# Patient Record
Sex: Female | Born: 1960
Health system: Southern US, Community
[De-identification: ages and names within clinical notes are randomized; demographics above are authoritative.]

## PROBLEM LIST (undated history)

## (undated) DIAGNOSIS — I10 Essential (primary) hypertension: Secondary | ICD-10-CM

## (undated) DIAGNOSIS — N390 Urinary tract infection, site not specified: Secondary | ICD-10-CM

## (undated) DIAGNOSIS — N649 Disorder of breast, unspecified: Secondary | ICD-10-CM

## (undated) DIAGNOSIS — F419 Anxiety disorder, unspecified: Secondary | ICD-10-CM

## (undated) DIAGNOSIS — E039 Hypothyroidism, unspecified: Secondary | ICD-10-CM

## (undated) DIAGNOSIS — R7401 Elevation of levels of liver transaminase levels: Secondary | ICD-10-CM

## (undated) DIAGNOSIS — E119 Type 2 diabetes mellitus without complications: Secondary | ICD-10-CM

## (undated) DIAGNOSIS — Z8542 Personal history of malignant neoplasm of other parts of uterus: Secondary | ICD-10-CM

## (undated) DIAGNOSIS — R740 Nonspecific elevation of levels of transaminase and lactic acid dehydrogenase [LDH]: Secondary | ICD-10-CM

## (undated) HISTORY — DX: Hypothyroidism, unspecified: E03.9

## (undated) HISTORY — DX: Elevation of levels of liver transaminase levels: R74.01

## (undated) HISTORY — DX: Type 2 diabetes mellitus without complications: E11.9

## (undated) HISTORY — PX: BREAST BIOPSY: SHX20

## (undated) HISTORY — DX: Anxiety disorder, unspecified: F41.9

## (undated) HISTORY — PX: ABDOMINAL HYSTERECTOMY: SHX81

## (undated) HISTORY — DX: Nonspecific elevation of levels of transaminase and lactic acid dehydrogenase (ldh): R74.0

## (undated) HISTORY — DX: Urinary tract infection, site not specified: N39.0

## (undated) HISTORY — DX: Essential (primary) hypertension: I10

## (undated) HISTORY — DX: Disorder of breast, unspecified: N64.9

## (undated) HISTORY — DX: Personal history of malignant neoplasm of other parts of uterus: Z85.42

---

## 2005-10-06 ENCOUNTER — Ambulatory Visit: Payer: Self-pay

## 2005-10-10 ENCOUNTER — Ambulatory Visit: Payer: Self-pay

## 2009-04-10 ENCOUNTER — Ambulatory Visit: Payer: Self-pay | Admitting: Family Medicine

## 2009-04-10 DIAGNOSIS — R259 Unspecified abnormal involuntary movements: Secondary | ICD-10-CM | POA: Insufficient documentation

## 2009-04-10 DIAGNOSIS — R251 Tremor, unspecified: Secondary | ICD-10-CM | POA: Insufficient documentation

## 2009-04-10 LAB — CONVERTED CEMR LAB
Bilirubin Urine: NEGATIVE
Casts: 0 /lpf
Glucose, Urine, Semiquant: NEGATIVE
Ketones, urine, test strip: NEGATIVE
Nitrite: NEGATIVE
Protein, U semiquant: 100
Specific Gravity, Urine: 1.01
Urine crystals, microscopic: 0 /hpf
Urobilinogen, UA: 0.2
WBC Urine, dipstick: NEGATIVE
WBC, UA: 0 cells/hpf
Yeast, UA: 0
pH: 7

## 2009-04-11 ENCOUNTER — Encounter: Payer: Self-pay | Admitting: Family Medicine

## 2009-04-13 ENCOUNTER — Encounter (INDEPENDENT_AMBULATORY_CARE_PROVIDER_SITE_OTHER): Payer: Self-pay | Admitting: *Deleted

## 2009-04-13 LAB — CONVERTED CEMR LAB
ALT: 46 units/L — ABNORMAL HIGH (ref 0–35)
AST: 46 units/L — ABNORMAL HIGH (ref 0–37)
Albumin: 3.5 g/dL (ref 3.5–5.2)
Alkaline Phosphatase: 90 units/L (ref 39–117)
BUN: 8 mg/dL (ref 6–23)
Basophils Absolute: 0.1 10*3/uL (ref 0.0–0.1)
Basophils Relative: 0.6 % (ref 0.0–3.0)
Bilirubin, Direct: 0.1 mg/dL (ref 0.0–0.3)
CO2: 31 meq/L (ref 19–32)
Calcium: 8.9 mg/dL (ref 8.4–10.5)
Chloride: 102 meq/L (ref 96–112)
Cholesterol: 197 mg/dL (ref 0–200)
Creatinine, Ser: 0.7 mg/dL (ref 0.4–1.2)
Eosinophils Absolute: 0.3 10*3/uL (ref 0.0–0.7)
Eosinophils Relative: 3.4 % (ref 0.0–5.0)
GFR calc non Af Amer: 94.07 mL/min (ref >60)
Glucose, Bld: 80 mg/dL (ref 70–99)
HCT: 41.3 % (ref 36.0–46.0)
HDL: 36.2 mg/dL — ABNORMAL LOW (ref >39.00)
Hemoglobin: 14.3 g/dL (ref 12.0–15.0)
LDL Cholesterol: 134 mg/dL — ABNORMAL HIGH (ref 0–99)
Lymphocytes Relative: 30.7 % (ref 12.0–46.0)
Lymphs Abs: 3.1 10*3/uL (ref 0.7–4.0)
MCHC: 34.7 g/dL (ref 30.0–36.0)
MCV: 91.9 fL (ref 78.0–100.0)
Monocytes Absolute: 0.6 10*3/uL (ref 0.1–1.0)
Monocytes Relative: 6.5 % (ref 3.0–12.0)
Neutro Abs: 5.9 10*3/uL (ref 1.4–7.7)
Neutrophils Relative %: 58.8 % (ref 43.0–77.0)
Platelets: 235 10*3/uL (ref 150.0–400.0)
Potassium: 4 meq/L (ref 3.5–5.1)
RBC: 4.5 M/uL (ref 3.87–5.11)
RDW: 13.3 % (ref 11.5–14.6)
Sodium: 138 meq/L (ref 135–145)
TSH: 25.39 microintl units/mL — ABNORMAL HIGH (ref 0.35–5.50)
Total Bilirubin: 1.1 mg/dL (ref 0.3–1.2)
Total CHOL/HDL Ratio: 5
Total Protein: 7.8 g/dL (ref 6.0–8.3)
Triglycerides: 136 mg/dL (ref 0.0–149.0)
VLDL: 27.2 mg/dL (ref 0.0–40.0)
WBC: 10 10*3/uL (ref 4.5–10.5)

## 2009-05-16 ENCOUNTER — Ambulatory Visit: Payer: Self-pay | Admitting: Family Medicine

## 2009-05-16 DIAGNOSIS — R7401 Elevation of levels of liver transaminase levels: Secondary | ICD-10-CM | POA: Insufficient documentation

## 2009-05-16 DIAGNOSIS — R7402 Elevation of levels of lactic acid dehydrogenase (LDH): Secondary | ICD-10-CM | POA: Insufficient documentation

## 2009-05-16 DIAGNOSIS — R740 Nonspecific elevation of levels of transaminase and lactic acid dehydrogenase [LDH]: Secondary | ICD-10-CM

## 2009-05-16 DIAGNOSIS — E039 Hypothyroidism, unspecified: Secondary | ICD-10-CM | POA: Insufficient documentation

## 2009-05-22 ENCOUNTER — Encounter (INDEPENDENT_AMBULATORY_CARE_PROVIDER_SITE_OTHER): Payer: Self-pay | Admitting: *Deleted

## 2009-05-22 LAB — CONVERTED CEMR LAB
ALT: 50 units/L — ABNORMAL HIGH (ref 0–35)
AST: 40 units/L — ABNORMAL HIGH (ref 0–37)
Free T4: 0.9 ng/dL (ref 0.6–1.6)
TSH: 9.64 microintl units/mL — ABNORMAL HIGH (ref 0.35–5.50)

## 2009-06-19 ENCOUNTER — Encounter: Payer: Self-pay | Admitting: Family Medicine

## 2009-06-19 ENCOUNTER — Ambulatory Visit: Payer: Self-pay | Admitting: Family Medicine

## 2009-06-20 ENCOUNTER — Encounter: Payer: Self-pay | Admitting: Family Medicine

## 2009-06-21 ENCOUNTER — Encounter: Payer: Self-pay | Admitting: Family Medicine

## 2009-06-21 ENCOUNTER — Ambulatory Visit: Payer: Self-pay | Admitting: Family Medicine

## 2009-06-27 ENCOUNTER — Telehealth: Payer: Self-pay | Admitting: Family Medicine

## 2009-06-28 ENCOUNTER — Ambulatory Visit: Payer: Self-pay | Admitting: Family Medicine

## 2009-06-29 LAB — CONVERTED CEMR LAB
ALT: 48 units/L — ABNORMAL HIGH (ref 0–35)
AST: 37 units/L (ref 0–37)
Albumin: 3.4 g/dL — ABNORMAL LOW (ref 3.5–5.2)
Alkaline Phosphatase: 76 units/L (ref 39–117)
Bilirubin, Direct: 0.1 mg/dL (ref 0.0–0.3)
Free T4: 1.1 ng/dL (ref 0.6–1.6)
TSH: 4.73 microintl units/mL (ref 0.35–5.50)
Total Bilirubin: 0.8 mg/dL (ref 0.3–1.2)
Total Protein: 7.5 g/dL (ref 6.0–8.3)

## 2009-07-02 ENCOUNTER — Encounter: Admission: RE | Admit: 2009-07-02 | Discharge: 2009-07-02 | Payer: Self-pay | Admitting: Family Medicine

## 2009-07-02 LAB — HM MAMMOGRAPHY

## 2009-07-12 ENCOUNTER — Other Ambulatory Visit: Admission: RE | Admit: 2009-07-12 | Discharge: 2009-07-12 | Payer: Self-pay | Admitting: Family Medicine

## 2009-07-12 ENCOUNTER — Ambulatory Visit: Payer: Self-pay | Admitting: Family Medicine

## 2009-07-12 DIAGNOSIS — I10 Essential (primary) hypertension: Secondary | ICD-10-CM | POA: Insufficient documentation

## 2009-07-12 LAB — HM PAP SMEAR

## 2009-07-23 ENCOUNTER — Encounter (INDEPENDENT_AMBULATORY_CARE_PROVIDER_SITE_OTHER): Payer: Self-pay | Admitting: *Deleted

## 2009-07-23 LAB — CONVERTED CEMR LAB: Pap Smear: NORMAL

## 2009-08-09 ENCOUNTER — Encounter: Admission: RE | Admit: 2009-08-09 | Discharge: 2009-08-09 | Payer: Self-pay | Admitting: Surgery

## 2009-08-09 ENCOUNTER — Ambulatory Visit (HOSPITAL_BASED_OUTPATIENT_CLINIC_OR_DEPARTMENT_OTHER): Admission: RE | Admit: 2009-08-09 | Discharge: 2009-08-09 | Payer: Self-pay | Admitting: Surgery

## 2009-08-23 ENCOUNTER — Ambulatory Visit: Payer: Self-pay | Admitting: Family Medicine

## 2010-01-10 ENCOUNTER — Ambulatory Visit: Payer: Self-pay | Admitting: Family Medicine

## 2010-01-11 LAB — CONVERTED CEMR LAB
ALT: 38 units/L — ABNORMAL HIGH (ref 0–35)
AST: 30 units/L (ref 0–37)
Albumin: 3.8 g/dL (ref 3.5–5.2)
Alkaline Phosphatase: 78 units/L (ref 39–117)
BUN: 13 mg/dL (ref 6–23)
Basophils Absolute: 0.1 10*3/uL (ref 0.0–0.1)
Basophils Relative: 0.6 % (ref 0.0–3.0)
Bilirubin, Direct: 0.2 mg/dL (ref 0.0–0.3)
CO2: 30 meq/L (ref 19–32)
Calcium: 9.4 mg/dL (ref 8.4–10.5)
Chloride: 103 meq/L (ref 96–112)
Cholesterol: 183 mg/dL (ref 0–200)
Creatinine, Ser: 0.8 mg/dL (ref 0.4–1.2)
Eosinophils Absolute: 0.4 10*3/uL (ref 0.0–0.7)
Eosinophils Relative: 3.1 % (ref 0.0–5.0)
GFR calc non Af Amer: 81.56 mL/min (ref >60)
Glucose, Bld: 96 mg/dL (ref 70–99)
HCT: 40.5 % (ref 36.0–46.0)
HDL: 41 mg/dL (ref >39.00)
Hemoglobin: 14 g/dL (ref 12.0–15.0)
LDL Cholesterol: 113 mg/dL — ABNORMAL HIGH (ref 0–99)
Lymphocytes Relative: 31.9 % (ref 12.0–46.0)
Lymphs Abs: 3.6 10*3/uL (ref 0.7–4.0)
MCHC: 34.6 g/dL (ref 30.0–36.0)
MCV: 91.6 fL (ref 78.0–100.0)
Monocytes Absolute: 0.8 10*3/uL (ref 0.1–1.0)
Monocytes Relative: 6.9 % (ref 3.0–12.0)
Neutro Abs: 6.5 10*3/uL (ref 1.4–7.7)
Neutrophils Relative %: 57.5 % (ref 43.0–77.0)
Platelets: 214 10*3/uL (ref 150.0–400.0)
Potassium: 4.1 meq/L (ref 3.5–5.1)
RBC: 4.43 M/uL (ref 3.87–5.11)
RDW: 14.5 % (ref 11.5–14.6)
Sodium: 140 meq/L (ref 135–145)
TSH: 45.52 microintl units/mL — ABNORMAL HIGH (ref 0.35–5.50)
Total Bilirubin: 0.9 mg/dL (ref 0.3–1.2)
Total CHOL/HDL Ratio: 4
Total Protein: 7.5 g/dL (ref 6.0–8.3)
Triglycerides: 145 mg/dL (ref 0.0–149.0)
VLDL: 29 mg/dL (ref 0.0–40.0)
WBC: 11.3 10*3/uL — ABNORMAL HIGH (ref 4.5–10.5)

## 2010-01-15 ENCOUNTER — Encounter: Payer: Self-pay | Admitting: Family Medicine

## 2010-01-23 ENCOUNTER — Ambulatory Visit: Payer: Self-pay | Admitting: Family Medicine

## 2010-01-23 DIAGNOSIS — D72829 Elevated white blood cell count, unspecified: Secondary | ICD-10-CM | POA: Insufficient documentation

## 2010-03-06 ENCOUNTER — Ambulatory Visit: Payer: Self-pay | Admitting: Family Medicine

## 2010-03-07 LAB — CONVERTED CEMR LAB
Basophils Absolute: 0.1 10*3/uL (ref 0.0–0.1)
Basophils Relative: 1.4 % (ref 0.0–3.0)
Eosinophils Absolute: 0.3 10*3/uL (ref 0.0–0.7)
Eosinophils Relative: 2.8 % (ref 0.0–5.0)
Free T4: 0.92 ng/dL (ref 0.60–1.60)
HCT: 40 % (ref 36.0–46.0)
Hemoglobin: 13.8 g/dL (ref 12.0–15.0)
Lymphocytes Relative: 33.7 % (ref 12.0–46.0)
Lymphs Abs: 3 10*3/uL (ref 0.7–4.0)
MCHC: 34.5 g/dL (ref 30.0–36.0)
MCV: 92.5 fL (ref 78.0–100.0)
Monocytes Absolute: 0.6 10*3/uL (ref 0.1–1.0)
Monocytes Relative: 7.2 % (ref 3.0–12.0)
Neutro Abs: 4.9 10*3/uL (ref 1.4–7.7)
Neutrophils Relative %: 54.9 % (ref 43.0–77.0)
Platelets: 230 10*3/uL (ref 150.0–400.0)
RBC: 4.32 M/uL (ref 3.87–5.11)
RDW: 14.1 % (ref 11.5–14.6)
TSH: 14.78 microintl units/mL — ABNORMAL HIGH (ref 0.35–5.50)
WBC: 8.9 10*3/uL (ref 4.5–10.5)

## 2010-04-19 ENCOUNTER — Ambulatory Visit: Payer: Self-pay | Admitting: Family Medicine

## 2010-04-21 LAB — CONVERTED CEMR LAB: TSH: 11 microintl units/mL — ABNORMAL HIGH (ref 0.35–5.50)

## 2010-07-29 ENCOUNTER — Other Ambulatory Visit: Payer: Self-pay | Admitting: Family Medicine

## 2010-07-29 ENCOUNTER — Ambulatory Visit
Admission: RE | Admit: 2010-07-29 | Discharge: 2010-07-29 | Payer: Self-pay | Source: Home / Self Care | Attending: Family Medicine | Admitting: Family Medicine

## 2010-07-29 LAB — T4, FREE: Free T4: 0.9 ng/dL (ref 0.60–1.60)

## 2010-07-29 LAB — TSH: TSH: 10.46 u[IU]/mL — ABNORMAL HIGH (ref 0.35–5.50)

## 2010-08-20 NOTE — Assessment & Plan Note (Signed)
Summary: 6WK F/U FOR BLOOD PRESSURE CHECK / LFW   Vital Signs:  Patient profile:   50 year old female Weight:      238 pounds BMI:     43.69 Temp:     98.1 degrees F oral Pulse rate:   64 / minute Pulse rhythm:   regular BP sitting:   108 / 70  (right arm) Cuff size:   large  Vitals Entered By: Lowella Petties CMA (August 23, 2009 8:24 AM) CC: 6 week follow up   History of Present Illness: here for f/u HTN on lisinopril   bp great today 108/70-- much improved  feels fine   had her breast surgery- benign and glad to have it done  will be able to start exercising again tomorrow not too much pain  no side eff from med is really watching salt in diet   wt down 1 lb bmi 43   no more caffine her tremor is about the same - it does not bother her too much     Allergies: No Known Drug Allergies  Past History:  Family History: Last updated: 05/16/2009 Family History Diabetes (other relative) Family History Hypertension (parent, other relative) mother with hypothyroid   Social History: Last updated: 04/10/2009 Occupation:Shift Manager- at [employer]  Married 2 sons - one is in English as a second language teacher gaurd  2 grandsons  had a daughter - who lived for 6 weeks after birth (due to trauma when she was pregnant with her)  non smoker - briefly smoked at 69  occasional alcohol   Past Medical History: Hx of uti's anxiety  benign breast lesion  Past Surgical History: 2 CS  breast bx and cyst removal 2011- benign  Review of Systems General:  Denies fatigue, loss of appetite, and malaise. Eyes:  Denies blurring and eye pain. CV:  Denies chest pain or discomfort, lightheadness, and palpitations. Resp:  Denies cough, shortness of breath, and wheezing. GI:  Denies abdominal pain, change in bowel habits, and indigestion. GU:  Denies urinary frequency. MS:  Denies joint pain, joint redness, and joint swelling. Derm:  Denies lesion(s), poor wound healing, and rash. Neuro:  Denies  numbness and tingling. Endo:  Denies cold intolerance, excessive thirst, excessive urination, and heat intolerance.  Physical Exam  General:  obese and generally well appearing Head:  normocephalic, atraumatic, and no abnormalities observed.   Eyes:  vision grossly intact, pupils equal, pupils round, and pupils reactive to light.  no conjunctival pallor, injection or icterus  Neck:  supple with full rom and no masses or thyromegally, no JVD or carotid bruit  Lungs:  Normal respiratory effort, chest expands symmetrically. Lungs are clear to auscultation, no crackles or wheezes. Heart:  Normal rate and regular rhythm. S1 and S2 normal without gallop, murmur, click, rub or other extra sounds. Abdomen:  no renal bruits  Msk:  No deformity or scoliosis noted of thoracic or lumbar spine.  no acute joint change  Extremities:  No clubbing, cyanosis, edema, or deformity noted with normal full range of motion of all joints.   Neurologic:  sensation intact to light touch, gait normal, and DTRs symmetrical and normal.   Skin:  Intact without suspicious lesions or rashes lentigos diffusely  Cervical Nodes:  No lymphadenopathy noted Psych:  normal affect, talkative and pleasant    Impression & Recommendations:  Problem # 1:  HYPERTENSION (ICD-401.9) Assessment Improved  much imp with zestril and lifstye change adv to continue both and work on wt loss f/u  6 mo  Her updated medication list for this problem includes:    Zestril 10 Mg Tabs (Lisinopril) .Marland Kitchen... 1 by mouth once daily  BP today: 108/70 Prior BP: 158/100 (07/12/2009)  Labs Reviewed: K+: 4.0 (04/10/2009) Creat: : 0.7 (04/10/2009)   Chol: 197 (04/10/2009)   HDL: 36.20 (04/10/2009)   LDL: 134 (04/10/2009)   TG: 136.0 (04/10/2009)  Complete Medication List: 1)  Synthroid 75 Mcg Tabs (Levothyroxine sodium) .... Take one by mouth daily 2)  Zestril 10 Mg Tabs (Lisinopril) .Marland Kitchen.. 1 by mouth once daily  Patient Instructions: 1)  keep  working on healthy diet and exercise  2)  continue current medicines  3)  follow up with me in july   Prior Medications (reviewed today): SYNTHROID 75 MCG TABS (LEVOTHYROXINE SODIUM) take one by mouth daily ZESTRIL 10 MG TABS (LISINOPRIL) 1 by mouth once daily Current Allergies: No known allergies
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
## 2010-08-20 NOTE — Assessment & Plan Note (Signed)
Summary: F/U   Vital Signs:  Patient profile:   50 year old female Height:      62 inches Weight:      240 pounds BMI:     44.06 Temp:     97.9 degrees F oral Pulse rate:   68 / minute Pulse rhythm:   regular BP sitting:   128 / 86  (left arm) Cuff size:   large  Vitals Entered By: Lewanda Rife LPN (January 23, 453 8:51 AM) CC: follow up visit   History of Present Illness: is visiting father in a rest home after a stroke -- is stressful   in vacation this week feeling pretty good in general   bp is 128/86 and wt is stable   thyroid -- does not feel much of a change -- is a little less hungry than she was  brought her chol down - more veg and less meat  no red or fatty meat  LDL down to 113   wbc is up one point coughs occas - gets a tickle no fever no urine symptoms  ? 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 Eyes:  Denies blurring and eye irritation. CV:  Denies chest pain or discomfort, lightheadness,  palpitations, shortness of breath with exertion, and swelling of feet. Resp:  Denies cough, shortness of breath, and wheezing. GI:  Denies abdominal pain, change in bowel habits, and indigestion. GU:  Denies dysuria and urinary frequency. MS:  Denies muscle aches and cramps. Derm:  Denies itching, lesion(s), poor wound healing, and rash. Neuro:  Denies numbness, tingling, and weakness. Endo:  Denies cold intolerance, excessive thirst, excessive urination, and heat intolerance. Heme:  Denies abnormal bruising and bleeding.  Physical Exam  General:  obese and generally well appearing Head:  normocephalic, atraumatic, and no abnormalities observed.   Eyes:  vision grossly intact, pupils equal, pupils round, and pupils reactive to light.  no conjunctival pallor, injection or icterus  Ears:  R ear normal and L ear normal.   Nose:  no nasal discharge.   Mouth:  pharynx pink and moist.   Neck:  supple with full rom and no masses or thyromegally, no JVD or carotid bruit  Lungs:  Normal respiratory effort, chest expands symmetrically. Lungs are clear to auscultation, no crackles or wheezes. Heart:  Normal rate and regular rhythm. S1 and S2 normal without gallop, murmur, click, rub or other extra sounds. Msk:  No deformity or scoliosis noted of thoracic or lumbar spine.  no acute joint change    Impression &  Recommendations:  Problem # 1:  HYPERTENSION (ICD-401.9) Assessment Unchanged  this is well controlled with zestril -- continue that and work on diet and exercise for wt loss  reviewed labs today f/u 6 mo  Her updated medication list for this problem includes:    Zestril 10 Mg Tabs (Lisinopril) .Marland Kitchen... 1 by mouth once daily  BP today: 128/86 Prior BP: 108/70 (08/23/2009)  Labs Reviewed: K+: 4.1 (01/10/2010) Creat: : 0.8 (01/10/2010)   Chol: 183 (01/10/2010)   HDL: 41.00 (01/10/2010)   LDL: 113 (01/10/2010)   TG: 145.0 (01/10/2010)  Problem # 2:  TRANSAMINASES, SERUM, ELEVATED  (ICD-790.4) this is stable stressed imp of wt loss  Problem # 3:  HYPOTHYROIDISM (ICD-244.9) Assessment: Improved  clinically imp with inc dose  sched labs 6 weeks fu 6 mo  Her updated medication list for this problem includes:    Synthroid 100 Mcg Tabs (Levothyroxine sodium) .Marland Kitchen... Take 1 tablet by mouth once a day  Labs Reviewed: TSH: 45.52 (01/10/2010)    Chol: 183 (01/10/2010)   HDL: 41.00 (01/10/2010)   LDL: 113 (01/10/2010)   TG: 145.0 (01/10/2010)  Problem # 4:  SCREENING FOR LIPOID DISORDERS (ICD-V77.91) Assessment: Unchanged lipids are imp with better diet- commended on this  rev low sat fat diet   Problem # 5:  LEUKOCYTOSIS (ICD-288.60) Assessment: New very mild without symptoms  may be from stress of father with cva  adv to call if fever or any illness at all  re check 6 wk  Complete Medication List: 1)  Zestril 10 Mg Tabs (Lisinopril) .Marland Kitchen.. 1 by mouth once daily 2)  Synthroid 100 Mcg Tabs (Levothyroxine sodium) .... Take 1 tablet by mouth once a day  Patient Instructions: 1)  continue current meds  2)  schedule non fasting labs in 6 weeks tsh/ free T4 and cbc with diff for 244.9 and leukocytosis  3)  follow up in 6 months  4)  work on healthy diet and exercise   Current Allergies (reviewed today): No known allergies

## 2010-08-20 NOTE — Miscellaneous (Signed)
Summary: med list update  Clinical Lists Changes  Medications: Added new medication of SYNTHROID 50 MCG TABS (LEVOTHYROXINE SODIUM) take one by mouth daily     Prior Medications: Current Allergies: No known allergies

## 2010-08-20 NOTE — Miscellaneous (Signed)
Summary: med list update  Clinical Lists Changes  Medications: Changed medication from SYNTHROID 50 MCG TABS (LEVOTHYROXINE SODIUM) take one by mouth daily to SYNTHROID 75 MCG TABS (LEVOTHYROXINE SODIUM) take one by mouth daily     Prior Medications: Current Allergies: No known allergies

## 2010-08-20 NOTE — Miscellaneous (Signed)
Summary: pap results  Clinical Lists Changes  Observations: Added new observation of PAP SMEAR: normal (07/23/2009 8:19)      Preventive Care Screening  Pap Smear:    Date:  07/23/2009    Results:  normal

## 2010-08-20 NOTE — Assessment & Plan Note (Signed)
Summary: 6 week follow upr/bh   Vital Signs:  Patient profile:   50 year old female Weight:      238 pounds BMI:     43.69 Temp:     97.9 degrees F oral Pulse rate:   72 / minute Pulse rhythm:   regular BP sitting:   142 / 100  (left arm) Cuff size:   large  Vitals Entered By: Lowella Petties CMA (June 28, 2009 9:08 AM)  Serial Vital Signs/Assessments:  Time      Position  BP       Pulse  Resp  Temp     By                     130/85                         Judith Part MD  CC: 6 week follow up   History of Present Illness: here for 6 week f/u  at last visit did inc her synthroid for elevated tsh  does not feel any different   wt is down another 3 lb today is still really working on that  is eating healthy  also getting on her elliptical -- is 20 min every other day , and also working very long hours lately    need to re check ast /alt for fatty liver today  has breast bx coming up on monday for ? 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 Eyes:  Denies blurring and eye pain. CV:  Denies chest pain or discomfort, lightheadness, palpitations, and shortness of breath with exertion. Resp:  Denies cough and wheezing. GI:  Denies abdominal pain, change in bowel  habits, and indigestion. MS:  Denies joint pain. Derm:  Denies dryness, itching, lesion(s), poor wound healing, and rash. Neuro:  Denies numbness, tingling, tremors, and weakness. Psych:  mood is ok . Endo:  Denies cold intolerance, excessive thirst, excessive urination, and heat intolerance.  Physical Exam  General:  obese and generally well appearing Head:  normocephalic, atraumatic, and no abnormalities observed.   Eyes:  vision grossly intact, pupils equal, pupils round, and pupils reactive to light.  no conjunctival pallor, injection or icterus  Neck:  supple with full rom and no masses or thyromegally, no JVD or carotid bruit  Lungs:  Normal respiratory effort, chest expands symmetrically. Lungs are clear to auscultation, no crackles or wheezes. Heart:  Normal rate and regular rhythm. S1 and S2 normal without gallop, murmur, click, rub or other extra sounds. Msk:  No deformity or scoliosis noted of thoracic or lumbar spine.  no acute joint changes  Extremities:  No clubbing, cyanosis, edema, or deformity noted with normal full range of motion of  all joints.   Neurologic:  sensation intact to light touch, gait normal, and DTRs symmetrical and normal.  no tremor noted today Skin:  Intact without suspicious lesions or rashes Cervical Nodes:  No lymphadenopathy noted Psych:  normal affect, talkative and pleasant    Impression & Recommendations:  Problem # 1:  HYPOTHYROIDISM (ICD-244.9) Assessment Unchanged  with no clinical changes on inc dose  tsh / Ft4 today and update  Her updated medication list for this problem includes:    Synthroid 75 Mcg Tabs (Levothyroxine sodium) .Marland Kitchen... Take one by mouth daily  Orders: Venipuncture (04540) TLB-TSH (Thyroid Stimulating Hormone) (84443-TSH) TLB-T4 (Thyrox), Free [id number])  Labs Reviewed: TSH: 9.64 (05/16/2009)    Chol: 197 (04/10/2009)   HDL: 36.20 (04/10/2009)   LDL: 134 (04/10/2009)   TG: 136.0 (04/10/2009)  Problem # 2:   MAMMOGRAM, ABNORMAL (ICD-793.80) Assessment: Comment Only for bx on monday- pt is doing ok and has good support no palp abn  will disc at f/u  Problem # 3:  TRANSAMINASES, SERUM, ELEVATED (ICD-790.4) Assessment: Unchanged from fatty liver 3 more pounds lost today urged to keep up low fat diet and exercise  hepatic lab today and udate to disc further at upcoming f/u Orders: Venipuncture (29562) TLB-Hepatic/Liver Function Pnl (80076-HEPATIC)  Complete Medication List: 1)  Synthroid 75 Mcg Tabs (Levothyroxine sodium) .... Take one by mouth daily  Patient Instructions: 1)  labs today for thyroid and also liver function  2)  keep working on diet and exercise and weight loss  3)  good luck with your breast biopsy - I think you will do great   Prior Medications (reviewed today): SYNTHROID 75 MCG TABS (LEVOTHYROXINE SODIUM) take one by mouth daily Current Allergies: No known allergies

## 2010-08-20 NOTE — Assessment & Plan Note (Signed)
Summary: 30 MIN APPT 2-4 WEEK FOLLOW UP/[facility]   Vital Signs:  Patient profile:   50 year old female Weight:      241 pounds BMI:     44.24 Temp:     97.9 degrees F oral Pulse rate:   64 / minute Pulse rhythm:   regular BP sitting:   144 / 84  (left arm) Cuff size:   large  Vitals Entered By: Lowella Petties CMA (May 16, 2009 11:35 AM)  Serial Vital Signs/Assessments:  Time      Position  BP       Pulse  Resp  Temp     By                     130/80                         Judith Part MD  Comments: large cuff By: Judith Part MD   CC: 2-4 month follow up  9  History of Present Illness: here for f/u and health mt exam  feeling pretty good   wt is down 3 lb- is really watching diet  is also off sugar drinks and has more water   last labs showed hypothyroidism- with elevated tsh  started synthroid 50 micrograms overall feels like she has more energy tremor is also a little better  wt is down a bit   bp 144/84 today- up some   tx for uti last visit with septra urine symptoms - are totally gone now  no problems with abx   lipids very mildly high with trig 136/ HDL 36 and LDL 134 diet is overall improved  has been working on elliptical machine -- and enjoys it  ast/alt were elevated slt  no tylenol takes occ aleve - not often  occ alcohol- not regular   mammogram- just had one last year -- is due for another  has not noticed any breast lumps   pap- long time ago -- wants to do that at a different time  menses- are infrequent at this point - almost menopausal   Td-- was 2 years ago  flu shot - declines- "makes her sick"-- vomiting   colon cancer screen - not ready for colonoscopy yet  also does not want to do stool card     Allergies: No Known Drug Allergies  Past History:  Past Medical History: Last updated: 04/10/2009 Hx of uti's anxiety   Past Surgical History: Last updated: 04/10/2009 2 CS   Family History: Last updated:  05/16/2009 Family History Diabetes (other relative) Family History Hypertension (parent, other relative) mother with hypothyroid   Social History: Last updated: 04/10/2009 Occupation:Shift Manager- at [employer]  Married 2 sons - one is in English as a second language teacher gaurd  2 grandsons  had a daughter - who lived for 6 weeks after birth (due to trauma when she was pregnant with her)  non smoker - briefly smoked at 55  occasional alcohol   Family History: Family History Diabetes (other relative) Family History Hypertension (parent, other relative) mother with hypothyroid   Review of Systems General:  Complains of fatigue; denies loss of appetite and malaise; fatigue is improved . Eyes:  Denies blurring and eye pain. CV:  Denies chest pain or discomfort, palpitations, and shortness of breath with exertion. Resp:  Denies cough, shortness of breath, and wheezing. GI:  Denies abdominal pain, change in bowel habits, nausea, and vomiting. GU:  Denies abnormal vaginal bleeding, discharge, dysuria, and urinary frequency. MS:  Denies joint pain, cramps, muscle weakness, and stiffness. Derm:  Denies itching, lesion(s), poor wound healing, and rash. Neuro:  Denies numbness and tingling. Psych:  mood is good . Endo:  Denies cold intolerance, excessive thirst, excessive urination, and heat intolerance. Heme:  Denies abnormal bruising and bleeding.  Physical Exam  General:  obese and generally well appearing Head:  normocephalic, atraumatic, and no abnormalities observed.   Eyes:  vision grossly intact, pupils equal, pupils round, and pupils reactive to light.  no conjunctival pallor, injection or icterus  Ears:  R ear normal and L ear normal.   Nose:  no nasal discharge.   Mouth:  pharynx pink and moist.   Neck:  supple with full rom and no masses or thyromegally, no JVD or carotid bruit  Chest Wall:  No deformities, masses, or tenderness noted. Breasts:  No mass, nodules, thickening, tenderness, bulging,  retraction, inflamation, nipple discharge or skin changes noted.   Lungs:  Normal respiratory effort, chest expands symmetrically. Lungs are clear to auscultation, no crackles or wheezes. Heart:  Normal rate and regular rhythm. S1 and S2 normal without gallop, murmur, click, rub or other extra sounds. Abdomen:  Bowel sounds positive,abdomen soft and non-tender without masses, organomegaly or hernias noted. no renal bruits  Msk:  No deformity or scoliosis noted of thoracic or lumbar spine.  no acute joint changes  Pulses:  R and L carotid,radial,femoral,dorsalis pedis and posterior tibial pulses are full and equal bilaterally Extremities:  No clubbing, cyanosis, edema, or deformity noted with normal full range of motion of all joints.   Neurologic:  sensation intact to light touch, gait normal, and DTRs symmetrical and normal.  no tremor noted today Skin:  Intact without suspicious lesions or rashes many sks and skin tags  Cervical Nodes:  No lymphadenopathy noted Axillary Nodes:  No palpable lymphadenopathy Inguinal Nodes:  No significant adenopathy Psych:  normal affect, talkative and pleasant    Impression & Recommendations:  Problem # 1:  HEALTH MAINTENANCE EXAM (ICD-V70.0) Assessment Comment Only reviewed health habits including diet, exercise and skin cancer prevention reviewed health maintenance list and family history commended on beginning wt loss effort rev labs and lipids  breast exam today  pt chooses to def pap for another time  Problem # 2:  TRANSAMINASES, SERUM, ELEVATED (ICD-790.4) Assessment: New mild - likely rel to wt gain/ poss fatty liver re check today with better diet and 3 lb wt loss if inc - consider [place]  disc avoid otc med and alcohol Orders: Venipuncture (63875) TLB-ALT (SGPT) (84460-ALT) TLB-AST (SGOT) (84450-SGOT)  Problem # 3:  HYPOTHYROIDISM (ICD-244.9) Assessment: New new- and improved energy level with small dose of synthroid re check today and  adj dose as needed  no goiter - will continue to follow Orders: Radiology Referral (Radiology)  Her updated medication list for this problem includes:    Synthroid 50 Mcg Tabs (Levothyroxine sodium) .Marland Kitchen... Take one by mouth daily  Problem # 4:  OTHER SCREENING MAMMOGRAM (ICD-V76.12) Assessment: Comment Only annual mammogram scheduled adv pt to continue regular self breast exams non remarkable breast exam today  Orders: Radiology Referral (Radiology)  Complete Medication List: 1)  Synthroid 50 Mcg Tabs (Levothyroxine sodium) .... Take one by mouth daily  Other Orders: TLB-TSH (Thyroid Stimulating Hormone) (84443-TSH) TLB-T4 (Thyrox), Free [phone])  Preventive Care Screening  Last Tetanus Booster:    Date:  04/21/2007    Results:  Td  Contraindications of Treatment or Deferment of Test/Procedure:    Treatment: Flu Shot    Contraindication: adverse rxn/side effects     Test/Procedure: Colonoscopy    Reason for deferment: patient declined     flu shot makes her sick  declines colonoscopy- may consider later, also declines stool card    Patient Instructions: 1)  labs today for thyroid- will let you know if we need to adjust dose  2)  also re checking liver tests -- keep working on weight loss and low fat diet  3)  we will schedule mammogram at check out    Preventive Care Screening  Last Tetanus Booster:    Date:  04/21/2007    Results:  Td   Contraindications of Treatment or Deferment of Test/Procedure:    Treatment: Flu Shot    Contraindication: adverse rxn/side effects     Test/Procedure: Colonoscopy    Reason for deferment: patient declined     flu shot makes her sick  declines colonoscopy- may consider later, also declines stool card

## 2010-08-20 NOTE — Miscellaneous (Signed)
Summary: Synthroid rx and update to med list  Clinical Lists Changes  Medications: Added new medication of SYNTHROID 100 MCG TABS (LEVOTHYROXINE SODIUM) Take 1 tablet by mouth once a day - Signed Removed medication of SYNTHROID 75 MCG TABS (LEVOTHYROXINE SODIUM) take one by mouth daily Rx of SYNTHROID 100 MCG TABS (LEVOTHYROXINE SODIUM) Take 1 tablet by mouth once a day;  #30 x 5;  Signed;  Entered by: Lewanda Rife LPN;  Authorized by: Judith Part MD;  Method used: Electronically to [facility] [phone]*, [address] [place], [place]  [address], Ph: [phone], Fax: [phone]    Prescriptions: SYNTHROID 100 MCG TABS (LEVOTHYROXINE SODIUM) Take 1 tablet by mouth once a day  #30 x 5   Entered by:   Lewanda Rife LPN   Authorized by:   Judith Part MD   Signed by:   Lewanda Rife LPN on 30/16/0109   Method used:   Electronically to        [facility] [phone]* (retail)       [address] [place], [place]  [address]       Ph: [phone]       Fax: [phone]   RxID:   [id number]  Pt has appt with Dr Milinda Antis already scheduled on 01/23/10 at 9am. Pt will schedule f/u TSH and free T4 in 6 weeks when she comes in office on 01/23/10. Pt is aware of medication change.Lewanda Rife LPN  January 15, 2010 10:01 AM  Current Allergies: No known allergies

## 2010-08-22 NOTE — Assessment & Plan Note (Signed)
Summary: 6 MONTH FOLLOW UPR/BH   Vital Signs:  Patient profile:   50 year old female Height:      62 inches Weight:      248.25 pounds BMI:     45.57 Temp:     97.7 degrees F oral Pulse rate:   76 / minute Pulse rhythm:   regular BP sitting:   120 / 78  (left arm) Cuff size:   large  Vitals Entered By: Lewanda Rife LPN (July 29, 2010 9:15 AM) CC: six month f/u   History of Present Illness: here for f/u of hypothyroid and also HTN and also having a cough -- night or day - dry and hacky like a tickle  no other uri symptoms  from ace?    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   Allergies (verified): No Known Drug Allergies  Past History:  Past Surgical History: Last updated: 08/23/2009 2 CS  breast bx and cyst removal 12-09-09- benign  Family History: Last updated: 07/29/2010 Family History Diabetes (other relative) Family History Hypertension (parent, other relative) mother with hypothyroid and alzheimers  father with stroke , died 2009-12-09  Social History: Last updated: 04/10/2009 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 fatty liver   Family History: Family History Diabetes (other relative) Family History  Hypertension (parent, other relative) mother with hypothyroid and alzheimers  father with stroke , died 12-09-09  Review of Systems General:  Complains of fatigue; denies fever, loss of appetite, and malaise. Eyes:  Denies blurring and eye irritation. CV:  Denies chest pain or discomfort, lightheadness, and palpitations. Resp:  Complains of cough; denies pleuritic, shortness of breath, sputum productive, and wheezing. GI:  Denies abdominal pain, change in bowel habits, and indigestion. GU:  Denies urinary frequency. MS:  Denies joint pain. Derm:  Denies dryness, itching, lesion(s), and rash. Neuro:  Denies headaches, numbness, and tingling. Psych:  Denies anxiety and depression; is handling grief ok overall. Endo:  Denies cold intolerance, excessive thirst, excessive urination, and heat intolerance. Heme:  Denies abnormal bruising and bleeding.  Physical Exam  General:  obese and generally well appearing Head:  normocephalic, atraumatic, and no abnormalities observed.   Eyes:  vision grossly intact, pupils equal, pupils round, and pupils reactive to light.  no conjunctival pallor, injection or icterus  Mouth:  pharynx pink and moist.   Neck:  supple with full rom and no masses or thyromegally, no JVD or carotid bruit  Chest Wall:  No deformities, masses, or tenderness noted. Lungs:  Normal respiratory effort, chest expands  symmetrically. Lungs are clear to auscultation, no crackles or wheezes. Heart:  Normal rate and regular rhythm. S1 and S2 normal without gallop, murmur, click, rub or other extra sounds. Msk:  No deformity or scoliosis noted of thoracic or lumbar spine.  no acute joint change  Extremities:  trace pedal edema with sock line Neurologic:  sensation intact to light touch, gait normal, and DTRs symmetrical and normal.  no tremor  Skin:  Intact without suspicious lesions or rashes Cervical Nodes:  No lymphadenopathy noted Psych:  nl affect     Impression &  Recommendations:  Problem # 1:  HYPOTHYROIDISM (ICD-244.9) Assessment Unchanged  some wt gain and fatigue- but that may be grief related  check tsh and free T4 then adj dose if needed  Her updated medication list for this problem includes:    Synthroid 125 Mcg Tabs (Levothyroxine sodium) .Marland Kitchen... 1 by mouth once daily  Orders: Venipuncture (13086) TLB-TSH (Thyroid Stimulating Hormone) (84443-TSH) TLB-T4 (Thyrox), Free [phone]) Prescription Created Electronically [phone])  Labs Reviewed: TSH: 11.00 (04/19/2010)    Chol: 183 (01/10/2010)   HDL: 41.00 (01/10/2010)   LDL: 113 (01/10/2010)   TG: 145.0 (01/10/2010)  Problem # 2:  HYPERTENSION (ICD-401.9) Assessment: Deteriorated  likely ace cough change to cozaar  update if not improved  nl exam today The following medications were removed from the medication list:    Zestril 10 Mg Tabs (Lisinopril) .Marland Kitchen... 1 by mouth once daily Her updated medication list for this problem includes:    Cozaar 50 Mg Tabs (Losartan potassium) .Marland Kitchen... 1 by mouth once daily  BP today: 120/78 Prior BP: 128/86 (01/23/2010)  Labs Reviewed: K+: 4.1 (01/10/2010) Creat: : 0.8 (01/10/2010)   Chol: 183 (01/10/2010)   HDL: 41.00 (01/10/2010)   LDL: 113 (01/10/2010)   TG: 145.0 (01/10/2010)  Orders: Prescription Created Electronically [id number])  Complete Medication List: 1)  Synthroid 125 Mcg Tabs (Levothyroxine sodium) .Marland Kitchen.. 1 by mouth once daily 2)  Cozaar 50 Mg Tabs (Losartan potassium) .Marland Kitchen.. 1 by mouth once daily  Patient Instructions: 1)  stop the zestril- it may be causing cough  2)  start cozaar instead - let me know if cough does not improve  3)  checking thyroid today- will update you if we need to change dose  4)  start exercise 30 min -- sat, sun and two weekdays 5)  get back on healthy eating plan  6)  follow up in about 3 months  Prescriptions: COZAAR 50 MG TABS (LOSARTAN POTASSIUM) 1 by mouth once daily  #30 x 11   Entered and Authorized by:    Judith Part MD   Signed by:   Judith Part MD on 07/29/2010   Method used:   Electronically to        [facility] [phone]* (retail)       [address] [place], [place]  [address]       Ph: [phone]       Fax: [phone]   RxID:   [id number]    Orders Added: 1)  Venipuncture [36415] 2)  TLB-TSH (Thyroid Stimulating Hormone) [84443-TSH] 3)  TLB-T4 (Thyrox), Free [01093-AT5T] 4)  Prescription Created Electronically [[id number]] 5)  Est. Patient Level III [73220]    Current Allergies (reviewed today): No known allergies

## 2010-09-11 ENCOUNTER — Encounter: Payer: Self-pay | Admitting: Family Medicine

## 2010-09-23 ENCOUNTER — Other Ambulatory Visit: Payer: Self-pay | Admitting: Family Medicine

## 2010-09-23 ENCOUNTER — Encounter (INDEPENDENT_AMBULATORY_CARE_PROVIDER_SITE_OTHER): Payer: Self-pay | Admitting: *Deleted

## 2010-09-23 ENCOUNTER — Other Ambulatory Visit (INDEPENDENT_AMBULATORY_CARE_PROVIDER_SITE_OTHER): Payer: PRIVATE HEALTH INSURANCE

## 2010-09-23 DIAGNOSIS — E039 Hypothyroidism, unspecified: Secondary | ICD-10-CM

## 2010-09-23 LAB — TSH: TSH: 15.78 u[IU]/mL — ABNORMAL HIGH (ref 0.35–5.50)

## 2010-09-23 LAB — T4, FREE: Free T4: 0.73 ng/dL (ref 0.60–1.60)

## 2010-10-07 LAB — BASIC METABOLIC PANEL
BUN: 8 mg/dL (ref 6–23)
CO2: 31 mEq/L (ref 19–32)
Calcium: 8.9 mg/dL (ref 8.4–10.5)
Chloride: 102 mEq/L (ref 96–112)
Creatinine, Ser: 0.64 mg/dL (ref 0.4–1.2)
GFR calc Af Amer: >60 mL/min (ref >60)
GFR calc non Af Amer: >60 mL/min (ref >60)
Glucose, Bld: 155 mg/dL — ABNORMAL HIGH (ref 70–99)
Potassium: 4.3 mEq/L (ref 3.5–5.1)
Sodium: 139 mEq/L (ref 135–145)

## 2010-10-07 LAB — POCT HEMOGLOBIN-HEMACUE: Hemoglobin: 14.6 g/dL (ref 12.0–15.0)

## 2010-10-29 ENCOUNTER — Ambulatory Visit: Payer: Self-pay | Admitting: Family Medicine

## 2010-10-30 ENCOUNTER — Ambulatory Visit: Payer: Self-pay | Admitting: Family Medicine

## 2010-11-05 ENCOUNTER — Ambulatory Visit: Payer: Self-pay | Admitting: Family Medicine

## 2010-11-12 ENCOUNTER — Encounter: Payer: Self-pay | Admitting: Family Medicine

## 2010-11-12 ENCOUNTER — Ambulatory Visit (INDEPENDENT_AMBULATORY_CARE_PROVIDER_SITE_OTHER): Payer: PRIVATE HEALTH INSURANCE | Admitting: Family Medicine

## 2010-11-12 VITALS — BP 130/78 | HR 72 | Temp 97.7°F | Ht 62.0 in | Wt 249.5 lb

## 2010-11-12 DIAGNOSIS — E039 Hypothyroidism, unspecified: Secondary | ICD-10-CM

## 2010-11-12 DIAGNOSIS — E66811 Obesity, class 1: Secondary | ICD-10-CM | POA: Insufficient documentation

## 2010-11-12 DIAGNOSIS — E6609 Other obesity due to excess calories: Secondary | ICD-10-CM | POA: Insufficient documentation

## 2010-11-12 DIAGNOSIS — Z6836 Body mass index (BMI) 36.0-36.9, adult: Secondary | ICD-10-CM | POA: Insufficient documentation

## 2010-11-12 DIAGNOSIS — E669 Obesity, unspecified: Secondary | ICD-10-CM

## 2010-11-12 LAB — TSH: TSH: 12.47 u[IU]/mL — ABNORMAL HIGH (ref 0.35–5.50)

## 2010-11-12 LAB — T4, FREE: Free T4: 0.87 ng/dL (ref 0.60–1.60)

## 2010-11-12 NOTE — Patient Instructions (Signed)
Checking your thyroid today  Will get you results and let you know what you need to do with your thyroid dose Try to eat healthy diet (Avoid red meat/ fried foods/ egg yolks/ fatty breakfast meats/ butter, cheese and high fat dairy/ and shellfish  )  Try to exercise 5 days per week if you can for weight loss

## 2010-11-12 NOTE — Assessment & Plan Note (Signed)
Stressed imp of wt loss with healthier diet and exercise (even when busy or stressed)

## 2010-11-12 NOTE — Assessment & Plan Note (Signed)
Last tsh quite high Re check this with free t4 today after inc in dose Some inc in energy - no other changes clinically Will likley need to inc again  Stressed imp of compliance No goiter noted

## 2010-11-12 NOTE — Progress Notes (Signed)
  Subjective:    Patient ID: Taylor Grimes, female    DOB: May 16, 1961, 50 y.o.   MRN: [id number]  HPI Here for f/u of hypothyroidism 1 mo ago tsh was over 15 and free T4 was low -so inc her synthroid to 150 mcg daily Feels about the same  Poss a little more energy No skin or hair changes No goiter or thyroid gland change   Wt is stable High bmi of 45 Has got to "get away from food"  Works at [employer]    HTN in good control 130/78  Has been feeling pretty good  Still down -- over father's death and mother was in the hospital -- and she is doing better now   Past Medical History  Diagnosis Date  . UTI (urinary tract infection)   . Anxiety   . Breast lesion     benign  . Hypothyroid   . HTN (hypertension)   . Elevated transaminase level     ? fatty liver    Past Surgical History  Procedure Date  . Breast biopsy     benign cyst 2011    reports that she quit smoking about 31 years ago. She does not have any smokeless tobacco history on file. She reports that she drinks alcohol. Her drug history not on file. family history includes Alzheimer's disease in her mother; Hypothyroidism in her mother; and Stroke in her father. No Known Allergies      Review of Systems Review of Systems  Constitutional: Negative for fever, appetite change, fatigue and unexpected weight change.  Eyes: Negative for pain and visual disturbance.  Respiratory: Negative for cough and shortness of breath.   Cardiovascular: Negative.   Gastrointestinal: Negative for nausea, diarrhea and constipation.  Genitourinary: Negative for urgency and frequency.  Skin: Negative for pallor.  Neurological: Negative for weakness, light-headedness, numbness and headaches.  Hematological: Negative for adenopathy. Does not bruise/bleed easily.  Psychiatric/Behavioral: Negative for dysphoric mood. The patient is not nervous/anxious.          Objective:   Physical Exam  Constitutional: She appears well-developed  and well-nourished.       Obese and well appearing   HENT:  Head: Normocephalic and atraumatic.  Eyes: Conjunctivae and EOM are normal. Pupils are equal, round, and reactive to light.  Neck: Normal range of motion. Neck supple. No JVD present. No thyromegaly present.  Cardiovascular: Normal rate, regular rhythm and normal heart sounds.   Pulmonary/Chest: Effort normal and breath sounds normal. She has no wheezes.  Abdominal: Soft. Bowel sounds are normal.  Musculoskeletal: She exhibits no edema and no tenderness.  Lymphadenopathy:    She has no cervical adenopathy.  Neurological: She is alert. She has normal reflexes. She exhibits normal muscle tone. Coordination normal.       No tremor   Skin: Skin is warm and dry. No rash noted. No erythema. No pallor.  Psychiatric: She has a normal mood and affect.          Assessment & Plan:

## 2010-11-14 MED ORDER — LEVOTHYROXINE SODIUM 175 MCG PO TABS: 175.0000 ug | ORAL_TABLET | Freq: Every day | ORAL | Status: DC

## 2010-11-14 NOTE — Progress Notes (Signed)
Addended by: Roxy Manns on: 11/14/2010 03:48 PM   Modules accepted: Orders

## 2010-11-15 ENCOUNTER — Other Ambulatory Visit: Payer: Self-pay | Admitting: *Deleted

## 2010-12-26 ENCOUNTER — Other Ambulatory Visit: Payer: Self-pay | Admitting: Family Medicine

## 2010-12-26 DIAGNOSIS — E039 Hypothyroidism, unspecified: Secondary | ICD-10-CM

## 2010-12-31 ENCOUNTER — Other Ambulatory Visit (INDEPENDENT_AMBULATORY_CARE_PROVIDER_SITE_OTHER): Payer: PRIVATE HEALTH INSURANCE | Admitting: Family Medicine

## 2010-12-31 DIAGNOSIS — E039 Hypothyroidism, unspecified: Secondary | ICD-10-CM

## 2010-12-31 LAB — TSH: TSH: 4.06 u[IU]/mL (ref 0.35–5.50)

## 2011-04-08 ENCOUNTER — Other Ambulatory Visit (INDEPENDENT_AMBULATORY_CARE_PROVIDER_SITE_OTHER): Payer: PRIVATE HEALTH INSURANCE

## 2011-04-08 DIAGNOSIS — E039 Hypothyroidism, unspecified: Secondary | ICD-10-CM

## 2011-04-08 LAB — TSH: TSH: 9.36 u[IU]/mL — ABNORMAL HIGH (ref 0.35–5.50)

## 2011-04-15 ENCOUNTER — Telehealth: Payer: Self-pay

## 2011-04-15 NOTE — Telephone Encounter (Signed)
Left vm for pt to callback 

## 2011-04-15 NOTE — Progress Notes (Signed)
Quick Note:  Left v/m for pt to call back. ______

## 2011-04-15 NOTE — Telephone Encounter (Signed)
Message copied by Patience Musca on Tue Apr 15, 2011  6:03 PM ------      Message from: Roxy Manns A      Created: Mon Apr 14, 2011  8:42 PM       Be compliant with med and re check tsh in 2 weeks please for hypothyroid

## 2011-04-16 IMAGING — MG MAM DGTL SCREENING MAMMO W/CAD
1 series · 4 of 4 positions shown · non-contrast
Comparison: none

REASON FOR EXAM: scr mammo
COMMENTS:

[R CC · right · 4 of 7 slices shown]
[im 1/7]
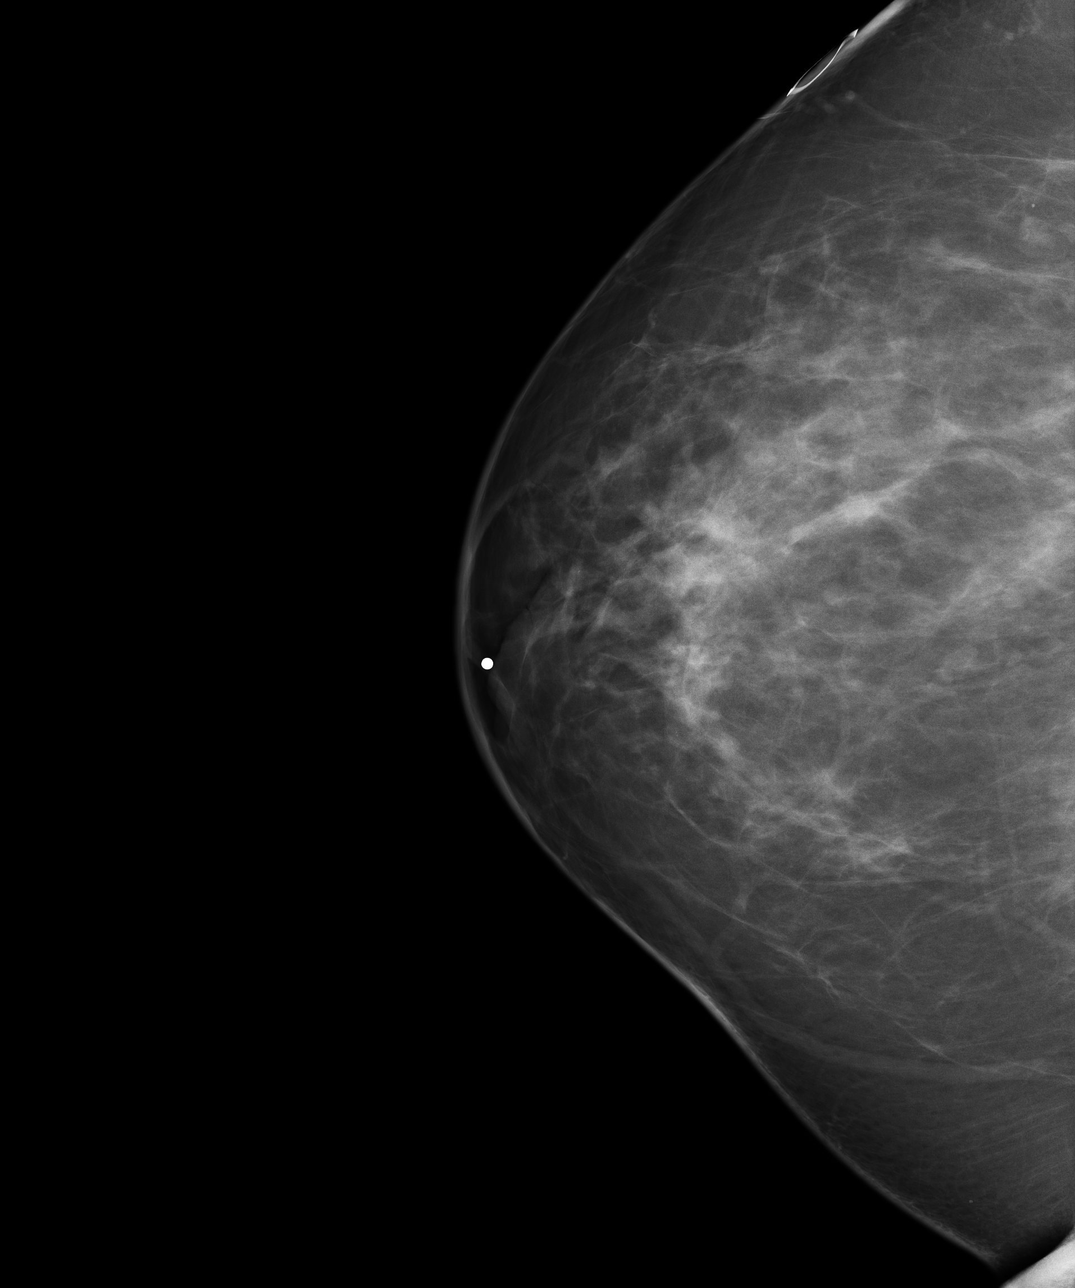
[im 3/7]
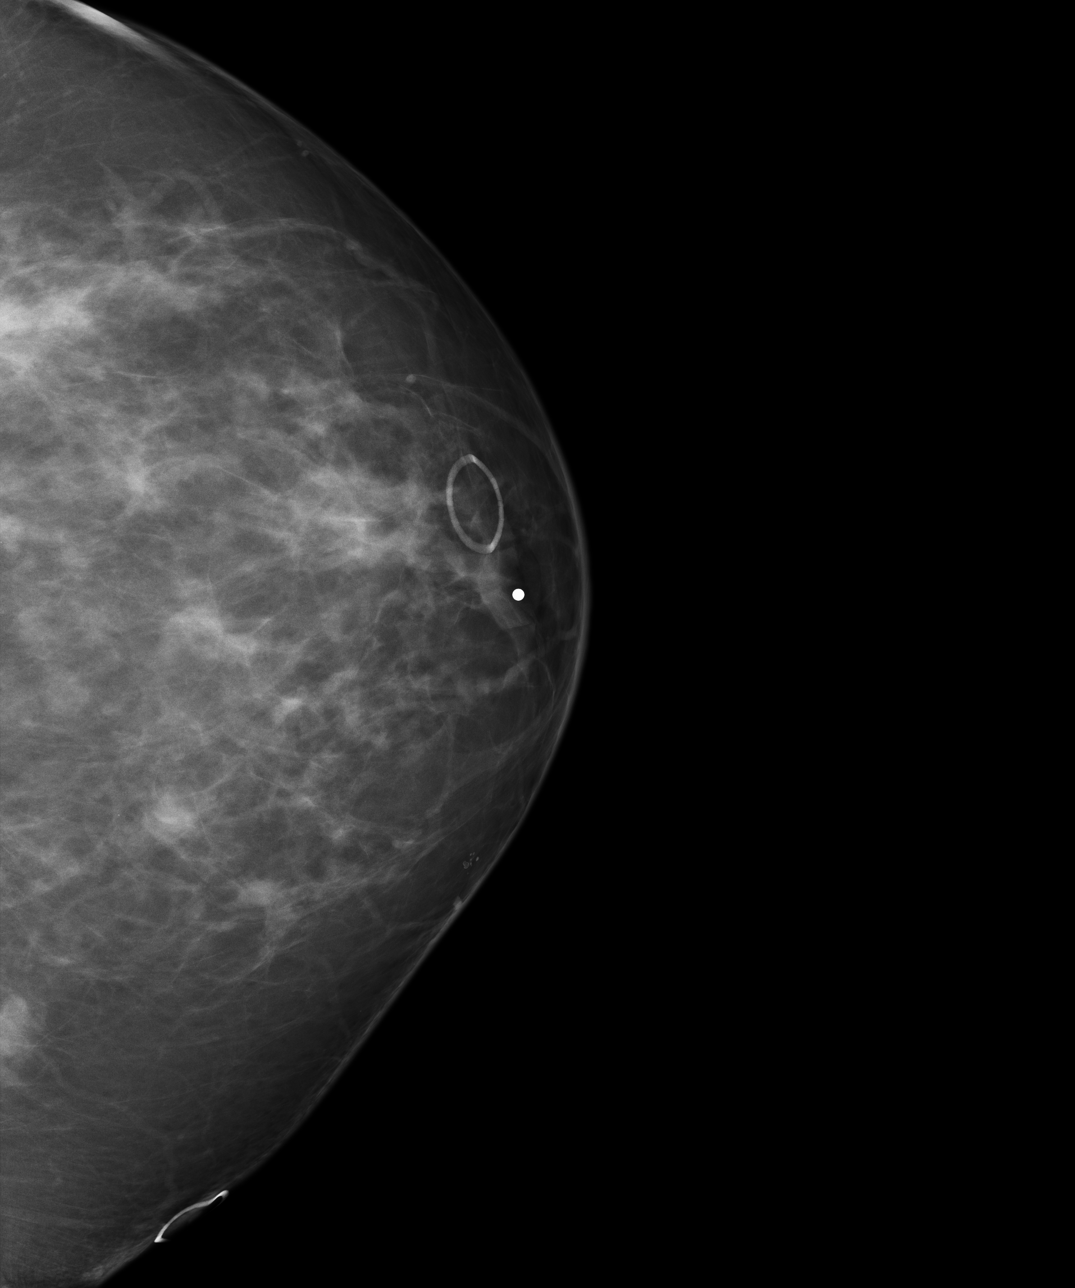
[im 5/7]
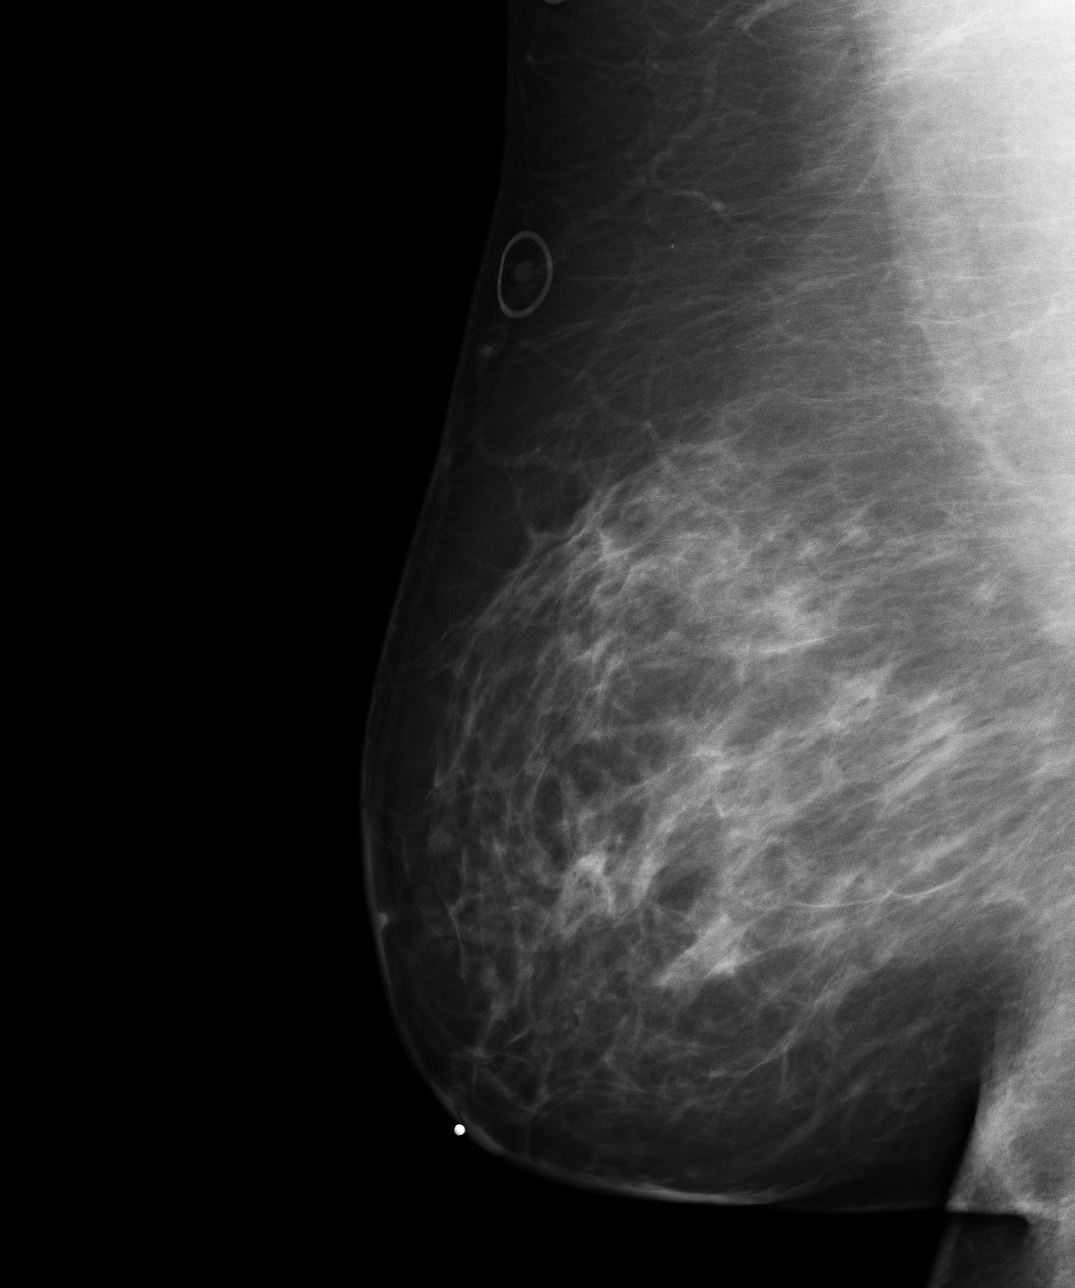
[im 7/7]
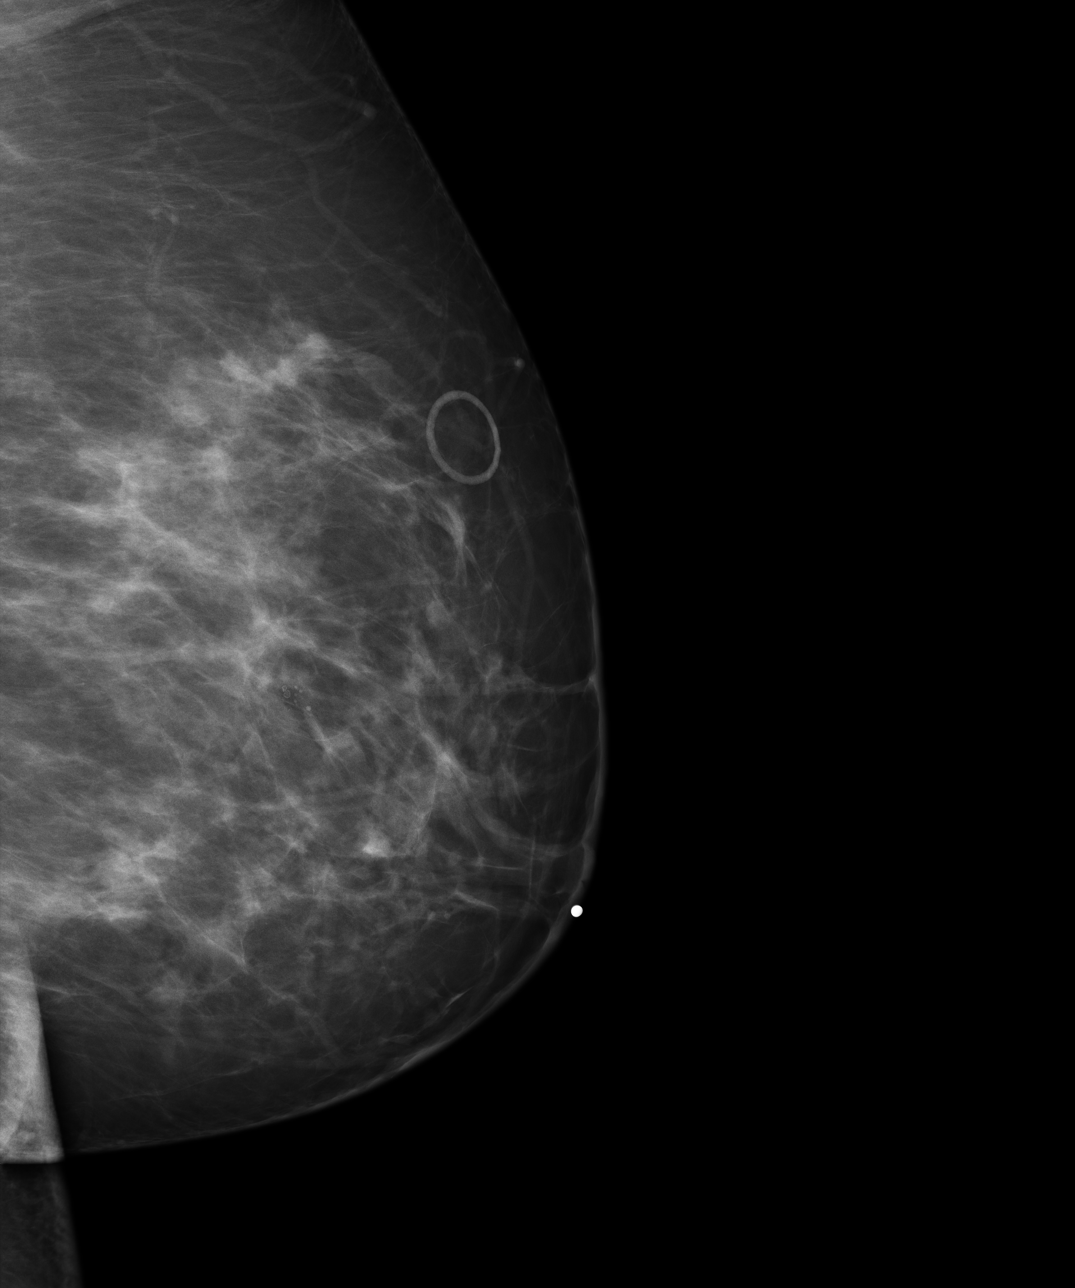

[4 of 4 positions shown; findings below may reference images not displayed]

PROCEDURE:     MAM - MAM DGTL SCREENING MAMMO W/CAD  - June 19, 2009 [DATE]

RESULT:     Density is noted in the medial portion of the left breast for
which medially rotated compression spot films are suggested for further
evaluation. No other abnormalities are identified. CAD evaluation is
nonfocal.
IMPRESSION: Questionable parenchymal density in the medial portion of
the left breast. This could represent a skin fold. Medially rotated
compression spot films are suggested for further evaluation.

BI-RADS:  Category 0 - Needs Additional Imaging Evaluation

Thank you for this opportunity to contribute to the care of your patient.

A NEGATIVE MAMMOGRAM REPORT DOES NOT PRECLUDE BIOPSY OR OTHER EVALUATION OF
A CLINICALLY PALPABLE OR OTHERWISE SUSPICIOUS MASS OR LESION. BREAST CANCER
MAY NOT BE DETECTED BY MAMMOGRAPHY IN UP TO 10% OF CASES.

## 2011-04-18 ENCOUNTER — Telehealth: Payer: Self-pay

## 2011-04-18 NOTE — Telephone Encounter (Signed)
Pt not at home, left message for pt to call back. Gentleman ? English I will try to call back next week.

## 2011-04-18 NOTE — Telephone Encounter (Signed)
Message copied by Patience Musca on Fri Apr 18, 2011  6:21 PM ------      Message from: Roxy Manns A      Created: Mon Apr 14, 2011  8:42 PM       Be compliant with med and re check tsh in 2 weeks please for hypothyroid

## 2011-04-18 NOTE — Progress Notes (Signed)
Quick Note:  Pt not at home left message for pt to call back. Gentleman I spoke with ? English I wlll try to call back next week. ______

## 2011-04-22 NOTE — Telephone Encounter (Signed)
Patient notified as instructed by telephone. Lab appt scheduled as instructed 05/06/11 at 9:30am.  

## 2011-04-22 NOTE — Telephone Encounter (Signed)
Patient notified as instructed by telephone. Lab appt scheduled as instructed 05/06/11 at 9:30am.

## 2011-05-01 ENCOUNTER — Other Ambulatory Visit: Payer: Self-pay | Admitting: Family Medicine

## 2011-05-01 DIAGNOSIS — E039 Hypothyroidism, unspecified: Secondary | ICD-10-CM

## 2011-05-06 ENCOUNTER — Other Ambulatory Visit (INDEPENDENT_AMBULATORY_CARE_PROVIDER_SITE_OTHER): Payer: PRIVATE HEALTH INSURANCE

## 2011-05-06 DIAGNOSIS — E039 Hypothyroidism, unspecified: Secondary | ICD-10-CM

## 2011-05-06 LAB — TSH: TSH: 7.49 u[IU]/mL — ABNORMAL HIGH (ref 0.35–5.50)

## 2011-05-12 ENCOUNTER — Telehealth: Payer: Self-pay | Admitting: Family Medicine

## 2011-05-12 MED ORDER — LEVOTHYROXINE SODIUM 200 MCG PO TABS: 200.0000 ug | ORAL_TABLET | Freq: Every day | ORAL | Status: DC

## 2011-05-12 NOTE — Telephone Encounter (Signed)
Ok will inc her dose and re check tsh in 6 weeks for 244.9-thanks  Px written for call in

## 2011-05-12 NOTE — Telephone Encounter (Signed)
Message copied by Judy Pimple on Mon May 12, 2011 11:21 AM ------      Message from: Eliezer Bottom      Created: Mon May 12, 2011  8:45 AM       Spoke with patient and she states she has not missed any doses

## 2011-05-12 NOTE — Telephone Encounter (Signed)
Patient notified as instructed by telephone.Lab appt scheduled as instructed 07/01/11 at 9am. Medication phoned to [facility] pharmacy as instructed.

## 2011-06-24 ENCOUNTER — Other Ambulatory Visit: Payer: Self-pay | Admitting: Family Medicine

## 2011-06-24 DIAGNOSIS — E039 Hypothyroidism, unspecified: Secondary | ICD-10-CM

## 2011-07-01 ENCOUNTER — Other Ambulatory Visit (INDEPENDENT_AMBULATORY_CARE_PROVIDER_SITE_OTHER): Payer: PRIVATE HEALTH INSURANCE

## 2011-07-01 DIAGNOSIS — E039 Hypothyroidism, unspecified: Secondary | ICD-10-CM

## 2011-07-01 LAB — TSH: TSH: 12.53 u[IU]/mL — ABNORMAL HIGH (ref 0.35–5.50)

## 2011-07-17 ENCOUNTER — Telehealth: Payer: Self-pay | Admitting: Family Medicine

## 2011-07-17 MED ORDER — LEVOTHYROXINE SODIUM 50 MCG PO TABS: 50.0000 ug | ORAL_TABLET | Freq: Every day | ORAL | Status: DC

## 2011-07-17 NOTE — Telephone Encounter (Signed)
Thanks- will increase dose  Will refill electronically to [facility]-- I want her to continue the 200 and add 50 to that  Please schedule lab for tsh and free T4 in 6 weeks

## 2011-07-17 NOTE — Telephone Encounter (Signed)
Message copied by Judy Pimple on Thu Jul 17, 2011  1:19 PM ------      Message from: Baldomero Lamy      Created: Wed Jul 16, 2011  9:45 AM       Per pt she has not missed any doses, uses [facility] pharmacy

## 2011-07-17 NOTE — Telephone Encounter (Signed)
Left v/m on home phone for pt to call back. Work # busy x 2 calls.

## 2011-07-18 NOTE — Telephone Encounter (Signed)
Patient notified as instructed by telephone. Pt will call back to schedule lab appt.

## 2011-08-12 ENCOUNTER — Other Ambulatory Visit: Payer: Self-pay | Admitting: *Deleted

## 2011-08-12 MED ORDER — LOSARTAN POTASSIUM 50 MG PO TABS: 50.0000 mg | ORAL_TABLET | Freq: Every day | ORAL | Status: DC

## 2011-11-18 ENCOUNTER — Encounter: Payer: Self-pay | Admitting: Family Medicine

## 2011-11-18 ENCOUNTER — Ambulatory Visit: Payer: Self-pay | Admitting: Family Medicine

## 2011-11-18 ENCOUNTER — Ambulatory Visit (INDEPENDENT_AMBULATORY_CARE_PROVIDER_SITE_OTHER): Payer: PRIVATE HEALTH INSURANCE | Admitting: Family Medicine

## 2011-11-18 ENCOUNTER — Telehealth: Payer: Self-pay

## 2011-11-18 VITALS — BP 124/82 | HR 70 | Temp 97.9°F | Ht 62.0 in | Wt 240.2 lb

## 2011-11-18 DIAGNOSIS — F4321 Adjustment disorder with depressed mood: Secondary | ICD-10-CM

## 2011-11-18 DIAGNOSIS — M79605 Pain in left leg: Secondary | ICD-10-CM | POA: Insufficient documentation

## 2011-11-18 DIAGNOSIS — F4320 Adjustment disorder, unspecified: Secondary | ICD-10-CM | POA: Insufficient documentation

## 2011-11-18 DIAGNOSIS — I83893 Varicose veins of bilateral lower extremities with other complications: Secondary | ICD-10-CM

## 2011-11-18 DIAGNOSIS — M79609 Pain in unspecified limb: Secondary | ICD-10-CM

## 2011-11-18 DIAGNOSIS — I83892 Varicose veins of left lower extremities with other complications: Secondary | ICD-10-CM | POA: Insufficient documentation

## 2011-11-18 NOTE — Assessment & Plan Note (Signed)
With tenderness in calf/ pos homann's sign / varicosities and swelling (induration) in calf  fam hx DVT Need to check venous duplex today  Also some trochanteric tenderness- hip bursitis is possible  Will elevate leg/ warm compress Update with result  F/u in 1 week

## 2011-11-18 NOTE — Patient Instructions (Signed)
We will refer you for venous duplex of left leg at check out  Keep leg elevated when you can - warm compress may help too  We may consider support hose in future  You may also have some bursitis in hip  Follow up with me in about a week

## 2011-11-18 NOTE — Progress Notes (Signed)
Subjective:    Patient ID: Taylor Grimes, female    DOB: Nov 12, 1960, 51 y.o.   MRN: [id number]  HPI Here for leg pain  L - from hip to foot  2 weeks- just not getting better  No trauma  No pain in back or buttock  Feels like a cramp  No redness Feels tight - but does not look swollen   Lost her sister (blood clot - unexpected) Also friend has cancer -- all over  Has had a lot of grief   Wt is down 9 lb - has been cutting down  Does have bad varicose veins -- esp in L leg   Patient Active Problem List  Diagnoses  . HYPOTHYROIDISM  . LEUKOCYTOSIS  . HYPERTENSION  . TREMOR  . TRANSAMINASES, SERUM, ELEVATED  . Obesity  . Left leg pain  . Varicose veins of leg with swelling, left  . Grief reaction   Past Medical History  Diagnosis Date  . UTI (urinary tract infection)   . Anxiety   . Breast lesion     benign  . Hypothyroid   . HTN (hypertension)   . Elevated transaminase level     ? fatty liver    Past Surgical History  Procedure Date  . Breast biopsy     benign cyst 2011   History  Substance Use Topics  . Smoking status: Former Smoker    Quit date: 07/22/1979  . Smokeless tobacco: Not on file   Comment: briefly smoked at 16  . Alcohol Use: Yes     occasional    Family History  Problem Relation Age of Onset  . Hypothyroidism Mother   . Alzheimer's disease Mother   . Stroke Father    No Known Allergies Current Outpatient Prescriptions on File Prior to Visit  Medication Sig Dispense Refill  . levothyroxine (SYNTHROID, LEVOTHROID) 200 MCG tablet Take 1 tablet (200 mcg total) by mouth daily.  30 tablet  11  . levothyroxine (SYNTHROID, LEVOTHROID) 50 MCG tablet Take 1 tablet (50 mcg total) by mouth daily. In addition to the 200 mcg dose taken daily  30 tablet  11  . losartan (COZAAR) 50 MG tablet Take 1 tablet (50 mg total) by mouth daily.  30 tablet  5      Review of Systems Review of Systems  Constitutional: Negative for fever, appetite change,  fatigue and unexpected weight change.  Eyes: Negative for pain and visual disturbance.  Respiratory: Negative for cough and shortness of breath.   Cardiovascular: Negative for cp or palpitations    Gastrointestinal: Negative for nausea, diarrhea and constipation.  Genitourinary: Negative for urgency and frequency.  Skin: Negative for pallor or rash  neg for redness MSK  Pain in L lower leg , and L hip , neg for back pain or weakeness Neurological: Negative for weakness, light-headedness, numbness and headaches.  Hematological: Negative for adenopathy. Does not bruise/bleed easily.  Psychiatric/Behavioral: Negative for dysphoric mood. The patient is not nervous/anxious.          Objective:   Physical Exam  Constitutional: She appears well-developed and well-nourished. No distress.       Obese and well appearing   HENT:  Head: Normocephalic and atraumatic.  Eyes: Conjunctivae and EOM are normal. Pupils are equal, round, and reactive to light. No scleral icterus.  Neck: Normal range of motion. Neck supple. No JVD present. Carotid bruit is not present. No thyromegaly present.  Cardiovascular: Normal rate, regular rhythm, normal heart sounds and  intact distal pulses.  Exam reveals no gallop.   Pulmonary/Chest: Effort normal and breath sounds normal. No respiratory distress. She has no wheezes.  Abdominal: Soft. She exhibits no abdominal bruit.  Musculoskeletal: She exhibits edema and tenderness.       Mild swelling in L calf surrounding varicose vein  Tender mid calf Pos homans' sign No hamstring tenderness Nl rom hip and ankle Tender over L greater troch No knee tenderness or crepitus   Lymphadenopathy:    She has no cervical adenopathy.  Neurological: She is alert. She has normal reflexes. No cranial nerve deficit. She exhibits normal muscle tone. Coordination normal.  Skin: Skin is warm and dry. No rash noted. No erythema. No pallor.  Psychiatric:       Tearful at times when disc  recent loss / grief Good attention / comm skills          Assessment & Plan:

## 2011-11-18 NOTE — Telephone Encounter (Signed)
Kim with [place] [facility] called report lt leg doppler negative for DVT. Dr Milinda Antis said for Selena Batten to tell pt neg for DVT and Dr Milinda Antis will call pt later.

## 2011-11-18 NOTE — Telephone Encounter (Signed)
Let her know I have the result and that is re assuring  I want her to start some aleve 1-2 pills twice daily with meals for pain and inflammation Elevate leg and use warm compress on it whenever she can  Will re eval at f/u and see if this helps/ then make further plan

## 2011-11-18 NOTE — Assessment & Plan Note (Signed)
Lost sister unexpectedly and loosing friend F/u next week to disc further  Has very good support and  Coping skills

## 2011-11-18 NOTE — Assessment & Plan Note (Signed)
See assessment for leg pain  Also consider supp hose and wt loss

## 2011-11-18 NOTE — Telephone Encounter (Signed)
Patient advised as instructed via telephone. 

## 2011-11-25 ENCOUNTER — Ambulatory Visit: Payer: PRIVATE HEALTH INSURANCE | Admitting: Family Medicine

## 2011-11-26 ENCOUNTER — Ambulatory Visit (INDEPENDENT_AMBULATORY_CARE_PROVIDER_SITE_OTHER): Payer: PRIVATE HEALTH INSURANCE | Admitting: Family Medicine

## 2011-11-26 ENCOUNTER — Encounter: Payer: Self-pay | Admitting: Family Medicine

## 2011-11-26 VITALS — BP 112/84 | HR 76 | Temp 98.1°F | Ht 62.0 in | Wt 241.5 lb

## 2011-11-26 DIAGNOSIS — M79605 Pain in left leg: Secondary | ICD-10-CM

## 2011-11-26 DIAGNOSIS — M79609 Pain in unspecified limb: Secondary | ICD-10-CM

## 2011-11-26 DIAGNOSIS — I83892 Varicose veins of left lower extremities with other complications: Secondary | ICD-10-CM

## 2011-11-26 DIAGNOSIS — I83893 Varicose veins of bilateral lower extremities with other complications: Secondary | ICD-10-CM

## 2011-11-26 NOTE — Patient Instructions (Signed)
Wear support stockings when up and around  Elevate leg when able and ice on hip 10 minutes at a time We will make appt with Dr Patsy Lager for hip bursitis at check out

## 2011-11-26 NOTE — Assessment & Plan Note (Signed)
These do cause pain with swelling  Px hose support to waist 15-20 mm Hg to try  Will update  Rev doppler- no clots

## 2011-11-26 NOTE — Progress Notes (Signed)
Subjective:    Patient ID: Taylor Grimes, female    DOB: 30-May-1961, 51 y.o.   MRN: [id number]  HPI Here for f/u of L leg pain  Was sent for doppler venous last time- came back neg   Still hurting the same   Usually pain starts L knee and goes all the way down At night - hip hurts if she lies on it Ok sitting - but walking is bad  Also steps -- esp going up   Some focal pain in the knee  Also pops/ cracks  More pain on outside No trauma No swelling   Patient Active Problem List  Diagnoses  . HYPOTHYROIDISM  . LEUKOCYTOSIS  . HYPERTENSION  . TREMOR  . TRANSAMINASES, SERUM, ELEVATED  . Obesity  . Left leg pain  . Varicose veins of leg with swelling, left  . Grief reaction   Past Medical History  Diagnosis Date  . UTI (urinary tract infection)   . Anxiety   . Breast lesion     benign  . Hypothyroid   . HTN (hypertension)   . Elevated transaminase level     ? fatty liver    Past Surgical History  Procedure Date  . Breast biopsy     benign cyst 2011   History  Substance Use Topics  . Smoking status: Former Smoker    Quit date: 07/22/1979  . Smokeless tobacco: Not on file   Comment: briefly smoked at 16  . Alcohol Use: Yes     occasional    Family History  Problem Relation Age of Onset  . Hypothyroidism Mother   . Alzheimer's disease Mother   . Stroke Father    No Known Allergies Current Outpatient Prescriptions on File Prior to Visit  Medication Sig Dispense Refill  . levothyroxine (SYNTHROID, LEVOTHROID) 200 MCG tablet Take 1 tablet (200 mcg total) by mouth daily.  30 tablet  11  . levothyroxine (SYNTHROID, LEVOTHROID) 50 MCG tablet Take 1 tablet (50 mcg total) by mouth daily. In addition to the 200 mcg dose taken daily  30 tablet  11  . losartan (COZAAR) 50 MG tablet Take 1 tablet (50 mg total) by mouth daily.  30 tablet  5      Review of Systems   Review of Systems  Constitutional: Negative for fever, appetite change, fatigue and unexpected  weight change.  Eyes: Negative for pain and visual disturbance.  Respiratory: Negative for cough and shortness of breath.   Cardiovascular: Negative for cp or palpitations    Gastrointestinal: Negative for nausea, diarrhea and constipation.  Genitourinary: Negative for urgency and frequency.  Skin: Negative for pallor or rash   MSK pos for leg pain , neg for joint swelling  VASC pos for varicose veins on legs that swell and hurt without ulceration Neurological: Negative for weakness, light-headedness, numbness and headaches.  Hematological: Negative for adenopathy. Does not bruise/bleed easily.  Psychiatric/Behavioral: Negative for dysphoric mood. The patient is not nervous/anxious.  pos for grief rxn    Objective:   Physical Exam  Constitutional: She appears well-developed and well-nourished. No distress.       Obese and well appearing   HENT:  Head: Normocephalic and atraumatic.  Mouth/Throat: Oropharynx is clear and moist.  Eyes: Conjunctivae and EOM are normal. Pupils are equal, round, and reactive to light.  Neck: Normal range of motion. Neck supple. No JVD present. Carotid bruit is not present. Erythema present. No thyromegaly present.  Cardiovascular: Normal rate and regular rhythm.  Pulmonary/Chest: Effort normal and breath sounds normal.  Abdominal: Soft. Bowel sounds are normal. She exhibits no distension and no abdominal bruit. There is no tenderness.  Musculoskeletal: She exhibits edema and tenderness.       bilat legs- compressible varicosities without skin change Diffuse / trace edema bilat L leg-knee , mild crepitice and nl rom/ neg joint line tenderness L hip- nl rom with troch pain on int rot L greater trochanter is tender Nl perfusion  Lymphadenopathy:    She has no cervical adenopathy.  Neurological: She is alert. She has normal reflexes. She exhibits normal muscle tone. Coordination normal.  Skin: Skin is warm and dry. No rash noted. No erythema. No pallor.    Psychiatric: She has a normal mood and affect.       Still in grief but pleasant and with good eye contact           Assessment & Plan:

## 2011-11-26 NOTE — Assessment & Plan Note (Signed)
Suspect a combination of trochanteric bursitis and knee strain No imp with aleve (minimal)  Will ref to sport med- may benefit from an injection

## 2011-12-01 ENCOUNTER — Ambulatory Visit: Payer: PRIVATE HEALTH INSURANCE | Admitting: Family Medicine

## 2011-12-10 ENCOUNTER — Encounter: Payer: Self-pay | Admitting: Family Medicine

## 2011-12-10 ENCOUNTER — Ambulatory Visit (INDEPENDENT_AMBULATORY_CARE_PROVIDER_SITE_OTHER): Payer: PRIVATE HEALTH INSURANCE | Admitting: Family Medicine

## 2011-12-10 VITALS — BP 130/78 | HR 65 | Temp 97.9°F | Ht 62.0 in | Wt 242.0 lb

## 2011-12-10 DIAGNOSIS — M76899 Other specified enthesopathies of unspecified lower limb, excluding foot: Secondary | ICD-10-CM

## 2011-12-10 DIAGNOSIS — M7062 Trochanteric bursitis, left hip: Secondary | ICD-10-CM

## 2011-12-10 NOTE — Patient Instructions (Signed)
Hip Rehab:  Hip Flexion: Toe up to ceiling, laying on your back. Lift your whole leg, 3 sets. Work up to being able to do #30 with each set.  Hip elevations, Toe and leg turned out to side.  Lift whole leg, 3 sets. Work up to being able to do #30 with each set.  Hip Abductions: Lying on side, straight out to side. 3 sets, work up to being able to do #30 with each set.  At the beginning you may only be able to do a lot less, try to do #10.  

## 2011-12-10 NOTE — Progress Notes (Signed)
  Patient Name: Taylor Grimes Date of Birth: 06-11-1961 Age: 51 y.o. Medical Record Number: [id number] Gender: female Date of Encounter: 12/10/2011  History of Present Illness:  Taylor Grimes is a 51 y.o. very pleasant female patient who presents with the following:  Left leg pain. Lateral down to foot, but mostly in the lateral thigh and mostly on lateral hip. Works at [employer] in [employer].  No back pain, no groin pain, no buttocks pain. No numbness, no tingling, no perceived weakness. No history of fracture, surgery in the affected area. On her feet all day.  Past Medical History, Surgical History, Social History, Family History, Problem List, Medications, and Allergies have been reviewed and updated if relevant.  Review of Systems:  GEN: No fevers, chills. Nontoxic. Primarily MSK c/o today. MSK: Detailed in the HPI GI: tolerating PO intake without difficulty Neuro: No numbness, parasthesias, or tingling associated. Otherwise the pertinent positives of the ROS are noted above.    Physical Examination: Filed Vitals:   12/10/11 0743  BP: 130/78  Pulse: 65  Temp: 97.9 F (36.6 C)  TempSrc: Oral  Height: 5\' 2"  (1.575 m)  Weight: 242 lb (109.77 kg)  SpO2: 97%    Body mass index is 44.26 kg/(m^2).   GEN: WDWN, NAD, Non-toxic, Alert & Oriented x 3 HEENT: Atraumatic, Normocephalic.  Ears and Nose: No external deformity. EXTR: No clubbing/cyanosis/edema NEURO: Normal gait.  PSYCH: Normally interactive. Conversant. Not depressed or anxious appearing.  Calm demeanor.   HIP EXAM: SIDE: B ROM: Abduction, Flexion, Internal and External range of motion: full Pain with terminal IROM and EROM: NT GTB: markedly T on L SLR: NEG Knees: No effusion, mild crepitus FABER: NT REVERSE FABER: pos Piriformis: NT at direct palpation Str: flexion: 4/5 on L abduction: 4-/5 on L adduction: 4+/5 on L  Neurovasc intact B LE     Assessment and Plan: 1. Trochanteric bursitis of left hip       I reviewed with the patient the structures involved and how they related to diagnosis.   Patient was given a rehabilitation protocol emphasizing core rehab, partcularly addressing abductor weakness, and stretching of the pelvic musculature, hips, and ITB.  Trochanteric Bursitis Injection, L Verbal consent obtained. Risks (including infection, potential atrophy), benefits, and alternatives reviewed. Greater trochanter sterilely prepped with Chloraprep. Ethyl Chloride used for anesthesia. 8 cc of Lidocaine 1% injected with 2 cc of 40 mg Depo-Medrol into trochanteric bursa at area of maximal tenderness at greater trochanter. Needle taken to bone to troch bursa, flows easily. Bursa massaged. No bleeding and no complications. Decreased pain after injection. Needle: 22 gauge spinal needle  F/u 4-5 weeks, she will need to check sched. If completely better, ok to cancel

## 2012-06-15 ENCOUNTER — Other Ambulatory Visit: Payer: Self-pay | Admitting: *Deleted

## 2012-06-15 ENCOUNTER — Other Ambulatory Visit: Payer: Self-pay

## 2012-06-15 MED ORDER — LEVOTHYROXINE SODIUM 200 MCG PO TABS: 200.0000 ug | ORAL_TABLET | Freq: Every day | ORAL | Status: DC

## 2012-06-15 MED ORDER — LOSARTAN POTASSIUM 50 MG PO TABS: 50.0000 mg | ORAL_TABLET | Freq: Every day | ORAL | Status: DC

## 2012-06-15 NOTE — Telephone Encounter (Signed)
Pt said [facility] did not get answer to refill for levothyroxine. Advised pt would call # 30 to [facility] and Rose gave pt f/u appt with Dr Milinda Antis.Medication phoned to [facility] pharmacy as instructed.

## 2012-06-23 ENCOUNTER — Encounter: Payer: Self-pay | Admitting: Family Medicine

## 2012-06-23 ENCOUNTER — Ambulatory Visit (INDEPENDENT_AMBULATORY_CARE_PROVIDER_SITE_OTHER): Payer: PRIVATE HEALTH INSURANCE | Admitting: Family Medicine

## 2012-06-23 VITALS — BP 128/68 | HR 87 | Temp 98.7°F | Ht 62.0 in | Wt 250.0 lb

## 2012-06-23 DIAGNOSIS — F4321 Adjustment disorder with depressed mood: Secondary | ICD-10-CM

## 2012-06-23 DIAGNOSIS — E669 Obesity, unspecified: Secondary | ICD-10-CM

## 2012-06-23 DIAGNOSIS — I10 Essential (primary) hypertension: Secondary | ICD-10-CM

## 2012-06-23 DIAGNOSIS — E039 Hypothyroidism, unspecified: Secondary | ICD-10-CM

## 2012-06-23 DIAGNOSIS — Z23 Encounter for immunization: Secondary | ICD-10-CM

## 2012-06-23 NOTE — Progress Notes (Signed)
Subjective:    Patient ID: Taylor Grimes, female    DOB: 1961/06/03, 51 y.o.   MRN: [id number]  HPI Here for medicine refills and mood issues  Grief- lots of it  Lost her sister Lost best friend to cancer  Lost a young friend to suicide  Then a friend's mother passed   Her mother has dementia - and is close to dying   She has very good family support Still feels depressed  Wants to sleep all the time and does not want to get out of bed  Hard to get motivated and get going Is forcing herself to get out - has a day trip planned Working 40 hour weeks at [employer] and eating more  Is sleeping ok  Does not want counseling because her sister is very good support  Does not want medicine yet   She does not smoke or drink   bp is stable today  No cp or palpitations or headaches or edema  No side effects to medicines  BP Readings from Last 3 Encounters:  06/23/12 128/68  12/10/11 130/78  11/26/11 112/84      Hypothyroid  Lab Results  Component Value Date   TSH 12.53* 07/01/2011   may need dose change  Wants to screen for DM - fam hx and extra wt   Patient Active Problem List  Diagnosis  . HYPOTHYROIDISM  . LEUKOCYTOSIS  . HYPERTENSION  . TREMOR  . TRANSAMINASES, SERUM, ELEVATED  . Obesity  . Left leg pain  . Varicose veins of leg with swelling, left  . Grief reaction   Past Medical History  Diagnosis Date  . UTI (urinary tract infection)   . Anxiety   . Breast lesion     benign  . Hypothyroid   . HTN (hypertension)   . Elevated transaminase level     ? fatty liver    Past Surgical History  Procedure Date  . Breast biopsy     benign cyst 2011   History  Substance Use Topics  . Smoking status: Former Smoker    Quit date: 07/22/1979  . Smokeless tobacco: Not on file     Comment: briefly smoked at 16  . Alcohol Use: Yes     Comment: rare   Family History  Problem Relation Age of Onset  . Hypothyroidism Mother   . Alzheimer's  disease Mother   . Stroke Father    No Known Allergies Current Outpatient Prescriptions on File Prior to Visit  Medication Sig Dispense Refill  . levothyroxine (SYNTHROID, LEVOTHROID) 200 MCG tablet Take 1 tablet (200 mcg total) by mouth daily. No additional refills until seen by Dr.  30 tablet  0  . levothyroxine (SYNTHROID, LEVOTHROID) 50 MCG tablet Take 1 tablet (50 mcg total) by mouth daily. In addition to the 200 mcg dose taken daily  30 tablet  11  . losartan (COZAAR) 50 MG tablet Take 1 tablet (50 mg total) by mouth daily. No additional refills until seen by Dr.  30 tablet  0  . NON FORMULARY Support hose to waist 15-20 mm Hg Wear daily as directed             Review of Systems Review of Systems  Constitutional: Negative for fever, appetite change, fatigue and unexpected weight change.  Eyes: Negative for pain and visual disturbance.  Respiratory: Negative for cough and shortness of breath.   Cardiovascular: Negative for cp or palpitations    Gastrointestinal: Negative  for nausea, diarrhea and constipation.  Genitourinary: Negative for urgency and frequency.  Skin: Negative for pallor or rash   Neurological: Negative for weakness, light-headedness, numbness and headaches.  Hematological: Negative for adenopathy. Does not bruise/bleed easily.  Psychiatric/Behavioral: Negative for dysphoric mood. The patient is not nervous/anxious.  pos for grief        Objective:   Physical Exam  Constitutional: She appears well-developed and well-nourished. No distress.  HENT:  Head: Normocephalic and atraumatic.  Mouth/Throat: Oropharynx is clear and moist.  Eyes: Conjunctivae normal and EOM are normal. Pupils are equal, round, and reactive to light. Right eye exhibits no discharge. Left eye exhibits no discharge. No scleral icterus.  Neck: Normal range of motion. Neck supple. No JVD present. Carotid bruit is not present. No thyromegaly present.  Cardiovascular: Normal rate, regular  rhythm, normal heart sounds and intact distal pulses.  Exam reveals no gallop.   Pulmonary/Chest: Effort normal and breath sounds normal. No respiratory distress. She has no wheezes.  Abdominal: Soft. Bowel sounds are normal. She exhibits no distension, no abdominal bruit and no mass. There is no tenderness.  Musculoskeletal: She exhibits no edema and no tenderness.  Lymphadenopathy:    She has no cervical adenopathy.  Neurological: She is alert. She has normal reflexes. No cranial nerve deficit. She exhibits normal muscle tone. Coordination normal.  Skin: Skin is warm and dry. No rash noted. No erythema. No pallor.  Psychiatric: She has a normal mood and affect.          Assessment & Plan:

## 2012-06-23 NOTE — Assessment & Plan Note (Signed)
tsh today  Adj dose as needed No new symptoms except for mood change and wt gain F/u 3 mo

## 2012-06-23 NOTE — Assessment & Plan Note (Signed)
bp in fair control at this time  No changes needed  Disc lifstyle change with low sodium diet and exercise  Disc need for wt loss  Lab today F/u 3 mo PE

## 2012-06-23 NOTE — Patient Instructions (Addendum)
Labs today If your mood worsens - please let me know so we can get you some help Keep talking to your sister Get some exercise- do not use food as your comfort  Schedule PE in 3 months (no labs prior since we are doing them today)

## 2012-06-23 NOTE — Assessment & Plan Note (Signed)
Long disc about coping tech -needs to stop emot eating  Good support Declines counseling or med at this time >25 min spent with face to face with patient, >50% counseling and/or coordinating care

## 2012-06-24 LAB — CBC WITH DIFFERENTIAL/PLATELET
Basophils Absolute: 0.1 10*3/uL (ref 0.0–0.1)
Basophils Relative: 1.7 % (ref 0.0–3.0)
Eosinophils Absolute: 0.3 10*3/uL (ref 0.0–0.7)
Eosinophils Relative: 4.4 % (ref 0.0–5.0)
HCT: 42.7 % (ref 36.0–46.0)
Hemoglobin: 14.2 g/dL (ref 12.0–15.0)
Lymphocytes Relative: 31.6 % (ref 12.0–46.0)
Lymphs Abs: 2.5 10*3/uL (ref 0.7–4.0)
MCHC: 33.3 g/dL (ref 30.0–36.0)
MCV: 90.7 fl (ref 78.0–100.0)
Monocytes Absolute: 0.6 10*3/uL (ref 0.1–1.0)
Monocytes Relative: 8 % (ref 3.0–12.0)
Neutro Abs: 4.3 10*3/uL (ref 1.4–7.7)
Neutrophils Relative %: 54.3 % (ref 43.0–77.0)
Platelets: 183 10*3/uL (ref 150.0–400.0)
RBC: 4.7 Mil/uL (ref 3.87–5.11)
RDW: 13.7 % (ref 11.5–14.6)
WBC: 7.9 10*3/uL (ref 4.5–10.5)

## 2012-06-24 LAB — LIPID PANEL
Cholesterol: 179 mg/dL (ref 0–200)
HDL: 32.8 mg/dL — ABNORMAL LOW (ref >39.00)
LDL Cholesterol: 107 mg/dL — ABNORMAL HIGH (ref 0–99)
Total CHOL/HDL Ratio: 5
Triglycerides: 196 mg/dL — ABNORMAL HIGH (ref 0.0–149.0)
VLDL: 39.2 mg/dL (ref 0.0–40.0)

## 2012-06-24 LAB — COMPREHENSIVE METABOLIC PANEL
ALT: 53 U/L — ABNORMAL HIGH (ref 0–35)
AST: 54 U/L — ABNORMAL HIGH (ref 0–37)
Albumin: 3.6 g/dL (ref 3.5–5.2)
Alkaline Phosphatase: 93 U/L (ref 39–117)
BUN: 13 mg/dL (ref 6–23)
CO2: 31 mEq/L (ref 19–32)
Calcium: 9.2 mg/dL (ref 8.4–10.5)
Chloride: 101 mEq/L (ref 96–112)
Creatinine, Ser: 1 mg/dL (ref 0.4–1.2)
GFR: 63.75 mL/min (ref >60.00)
Glucose, Bld: 286 mg/dL — ABNORMAL HIGH (ref 70–99)
Potassium: 4.1 mEq/L (ref 3.5–5.1)
Sodium: 137 mEq/L (ref 135–145)
Total Bilirubin: 0.7 mg/dL (ref 0.3–1.2)
Total Protein: 7.6 g/dL (ref 6.0–8.3)

## 2012-06-24 LAB — TSH: TSH: 0.94 u[IU]/mL (ref 0.35–5.50)

## 2012-06-28 ENCOUNTER — Ambulatory Visit: Payer: PRIVATE HEALTH INSURANCE

## 2012-06-28 ENCOUNTER — Telehealth: Payer: Self-pay | Admitting: *Deleted

## 2012-06-28 DIAGNOSIS — R7989 Other specified abnormal findings of blood chemistry: Secondary | ICD-10-CM | POA: Insufficient documentation

## 2012-06-28 LAB — HEMOGLOBIN A1C: Hgb A1c MFr Bld: 7.5 % — ABNORMAL HIGH (ref 4.6–6.5)

## 2012-06-28 NOTE — Telephone Encounter (Signed)
Request to add sent.

## 2012-06-28 NOTE — Telephone Encounter (Signed)
Dr. Milinda Antis wants to add an A1c on pt's labs if not we can schedule pt for another draw

## 2012-06-29 ENCOUNTER — Telehealth: Payer: Self-pay | Admitting: Family Medicine

## 2012-06-29 NOTE — Telephone Encounter (Signed)
Caller: Skylan/Patient; Phone: [phone]; Reason for Call: Patient returning call from office.  Per Epic notes, Dr.  Milinda Antis would like to see patient again to discuss lab results; appt scheduled 1230 06/30/12 with Dr.  Milinda Antis.  Krs/can

## 2012-06-30 ENCOUNTER — Ambulatory Visit (INDEPENDENT_AMBULATORY_CARE_PROVIDER_SITE_OTHER): Payer: PRIVATE HEALTH INSURANCE | Admitting: Family Medicine

## 2012-06-30 ENCOUNTER — Telehealth: Payer: Self-pay | Admitting: Family Medicine

## 2012-06-30 ENCOUNTER — Encounter: Payer: Self-pay | Admitting: Family Medicine

## 2012-06-30 VITALS — BP 140/80 | HR 84 | Temp 98.0°F | Ht 62.0 in | Wt 250.2 lb

## 2012-06-30 DIAGNOSIS — R7402 Elevation of levels of lactic acid dehydrogenase (LDH): Secondary | ICD-10-CM

## 2012-06-30 DIAGNOSIS — R7401 Elevation of levels of liver transaminase levels: Secondary | ICD-10-CM

## 2012-06-30 DIAGNOSIS — E119 Type 2 diabetes mellitus without complications: Secondary | ICD-10-CM

## 2012-06-30 DIAGNOSIS — R748 Abnormal levels of other serum enzymes: Secondary | ICD-10-CM

## 2012-06-30 NOTE — Progress Notes (Signed)
Subjective:    Patient ID: Taylor Grimes, female    DOB: 1960-12-17, 51 y.o.   MRN: [id number]  HPI Here for f/u of labs - DM2 and inc transaminases  Lab Results  Component Value Date   HGBA1C 7.5* 06/28/2012   sugar at time of draw was 25  Sister had DM  Also some other relatives   he already knows how to check her sugar - her husband used to be diabetes   She thinks she can get a hold of emotional eating   Is a sweet eater at times- more after her sister died  Drinks sweet tea and coke  She does like grilled chicken  Too many tortillas , potato  Some bread and pasta   Exercise - on good days- walks outdoors  Wants to start walking more     Is obese with high bmi   Lab Results  Component Value Date   ALT 53* 06/23/2012   AST 54* 06/23/2012   ALKPHOS 93 06/23/2012   BILITOT 0.7 06/23/2012   no tylenol or alcohol No hx of hepatitis Likely has fatty liver   She is ready to loose weight   Patient Active Problem List  Diagnosis  . HYPOTHYROIDISM  . LEUKOCYTOSIS  . HYPERTENSION  . TREMOR  . TRANSAMINASES, SERUM, ELEVATED  . Obesity  . Left leg pain  . Varicose veins of leg with swelling, left  . Grief reaction  . Other abnormal blood chemistry  . DM type 2 (diabetes mellitus, type 2)  . Abnormal transaminases   Past Medical History  Diagnosis Date  . UTI (urinary tract infection)   . Anxiety   . Breast lesion     benign  . Hypothyroid   . HTN (hypertension)   . Elevated transaminase level     ? fatty liver    Past Surgical History  Procedure Date  . Breast biopsy     benign cyst 2011   History  Substance Use Topics  . Smoking status: Former Smoker    Quit date: 07/22/1979  . Smokeless tobacco: Not on file     Comment: briefly smoked at 16  . Alcohol Use: Yes     Comment: rare   Family History  Problem Relation Age of Onset  . Hypothyroidism Mother   . Alzheimer's disease Mother   . Stroke Father    No Known Allergies Current Outpatient  Prescriptions on File Prior to Visit  Medication Sig Dispense Refill  . levothyroxine (SYNTHROID, LEVOTHROID) 200 MCG tablet Take 1 tablet (200 mcg total) by mouth daily. No additional refills until seen by Dr.  30 tablet  0  . levothyroxine (SYNTHROID, LEVOTHROID) 50 MCG tablet Take 1 tablet (50 mcg total) by mouth daily. In addition to the 200 mcg dose taken daily  30 tablet  11  . losartan (COZAAR) 50 MG tablet Take 1 tablet (50 mg total) by mouth daily. No additional refills until seen by Dr.  30 tablet  0  . NON FORMULARY Support hose to waist 15-20 mm Hg Wear daily as directed            Review of Systems Review of Systems  Constitutional: Negative for fever, appetite change, fatigue and unexpected weight change.  Eyes: Negative for pain and visual disturbance.  Respiratory: Negative for cough and shortness of breath.   Cardiovascular: Negative for cp or palpitations    Gastrointestinal: Negative for nausea, diarrhea and constipation.  Genitourinary: Negative for urgency and frequency.  pos for thirst  Skin: Negative for pallor or rash   Neurological: Negative for weakness, light-headedness, numbness and headaches.  Hematological: Negative for adenopathy. Does not bruise/bleed easily.  Psychiatric/Behavioral: Negative for dysphoric mood. The patient is not nervous/anxious.         Objective:   Physical Exam  Constitutional: She appears well-developed and well-nourished. No distress.       obese and well appearing   HENT:  Head: Normocephalic and atraumatic.  Cardiovascular: Normal rate and regular rhythm.   Neurological: She is alert. She has normal reflexes. No sensory deficit.  Skin: Skin is warm and dry.  Psychiatric: She has a normal mood and affect.          Assessment & Plan:

## 2012-06-30 NOTE — Telephone Encounter (Signed)
Caller: Jenniah/Patient; Phone: [phone]; Reason for Call: Patient wants to review what was said in office 06/30/12.  States she was diagnosed with diabetes and "took in a whole bunch of information.  " States while looking over her materials from today, she does not see prescriptions for diabetes.  Per Epic, reviewed office visit "take-aways, " such as diet changes, avoidance of sugars/sweets/carbs, exercise increasing to 5 days per week, and referral for diabetes education.  Advised per Epic no new medications for diabetes were ordered, and advised patient that the goal is no new medications for diabetes will ever be ordered, but to focus on diet and exercise.  No further questions or concerns at this time.  Krs/can

## 2012-06-30 NOTE — Patient Instructions (Addendum)
You have new diabetes This can be corrected with weight loss through appropriate diet and exercise  We will refer you to diabetic teaching at check out  Your health is your new priority  Stay away from sweets and sugar drinks (diet drinks are ok)  Cut back on all starches/ carbohydrates  Also cut portions Goal for exercise - to work up to , is 30 minutes five days per week Follow up with me in 3 months

## 2012-07-01 MED ORDER — METFORMIN HCL 500 MG PO TABS: 500.0000 mg | ORAL_TABLET | Freq: Two times a day (BID) | ORAL | Status: DC

## 2012-07-01 NOTE — Telephone Encounter (Signed)
She was right, I meant to start her on metformin 500 bid Px written for call in   We discussed this at her visit  Update me if any side effects

## 2012-07-01 NOTE — Telephone Encounter (Signed)
Pt advise to start med, Rx called in to pharm. As prescribed   Pt said she needed Taylor Grimes to call her and set up the Diabetes Education appt. (cell # is best # to reach her)

## 2012-07-04 NOTE — Assessment & Plan Note (Signed)
Lab Results  Component Value Date   HGBA1C 7.5* 06/28/2012    This is new Pt understands that obesity plays a big role Disc diet/ exercise / need for wt loss and ref to DM teaching  Will learn to check glucose  Start metformin 500 bid- disc poss side eff in detail

## 2012-07-04 NOTE — Assessment & Plan Note (Signed)
This is likely related to obesity and fatty liver  Disc imp of wt loss and made plan for that

## 2012-07-07 ENCOUNTER — Telehealth: Payer: Self-pay | Admitting: Family Medicine

## 2012-07-07 NOTE — Telephone Encounter (Signed)
[facility] Triage Call Report Triage Record Num: [id number] Operator: Albertine Grates Patient Name: Taylor Grimes Call Date & Time: 07/06/2012 5:10:32PM Patient Phone: [phone] PCP: Audrie Gallus. Tower Patient Gender: Female PCP Fax : Patient DOB: 15-Feb-1961 Practice Name: [facility] Reason for Call: Caller: Debar/Patient; PCP: Roxy Manns [facility]); CB#: [phone]; States MD would not write script for glucose monitor until patient completed "diabetic class". Will not be able to go to this class as is not covered by insurance. Wants MD to write script for monitor. Advised call office 12-18. Protocol(s) Used: Office Note Recommended Outcome per Protocol: Information Noted and Sent to Office Reason for Outcome: Caller information to office Care Advice: ~ 07/06/2012 5:13:57PM Page 1 of 1 [email or web address]

## 2012-07-07 NOTE — Telephone Encounter (Signed)
Left voicemail requesting pt to call office, will try to call back later 

## 2012-07-07 NOTE — Telephone Encounter (Signed)
Rx faxed to pt's pharm. and pt notified

## 2012-07-07 NOTE — Telephone Encounter (Signed)
Px is written and in IN box for fax or pick up or call in

## 2012-08-03 ENCOUNTER — Other Ambulatory Visit: Payer: Self-pay | Admitting: *Deleted

## 2012-08-03 MED ORDER — LOSARTAN POTASSIUM 50 MG PO TABS: 50.0000 mg | ORAL_TABLET | Freq: Every day | ORAL | Status: DC

## 2012-08-03 MED ORDER — LEVOTHYROXINE SODIUM 200 MCG PO TABS: 200.0000 ug | ORAL_TABLET | Freq: Every day | ORAL | Status: DC

## 2012-08-03 MED ORDER — LEVOTHYROXINE SODIUM 50 MCG PO TABS: 50.0000 ug | ORAL_TABLET | Freq: Every day | ORAL | Status: DC

## 2012-09-28 ENCOUNTER — Encounter: Payer: Self-pay | Admitting: Family Medicine

## 2012-09-28 ENCOUNTER — Ambulatory Visit (INDEPENDENT_AMBULATORY_CARE_PROVIDER_SITE_OTHER): Payer: PRIVATE HEALTH INSURANCE | Admitting: Family Medicine

## 2012-09-28 ENCOUNTER — Telehealth: Payer: Self-pay | Admitting: Family Medicine

## 2012-09-28 VITALS — BP 108/68 | HR 100 | Temp 98.6°F | Ht 62.0 in | Wt 235.2 lb

## 2012-09-28 DIAGNOSIS — R82998 Other abnormal findings in urine: Secondary | ICD-10-CM

## 2012-09-28 DIAGNOSIS — R7402 Elevation of levels of lactic acid dehydrogenase (LDH): Secondary | ICD-10-CM

## 2012-09-28 DIAGNOSIS — R8290 Unspecified abnormal findings in urine: Secondary | ICD-10-CM

## 2012-09-28 DIAGNOSIS — R7401 Elevation of levels of liver transaminase levels: Secondary | ICD-10-CM

## 2012-09-28 DIAGNOSIS — E119 Type 2 diabetes mellitus without complications: Secondary | ICD-10-CM

## 2012-09-28 DIAGNOSIS — R109 Unspecified abdominal pain: Secondary | ICD-10-CM

## 2012-09-28 LAB — POCT URINALYSIS DIPSTICK
Bilirubin, UA: NEGATIVE
Blood, UA: NEGATIVE
Glucose, UA: NEGATIVE
Leukocytes, UA: NEGATIVE
Nitrite, UA: NEGATIVE
Spec Grav, UA: >=1.03
Urobilinogen, UA: 1
pH, UA: 6

## 2012-09-28 LAB — CBC WITH DIFFERENTIAL/PLATELET
Basophils Absolute: 0.1 10*3/uL (ref 0.0–0.1)
Basophils Relative: 0.6 % (ref 0.0–3.0)
Eosinophils Absolute: 0.4 10*3/uL (ref 0.0–0.7)
Eosinophils Relative: 4.5 % (ref 0.0–5.0)
HCT: 42.3 % (ref 36.0–46.0)
Hemoglobin: 14.3 g/dL (ref 12.0–15.0)
Lymphocytes Relative: 34 % (ref 12.0–46.0)
Lymphs Abs: 3 10*3/uL (ref 0.7–4.0)
MCHC: 33.9 g/dL (ref 30.0–36.0)
MCV: 89.4 fl (ref 78.0–100.0)
Monocytes Absolute: 0.8 10*3/uL (ref 0.1–1.0)
Monocytes Relative: 8.7 % (ref 3.0–12.0)
Neutro Abs: 4.6 10*3/uL (ref 1.4–7.7)
Neutrophils Relative %: 52.2 % (ref 43.0–77.0)
Platelets: 261 10*3/uL (ref 150.0–400.0)
RBC: 4.73 Mil/uL (ref 3.87–5.11)
RDW: 12.9 % (ref 11.5–14.6)
WBC: 8.8 10*3/uL (ref 4.5–10.5)

## 2012-09-28 LAB — COMPREHENSIVE METABOLIC PANEL
ALT: 63 U/L — ABNORMAL HIGH (ref 0–35)
AST: 51 U/L — ABNORMAL HIGH (ref 0–37)
Albumin: 3.9 g/dL (ref 3.5–5.2)
Alkaline Phosphatase: 78 U/L (ref 39–117)
BUN: 18 mg/dL (ref 6–23)
CO2: 29 mEq/L (ref 19–32)
Calcium: 9.6 mg/dL (ref 8.4–10.5)
Chloride: 104 mEq/L (ref 96–112)
Creatinine, Ser: 1 mg/dL (ref 0.4–1.2)
GFR: 63.69 mL/min (ref >60.00)
Glucose, Bld: 108 mg/dL — ABNORMAL HIGH (ref 70–99)
Potassium: 3.9 mEq/L (ref 3.5–5.1)
Sodium: 140 mEq/L (ref 135–145)
Total Bilirubin: 0.9 mg/dL (ref 0.3–1.2)
Total Protein: 7.9 g/dL (ref 6.0–8.3)

## 2012-09-28 LAB — POCT UA - MICROSCOPIC ONLY

## 2012-09-28 LAB — HEMOGLOBIN A1C: Hgb A1c MFr Bld: 6.3 % (ref 4.6–6.5)

## 2012-09-28 NOTE — Assessment & Plan Note (Signed)
With some suprapubic pain - urine sent for cx

## 2012-09-28 NOTE — Assessment & Plan Note (Signed)
Doing much better - with diet and exercise and weight loss- urged to keep it up Tolerating metformin with some loose stools  Sugars at home are better also  a1c today Will see her back for annual exam next mo

## 2012-09-28 NOTE — Progress Notes (Signed)
Subjective:    Patient ID: Taylor Grimes, female    DOB: [birth year], 52 y.o.   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  A lot of loss (lost both sister and mother)  Some pain in groin area/low abdomen over bladder - after bending over during work yesterday No urinary symptoms or blood in urine  No back pain  Some gas on and off  No vaginal d/c or bleeding   Diabetes Home sugar results - avg 80s-low 100s - really pleased  DM diet -much better Exercise - uses elliptical and also exercise bike  Symptoms A1C last  Lab Results  Component Value Date   HGBA1C 7.5* 06/28/2012  has lost 15 lb since last visit - is very proud of that  Cut back on fat and way back on sugar   No problems with medications -on metformin- increased bms and that is ok with her -not severe  Renal protection- on arb Last eye exam    Lab Results  Component Value Date   ALT 53* 06/23/2012   AST 54* 06/23/2012   ALKPHOS 93 06/23/2012   BILITOT 0.7 06/23/2012     Patient Active Problem List  Diagnosis  . HYPOTHYROIDISM  . LEUKOCYTOSIS  . HYPERTENSION  . TREMOR  . TRANSAMINASES, SERUM, ELEVATED  . Obesity  . Left leg pain  . Varicose veins of leg with swelling, left  . Grief reaction  . Other abnormal blood chemistry  . DM type 2 (diabetes mellitus, type 2)  . Abnormal transaminases  . Abdominal  pain, other specified site  . Abnormal urine finding   Past Medical History  Diagnosis Date  . UTI (urinary tract infection)   . Anxiety   . Breast lesion     benign  . Hypothyroid   . HTN (hypertension)   . Elevated transaminase level     ? fatty liver   . Diabetes mellitus without complication    Past Surgical History  Procedure Laterality Date  . Breast biopsy      benign cyst 2011   History  Substance Use Topics  . Smoking status: Former Smoker    Quit date:  07/22/1979  . Smokeless tobacco: Not on file     Comment: briefly smoked at 16  . Alcohol Use: Yes     Comment: rare   Family History  Problem Relation Age of Onset  . Hypothyroidism Mother   . Alzheimer's disease Mother   . Stroke Father    No Known Allergies Current Outpatient Prescriptions on File Prior to Visit  Medication Sig Dispense Refill  . levothyroxine (SYNTHROID, LEVOTHROID) 200 MCG tablet Take 1 tablet (200 mcg total) by mouth daily.  30 tablet  6  . levothyroxine (SYNTHROID, LEVOTHROID) 50 MCG tablet Take 1 tablet (50 mcg total) by mouth daily. In addition to the 200 mcg dose taken daily  30 tablet  6  . losartan (COZAAR) 50 MG tablet Take 1 tablet (50 mg total) by mouth daily.  30 tablet  6  . metFORMIN (GLUCOPHAGE) 500 MG tablet Take 1 tablet (500 mg total) by mouth 2 (two) times daily with a meal.  60 tablet  5  . NON FORMULARY Support hose to waist 15-20 mm Hg Wear daily as directed       No current facility-administered medications  on file prior to visit.     Review of Systems Review of Systems  Constitutional: Negative for fever, appetite change, fatigue and unexpected weight change.  Eyes: Negative for pain and visual disturbance.  Respiratory: Negative for cough and shortness of breath.   Cardiovascular: Negative for cp or palpitations    Gastrointestinal: Negative for nausea, diarrhea and constipation. pos for low abd pain  Genitourinary: Negative for urgency and frequency. neg for excessive thirst neg for gross blood in urine or flank pain  Skin: Negative for pallor or rash   Neurological: Negative for weakness, light-headedness, numbness and headaches.  Hematological: Negative for adenopathy. Does not bruise/bleed easily.  Psychiatric/Behavioral: Negative for dysphoric mood. The patient is not nervous/anxious.         Objective:   Physical Exam  Constitutional: She appears well-developed and well-nourished. No distress.  obese and well appearing   Wt loss noted, however   HENT:  Head: Normocephalic and atraumatic.  Mouth/Throat: Oropharynx is clear and moist.  Eyes: Conjunctivae and EOM are normal. Pupils are equal, round, and reactive to light. No scleral icterus.  Neck: Normal range of motion. Neck supple. Carotid bruit is not present.  Cardiovascular: Normal rate and regular rhythm.   Pulmonary/Chest: Effort normal and breath sounds normal. No respiratory distress. She has no wheezes.  Abdominal: Soft. Bowel sounds are normal. She exhibits no distension, no pulsatile liver, no abdominal bruit and no mass. There is no hepatosplenomegaly. There is tenderness in the suprapubic area. There is no rigidity, no rebound, no guarding and no CVA tenderness.  Musculoskeletal: She exhibits no edema.  Lymphadenopathy:    She has no cervical adenopathy.  Neurological: She is alert.  Skin: Skin is warm and dry. No rash noted.  Psychiatric: She has a normal mood and affect.          Assessment & Plan:

## 2012-09-28 NOTE — Telephone Encounter (Signed)
Caller: Mason/Patient; Phone: [phone]; Reason for Call: Patient calling to inquire regarding results of urine culture.  States she received a message from the office, then spoke with staff, who advised the culture was sent out.  Per Epic, culture results pending; advised to wait for call back when labs back.  Patient agrees; no further questions or concerns at this time.  Krs/can

## 2012-09-28 NOTE — Assessment & Plan Note (Addendum)
bilat LQ pain with mild tenderness ua is pos for sediment and some leukocytes and protein-this was sent for culture  Cbc checked May be gas related to stool change from metformin and better diet as well - but need to r/o uti

## 2012-09-28 NOTE — Patient Instructions (Addendum)
Great job with lifestyle change and diet/ exercise/ weight loss so far Keep it up ! Labs today  If low abdominal pain continues - or worsens- let me know  ( lab today and urinalysis for that)

## 2012-09-28 NOTE — Assessment & Plan Note (Signed)
Suspect fatty liver- may be imp after wt loss Lab today

## 2012-09-29 ENCOUNTER — Encounter: Payer: Self-pay | Admitting: *Deleted

## 2012-09-30 LAB — URINE CULTURE: Colony Count: 80000

## 2012-10-01 ENCOUNTER — Other Ambulatory Visit (INDEPENDENT_AMBULATORY_CARE_PROVIDER_SITE_OTHER): Payer: PRIVATE HEALTH INSURANCE

## 2012-10-01 DIAGNOSIS — N390 Urinary tract infection, site not specified: Secondary | ICD-10-CM

## 2012-10-03 LAB — URINE CULTURE: Colony Count: 75000

## 2012-10-04 ENCOUNTER — Telehealth: Payer: Self-pay | Admitting: Family Medicine

## 2012-10-04 MED ORDER — CIPROFLOXACIN HCL 250 MG PO TABS: 250.0000 mg | ORAL_TABLET | Freq: Two times a day (BID) | ORAL | Status: DC

## 2012-10-04 NOTE — Telephone Encounter (Signed)
Message copied by Judy Pimple on Mon Oct 04, 2012 10:37 PM ------      Message from: Shon Millet      Created: Mon Oct 04, 2012  4:52 PM       Pt is still having symptoms of UTI and now she has new symptom, lower back pain ------

## 2012-10-04 NOTE — Telephone Encounter (Signed)
I'm going to go ahead and empirically cover her with some cipro- please let me know if no improvement Please call it in

## 2012-10-05 MED ORDER — CIPROFLOXACIN HCL 250 MG PO TABS: 250.0000 mg | ORAL_TABLET | Freq: Two times a day (BID) | ORAL | Status: DC

## 2012-10-05 NOTE — Telephone Encounter (Signed)
Pt notified and Rx sent in to pharmacy.  

## 2012-11-01 ENCOUNTER — Encounter: Payer: Self-pay | Admitting: Family Medicine

## 2012-11-01 ENCOUNTER — Other Ambulatory Visit (HOSPITAL_COMMUNITY)
Admission: RE | Admit: 2012-11-01 | Discharge: 2012-11-01 | Disposition: A | Discharge status: Home / Self Care | Payer: No Typology Code available for payment source | Source: Ambulatory Visit | Attending: Family Medicine | Admitting: Family Medicine

## 2012-11-01 ENCOUNTER — Ambulatory Visit (INDEPENDENT_AMBULATORY_CARE_PROVIDER_SITE_OTHER): Payer: PRIVATE HEALTH INSURANCE | Admitting: Family Medicine

## 2012-11-01 ENCOUNTER — Encounter: Payer: Self-pay | Admitting: Internal Medicine

## 2012-11-01 VITALS — BP 126/78 | HR 88 | Temp 98.6°F | Ht 61.25 in | Wt 230.8 lb

## 2012-11-01 DIAGNOSIS — Z1151 Encounter for screening for human papillomavirus (HPV): Secondary | ICD-10-CM | POA: Insufficient documentation

## 2012-11-01 DIAGNOSIS — E039 Hypothyroidism, unspecified: Secondary | ICD-10-CM

## 2012-11-01 DIAGNOSIS — E119 Type 2 diabetes mellitus without complications: Secondary | ICD-10-CM

## 2012-11-01 DIAGNOSIS — Z1211 Encounter for screening for malignant neoplasm of colon: Secondary | ICD-10-CM

## 2012-11-01 DIAGNOSIS — Z23 Encounter for immunization: Secondary | ICD-10-CM

## 2012-11-01 DIAGNOSIS — N950 Postmenopausal bleeding: Secondary | ICD-10-CM

## 2012-11-01 DIAGNOSIS — Z01419 Encounter for gynecological examination (general) (routine) without abnormal findings: Secondary | ICD-10-CM

## 2012-11-01 DIAGNOSIS — I10 Essential (primary) hypertension: Secondary | ICD-10-CM

## 2012-11-01 DIAGNOSIS — Z0000 Encounter for general adult medical examination without abnormal findings: Secondary | ICD-10-CM

## 2012-11-01 NOTE — Assessment & Plan Note (Signed)
In obese pt with recent wt loss Ref for pelvic US

## 2012-11-01 NOTE — Assessment & Plan Note (Signed)
Reviewed health habits including diet and exercise and skin cancer prevention Also reviewed health mt list, fam hx and immunizations  Pt will schedule her own mammog Pneumonia vaccine today

## 2012-11-01 NOTE — Assessment & Plan Note (Signed)
Some scant bleeding noted  Ref for pelvic US Pap done

## 2012-11-01 NOTE — Patient Instructions (Addendum)
We will refer you for colonoscopy as well as pelvic ultrasound at check out Pneumonia vaccine today  Take care of yourself - keep loosing weight! Follow up in 6 months with labs prior Do not forget to schedule your own mammogram and also eye exam

## 2012-11-01 NOTE — Progress Notes (Signed)
Subjective:    Patient ID: Taylor Grimes, female    DOB: Jun 28, 1961, 52 y.o.   MRN: [id number]  HPI Here for health maintenance exam and to review chronic medical problems    Wt is down another 5 lb with bmi of 43 Has lost 20 lb so far by her scales Is happy- working hard at it   Colon cancer screening - wants to go ahead with a colonoscopy  No symptoms   Mammogram 1/11-needs to get that done -- she will get the 3 D mammogram this time  Self exam- no lumps (but does have dense breasts)   Pap 12/10 No menses in 3 years  Friday - she did have a little bit of bleeding / one clot and it stopped  Sister had pre cancerous endometrium in past   imms up to date Has not had pneumovax -will get that today  Mood-has been ok / motivated/ not depressed   DM- much imp last visit Lab Results  Component Value Date   HGBA1C 6.3 09/28/2012    bp is stable today  No cp or palpitations or headaches or edema  No side effects to medicines  BP Readings from Last 3 Encounters:  11/01/12 126/78  09/28/12 108/68  06/30/12 140/80     Lab Results  Component Value Date   ALT 63* 09/28/2012   AST 51* 09/28/2012   ALKPHOS 78 09/28/2012   BILITOT 0.9 09/28/2012   Exp imp with further wt loss   Patient Active Problem List  Diagnosis  . HYPOTHYROIDISM  . LEUKOCYTOSIS  . HYPERTENSION  . TREMOR  . TRANSAMINASES, SERUM, ELEVATED  . Obesity  . Varicose veins of leg with swelling, left  . Grief reaction  . DM type 2 (diabetes mellitus, type 2)  . Abdominal  pain, other specified site  . Abnormal urine finding  . Routine general medical examination at a health care facility  . Post-menopausal bleeding  . Encounter for routine gynecological examination  . Colon cancer screening   Past Medical History  Diagnosis Date  . UTI (urinary tract infection)   . Anxiety   . Breast lesion     benign  . Hypothyroid   . HTN (hypertension)   . Elevated transaminase level     ? fatty liver   . Diabetes  mellitus without complication    Past Surgical History  Procedure Laterality Date  . Breast biopsy      benign cyst 2011   History  Substance Use Topics  . Smoking status: Former Smoker    Quit date: 07/22/1979  . Smokeless tobacco: Not on file     Comment: briefly smoked at 16  . Alcohol Use: Yes     Comment: rare   Family History  Problem Relation Age of Onset  . Hypothyroidism Mother   . Alzheimer's disease Mother   . Stroke Father    No Known Allergies Current Outpatient Prescriptions on File Prior to Visit  Medication Sig Dispense Refill  . levothyroxine (SYNTHROID, LEVOTHROID) 200 MCG tablet Take 1 tablet (200 mcg total) by mouth daily.  30 tablet  6  . levothyroxine (SYNTHROID, LEVOTHROID) 50 MCG tablet Take 1 tablet (50 mcg total) by mouth daily. In addition to the 200 mcg dose taken daily  30 tablet  6  . losartan (COZAAR) 50 MG tablet Take 1 tablet (50 mg total) by mouth daily.  30 tablet  6  . metFORMIN (GLUCOPHAGE) 500 MG tablet Take 1 tablet (500 mg  total) by mouth 2 (two) times daily with a meal.  60 tablet  5   No current facility-administered medications on file prior to visit.     Review of Systems     Objective:   Physical Exam  Constitutional: She appears well-developed and well-nourished. No distress.  obese and well appearing   HENT:  Head: Normocephalic and atraumatic.  Right Ear: External ear normal.  Left Ear: External ear normal.  Nose: Nose normal.  Mouth/Throat: Oropharynx is clear and moist.  Eyes: Conjunctivae and EOM are normal. Pupils are equal, round, and reactive to light. Right eye exhibits no discharge. Left eye exhibits no discharge. No scleral icterus.  Neck: Normal range of motion. Neck supple. No JVD present. Carotid bruit is not present. No thyromegaly present.  Cardiovascular: Normal rate, regular rhythm, normal heart sounds and intact distal pulses.  Exam reveals no gallop.   Pulmonary/Chest: Effort normal and breath sounds  normal. No respiratory distress. She has no wheezes.  Abdominal: Soft. Bowel sounds are normal. She exhibits no distension, no abdominal bruit and no mass. There is no tenderness.  Genitourinary: Uterus normal. No breast swelling, tenderness, discharge or bleeding. There is no rash, tenderness or lesion on the right labia. There is no rash, tenderness or lesion on the left labia. Uterus is not enlarged and not tender. Cervix exhibits no motion tenderness and no friability. Right adnexum displays no mass, no tenderness and no fullness. Left adnexum displays no mass, no tenderness and no fullness. There is bleeding around the vagina. No tenderness around the vagina. No vaginal discharge found.  Breast exam: No mass, nodules, thickening, tenderness, bulging, retraction, inflamation, nipple discharge or skin changes noted.  No axillary or clavicular LA.  Chaperoned exam.   Scant uterine bleeding noted on exam No tenderness  Musculoskeletal: Normal range of motion. She exhibits no edema and no tenderness.  Lymphadenopathy:    She has no cervical adenopathy.  Neurological: She is alert. She has normal reflexes. No cranial nerve deficit. She exhibits normal muscle tone. Coordination normal.  Skin: Skin is warm and dry. No rash noted. No erythema. No pallor.  Psychiatric: She has a normal mood and affect.          Assessment & Plan:

## 2012-11-01 NOTE — Assessment & Plan Note (Signed)
bp in fair control at this time  No changes needed  Disc lifstyle change with low sodium diet and exercise   Rev labs 

## 2012-11-01 NOTE — Assessment & Plan Note (Signed)
Ref for screening colonosc  

## 2012-11-01 NOTE — Assessment & Plan Note (Signed)
Improved  Enc to keep loosing wt  Pt will schedule own eye exam

## 2012-11-01 NOTE — Assessment & Plan Note (Signed)
Lab Results  Component Value Date   TSH 0.94 06/23/2012   No symptoms

## 2012-11-05 ENCOUNTER — Ambulatory Visit: Payer: Self-pay | Admitting: Family Medicine

## 2012-11-08 ENCOUNTER — Encounter: Payer: Self-pay | Admitting: Family Medicine

## 2012-11-09 ENCOUNTER — Telehealth: Payer: Self-pay | Admitting: Family Medicine

## 2012-11-09 ENCOUNTER — Other Ambulatory Visit: Payer: Self-pay

## 2012-11-09 ENCOUNTER — Encounter: Payer: Self-pay | Admitting: *Deleted

## 2012-11-09 DIAGNOSIS — Z1231 Encounter for screening mammogram for malignant neoplasm of breast: Secondary | ICD-10-CM

## 2012-11-09 NOTE — Telephone Encounter (Signed)
Caller: Taylor Grimes/Patient; Phone: [phone]; Reason for Call: Patient returning call from office staff.  Per Epic, staff was asked to notify patient that the cytology PAP was normal.  Patient advised; also will be getting a letter in the mail to that effect.  No questions or concerns at this time.  Krs/can

## 2012-11-10 ENCOUNTER — Telehealth: Payer: Self-pay | Admitting: Family Medicine

## 2012-11-10 DIAGNOSIS — R9389 Abnormal findings on diagnostic imaging of other specified body structures: Secondary | ICD-10-CM

## 2012-11-10 DIAGNOSIS — N8502 Endometrial intraepithelial neoplasia [EIN]: Secondary | ICD-10-CM | POA: Insufficient documentation

## 2012-11-10 DIAGNOSIS — N950 Postmenopausal bleeding: Secondary | ICD-10-CM

## 2012-11-10 NOTE — Telephone Encounter (Signed)
I was calling pt regarding abnormal Ultrasound results not her pap smear, I call pt back and advise her of the results

## 2012-11-10 NOTE — Telephone Encounter (Signed)
Ref to gyn

## 2012-11-10 NOTE — Telephone Encounter (Signed)
Message copied by Judy Pimple on Wed Nov 10, 2012  9:43 AM ------      Message from: Shon Millet      Created: Wed Nov 10, 2012  9:20 AM       Pt notified of [place] results an agrees with referral, pt doesn't have a gyn she goes to so will go to whoever you refer her too ------

## 2012-11-15 ENCOUNTER — Ambulatory Visit (INDEPENDENT_AMBULATORY_CARE_PROVIDER_SITE_OTHER): Payer: PRIVATE HEALTH INSURANCE | Admitting: Family Medicine

## 2012-11-15 ENCOUNTER — Other Ambulatory Visit: Payer: Self-pay | Admitting: Family Medicine

## 2012-11-15 ENCOUNTER — Encounter: Payer: Self-pay | Admitting: Family Medicine

## 2012-11-15 VITALS — BP 126/100 | HR 77 | Resp 16 | Ht 61.0 in | Wt 234.0 lb

## 2012-11-15 DIAGNOSIS — N950 Postmenopausal bleeding: Secondary | ICD-10-CM

## 2012-11-15 NOTE — Progress Notes (Signed)
Subjective:    Patient ID: Taylor Grimes, female    DOB: July 09, 1961, 52 y.o.   MRN: [id number]  HPI  Pt. Is a new pt, referred by Dr. Milinda Antis, after disclosing at time of PE, she had had some post-menopausal bleeding.  She reports LMP of 4-5 years ago, then some pain and cramping and passing of large amount of clot.  Dr. Milinda Antis ordered pelvic sono, results of which are available and reviewed today.  Sono shows enlarged uterus at 13 cm, markedly  Thickened endometrium at 3.2 cm, and 9.1 cm pelvic mass in the left adnexa.  Had negative pap smear.  Past Medical History  Diagnosis Date  . UTI (urinary tract infection)   . Anxiety   . Breast lesion     benign  . Hypothyroid   . HTN (hypertension)   . Elevated transaminase level     ? fatty liver   . Diabetes mellitus without complication    Past Surgical History  Procedure Laterality Date  . Breast biopsy      benign cyst 2011  . Cesarean section     Current Outpatient Prescriptions on File Prior to Visit  Medication Sig Dispense Refill  . levothyroxine (SYNTHROID, LEVOTHROID) 200 MCG tablet Take 1 tablet (200 mcg total) by mouth daily.  30 tablet  6  . levothyroxine (SYNTHROID, LEVOTHROID) 50 MCG tablet Take 1 tablet (50 mcg total) by mouth daily. In addition to the 200 mcg dose taken daily  30 tablet  6  . losartan (COZAAR) 50 MG tablet Take 1 tablet (50 mg total) by mouth daily.  30 tablet  6  . metFORMIN (GLUCOPHAGE) 500 MG tablet Take 1 tablet (500 mg total) by mouth 2 (two) times daily with a meal.  60 tablet  5   No current facility-administered medications on file prior to visit.   No Known Allergies Family History  Problem Relation Age of Onset  . Hypothyroidism Mother   . Alzheimer's disease Mother   . Stroke Father   . Deep vein thrombosis Sister    History   Social History  . Marital Status: Legally Separated    Spouse Name: N/A    Number of Children: 2  . Years of Education: N/A   Occupational History  . shift  manager  [employer]   Social History Main Topics  . Smoking status: Former Smoker    Quit date: 07/22/1979  . Smokeless tobacco: Not on file     Comment: briefly smoked at 16  . Alcohol Use: Yes     Comment: rare  . Drug Use: No  . Sexually Active: Yes    Birth Control/ Protection: Post-menopausal   Other Topics Concern  . Not on file   Social History Narrative  . No narrative on file      Review of Systems  Constitutional: Negative for fever and chills.  Respiratory: Negative for chest tightness and shortness of breath.   Cardiovascular: Negative for chest pain.  Gastrointestinal: Positive for abdominal pain (RLQ with recumbency). Negative for nausea and vomiting.       Denies bloating  Genitourinary: Positive for vaginal bleeding and vaginal discharge. Negative for hematuria and pelvic pain.  Skin: Negative for rash.  Neurological: Negative for dizziness and headaches.       Objective:   Physical Exam  Vitals reviewed. Constitutional: She is oriented to person, place, and time. She appears well-developed and well-nourished. No distress.  obese  HENT:  Head: Normocephalic and atraumatic.  Eyes: No scleral icterus.  Neck: Neck supple.  Cardiovascular: Normal rate and regular rhythm.   No murmur heard. Pulmonary/Chest: Effort normal and breath sounds normal. She has no wheezes.  Abdominal: Soft. There is no tenderness.  Genitourinary:  NEFG, BUS is WNL, vagina is pink and ruggated.  Cervix is small, without lesion and is somewhat retracted and pulled up.  There is a large uterus + mass filling the pelvis which is firm and non-mobile.  Musculoskeletal: Normal range of motion. She exhibits no tenderness.  Neurological: She is alert and oriented to person, place, and time.  Skin: Skin is warm and dry.  Psychiatric: She has a normal mood and affect.          Assessment & Plan:

## 2012-11-15 NOTE — Assessment & Plan Note (Addendum)
Given thickness and obesity and mass--findings are very concerning for endometrial cancer.  This was discussed as a probable diagnosis with the patient.  Additionally, if EMB is negative, next move would be to D and C.  She is understanding of this.  If need be, referral to GYN/Onc will be made.  We will call patient with results as soon as possible.

## 2012-11-15 NOTE — Addendum Note (Signed)
Addended by: Barbara Cower on: 11/15/2012 05:14 PM   Modules accepted: Orders

## 2012-11-15 NOTE — Patient Instructions (Addendum)
Postmenopausal Bleeding Menopause is commonly referred to as the "change in life." It is a time when the fertile years, the time of ovulating and having menstrual periods, has come to an end. It is also determined by not having menstrual periods for 12 months.  Postmenopausal bleeding is any bleeding a woman has after she has entered into menopause. Any type of postmenopausal bleeding, even if it appears to be a typical menstrual period, is concerning. This should be evaluated by your caregiver.  CAUSES   Hormone therapy.  Cancer of the cervix or cancer of the lining of the uterus (endometrial cancer).  Thinning of the uterine lining (uterine atrophy).  Thyroid diseases.  Certain medicines.  Infection of the uterus or cervix.  Inflammation or irritation of the uterine lining (endometritis).  Estrogen-secreting tumors.  Growths (polyps) on the cervix, uterine lining, or uterus.  Uterine tumors (fibroids).  Being very overweight (obese). DIAGNOSIS  Your caregiver will take a medical history and ask questions. A physical exam will also be performed. Further tests may include:   A transvaginal ultrasound. An ultrasound wand or probe is inserted into your vagina to view the pelvic organs.  A biopsy of the lining of the uterus (endometrium). A sample of the endometrium is removed and examined.  A hysteroscopy. Your caregiver may use an instrument with a light and a camera attached to it (hysteroscope). The hysteroscope is used to look inside the uterus for problems.  A dilation and curettage (D&C). Tissue is removed from the uterine lining to be examined for problems. TREATMENT  Treatment depends on the cause of the bleeding. Some treatments include:   Surgery.  Medicines.  Hormones.  A hysteroscopy or D&C to remove polyps or fibroids.  Changing or stopping a current medicine you are taking. Talk to your caregiver about your specific treatment. HOME CARE INSTRUCTIONS    Maintain a healthy weight.  Keep regular pelvic exams and Pap tests. SEEK MEDICAL CARE IF:   You have bleeding, even if it is light in comparison to your previous periods.  Your bleeding lasts more than 1 week.  You have abdominal pain.  You develop bleeding with sexual intercourse. SEEK IMMEDIATE MEDICAL CARE IF:   You have a fever, chills, headache, dizziness, muscle aches, and bleeding.  You have severe pain with bleeding.  You are passing blood clots.  You have bleeding and need more than 1 pad an hour.  You feel faint. MAKE SURE YOU:  Understand these instructions.  Will watch your condition.  Will get help right away if you are not doing well or get worse. Document Released: 10/15/2005 Document Revised: 09/29/2011 Document Reviewed: 03/13/2011 ExitCare Patient Information 2013 [employer]. Endometrial Biopsy This is a test in which a tissue sample (a biopsy) is taken from inside the uterus (womb). It is then looked at by a specialist under a microscope to see if the tissue is normal or abnormal. The endometrium is the lining of the uterus. This test helps determine where you are in your menstrual cycle and how hormone levels are affecting the lining of the uterus. Another use for this test is to diagnose endometrial cancer, tuberculosis, polyps, or inflammatory conditions and to evaluate uterine bleeding. PREPARATION FOR TEST No preparation or fasting is necessary. NORMAL FINDINGS No pathologic conditions. Presence of "secretory-type" endometrium 3 to 5 days before to normal menstruation. Ranges for normal findings may vary among different laboratories and hospitals. You should always check with your doctor after having lab work   or other tests done to discuss the meaning of your test results and whether your values are considered within normal limits. MEANING OF TEST  Your caregiver will go over the test results with you and discuss the importance and meaning of  your results, as well as treatment options and the need for additional tests if necessary. OBTAINING THE TEST RESULTS It is your responsibility to obtain your test results. Ask the lab or department performing the test when and how you will get your results. Document Released: 11/07/2004 Document Revised: 09/29/2011 Document Reviewed: 06/16/2008 ExitCare Patient Information 2013 [employer].  

## 2012-11-17 ENCOUNTER — Telehealth: Payer: Self-pay | Admitting: *Deleted

## 2012-11-17 NOTE — Telephone Encounter (Signed)
Left message on patients cell number for her to call me back regarding her test results.

## 2012-11-18 ENCOUNTER — Ambulatory Visit (INDEPENDENT_AMBULATORY_CARE_PROVIDER_SITE_OTHER): Payer: PRIVATE HEALTH INSURANCE | Admitting: Obstetrics & Gynecology

## 2012-11-18 ENCOUNTER — Encounter: Payer: Self-pay | Admitting: Obstetrics & Gynecology

## 2012-11-18 DIAGNOSIS — N8502 Endometrial intraepithelial neoplasia [EIN]: Secondary | ICD-10-CM

## 2012-11-18 DIAGNOSIS — E669 Obesity, unspecified: Secondary | ICD-10-CM

## 2012-11-18 DIAGNOSIS — N950 Postmenopausal bleeding: Secondary | ICD-10-CM

## 2012-11-18 HISTORY — PX: LAPAROSCOPIC TOTAL HYSTERECTOMY: SUR800

## 2012-11-18 NOTE — Progress Notes (Signed)
GYNECOLOGY CLINIC PROGRESS NOTE  Taylor Grimes is a 52 y.o. G3P3 patient evaluated for postmenopausal bleeding by endometrial biopsy on 11/15/12 by Dr. Tinnie Gens.  See report below:  11/15/12 Endometrium Biopsy AT LEAST COMPLEX ATYPICAL HYPERPLASIA, SEE COMMENT. Microscopic Comment Sections show complex atypical hyperplasia. There is a focal microscopic focus which is suspicious for well differentiated adenocarcinoma. Squamous metaplasia is present. The findings were called to Dr. Shawnie Pons on 11/17/2012. Dr. Luisa Hart has seen this case in consultation with agreement of the above diagnosis and comment. (RAH:caf 11/17/12) - Read by Zandra Abts MD  Results discussed with patient, all questions answered.  Given concern about focus of adenocarcinoma, patient was referred to GYN Oncology.  She lives closer to [facility]; the office there was called and an appointment was made for the patient with Dr. Laurette Schimke on 11/26/12 at 10 am at the [facility] Gynecology Oncology office,  [address], [address] [place].  Patient information faxed to the clinic; Pathology called and slides were requested to be sent to [facility].      Patient was given the information about her appointment.  Appropriate support given to patient.  Total encounter time: 25 minutes   Jaynie Collins, MD, FACOG Attending Obstetrician & Gynecologist Faculty Practice, [facility] [place]

## 2012-11-18 NOTE — Patient Instructions (Addendum)
Appointment with Dr. Laurette Schimke on Friday 11/26/12 at 10:00am  Make time for parking  [facility]  [facility] [address] [place]; [place] [address]  1st floor, Clinic [address] - Gynecology Oncology  [phone]  Cancer of the Uterus The uterus is part of a woman's reproductive system. It is the hollow, pear-shaped organ where a baby grows. The uterus is in the pelvis between the bladder and the rectum. The narrow, lower portion of the uterus is the cervix. The fallopian tubes extend from either side of the top of the uterus to the ovaries. The wall of the uterus has two layers of tissue. The inner layer, or lining, is the endometrium. The outer layer is muscle tissue called the myometrium. In women of childbearing age, the lining of the uterus grows and thickens each month to prepare for pregnancy. If a woman does not become pregnant, the thick, bloody lining flows out of the body through the vagina. This flow is called menstruation. TYPES OF UTERINE CANCER  The most common type of cancer of the uterus begins in the lining (endometrium). It is called endometrial cancer, uterine cancer, or cancer of the uterus. It is seen in 2% to 3% of women. (Adenocarcinoma)  A different type of cancer, uterine sarcoma, develops in the muscle (myometrium). Cancer that begins in the cervix is also a different type of cancer.  Rarely, a noncancerous fibroid tumor of the uterus develops into a sarcoma. CAUSES  No one knows the exact causes of uterine cancer. But it is clear that this disease is not contagious. No one can "catch" cancer from another person. Women who get this disease are more likely than other women to have certain risk factors. A risk factor is something that increases a person's chance of developing the disease.  Most women who have known risk factors do not get uterine cancer. On the other hand, many who do get this disease have none of these factors. Doctors can  seldom explain why one woman gets uterine cancer and another does not.  Studies have found the following risk factors:  Age. Cancer of the uterus occurs mostly in women over age 23.  Endometrial hyperplasia (enlarged endometrium). The risk of uterine cancer is higher if a woman has endometrial hyperplasia.  Hormone replacement therapy (HRT). HRT is used to control the symptoms of menopause, to prevent osteoporosis (thinning of the bones), and to reduce the risk of heart disease or stroke. Women who still have their uterus, and use estrogen without progesterone, have an increased risk of uterine cancer. Long-term use and large doses of estrogen seem to increase this risk. Women who use a combination of estrogen and progesterone have a lower risk of uterine cancer than women who use estrogen alone. The progesterone protects the uterus from developing cancer.  Obesity and related conditions. The body stores and releases some of its estrogen in fatty tissue. That is why obese women are more likely than thin women to have higher levels of estrogen in their bodies. High levels of estrogen may be the reason that obese women have an increased risk of developing uterine cancer. The risk of this disease is also higher in women with diabetes or high blood pressure. These conditions occur in many obese women.  Tamoxifen. Women taking the drug tamoxifen to prevent or treat breast cancer have an increased risk of uterine cancer. This risk appears to be related to the estrogen-like effect of this drug on the uterus.  Race. White women are more likely than African-American women to get uterine cancer.  Colorectal cancer. Women who have had an inherited form of colorectal cancer have a higher risk of developing uterine cancer than other women.  Infertility.  Beginning menstrual periods before age 18.  Having menstrual periods after age 2.  History of cancer of the ovary or intestine.  Family history of  uterine cancer.  Having diabetes, high blood pressure, thyroid or gallbladder disease.  Long-term use of high does of birth control pills. Birth control pills today are low in hormone doses.  Radiation to the abdomen or pelvis.  Smoking. SYMPTOMS  Uterine cancer usually occurs after menopause. But it may also occur around the time that menopause begins. Abnormal vaginal bleeding is the most common symptom of uterine cancer. Bleeding may start as a watery, blood-streaked flow that gradually contains more blood. Women should not assume that abnormal vaginal bleeding is part of menopause. A woman should see her caregiver if she has any of the following symptoms:  Unusual vaginal bleeding or discharge.  Difficult or painful urination.  Pain during intercourse.  Pain in the pelvic area.  Increased girth (growth) of the stomach.  Any vaginal bleeding after menopause.  Unexplained weight loss. These symptoms can be caused by cancer or other less serious conditions. Most often they are not cancer. But a thorough evaluation is needed to be certain. DIAGNOSIS  If a woman has symptoms that suggest uterine cancer, her caregiver may check her general health and may order blood and urine tests. The caregiver also may perform one or more of these exams or tests.  Blood and urine tests and chest x-rays. The woman also may have:  Other X-rays.  CT scans.  Ultrasound test.  Magnetic resonance imaging (MRI).  Sigmoidoscopy.  Colonoscopy.  Pelvic exam. A woman will have a pelvic exam to check the vagina, uterus, bladder, and rectum. The caregiver feels these organs for any lumps or changes in their shape or size. To see the upper part of the vagina and the cervix, the caregiver inserts an instrument called a speculum into the vagina.  Pap test. The caregiver collects cells from the cervix and upper vagina. A medical laboratory checks for abnormal cells. The Pap test is better for detecting  cancer of the cervix. But cells from inside the uterus usually do not show up on a Pap test. It is not a reliable test for uterine cancer.  Transvaginal ultrasound. The medical caregiver inserts an instrument into the vagina. The instrument aims high-frequency sound waves at the uterus. The pattern of the echoes they produce creates a picture. If the endometrium looks too thick, the caregiver can do a biopsy.  Biopsy. The medical caregiver removes a sample of tissue from the uterine lining. This usually can be done in the caregiver's office.  Dilatation and Curettage (D&C). In some cases, a woman may need to have a D&C. D&C is usually done as same-day surgery with anesthesia in a hospital. A pathologist examines the tissue (lining of the uterus) to check for cancer cells and other conditions. STAGING   If uterine cancer is diagnosed, the caregiver needs to know the stage, or extent, of the disease to plan the best treatment. Staging is a careful attempt to find out whether the cancer has spread, and if so, to what parts of the body.  When uterine cancer spreads (metastasizes) outside the uterus, cancer cells are often found in nearby lymph nodes, nerves, or blood  vessels. If the cancer has reached the lymph nodes, cancer cells may have spread to other lymph nodes and other organs of the body.  Staging is done at the time of surgery. In most cases, the most reliable way to stage this disease is to remove the uterus, cervix, tubes, ovaries, and lymph nodes. A pathologist uses a microscope to examine the uterus and other tissues removed by the surgeon, to determine the extent of the cancer in the pelvis.  If lymph nodes have cancer cells, other parts of the body are examined, to see if it has spread to other organs. MAIN FEATURES OF EACH STAGE OF THE DISEASE: Stage I. The cancer is only in the body of the uterus. It is not in the cervix. Stage II. The cancer has spread from the body of the uterus to  the cervix. Stage III. The cancer has spread outside the uterus, but not outside the pelvis (and not to the bladder or rectum). Lymph nodes in the pelvis may contain cancer cells. Stage IV. The cancer has spread into the bladder or rectum. It may have spread beyond the pelvis to other body parts. TREATMENT  Women with uterine cancer have many treatment options. Most women with uterine cancer are treated with surgery. Some have radiation or chemotherapy. A smaller number of women may be treated with hormonal therapy. Some patients receive a combination of therapies. You may want to consult with another cancer doctor for a second opinion. The caregiver (usually a cancer doctor) is the best person to describe your treatment choices and to discuss the expected results of treatment. SURGERY  Most women with uterine cancer have surgery to remove the uterus, cervix, tubes, and ovaries (total hysterectomy). This is usually done through an incision in the abdomen.  The doctor may also remove the lymph nodes near the tumor, to see if they contain cancer. If cancer cells have reached the lymph nodes, it may mean that the disease has spread to other parts of the body. If cancer cells have not spread beyond the endometrium, the woman may not need to have any other treatment. The length of the hospital stay may vary from several days to a week. RADIATION THERAPY  In radiation therapy, high-energy rays are used to kill cancer cells. Like surgery, radiation therapy is a local therapy. It affects cancer cells only in the treated area.  Some women with Stage I, II, or III uterine cancer need both radiation therapy and surgery. They may have radiation before surgery to shrink the tumor, or after surgery to destroy any cancer cells that remain in the area. The doctor may suggest radiation treatments for the small number of women who cannot have surgery.  Doctors use two types of radiation therapy to treat uterine  cancer:  External radiation. In external radiation therapy, a large machine outside the body is used to aim radiation at the tumor area. The woman usually does not stay overnight (outpatient) at the hospital or clinic, and receives external radiation 5 days a week for several weeks. This schedule helps protect healthy cells and tissue by spreading out the total dose of radiation. No radioactive materials are put into the body for external radiation therapy.  Internal radiation. In internal radiation therapy, tiny tubes containing a radioactive substance are inserted through the vagina and cervix, into the uterus, and left in place for a few days. The woman stays in the hospital during this treatment. To protect others from radiation exposure, the patient  may not be able to have visitors or may have visitors only for a short period of time while the implant is in place. Once the implant is removed, the woman has no radioactivity in her body.  Some patients need both external and internal radiation therapies. CHEMOTHERAPY Chemotherapy is not usually used for endometrial cancer of the uterus. However, with sarcoma of the uterus or of the fibroid, it may be used in combination with surgery. Chemotherapy may also be used with recurring sarcoma, and in patients who cannot have surgery. HORMONE THERAPY Hormonal therapy involves substances that prevent cancer cells from multiplying or growing by attaching to hormone receptors. This causes changes in cancer cells. Before therapy begins, the caregiver may request a hormone receptor test. This special lab test of uterine tissue helps the caregiver learn if estrogen and progesterone receptors are present. If the tissue has receptors, the woman is more likely to respond to hormonal therapy.  Hormonal therapy is called a systemic therapy, because it can affect cancer cells throughout the body. Usually, hormonal therapy is a type of progesterone, taken as a pill or  injection.  The doctor may use hormonal therapy for women with uterine cancer who are unable to have surgery or radiation therapy. Also, the doctor may give hormonal therapy to women with uterine cancer that has spread to the lungs or other distant sites. It is also given to women with uterine cancer that has come back.  Hormonal therapy can cause a number of side effects. Women taking progesterone may retain fluid, have an increased appetite, and gain weight. Women who are still menstruating may have changes in their periods.  Hormone therapy can be used in combination with surgery or radiation. HOME CARE INSTRUCTIONS   Maintain a normal weight with a healthy balanced diet and exercise.  If you have diabetes, high blood pressure, thyroid or gallbladder disease, keep them in control with your caregiver's treatment and recommendations.  Do not smoke.  Do not take estrogen without taking progesterone with it, for menopausal symptoms.  Join a support group or get counseling, if you would like help dealing with your cancer.  If you are on hormone replacement therapy, see your caregiver as recommended, and be informed about the side effects of HRT.  Women with known risk factors should ask their caregiver what symptoms to look for and how often they should have an examination.  Keep your follow-up appointments and take your medicines as advised.  Write your questions down, and take them with you to your caregiver's appointments.  You may want another person to be with you for your appointments, so you do not miss any instructions. SEEK MEDICAL CARE IF:   You have any abnormal vaginal bleeding.  You are having menstrual periods at the age of 74 or older.  You have bleeding after sexual intercourse.  You are taking tomoxifen and develop vaginal bleeding.  Your stomach is growing, and you are not pregnant.  You have pain with sexual intercourse.  You have stomach or pelvis  pain.  You have weight loss for no known reason.  You have pain or difficulty with urination. [facility]  Cancer Information Service (CIS) provides accurate, up-to-date information on cancer to patients and their families, health professionals, and the general public:  Phone: 1-800-4-CANCER ((986)169-4005).  Internet: http://www.cancer.gov NCI's website contains complete information about cancer causes and prevention, screening and diagnosis, treatment and survivorship, clinical trials, statistics, funding, training, and employment opportunities, and the [employer]  and its programs. CLINICAL TRIALS A woman who is interested in being part of a clinical trial should talk with her caregiver. NCI's website (http://www.johnson-fowler.biz/) provides general information about clinical trials. It also offers detailed information about specific ongoing studies of uterine cancer by linking to PDQ, a cancer information database developed by the NCI. The Cancer Information Service at 1-800-4-CANCER can answer questions about cancer and provide information from the PDQ database. Document Released: 07/07/2005 Document Revised: 09/29/2011 Document Reviewed: 05/10/2009 [facility] Patient Information 2013 [place], [place].

## 2012-11-19 ENCOUNTER — Encounter: Payer: Self-pay | Admitting: Obstetrics & Gynecology

## 2012-11-30 ENCOUNTER — Ambulatory Visit: Payer: PRIVATE HEALTH INSURANCE | Admitting: Obstetrics and Gynecology

## 2012-12-10 ENCOUNTER — Ambulatory Visit: Payer: PRIVATE HEALTH INSURANCE

## 2012-12-10 DIAGNOSIS — Z9889 Other specified postprocedural states: Secondary | ICD-10-CM | POA: Insufficient documentation

## 2012-12-15 DIAGNOSIS — Z8542 Personal history of malignant neoplasm of other parts of uterus: Secondary | ICD-10-CM | POA: Insufficient documentation

## 2012-12-15 DIAGNOSIS — C549 Malignant neoplasm of corpus uteri, unspecified: Secondary | ICD-10-CM | POA: Insufficient documentation

## 2012-12-15 HISTORY — DX: Personal history of malignant neoplasm of other parts of uterus: Z85.42

## 2012-12-21 ENCOUNTER — Encounter: Payer: PRIVATE HEALTH INSURANCE | Admitting: Internal Medicine

## 2013-02-01 ENCOUNTER — Encounter: Payer: Self-pay | Admitting: Family Medicine

## 2013-02-01 ENCOUNTER — Ambulatory Visit (INDEPENDENT_AMBULATORY_CARE_PROVIDER_SITE_OTHER): Payer: PRIVATE HEALTH INSURANCE | Admitting: Family Medicine

## 2013-02-01 VITALS — BP 110/72 | HR 94 | Temp 98.2°F | Wt 223.8 lb

## 2013-02-01 DIAGNOSIS — R0989 Other specified symptoms and signs involving the circulatory and respiratory systems: Secondary | ICD-10-CM

## 2013-02-01 DIAGNOSIS — R0609 Other forms of dyspnea: Secondary | ICD-10-CM

## 2013-02-01 DIAGNOSIS — R0600 Dyspnea, unspecified: Secondary | ICD-10-CM | POA: Insufficient documentation

## 2013-02-01 NOTE — Assessment & Plan Note (Signed)
Anticipate multifactorial due to stress, heat, and deconditioning. Recommend ease into work for the first week after 2 mo at home recovering from recent surgery.  Letter to that extent provided today. Lung and heart exam WNL today. To return for further evaluation if persistent dyspnea or anxiety. Lab Results  Component Value Date   TSH 0.94 06/23/2012   Wt Readings from Last 3 Encounters:  02/01/13 223 lb 12 oz (101.492 kg)  11/15/12 234 lb (106.142 kg)  11/01/12 230 lb 12 oz (104.668 kg)  weight loss noted - changed diet and trying to lose weight since surgery.  If persistent dyspnea, will recommend recheck TSH.

## 2013-02-01 NOTE — Progress Notes (Signed)
  Subjective:    Patient ID: Taylor Grimes, female    DOB: 21-Apr-1961, 52 y.o.   MRN: [id number]  HPI CC: anxiety?  Out of work for last 2 months s/p hysterectomy.  Returned to work today.  Had an episode of dyspnea and difficulty breathing.  Some back ache. Lasted about 10 min.  Later had a second episode of dyspnea.  Was packing food, manager was yelling at her when this happened. Had been resting at home.  Denies chest pain, wheezing, palpitations, HA, dizziness.  Denies cough or wheeze.  Works at [employer], indoors, but very hot indoors.  Never had similar sxs. No h/o anxiety (although in her PMH).  Past Medical History  Diagnosis Date  . UTI (urinary tract infection)   . Anxiety   . Breast lesion     benign  . Hypothyroid   . HTN (hypertension)   . Elevated transaminase level     ? fatty liver   . Diabetes mellitus without complication     Review of Systems Per HPI    Objective:   Physical Exam  Nursing note and vitals reviewed. Constitutional: She appears well-developed and well-nourished. No distress.  HENT:  Head: Normocephalic and atraumatic.  Mouth/Throat: Oropharynx is clear and moist. No oropharyngeal exudate.  Eyes: Conjunctivae and EOM are normal. Pupils are equal, round, and reactive to light. No scleral icterus.  Neck: Normal range of motion. Neck supple.  Cardiovascular: Normal rate, regular rhythm, normal heart sounds and intact distal pulses.   No murmur heard. Pulmonary/Chest: Effort normal and breath sounds normal. No respiratory distress. She has no wheezes. She has no rales.  Musculoskeletal: She exhibits edema (tr pedal bilaterally).  Lymphadenopathy:    She has no cervical adenopathy.  Skin: Skin is warm and dry. No rash noted.       Assessment & Plan:

## 2013-02-01 NOTE — Patient Instructions (Signed)
I think this is from combination of heat, stress, and recent deconditioning after surgery. I recommend easing into work for the first week. Let us know if persistent symptoms.

## 2013-02-18 ENCOUNTER — Other Ambulatory Visit: Payer: Self-pay | Admitting: *Deleted

## 2013-02-18 MED ORDER — METFORMIN HCL 500 MG PO TABS: 500.0000 mg | ORAL_TABLET | Freq: Two times a day (BID) | ORAL | Status: DC

## 2013-03-14 ENCOUNTER — Ambulatory Visit
Admission: RE | Admit: 2013-03-14 | Discharge: 2013-03-14 | Disposition: A | Discharge status: Home / Self Care | Payer: PRIVATE HEALTH INSURANCE | Source: Ambulatory Visit

## 2013-03-14 DIAGNOSIS — Z1231 Encounter for screening mammogram for malignant neoplasm of breast: Secondary | ICD-10-CM

## 2013-03-17 ENCOUNTER — Encounter: Payer: Self-pay | Admitting: *Deleted

## 2013-05-02 ENCOUNTER — Telehealth: Payer: Self-pay | Admitting: Family Medicine

## 2013-05-02 DIAGNOSIS — E039 Hypothyroidism, unspecified: Secondary | ICD-10-CM

## 2013-05-02 DIAGNOSIS — I10 Essential (primary) hypertension: Secondary | ICD-10-CM

## 2013-05-02 DIAGNOSIS — E119 Type 2 diabetes mellitus without complications: Secondary | ICD-10-CM

## 2013-05-02 NOTE — Telephone Encounter (Signed)
Message copied by Judy Pimple on Mon May 02, 2013  5:46 PM ------      Message from: Alvina Chou      Created: Fri Apr 22, 2013 12:23 PM      Regarding: Lab orders for Tuesday, 10.14.14       Lab orders for 6 month f/u ------

## 2013-05-03 ENCOUNTER — Other Ambulatory Visit (INDEPENDENT_AMBULATORY_CARE_PROVIDER_SITE_OTHER): Payer: PRIVATE HEALTH INSURANCE

## 2013-05-03 DIAGNOSIS — E039 Hypothyroidism, unspecified: Secondary | ICD-10-CM

## 2013-05-03 DIAGNOSIS — E669 Obesity, unspecified: Secondary | ICD-10-CM

## 2013-05-03 DIAGNOSIS — I10 Essential (primary) hypertension: Secondary | ICD-10-CM

## 2013-05-03 DIAGNOSIS — E119 Type 2 diabetes mellitus without complications: Secondary | ICD-10-CM

## 2013-05-03 DIAGNOSIS — R7402 Elevation of levels of lactic acid dehydrogenase (LDH): Secondary | ICD-10-CM

## 2013-05-03 DIAGNOSIS — R7401 Elevation of levels of liver transaminase levels: Secondary | ICD-10-CM

## 2013-05-03 LAB — LIPID PANEL
Cholesterol: 143 mg/dL (ref 0–200)
HDL: 30.8 mg/dL — ABNORMAL LOW (ref >39.00)
LDL Cholesterol: 94 mg/dL (ref 0–99)
Total CHOL/HDL Ratio: 5
Triglycerides: 90 mg/dL (ref 0.0–149.0)
VLDL: 18 mg/dL (ref 0.0–40.0)

## 2013-05-03 LAB — COMPREHENSIVE METABOLIC PANEL
ALT: 23 U/L (ref 0–35)
AST: 22 U/L (ref 0–37)
Albumin: 3.2 g/dL — ABNORMAL LOW (ref 3.5–5.2)
Alkaline Phosphatase: 99 U/L (ref 39–117)
BUN: 12 mg/dL (ref 6–23)
CO2: 27 mEq/L (ref 19–32)
Calcium: 9.5 mg/dL (ref 8.4–10.5)
Chloride: 102 mEq/L (ref 96–112)
Creatinine, Ser: 0.7 mg/dL (ref 0.4–1.2)
GFR: 91.09 mL/min (ref >60.00)
Glucose, Bld: 106 mg/dL — ABNORMAL HIGH (ref 70–99)
Potassium: 4 mEq/L (ref 3.5–5.1)
Sodium: 138 mEq/L (ref 135–145)
Total Bilirubin: 0.9 mg/dL (ref 0.3–1.2)
Total Protein: 8.2 g/dL (ref 6.0–8.3)

## 2013-05-03 LAB — HEMOGLOBIN A1C: Hgb A1c MFr Bld: 6.5 % (ref 4.6–6.5)

## 2013-05-03 LAB — TSH: TSH: 0.03 u[IU]/mL — ABNORMAL LOW (ref 0.35–5.50)

## 2013-05-05 ENCOUNTER — Other Ambulatory Visit: Payer: Self-pay | Admitting: Family Medicine

## 2013-05-05 MED ORDER — LOSARTAN POTASSIUM 50 MG PO TABS: 50.0000 mg | ORAL_TABLET | Freq: Every day | ORAL | Status: DC

## 2013-05-05 MED ORDER — LEVOTHYROXINE SODIUM 50 MCG PO TABS: 50.0000 ug | ORAL_TABLET | Freq: Every day | ORAL | Status: DC

## 2013-05-05 MED ORDER — LEVOTHYROXINE SODIUM 200 MCG PO TABS: 200.0000 ug | ORAL_TABLET | Freq: Every day | ORAL | Status: DC

## 2013-05-06 ENCOUNTER — Ambulatory Visit (INDEPENDENT_AMBULATORY_CARE_PROVIDER_SITE_OTHER): Payer: PRIVATE HEALTH INSURANCE | Admitting: Family Medicine

## 2013-05-06 ENCOUNTER — Encounter: Payer: Self-pay | Admitting: Family Medicine

## 2013-05-06 VITALS — BP 112/68 | HR 86 | Temp 98.7°F | Ht 61.0 in | Wt 215.5 lb

## 2013-05-06 DIAGNOSIS — N390 Urinary tract infection, site not specified: Secondary | ICD-10-CM

## 2013-05-06 DIAGNOSIS — R350 Frequency of micturition: Secondary | ICD-10-CM

## 2013-05-06 DIAGNOSIS — J069 Acute upper respiratory infection, unspecified: Secondary | ICD-10-CM

## 2013-05-06 LAB — POCT UA - MICROSCOPIC ONLY
Casts, Ur, LPF, POC: 0
Yeast, UA: 0

## 2013-05-06 LAB — POCT URINALYSIS DIPSTICK
Glucose, UA: NEGATIVE
Ketones, UA: NEGATIVE
Nitrite, UA: POSITIVE
Spec Grav, UA: 1.02
Urobilinogen, UA: 2
pH, UA: 6

## 2013-05-06 MED ORDER — CIPROFLOXACIN HCL 500 MG PO TABS: 500.0000 mg | ORAL_TABLET | Freq: Two times a day (BID) | ORAL | Status: DC

## 2013-05-06 NOTE — Patient Instructions (Signed)
Take cipro 1 pill twice daily for a week  Drink lots of water  If worse or fever goes up  - let me know Follow up on Monday as planned

## 2013-05-06 NOTE — Progress Notes (Signed)
  Subjective:    Patient ID: Taylor Grimes, female    DOB: [birth year], 52 y.o.   MRN: [id number]  HPI Here for fever with cough and urine frequency  Is running a fever only at night  Went up to 102   A little cough- occ prod of yellow phlegm  Today sneezing -just a bit  No sinus or throat or ear pain    Had to put a stent in her bladder after her hysterectomy- she has had some discomfort ever since she had the stent came out  Sugar is ok  Urine frequency and urgency  No blood in urine     Review of Systems Review of Systems  Constitutional: Negative for fever, appetite change,  and unexpected weight change.  ENT pos for congestion and rhinorrhea/ neg for ST Eyes: Negative for pain and visual disturbance.  Respiratory: Negative for wheeze and shortness of breath.   Cardiovascular: Negative for cp or palpitations    Gastrointestinal: Negative for nausea, diarrhea and constipation.  Genitourinary: pos for urgency and frequency. neg for hematuria or flank pain  Skin: Negative for pallor or rash   Neurological: Negative for weakness, light-headedness, numbness and headaches.  Hematological: Negative for adenopathy. Does not bruise/bleed easily.  Psychiatric/Behavioral: Negative for dysphoric mood. The patient is not nervous/anxious.         Objective:   Physical Exam  Constitutional: She appears well-developed and well-nourished. No distress.  obese and well appearing   HENT:  Head: Normocephalic and atraumatic.  Right Ear: External ear normal.  Left Ear: External ear normal.  Mouth/Throat: Oropharynx is clear and moist.  Nares are injected and congested  No sinus tenderness  Eyes: Conjunctivae and EOM are normal. Pupils are equal, round, and reactive to light. Right eye exhibits no discharge. Left eye exhibits no discharge. No scleral icterus.  Neck: Normal range of motion. Neck supple. No JVD present. No thyromegaly present.  Cardiovascular: Normal rate, regular rhythm and  intact distal pulses.   Pulmonary/Chest: Effort normal and breath sounds normal. No respiratory distress. She has no wheezes. She has no rales.  Harsh bs  Abdominal: Soft. Bowel sounds are normal. She exhibits no distension and no mass. There is tenderness in the suprapubic area. There is no CVA tenderness.  Mild suprapubic tenderness   Musculoskeletal: She exhibits no edema and no tenderness.  Lymphadenopathy:    She has no cervical adenopathy.  Neurological: She is alert. She has normal reflexes. No cranial nerve deficit. She exhibits normal muscle tone. Coordination normal.  Skin: Skin is warm and dry. No rash noted. No erythema. No pallor.  Psychiatric: She has a normal mood and affect.          Assessment & Plan:

## 2013-05-06 NOTE — Assessment & Plan Note (Signed)
Mild/ (? If assoc with the fever given pt has uti) Disc symptomatic care - see instructions on AVS  Rest and fluids Handout given  Reassuring exam Update if not starting to improve in a week or if worsening

## 2013-05-06 NOTE — Assessment & Plan Note (Signed)
Dip ua very pos with foul odor In light of fever (and recent bladder stent)- tx with high dose cipro for a week  ucx pend Enc water  F/u Monday as planned -will re check urine

## 2013-05-09 ENCOUNTER — Ambulatory Visit (INDEPENDENT_AMBULATORY_CARE_PROVIDER_SITE_OTHER): Payer: PRIVATE HEALTH INSURANCE | Admitting: Family Medicine

## 2013-05-09 ENCOUNTER — Encounter: Payer: Self-pay | Admitting: Family Medicine

## 2013-05-09 VITALS — BP 118/76 | HR 82 | Temp 98.6°F | Ht 61.0 in | Wt 216.0 lb

## 2013-05-09 DIAGNOSIS — I10 Essential (primary) hypertension: Secondary | ICD-10-CM

## 2013-05-09 DIAGNOSIS — Z23 Encounter for immunization: Secondary | ICD-10-CM

## 2013-05-09 DIAGNOSIS — E039 Hypothyroidism, unspecified: Secondary | ICD-10-CM

## 2013-05-09 DIAGNOSIS — E119 Type 2 diabetes mellitus without complications: Secondary | ICD-10-CM

## 2013-05-09 DIAGNOSIS — N390 Urinary tract infection, site not specified: Secondary | ICD-10-CM

## 2013-05-09 LAB — URINE CULTURE: Colony Count: >=100000

## 2013-05-09 NOTE — Assessment & Plan Note (Signed)
Low tsh  Also some inc in tremor  Will dec levothyroxine dose by 25 mcg Re check tsh in 1 mo

## 2013-05-09 NOTE — Progress Notes (Signed)
Subjective:    Patient ID: Taylor Grimes, female    DOB: 1960-09-28, 52 y.o.   MRN: [id number]  HPI Here for f/u of chronic medical problems  On cipro for uti recent  cx pos - no sens yet  She feels a whole lot better - less frequent urination and no more dysuria - feels much much better  Is drinking her water Rested over the weekend   Wt is stable with bmi of 40  bp is stable today  No cp or palpitations or headaches or edema  No side effects to medicines  BP Readings from Last 3 Encounters:  05/09/13 118/76  05/06/13 112/68  02/01/13 110/72      Diabetes Home sugar results  DM diet - is trying really hard to be good with this -eats sweets infrequently , - avoids bread and pasta and potato (avoids biscuit) Exercise - very physical job - is on feet all day and too tired after a work day -- works at [employer]  A1C last  Lab Results  Component Value Date   HGBA1C 6.5 05/03/2013  up from 6.3  No problems with medications  Renal protection- on ARB Last eye exam - was may or June of this year  Flu vaccine - due for that - today  Hypothyroidism  Pt has no clinical changes No change in energy level/ hair or skin/ edema and no tremor Lab Results  Component Value Date   TSH 0.03* 05/03/2013    Need to decrease dose   Patient Active Problem List   Diagnosis Date Noted  . UTI (urinary tract infection) 05/06/2013  . URI (upper respiratory infection) 05/06/2013  . Dyspnea 02/01/2013  . Complex atypical endometrial hyperplasia 11/10/2012  . Post-menopausal bleeding 11/01/2012  . Colon cancer screening 11/01/2012  . Abdominal  pain, other specified site 09/28/2012  . Abnormal urine finding 09/28/2012  . DM type 2 (diabetes mellitus, type 2) 06/30/2012  . Varicose veins of leg with swelling, left 11/18/2011  . Obesity 11/12/2010  . HYPERTENSION 07/12/2009  . HYPOTHYROIDISM 05/16/2009  . TRANSAMINASES, SERUM, ELEVATED 05/16/2009   Past Medical History   Diagnosis Date  . UTI (urinary tract infection)   . Anxiety   . Breast lesion     benign  . Hypothyroid   . HTN (hypertension)   . Elevated transaminase level     ? fatty liver   . Diabetes mellitus without complication    Past Surgical History  Procedure Laterality Date  . Breast biopsy      benign cyst 2011  . Cesarean section    . Laparoscopic total hysterectomy  11/2012   History  Substance Use Topics  . Smoking status: Former Smoker    Quit date: 07/22/1979  . Smokeless tobacco: Not on file     Comment: briefly smoked at 16  . Alcohol Use: No   Family History  Problem Relation Age of Onset  . Hypothyroidism Mother   . Alzheimer's disease Mother   . Stroke Father   . Deep vein thrombosis Sister    No Known Allergies Current Outpatient Prescriptions on File Prior to Visit  Medication Sig Dispense Refill  . ciprofloxacin (CIPRO) 500 MG tablet Take 1 tablet (500 mg total) by mouth 2 (two) times daily.  14 tablet  0  . levothyroxine (SYNTHROID, LEVOTHROID) 200 MCG tablet Take 1 tablet (200 mcg total) by mouth daily.  30 tablet  2  . levothyroxine (SYNTHROID, LEVOTHROID) 50 MCG tablet Take  1 tablet (50 mcg total) by mouth daily. In addition to the 200 mcg dose taken daily  30 tablet  2  . losartan (COZAAR) 50 MG tablet Take 1 tablet (50 mg total) by mouth daily.  30 tablet  2  . metFORMIN (GLUCOPHAGE) 500 MG tablet Take 1 tablet (500 mg total) by mouth 2 (two) times daily with a meal.  60 tablet  5   No current facility-administered medications on file prior to visit.        Review of Systems Review of Systems  Constitutional: Negative for fever, appetite change, fatigue and unexpected weight change.  Eyes: Negative for pain and visual disturbance.  Respiratory: Negative for cough and shortness of breath.   Cardiovascular: Negative for cp or palpitations    Gastrointestinal: Negative for nausea, diarrhea and constipation.  Genitourinary: Negative for urgency and  frequency.  Skin: Negative for pallor or rash   Neurological: Negative for weakness, light-headedness, numbness and headaches.  Hematological: Negative for adenopathy. Does not bruise/bleed easily.  Psychiatric/Behavioral: Negative for dysphoric mood. The patient is not nervous/anxious.         Objective:   Physical Exam  Constitutional: She appears well-developed and well-nourished. No distress.  obese and well appearing   HENT:  Head: Normocephalic and atraumatic.  Mouth/Throat: Oropharynx is clear and moist.  Eyes: Conjunctivae and EOM are normal. Pupils are equal, round, and reactive to light. Right eye exhibits no discharge. Left eye exhibits no discharge. No scleral icterus.  Neck: Normal range of motion. Neck supple. No JVD present. Carotid bruit is not present. No thyromegaly present.  Cardiovascular: Normal rate, regular rhythm, normal heart sounds and intact distal pulses.  Exam reveals no gallop.   Pulmonary/Chest: Effort normal and breath sounds normal. No respiratory distress. She has no wheezes. She exhibits no tenderness.  Abdominal: Soft. Bowel sounds are normal. She exhibits no distension, no abdominal bruit and no mass. There is no tenderness.  No suprapubic tenderness or fullness    Musculoskeletal: She exhibits no edema.  Lymphadenopathy:    She has no cervical adenopathy.  Neurological: She is alert. She has normal reflexes. No cranial nerve deficit. She exhibits normal muscle tone. Coordination normal.  Skin: Skin is warm and dry. No rash noted. No erythema. No pallor.  Psychiatric: She has a normal mood and affect.          Assessment & Plan:

## 2013-05-09 NOTE — Assessment & Plan Note (Signed)
A1C is up to 6.5-still in very good control  No change in tx  Disc imp of continued wt loss effort and low glycemic diet  F/u 6 mo

## 2013-05-09 NOTE — Assessment & Plan Note (Signed)
Much imp with cipro Pending sensitivities on cx  inst to finish abx

## 2013-05-09 NOTE — Assessment & Plan Note (Signed)
bp in fair control at this time  No changes needed  Disc lifstyle change with low sodium diet and exercise  Labs reviewed  

## 2013-05-09 NOTE — Patient Instructions (Signed)
Decrease thyroid dose to 225 mcg total (that is one 200 mcg pill plus 1/2 50 mcg pill) once daily  Schedule non fasting labs in 1 month to re check thyroid  Follow up with me in 6 months with labs prior  Keep working on weight loss  Flu shot today I'm glad urine infection is getting better

## 2013-06-09 ENCOUNTER — Other Ambulatory Visit (INDEPENDENT_AMBULATORY_CARE_PROVIDER_SITE_OTHER): Payer: PRIVATE HEALTH INSURANCE

## 2013-06-09 DIAGNOSIS — E039 Hypothyroidism, unspecified: Secondary | ICD-10-CM

## 2013-06-09 LAB — TSH: TSH: 0.07 u[IU]/mL — ABNORMAL LOW (ref 0.35–5.50)

## 2013-06-13 ENCOUNTER — Other Ambulatory Visit: Payer: Self-pay | Admitting: Family Medicine

## 2013-06-20 ENCOUNTER — Ambulatory Visit (INDEPENDENT_AMBULATORY_CARE_PROVIDER_SITE_OTHER): Payer: No Typology Code available for payment source | Admitting: Family Medicine

## 2013-06-20 ENCOUNTER — Encounter: Payer: Self-pay | Admitting: Family Medicine

## 2013-06-20 VITALS — BP 113/79 | HR 81 | Ht 61.0 in | Wt 218.0 lb

## 2013-06-20 DIAGNOSIS — Z1272 Encounter for screening for malignant neoplasm of vagina: Secondary | ICD-10-CM

## 2013-06-20 DIAGNOSIS — Z1151 Encounter for screening for human papillomavirus (HPV): Secondary | ICD-10-CM

## 2013-06-20 DIAGNOSIS — C549 Malignant neoplasm of corpus uteri, unspecified: Secondary | ICD-10-CM

## 2013-06-20 NOTE — Assessment & Plan Note (Signed)
Normal surveillance of endometrial Ca with pap at vaginal cuff.  Discussed with NP at GYN/Onc. She will f/u with GYN/Onc at next visit.

## 2013-06-20 NOTE — Patient Instructions (Signed)
Uterine Cancer Uterine cancer is an abnormal growth of tissue (tumor) in the uterus that is cancerous (malignant). Unlike noncancerous (benign) tumors, malignant tumors can spread to other parts of your body. The wall of the uterus has two layers of tissue. The inner layer is the endometrium. The outer layer of muscle tissue is the myometrium. The most common type of uterine cancer begins in the endometrium. This is called endometrial cancer. Cancer that begins in the myometrium is called uterine sarcoma, which is very rare.  RISK FACTORS  Although the exact cause of uterine cancer is unknown, there are a number of risk factors that can increase your chances of getting uterine cancer. They include:  Your age. Uterine cancer occurs mostly in women older than 50 years.   Having an enlarged endometrium (endometrial hyperplasia).   Using hormone therapy.   Obesity.   Taking the drug tamoxifen.   White race.   Infertility.   Never being pregnant.   Beginning menstrual periods at an age younger than 12 years.   Having menstrual periods at an age older than 52 years.   Personal history of ovarian, intestinal, or colorectal cancer.   Having a family history of uterine cancer.   Having a family history of hereditary nonpolyposis colon cancer (HNPCC).   Having diabetes, high blood pressure, thyroid disease, or gallbladder disease.   Long-term use of high-dose birth control pills.   Exposure to radiation.   Smoking.  SIGNS AND SYMPTOMS   Abnormal vaginal bleeding or discharge. Bleeding may start as a watery, blood-streaked flow that gradually contains more blood.   Any vaginal bleeding after menopause.   Difficult or painful urination.   Pain during intercourse.   Pain in the pelvic area.  Mass in the vagina.  Pain or fullness in the abdomen.  Frequent urination.  Bleeding between periods.  Growth of the stomach.   Unexplained weight loss.   Uterine cancer usually occurs after menopause. However, it may also occur around the time that menopause begins. Abnormal vaginal bleeding is the most common symptom of uterine cancer. Women should not assume that abnormal vaginal bleeding is part of menopause. DIAGNOSIS  Your health care provider will ask about your medical history. He or she may also perform a number of procedures, such as:  A physical and pelvic exam. Your health care provider will feel your pelvis for any lumps.   Blood and urine tests.   X-rays.   Imaging tests, such as CT scans, ultrasonography, or MRIs.   A hysteroscopy to view the inside of your uterus.   A Pap test to sample cells from the cervix and upper vagina to check for abnormal cells.   Taking a tissue sample (biopsy) from the uterine lining to look for cancer cells.   A dilation and curettage (D&C). This involves stretching (dilation) the cervix and scraping (curettage) the inside lining of the uterus to get a tissue sample. The sample is examined under a microscope to look for cancer cells.  Your cancer will be staged to determine its severity and extent. Staging is a careful attempt to find out the size of the tumor, whether the cancer has spread, and if so, to what parts of the body. You may need to have more tests to determine the stage of your cancer. The test results will help determine what treatment plan is best for you. Cancer stages include:   Stage I The cancer is only found in the uterus.  Stage II The   cancer has spread to the cervix.  Stage III The cancer has spread outside the uterus, but not outside the pelvis. The cancer may have spread to the lymph nodes in the pelvis.  Stage IV The cancer has spread to other parts of the body, such as the bladder or rectum. TREATMENT  Most women with uterine cancer are treated with surgery. This includes removing the uterus, cervix, fallopian tubes, and ovaries (total hysterectomy). Your  lymph nodes near the tumor may also be removed. Some women have radiation, chemotherapy, or hormonal therapy. Other women have a combination of these therapies. HOME CARE INSTRUCTIONS   Only take over-the-counter or prescription medicines as directed by your health care provider.   Maintain a healthy diet.  Exercise regularly.   If you have diabetes, high blood pressure, thyroid disease, or gallbladder disease, follow your health care provider's instructions to keep it under control.   Do not smoke.   Consider joining a support group. This may help you learn to cope with the stress of having uterine cancer.   Seek advice to help you manage treatment side effects.   Keep all follow-up appointments as directed by your health care provider.  SEEK MEDICAL CARE IF:  You have increased stomach or pelvic pain.  You cannot urinate.  You have abnormal bleeding. Document Released: 07/07/2005 Document Revised: 03/09/2013 Document Reviewed: 12/24/2012 [employer] Patient Information 2014 [employer].  

## 2013-06-20 NOTE — Progress Notes (Signed)
   Subjective:    Patient ID: Taylor Grimes, female    DOB: January 22, 1961, 52 y.o.   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  Review of Systems  Constitutional: Positive for unexpected weight change (weight loss). Negative for fever and chills.  Respiratory: Negative for chest tightness and shortness of breath.   Cardiovascular: Negative for chest pain and leg swelling.  Gastrointestinal: Negative for abdominal pain.  Genitourinary: Negative for vaginal bleeding.  Skin: Negative for rash.       Objective:   Physical Exam  Vitals reviewed. Constitutional: She is oriented to person, place, and time. She appears well-developed and well-nourished.  HENT:  Head: Normocephalic and atraumatic.  Eyes: No scleral icterus.  Neck: Neck supple.  Cardiovascular: Normal rate and regular rhythm.   Pulmonary/Chest: Effort normal.  Genitourinary: Vagina normal.  No mass or lesion.  Normal appearing vaginal cuff.  Neurological: She is alert and oriented to person, place, and time.  Skin: Skin is warm. No rash noted.  Psychiatric: She has a normal mood and affect.          Assessment & Plan:

## 2013-07-05 ENCOUNTER — Other Ambulatory Visit: Payer: Self-pay | Admitting: Family Medicine

## 2013-07-05 DIAGNOSIS — E039 Hypothyroidism, unspecified: Secondary | ICD-10-CM

## 2013-07-11 ENCOUNTER — Other Ambulatory Visit (INDEPENDENT_AMBULATORY_CARE_PROVIDER_SITE_OTHER): Payer: No Typology Code available for payment source

## 2013-07-11 DIAGNOSIS — E039 Hypothyroidism, unspecified: Secondary | ICD-10-CM

## 2013-07-11 LAB — TSH: TSH: 0.04 u[IU]/mL — ABNORMAL LOW (ref 0.35–5.50)

## 2013-07-12 ENCOUNTER — Telehealth: Payer: Self-pay | Admitting: Family Medicine

## 2013-07-12 MED ORDER — LEVOTHYROXINE SODIUM 150 MCG PO TABS: 150.0000 ug | ORAL_TABLET | Freq: Every day | ORAL | Status: DC

## 2013-07-12 NOTE — Telephone Encounter (Signed)
Pt notified new dose of medication sent to pharmacy and to stop the dose. Lab appt scheduled for 08/03/12 to recheck pt's TSH

## 2013-07-12 NOTE — Telephone Encounter (Signed)
Message copied by Judy Pimple on Tue Jul 12, 2013  1:26 PM ------      Message from: Shon Millet      Created: Tue Jul 12, 2013 11:38 AM       Pt did stop taking the of synthroid as directed last month so pt is only taking 200 mcg of medication, I advise pt we would need to adjust medication again.      Pt also said she doesn't have any weight loss or anxiety but she has had some sleep issues, and jitteriness but she thinks the jitteriness is due to her sugar because she will eat something and feel better ------

## 2013-07-12 NOTE — Telephone Encounter (Signed)
I'm going to continue decreasing her dose until we get therapeutic - and hopefully sleep issues will improve  I sent the 150 mcg to [facility]  Stop 200- and take 150  Re check tsh please in 3-4 weeks for hypothyroidism, thanks

## 2013-07-22 ENCOUNTER — Other Ambulatory Visit: Payer: Self-pay | Admitting: Family Medicine

## 2013-07-22 DIAGNOSIS — E039 Hypothyroidism, unspecified: Secondary | ICD-10-CM

## 2013-08-02 ENCOUNTER — Other Ambulatory Visit (INDEPENDENT_AMBULATORY_CARE_PROVIDER_SITE_OTHER): Payer: No Typology Code available for payment source

## 2013-08-02 DIAGNOSIS — E039 Hypothyroidism, unspecified: Secondary | ICD-10-CM

## 2013-08-02 LAB — TSH: TSH: 0.06 u[IU]/mL — ABNORMAL LOW (ref 0.35–5.50)

## 2013-08-03 ENCOUNTER — Other Ambulatory Visit: Payer: No Typology Code available for payment source

## 2013-08-03 ENCOUNTER — Telehealth: Payer: Self-pay | Admitting: Family Medicine

## 2013-08-03 MED ORDER — LEVOTHYROXINE SODIUM 100 MCG PO TABS: 100.0000 ug | ORAL_TABLET | Freq: Every day | ORAL | Status: DC

## 2013-08-03 NOTE — Telephone Encounter (Signed)
Still very low tsh  Cut dose from 150 to 100 mcg -I will send to pharmacy  Check tsh / free T4/ T3 in 2 weeks please If not improving I may send her to an endocrinologist

## 2013-08-03 NOTE — Telephone Encounter (Signed)
Pt notified of thyroid levels and to get new Rx with lower dose. Lab appt scheduled for 08/15/13 and pt notified she may need to see endocrinologist if levels remain low

## 2013-08-08 ENCOUNTER — Other Ambulatory Visit: Payer: Self-pay | Admitting: Family Medicine

## 2013-08-08 DIAGNOSIS — E039 Hypothyroidism, unspecified: Secondary | ICD-10-CM

## 2013-08-15 ENCOUNTER — Encounter: Payer: Self-pay | Admitting: Family Medicine

## 2013-08-15 ENCOUNTER — Ambulatory Visit (INDEPENDENT_AMBULATORY_CARE_PROVIDER_SITE_OTHER): Payer: No Typology Code available for payment source | Admitting: Family Medicine

## 2013-08-15 VITALS — BP 126/82 | HR 95 | Temp 98.1°F | Wt 222.0 lb

## 2013-08-15 DIAGNOSIS — J209 Acute bronchitis, unspecified: Secondary | ICD-10-CM

## 2013-08-15 MED ORDER — GUAIFENESIN-CODEINE 100-10 MG/5ML PO SYRP: 5.0000 mL | ORAL_SOLUTION | Freq: Four times a day (QID) | ORAL | Status: DC | PRN

## 2013-08-15 MED ORDER — AZITHROMYCIN 250 MG PO TABS: ORAL_TABLET | ORAL | Status: DC

## 2013-08-15 NOTE — Assessment & Plan Note (Signed)
S/p uri Cover with zithromax Robitussin ac for cough with caution Disc symptomatic care - see instructions on AVS  Update if not starting to improve in a week or if worsening

## 2013-08-15 NOTE — Patient Instructions (Signed)
Drink lots of water and get rest Take zithromax as directed  Use cough medicine with caution- it has codeine and will sedate  Update if not starting to improve in a week or if worsening

## 2013-08-15 NOTE — Progress Notes (Signed)
Subjective:    Patient ID: Taylor Grimes, female    DOB: 13-Oct-1960, 53 y.o.   MRN: [id number]  HPI Here with uri symptoms - getting worse for a week  Bad cough- esp at night  rattly and not prod yet  Sinus pressure  Bad nasal congestion - yellow mucous drainage   Highest temp 99.9   mucinex DM is not controlling the cough  Patient Active Problem List   Diagnosis Date Noted  . UTI (urinary tract infection) 05/06/2013  . URI (upper respiratory infection) 05/06/2013  . Dyspnea 02/01/2013  . Malignant neoplasm of corpus uteri 12/15/2012  . Post-menopausal bleeding 11/01/2012  . Colon cancer screening 11/01/2012  . Abdominal  pain, other specified site 09/28/2012  . Abnormal urine finding 09/28/2012  . DM type 2 (diabetes mellitus, type 2) 06/30/2012  . Varicose veins of leg with swelling, left 11/18/2011  . Obesity 11/12/2010  . HYPERTENSION 07/12/2009  . HYPOTHYROIDISM 05/16/2009  . TRANSAMINASES, SERUM, ELEVATED 05/16/2009   Past Medical History  Diagnosis Date  . UTI (urinary tract infection)   . Anxiety   . Breast lesion     benign  . Hypothyroid   . HTN (hypertension)   . Elevated transaminase level     ? fatty liver   . Diabetes mellitus without complication    Past Surgical History  Procedure Laterality Date  . Breast biopsy      benign cyst 2011  . Cesarean section    . Laparoscopic total hysterectomy  11/2012   History  Substance Use Topics  . Smoking status: Former Smoker    Quit date: 07/22/1979  . Smokeless tobacco: Never Used     Comment: briefly smoked at 16  . Alcohol Use: No   Family History  Problem Relation Age of Onset  . Hypothyroidism Mother   . Alzheimer's disease Mother   . Stroke Father   . Deep vein thrombosis Sister    No Known Allergies Current Outpatient Prescriptions on File Prior to Visit  Medication Sig Dispense Refill  . levothyroxine (SYNTHROID, LEVOTHROID) 100 MCG tablet Take 1 tablet (100 mcg total) by mouth daily.   30 tablet  3  . losartan (COZAAR) 50 MG tablet Take 25 mg by mouth daily.      . metFORMIN (GLUCOPHAGE) 500 MG tablet Take 1 tablet (500 mg total) by mouth 2 (two) times daily with a meal.  60 tablet  5   No current facility-administered medications on file prior to visit.      Review of Systems Review of Systems  Constitutional: Negative for fever, appetite change,  and unexpected weight change. pos for fatigue ENT pos for congestion/ drip and ST Eyes: Negative for pain and visual disturbance.  Respiratory: Negative for  shortness of breath.   Cardiovascular: Negative for cp or palpitations    Gastrointestinal: Negative for nausea, diarrhea and constipation.  Genitourinary: Negative for urgency and frequency.  Skin: Negative for pallor or rash   Neurological: Negative for weakness, light-headedness, numbness and headaches.  Hematological: Negative for adenopathy. Does not bruise/bleed easily.  Psychiatric/Behavioral: Negative for dysphoric mood. The patient is not nervous/anxious.         Objective:   Physical Exam  Constitutional: She appears well-developed and well-nourished. No distress.  obese and well appearing   HENT:  Head: Normocephalic and atraumatic.  Right Ear: External ear normal.  Left Ear: External ear normal.  Mouth/Throat: Oropharynx is clear and moist. No oropharyngeal exudate.  Nares are injected  and congested   No sinus tenderness Tms dull  Eyes: Conjunctivae and EOM are normal. Pupils are equal, round, and reactive to light. Right eye exhibits no discharge. Left eye exhibits no discharge.  Neck: Normal range of motion. Neck supple.  Cardiovascular: Regular rhythm.   Pulmonary/Chest: Effort normal and breath sounds normal. No respiratory distress. She has no wheezes. She has no rales. She exhibits no tenderness.  Diffuse scattered rhonchi No wheeze No labored breathing   Lymphadenopathy:    She has no cervical adenopathy.  Neurological: She is alert.    Skin: Skin is dry. No rash noted.  Psychiatric: She has a normal mood and affect.          Assessment & Plan:

## 2013-08-18 ENCOUNTER — Other Ambulatory Visit (INDEPENDENT_AMBULATORY_CARE_PROVIDER_SITE_OTHER): Payer: No Typology Code available for payment source

## 2013-08-18 DIAGNOSIS — E039 Hypothyroidism, unspecified: Secondary | ICD-10-CM

## 2013-08-18 LAB — T3, FREE: T3, Free: 2.9 pg/mL (ref 2.3–4.2)

## 2013-08-18 LAB — T4, FREE: Free T4: 1.03 ng/dL (ref 0.60–1.60)

## 2013-08-18 LAB — TSH: TSH: 0.14 u[IU]/mL — ABNORMAL LOW (ref 0.35–5.50)

## 2013-08-19 ENCOUNTER — Telehealth: Payer: Self-pay | Admitting: Family Medicine

## 2013-08-19 DIAGNOSIS — E039 Hypothyroidism, unspecified: Secondary | ICD-10-CM

## 2013-08-19 NOTE — Telephone Encounter (Signed)
Low tsh with nl T4-ref to endo

## 2013-08-19 NOTE — Telephone Encounter (Signed)
Message copied by Abner Greenspan on Fri Aug 19, 2013  3:15 PM ------      Message from: Tammi Sou      Created: Fri Aug 19, 2013 12:51 PM       Pt notified of labs and Dr. Marliss Coots comments. Pt agrees with a referral to see endocrinologist, pt is okay with seeing Dr. Cruzita Lederer here at office, I advise pt that Marion/Linda will call her in the next few days to schedule an appt ------

## 2013-09-15 ENCOUNTER — Ambulatory Visit: Payer: No Typology Code available for payment source | Admitting: Internal Medicine

## 2013-09-29 ENCOUNTER — Encounter: Payer: Self-pay | Admitting: *Deleted

## 2013-09-29 ENCOUNTER — Ambulatory Visit (INDEPENDENT_AMBULATORY_CARE_PROVIDER_SITE_OTHER): Payer: No Typology Code available for payment source | Admitting: Internal Medicine

## 2013-09-29 ENCOUNTER — Encounter: Payer: Self-pay | Admitting: Internal Medicine

## 2013-09-29 VITALS — BP 130/68 | HR 73 | Temp 98.0°F | Resp 12 | Ht 62.0 in | Wt 226.0 lb

## 2013-09-29 DIAGNOSIS — E039 Hypothyroidism, unspecified: Secondary | ICD-10-CM

## 2013-09-29 LAB — TSH: TSH: 0.75 u[IU]/mL (ref 0.35–5.50)

## 2013-09-29 LAB — T4, FREE: Free T4: 1.14 ng/dL (ref 0.60–1.60)

## 2013-09-29 NOTE — Patient Instructions (Signed)
Please join My chart >> I will send you the results through there. If you do not have it active ehrn I get the results back >> we will call you with the results.  Please come back for a follow-up appointment in 6 months.

## 2013-09-29 NOTE — Progress Notes (Signed)
Patient ID: Taylor Grimes, female   DOB: [birth year], 53 y.o.   MRN: [id number]   HPI  Taylor Grimes is a 53 y.o.-year-old female, referred by her PCP, Dr. Glori Bickers, for evaluation for hypothyroidism.  Pt. has been dx with hypothyroidism in 2012; is on generic Levothyroxine 100 mcg (decreased from 125 mcg on 08/03/2013): - fasting - with water, sometimes with soda - separated by >30 min from b'fast  - no from calcium, iron, PPIs, multivitamins   I reviewed pt's thyroid tests: Lab Results  Component Value Date   TSH 0.14* 08/18/2013   TSH 0.06* 08/02/2013   TSH 0.04* 07/11/2013   TSH 0.07* 06/09/2013   TSH 0.03* 05/03/2013   TSH 0.94 06/23/2012   TSH 12.53* 07/01/2011   TSH 7.49* 05/06/2011   TSH 9.36* 04/08/2011   TSH 4.06 12/31/2010   FREET4 1.03 08/18/2013   FREET4 0.87 11/12/2010   FREET4 0.73 09/23/2010   FREET4 0.90 07/29/2010   FREET4 0.92 03/06/2010   FREET4 1.1 06/28/2009   FREET4 0.9 05/16/2009    Pt denies feeling nodules in neck, hoarseness, dysphagia/odynophagia, SOB with lying down.  Pt describes: - cold intolerance - + increased appetite - + weight gain: 211 lbs after her TAH+BSO in 11/2012, then gained back to 226 lbs - + fatigue - + constipation - no dry skin - no hair falling - no depression or anxiety, but had a panic attack x 1 when started work after her surgery - no palpitations - + tremor in left hand  She has + FH of thyroid disorders in: sister, mother - hypothyroidism. No FH of thyroid cancer.  No h/o radiation tx to head or neck. No recent use of iodine supplements, contrast subst. or steroids.  ROS: Constitutional: no weight gain/loss, no fatigue, no subjective hyperthermia/hypothermia Eyes: no blurry vision, no xerophthalmia ENT: no sore throat, + nodules palpated in throat, no dysphagia/odynophagia, no hoarseness Cardiovascular: no CP/SOB/palpitations/+ hand and leg swelling Respiratory: no cough/SOB Gastrointestinal: no N/V/D/C Musculoskeletal: no  muscle/joint aches Skin: no rashes Neurological: + left hand tremors/numbness/tingling/dizziness Psychiatric: no depression/anxiety  Past Medical History  Diagnosis Date  . UTI (urinary tract infection)   . Anxiety   . Breast lesion     benign  . Hypothyroid   . HTN (hypertension)   . Elevated transaminase level     ? fatty liver   . Diabetes mellitus without complication    Past Surgical History  Procedure Laterality Date  . Breast biopsy      benign cyst 2011  . Cesarean section    . Laparoscopic total hysterectomy  11/2012   History   Social History  . Marital Status: Legally Separated    Spouse Name: N/A    Number of Children: 2  . Years of Education: N/A   Occupational History  . shift manager  [employer]   Social History Main Topics  . Smoking status: Former Smoker    Quit date: 07/22/1979  . Smokeless tobacco: Never Used     Comment: briefly smoked at 16  . Alcohol Use: No  . Drug Use: No  . Sexual Activity: Yes    Birth Control/ Protection: Post-menopausal    Current Outpatient Prescriptions on File Prior to Visit  Medication Sig Dispense Refill  . azithromycin (ZITHROMAX Z-PAK) 250 MG tablet 2 pills by mouth today and then 1 pill each day for 4 days  6 each  0  . guaiFENesin-codeine (ROBITUSSIN AC) 100-10 MG/5ML syrup Take 5 mLs by mouth  4 (four) times daily as needed for cough.  120 mL  0  . levothyroxine (SYNTHROID, LEVOTHROID) 100 MCG tablet Take 1 tablet (100 mcg total) by mouth daily.  30 tablet  3  . losartan (COZAAR) 50 MG tablet Take 25 mg by mouth daily.      . metFORMIN (GLUCOPHAGE) 500 MG tablet Take 1 tablet (500 mg total) by mouth 2 (two) times daily with a meal.  60 tablet  5   No current facility-administered medications on file prior to visit.   No Known Allergies Family History  Problem Relation Age of Onset  . Hypothyroidism Mother   . Alzheimer's disease Mother   . Stroke Father   . Deep vein thrombosis Sister     PE: BP 130/68  Pulse 73  Temp(Src) 98 F (36.7 C) (Oral)  Resp 12  Ht 5\' 2"  (1.575 m)  Wt 226 lb (102.513 kg)  BMI 41.33 kg/m2  SpO2 96%  LMP 07/21/2008 Wt Readings from Last 3 Encounters:  09/29/13 226 lb (102.513 kg)  08/15/13 222 lb (100.699 kg)  06/20/13 218 lb (98.884 kg)   Constitutional: overweight, in NAD Eyes: PERRLA, EOMI, no exophthalmos ENT: moist mucous membranes, no thyromegaly, no cervical lymphadenopathy Cardiovascular: RRR, No MRG Respiratory: CTA B Gastrointestinal: abdomen soft, NT, ND, BS+ Musculoskeletal: no deformities, strength intact in all 4 Skin: moist, warm, no rashes Neurological: no tremor with outstretched hands, DTR normal in all 4  ASSESSMENT: 1. Hypothyroidism  PLAN:  1. Patient with a 3-year h/o hypothyroidism, on over-replacement with levothyroxine therapy. Her dose of Levothyroxine was last decreased 2 mo ago - We discussed about correct intake of levothyroxine, fasting, with water, separated by at least 30 minutes from breakfast, and separated by more than 4 hours from calcium, iron, multivitamins, acid reflux medications (PPIs). - She does not appear to have a goiter, thyroid nodules, or neck compression symptoms - we'll check thyroid tests today: TSH, free T4 - If these are abnormal, she will need to return in 6-8 weeks for repeat labs - If these are normal, I will see her back in 6 months  Office Visit on 09/29/2013  Component Date Value Ref Range Status  . TSH 09/29/2013 0.75  0.35 - 5.50 uIU/mL Final  . Free T4 09/29/2013 1.14  0.60 - 1.60 ng/dL Final   TFTs great >> continue current Levothyroxine dose.

## 2013-10-07 ENCOUNTER — Other Ambulatory Visit: Payer: Self-pay | Admitting: *Deleted

## 2013-10-07 MED ORDER — LOSARTAN POTASSIUM 50 MG PO TABS: 50.0000 mg | ORAL_TABLET | Freq: Every day | ORAL | Status: DC

## 2013-10-27 ENCOUNTER — Telehealth: Payer: Self-pay | Admitting: Family Medicine

## 2013-10-27 DIAGNOSIS — E119 Type 2 diabetes mellitus without complications: Secondary | ICD-10-CM

## 2013-10-27 DIAGNOSIS — I10 Essential (primary) hypertension: Secondary | ICD-10-CM

## 2013-10-27 DIAGNOSIS — E039 Hypothyroidism, unspecified: Secondary | ICD-10-CM

## 2013-10-27 NOTE — Telephone Encounter (Signed)
Message copied by Abner Greenspan on Thu Oct 27, 2013 10:16 PM ------      Message from: Ellamae Sia      Created: Tue Oct 18, 2013 12:39 PM      Regarding: Lab orders for Monday, 4.13.15       Lab orders for a 6 month f/u ------

## 2013-10-31 ENCOUNTER — Other Ambulatory Visit: Payer: PRIVATE HEALTH INSURANCE

## 2013-11-03 ENCOUNTER — Encounter: Payer: Self-pay | Admitting: Radiology

## 2013-11-03 ENCOUNTER — Other Ambulatory Visit (INDEPENDENT_AMBULATORY_CARE_PROVIDER_SITE_OTHER): Payer: No Typology Code available for payment source

## 2013-11-03 DIAGNOSIS — E119 Type 2 diabetes mellitus without complications: Secondary | ICD-10-CM

## 2013-11-03 DIAGNOSIS — I10 Essential (primary) hypertension: Secondary | ICD-10-CM

## 2013-11-03 DIAGNOSIS — E039 Hypothyroidism, unspecified: Secondary | ICD-10-CM

## 2013-11-03 LAB — COMPREHENSIVE METABOLIC PANEL
ALT: 37 U/L — ABNORMAL HIGH (ref 0–35)
AST: 44 U/L — ABNORMAL HIGH (ref 0–37)
Albumin: 3.3 g/dL — ABNORMAL LOW (ref 3.5–5.2)
Alkaline Phosphatase: 97 U/L (ref 39–117)
BUN: 13 mg/dL (ref 6–23)
CO2: 29 mEq/L (ref 19–32)
Calcium: 9.2 mg/dL (ref 8.4–10.5)
Chloride: 103 mEq/L (ref 96–112)
Creatinine, Ser: 0.9 mg/dL (ref 0.4–1.2)
GFR: 72.88 mL/min (ref >60.00)
Glucose, Bld: 91 mg/dL (ref 70–99)
Potassium: 4.2 mEq/L (ref 3.5–5.1)
Sodium: 138 mEq/L (ref 135–145)
Total Bilirubin: 1 mg/dL (ref 0.3–1.2)
Total Protein: 7.3 g/dL (ref 6.0–8.3)

## 2013-11-03 LAB — HEMOGLOBIN A1C: Hgb A1c MFr Bld: 6.5 % (ref 4.6–6.5)

## 2013-11-03 LAB — TSH: TSH: 9.07 u[IU]/mL — ABNORMAL HIGH (ref 0.35–5.50)

## 2013-11-07 ENCOUNTER — Ambulatory Visit: Payer: PRIVATE HEALTH INSURANCE | Admitting: Family Medicine

## 2013-11-09 ENCOUNTER — Telehealth: Payer: Self-pay | Admitting: Family Medicine

## 2013-11-09 ENCOUNTER — Ambulatory Visit (INDEPENDENT_AMBULATORY_CARE_PROVIDER_SITE_OTHER): Payer: No Typology Code available for payment source | Admitting: Family Medicine

## 2013-11-09 ENCOUNTER — Encounter: Payer: Self-pay | Admitting: Gastroenterology

## 2013-11-09 ENCOUNTER — Encounter: Payer: Self-pay | Admitting: Family Medicine

## 2013-11-09 VITALS — BP 118/64 | HR 83 | Temp 98.3°F | Ht 62.0 in | Wt 230.8 lb

## 2013-11-09 DIAGNOSIS — E039 Hypothyroidism, unspecified: Secondary | ICD-10-CM

## 2013-11-09 DIAGNOSIS — E669 Obesity, unspecified: Secondary | ICD-10-CM

## 2013-11-09 DIAGNOSIS — Z1211 Encounter for screening for malignant neoplasm of colon: Secondary | ICD-10-CM

## 2013-11-09 DIAGNOSIS — I10 Essential (primary) hypertension: Secondary | ICD-10-CM

## 2013-11-09 DIAGNOSIS — E119 Type 2 diabetes mellitus without complications: Secondary | ICD-10-CM

## 2013-11-09 NOTE — Telephone Encounter (Signed)
Relevant patient education assigned to patient using Emmi. ° °

## 2013-11-09 NOTE — Progress Notes (Signed)
Pre visit review using our clinic review tool, if applicable. No additional management support is needed unless otherwise documented below in the visit note. 

## 2013-11-09 NOTE — Progress Notes (Signed)
Subjective:    Patient ID: Taylor Grimes, female    DOB: 1961/07/05, 53 y.o.   MRN: [id number]  HPI Here for f/u of chronic medical problems   Wt is up 4 lb-obese Has cut out fried foods and sugar drinks  ? How many calories  Uses elliptical - every other day for 30 minutes  She would be interested in counting calories   In general feeling pretty good - most days More energetic and motivated-no depression    Fatty liver Lab Results  Component Value Date   ALT 37* 11/03/2013   AST 44* 11/03/2013   ALKPHOS 97 11/03/2013   BILITOT 1.0 11/03/2013   Up a bit   Hypothyroid-sees Dr Cruzita Lederer Lab Results  Component Value Date   TSH 9.07* 11/03/2013    bp is stable today  No cp or palpitations or headaches or edema  No side effects to medicines  BP Readings from Last 3 Encounters:  11/09/13 118/64  09/29/13 130/68  08/15/13 126/82     DM remains well controlled  Lab Results  Component Value Date   HGBA1C 6.5 11/03/2013     Patient Active Problem List   Diagnosis Date Noted  . Acute bronchitis 08/15/2013  . URI (upper respiratory infection) 05/06/2013  . Dyspnea 02/01/2013  . Malignant neoplasm of corpus uteri 12/15/2012  . Post-menopausal bleeding 11/01/2012  . Colon cancer screening 11/01/2012  . DM type 2 (diabetes mellitus, type 2) 06/30/2012  . Varicose veins of leg with swelling, left 11/18/2011  . Obesity 11/12/2010  . HYPERTENSION 07/12/2009  . HYPOTHYROIDISM 05/16/2009  . TRANSAMINASES, SERUM, ELEVATED 05/16/2009   Past Medical History  Diagnosis Date  . UTI (urinary tract infection)   . Anxiety   . Breast lesion     benign  . Hypothyroid   . HTN (hypertension)   . Elevated transaminase level     ? fatty liver   . Diabetes mellitus without complication    Past Surgical History  Procedure Laterality Date  . Breast biopsy      benign cyst 2011  . Cesarean section    . Laparoscopic total hysterectomy  11/2012   History  Substance Use Topics  .  Smoking status: Former Smoker    Quit date: 07/22/1979  . Smokeless tobacco: Never Used     Comment: briefly smoked at 16  . Alcohol Use: No   Family History  Problem Relation Age of Onset  . Hypothyroidism Mother   . Alzheimer's disease Mother   . Stroke Father   . Deep vein thrombosis Sister    No Known Allergies Current Outpatient Prescriptions on File Prior to Visit  Medication Sig Dispense Refill  . levothyroxine (SYNTHROID, LEVOTHROID) 100 MCG tablet Take 1 tablet (100 mcg total) by mouth daily.  30 tablet  3  . losartan (COZAAR) 50 MG tablet Take 1 tablet (50 mg total) by mouth daily.  30 tablet  3  . metFORMIN (GLUCOPHAGE) 500 MG tablet Take 1 tablet (500 mg total) by mouth 2 (two) times daily with a meal.  60 tablet  5   No current facility-administered medications on file prior to visit.    Review of Systems Review of Systems  Constitutional: Negative for fever, appetite change, fatigue and unexpected weight change.  Eyes: Negative for pain and visual disturbance.  Respiratory: Negative for cough and shortness of breath.   Cardiovascular: Negative for cp or palpitations    Gastrointestinal: Negative for nausea, diarrhea and constipation.  Genitourinary:  Negative for urgency and frequency.  Skin: Negative for pallor or rash   Neurological: Negative for weakness, light-headedness, numbness and headaches.  Hematological: Negative for adenopathy. Does not bruise/bleed easily.  Psychiatric/Behavioral: Negative for dysphoric mood. The patient is not nervous/anxious.         Objective:   Physical Exam  Constitutional: She appears well-developed and well-nourished. No distress.  HENT:  Head: Normocephalic and atraumatic.  Mouth/Throat: Oropharynx is clear and moist.  Eyes: Conjunctivae and EOM are normal. Pupils are equal, round, and reactive to light. No scleral icterus.  Neck: Normal range of motion. Neck supple. No JVD present. Carotid bruit is not present. No  thyromegaly present.  Cardiovascular: Normal rate, regular rhythm, normal heart sounds and intact distal pulses.  Exam reveals no gallop.   Varicosities on legs  Pulmonary/Chest: Effort normal and breath sounds normal. No respiratory distress. She has no wheezes. She has no rales.  Abdominal: Soft. Bowel sounds are normal. She exhibits no abdominal bruit.  Musculoskeletal: She exhibits no edema.  Lymphadenopathy:    She has no cervical adenopathy.  Neurological: She is alert. She has normal reflexes. No cranial nerve deficit. She exhibits normal muscle tone. Coordination normal.  Skin: Skin is warm and dry. No rash noted. No erythema. No pallor.  Psychiatric: She has a normal mood and affect.          Assessment & Plan:

## 2013-11-09 NOTE — Patient Instructions (Signed)
For weight loss- keep up the good work with exercise It may help to count calories/ watch intake Check out the free app called "myfitnesspal"- it is a good way to track your progress  Stop up front to let the staff know about your colonoscopy referral  Take care of yourself  Follow up with me for an annual exam in about 6 months with labs prior

## 2013-11-10 NOTE — Assessment & Plan Note (Signed)
Lab Results  Component Value Date   HGBA1C 6.5 11/03/2013   This remains very well controlled

## 2013-11-10 NOTE — Assessment & Plan Note (Signed)
bp in fair control at this time  BP Readings from Last 1 Encounters:  11/09/13 118/64   No changes needed Disc lifstyle change with low sodium diet and exercise  Lab rev

## 2013-11-10 NOTE — Assessment & Plan Note (Signed)
Discussed how this problem influences overall health and the risks it imposes  Reviewed plan for weight loss with lower calorie diet (via better food choices and also portion control or program like weight watchers) and exercise building up to or more than 30 minutes 5 days per week including some aerobic activity   Long disc about use of accountability- suggested a calorie counting app or wt watchers

## 2013-11-10 NOTE — Assessment & Plan Note (Signed)
Hypothyroidism  Pt has no clinical changes No change in energy level/ hair or skin/ edema and no tremor Lab Results  Component Value Date   TSH 9.07* 11/03/2013    Will relay this to her endocrinologist

## 2013-11-10 NOTE — Assessment & Plan Note (Signed)
Pt ready for her ref for colonoscopy now

## 2013-11-11 ENCOUNTER — Telehealth: Payer: Self-pay | Admitting: *Deleted

## 2013-11-11 NOTE — Telephone Encounter (Signed)
Called pt and advised her that her TSH was a 9. Asked pt if she is taking her medication consistently and correctly. Pt states she takes her med in the morning on an empty stomach and every day. Advised pt that she needs to be sure to take her med daily and to return to lab [facility]) in 6 weeks. Pt understood. Be advised.

## 2013-11-18 ENCOUNTER — Telehealth: Payer: Self-pay

## 2013-11-18 NOTE — Telephone Encounter (Signed)
Relevant patient education assigned to patient using Emmi. ° °

## 2013-12-05 ENCOUNTER — Ambulatory Visit (INDEPENDENT_AMBULATORY_CARE_PROVIDER_SITE_OTHER): Payer: No Typology Code available for payment source | Admitting: Family Medicine

## 2013-12-05 ENCOUNTER — Encounter: Payer: Self-pay | Admitting: Family Medicine

## 2013-12-05 VITALS — BP 116/72 | HR 95 | Temp 98.9°F | Ht 62.0 in | Wt 231.5 lb

## 2013-12-05 DIAGNOSIS — N390 Urinary tract infection, site not specified: Secondary | ICD-10-CM

## 2013-12-05 DIAGNOSIS — R300 Dysuria: Secondary | ICD-10-CM

## 2013-12-05 LAB — POCT URINALYSIS DIPSTICK
Glucose, UA: NEGATIVE
Nitrite, UA: POSITIVE
Spec Grav, UA: >=1.03
Urobilinogen, UA: 1
pH, UA: 6

## 2013-12-05 LAB — POCT UA - MICROSCOPIC ONLY

## 2013-12-05 MED ORDER — CIPROFLOXACIN HCL 250 MG PO TABS: 250.0000 mg | ORAL_TABLET | Freq: Two times a day (BID) | ORAL | Status: DC

## 2013-12-05 NOTE — Progress Notes (Signed)
Pre visit review using our clinic review tool, if applicable. No additional management support is needed unless otherwise documented below in the visit note. 

## 2013-12-05 NOTE — Assessment & Plan Note (Signed)
Complicated by fever and malaise but no flank pain  Cover with cipro  Enc water  cx sent  Update if not starting to improve in a week or if worsening

## 2013-12-05 NOTE — Progress Notes (Signed)
Subjective:    Patient ID: Taylor Grimes, female    DOB: January 02, 1961, 53 y.o.   MRN: [id number]  HPI Here for uti symptoms and fever    Started yesterday  Had a fever of 101 and nauseated (ibuprofen helped)  Burns to urinate Had to urinate every hour through the night  Looked like there was a bit of blood in it  Back hurts -lower only   She has had infections in the past   Patient Active Problem List   Diagnosis Date Noted  . Malignant neoplasm of corpus uteri 12/15/2012  . Post-menopausal bleeding 11/01/2012  . Colon cancer screening 11/01/2012  . DM type 2 (diabetes mellitus, type 2) 06/30/2012  . Varicose veins of leg with swelling, left 11/18/2011  . Obesity 11/12/2010  . HYPERTENSION 07/12/2009  . HYPOTHYROIDISM 05/16/2009  . TRANSAMINASES, SERUM, ELEVATED 05/16/2009   Past Medical History  Diagnosis Date  . UTI (urinary tract infection)   . Anxiety   . Breast lesion     benign  . Hypothyroid   . HTN (hypertension)   . Elevated transaminase level     ? fatty liver   . Diabetes mellitus without complication    Past Surgical History  Procedure Laterality Date  . Breast biopsy      benign cyst 2011  . Cesarean section    . Laparoscopic total hysterectomy  11/2012   History  Substance Use Topics  . Smoking status: Former Smoker    Quit date: 07/22/1979  . Smokeless tobacco: Never Used     Comment: briefly smoked at 16  . Alcohol Use: No   Family History  Problem Relation Age of Onset  . Hypothyroidism Mother   . Alzheimer's disease Mother   . Stroke Father   . Deep vein thrombosis Sister    No Known Allergies Current Outpatient Prescriptions on File Prior to Visit  Medication Sig Dispense Refill  . levothyroxine (SYNTHROID, LEVOTHROID) 100 MCG tablet Take 1 tablet (100 mcg total) by mouth daily.  30 tablet  3  . losartan (COZAAR) 50 MG tablet Take 1 tablet (50 mg total) by mouth daily.  30 tablet  3  . metFORMIN (GLUCOPHAGE) 500 MG tablet Take 1  tablet (500 mg total) by mouth 2 (two) times daily with a meal.  60 tablet  5   No current facility-administered medications on file prior to visit.      Review of Systems Review of Systems  Constitutional: pos for fever and malaise .  Eyes: Negative for pain and visual disturbance.  Respiratory: Negative for cough and shortness of breath.   Cardiovascular: Negative for cp or palpitations    Gastrointestinal: Negative for nausea, diarrhea and constipation.  Genitourinary: pos  for urgency and frequency. pos for dysuria and bladder pain  Skin: Negative for pallor or rash   Neurological: Negative for weakness, light-headedness, numbness and headaches.  Hematological: Negative for adenopathy. Does not bruise/bleed easily.  Psychiatric/Behavioral: Negative for dysphoric mood. The patient is not nervous/anxious.         Objective:   Physical Exam  Constitutional: She appears well-developed and well-nourished. No distress.  obese and well appearing   HENT:  Head: Normocephalic and atraumatic.  Mouth/Throat: Oropharynx is clear and moist.  Eyes: Conjunctivae and EOM are normal. Pupils are equal, round, and reactive to light. No scleral icterus.  Neck: Normal range of motion. Neck supple.  Cardiovascular: Normal rate and regular rhythm.   Pulmonary/Chest: Effort normal and breath sounds  normal. She has no rales.  Abdominal: Soft. Bowel sounds are normal. She exhibits no distension and no mass. There is tenderness. There is no rebound and no guarding.  Mild suprapubic tenderness No cva tenderness   Lymphadenopathy:    She has no cervical adenopathy.  Neurological: She is alert.  Skin: Skin is warm and dry. No rash noted.  Psychiatric: She has a normal mood and affect.          Assessment & Plan:

## 2013-12-05 NOTE — Patient Instructions (Signed)
Drink lots of water  Take cipro twice daily with food for uti - start it now  We will culture your urine and get back to you with a result  Update if not starting to improve in a week or if worsening

## 2013-12-06 ENCOUNTER — Encounter: Payer: Self-pay | Admitting: Family Medicine

## 2013-12-07 LAB — URINE CULTURE: Colony Count: >=100000

## 2013-12-16 ENCOUNTER — Ambulatory Visit (AMBULATORY_SURGERY_CENTER): Payer: Self-pay | Admitting: *Deleted

## 2013-12-16 VITALS — Ht 61.5 in | Wt 235.8 lb

## 2013-12-16 DIAGNOSIS — Z1211 Encounter for screening for malignant neoplasm of colon: Secondary | ICD-10-CM

## 2013-12-16 MED ORDER — MOVIPREP 100 G PO SOLR: ORAL | Status: DC

## 2013-12-16 NOTE — Progress Notes (Signed)
Patient denies any allergies to eggs or soy. Patient denies any problems with anesthesia/sedation. Patient denies any oxygen use at home and does not take any diet/weight loss medications. EMMI education assisgned to patient on colonoscopy, this was explained and instructions given to patient. 

## 2013-12-26 ENCOUNTER — Other Ambulatory Visit: Payer: Self-pay | Admitting: *Deleted

## 2013-12-26 MED ORDER — METFORMIN HCL 500 MG PO TABS: 500.0000 mg | ORAL_TABLET | Freq: Two times a day (BID) | ORAL | Status: DC

## 2013-12-26 MED ORDER — LEVOTHYROXINE SODIUM 100 MCG PO TABS: 100.0000 ug | ORAL_TABLET | Freq: Every day | ORAL | Status: DC

## 2013-12-26 NOTE — Telephone Encounter (Signed)
Rout to Dr. Cruzita Lederer since she is managing her thyroid issues

## 2013-12-27 ENCOUNTER — Other Ambulatory Visit: Payer: Self-pay | Admitting: *Deleted

## 2013-12-28 ENCOUNTER — Telehealth: Payer: Self-pay | Admitting: Family Medicine

## 2013-12-28 DIAGNOSIS — E039 Hypothyroidism, unspecified: Secondary | ICD-10-CM

## 2013-12-28 NOTE — Telephone Encounter (Signed)
Message copied by Abner Greenspan on Wed Dec 28, 2013  7:11 AM ------      Message from: Ellamae Sia      Created: Mon Dec 26, 2013 10:39 AM      Regarding: Lab orders for Wednesday, 6.10.15       Lab orders, no f/u ------

## 2013-12-28 NOTE — Telephone Encounter (Signed)
I think lab is for endocrinology- Dr Cruzita Lederer, I think she needs a tsh- I will order that and forward this to her also

## 2013-12-29 ENCOUNTER — Other Ambulatory Visit (INDEPENDENT_AMBULATORY_CARE_PROVIDER_SITE_OTHER): Payer: No Typology Code available for payment source

## 2013-12-29 DIAGNOSIS — E039 Hypothyroidism, unspecified: Secondary | ICD-10-CM

## 2013-12-29 LAB — TSH: TSH: 7.25 u[IU]/mL — ABNORMAL HIGH (ref 0.35–4.50)

## 2013-12-30 ENCOUNTER — Other Ambulatory Visit: Payer: Self-pay | Admitting: Internal Medicine

## 2013-12-30 ENCOUNTER — Encounter: Payer: Self-pay | Admitting: Gastroenterology

## 2013-12-30 ENCOUNTER — Ambulatory Visit (AMBULATORY_SURGERY_CENTER): Payer: No Typology Code available for payment source | Admitting: Gastroenterology

## 2013-12-30 VITALS — BP 116/69 | HR 61 | Temp 97.7°F | Resp 14 | Ht 61.5 in | Wt 235.0 lb

## 2013-12-30 DIAGNOSIS — E039 Hypothyroidism, unspecified: Secondary | ICD-10-CM

## 2013-12-30 DIAGNOSIS — K5730 Diverticulosis of large intestine without perforation or abscess without bleeding: Secondary | ICD-10-CM

## 2013-12-30 DIAGNOSIS — Z1211 Encounter for screening for malignant neoplasm of colon: Secondary | ICD-10-CM

## 2013-12-30 DIAGNOSIS — D126 Benign neoplasm of colon, unspecified: Secondary | ICD-10-CM

## 2013-12-30 LAB — GLUCOSE, CAPILLARY
Glucose-Capillary: 109 mg/dL — ABNORMAL HIGH (ref 70–99)
Glucose-Capillary: 90 mg/dL (ref 70–99)

## 2013-12-30 MED ORDER — SODIUM CHLORIDE 0.9 % IV SOLN: 500.0000 mL | INTRAVENOUS | Status: DC

## 2013-12-30 MED ORDER — LEVOTHYROXINE SODIUM 112 MCG PO TABS: 112.0000 ug | ORAL_TABLET | Freq: Every day | ORAL | Status: DC

## 2013-12-30 NOTE — Progress Notes (Signed)
Report to PACU, RN, vss, BBS= Clear.  

## 2013-12-30 NOTE — Progress Notes (Signed)
Called to room to assist during endoscopic procedure.  Patient ID and intended procedure confirmed with present staff. Received instructions for my participation in the procedure from the performing physician.  

## 2013-12-30 NOTE — Patient Instructions (Signed)

## 2013-12-30 NOTE — Op Note (Signed)
[facility]  [address]. [place], [address]   COLONOSCOPY PROCEDURE REPORT  PATIENT: Taylor, Grimes  MR#: [id number] BIRTHDATE: 04-Sep-1960 , 89  yrs. old GENDER: Female ENDOSCOPIST: Milus Banister, MD REFERRED TD:[facility]BU Vernell Morgans, M.D. PROCEDURE DATE:  12/30/2013 PROCEDURE:   Colonoscopy with snare polypectomy First Screening Colonoscopy - Avg.  risk and is 50 yrs.  old or older Yes.  Prior Negative Screening - Now for repeat screening. N/A  History of Adenoma - Now for follow-up colonoscopy & has been > or = to 3 yrs.  N/A  Polyps Removed Today? Yes. ASA CLASS:   Class II INDICATIONS:average risk screening. MEDICATIONS: MAC sedation, administered by CRNA and Propofol (Diprivan) 290 mg IV  DESCRIPTION OF PROCEDURE:   After the risks benefits and alternatives of the procedure were thoroughly explained, informed consent was obtained.  A digital rectal exam revealed no abnormalities of the rectum.   The [id number]  endoscope was introduced through the anus and advanced to the cecum, which was identified by both the appendix and ileocecal valve. No adverse events experienced.   The quality of the prep was excellent.  The instrument was then slowly withdrawn as the colon was fully examined.  COLON FINDINGS: Three polyps were found, removed and sent to pathology.  One was sessile, 10mm across, located at hepatic flexure, removed with snare/cautery.  One was located in rectum, sessile, close to anal verge, 18mm across, removed with snare/cautery.  The last was sessile, 71mm across, located in rectum, removed with cold snare.  There were diverticulum throughout the colon.  The examination was otherwise normal. Retroflexed views revealed no abnormalities. The time to cecum=1 minutes 58 seconds.  Withdrawal time=15 minutes 37 seconds.  The scope was withdrawn and the procedure completed. COMPLICATIONS: There were no complications.  ENDOSCOPIC IMPRESSION: Three  polyps were found, removed and sent to pathology.There were diverticulum throughout the colon.  The examination was otherwise normal.  RECOMMENDATIONS: If the polyp(s) removed today are proven to be adenomatous (pre-cancerous) polyps, you will need a colonoscopy in 3-5 years. Otherwise you should continue to follow colorectal cancer screening guidelines for "routine risk" patients with a colonoscopy in 10 years.  You will receive a letter within 1-2 weeks with the results of your biopsy as well as final recommendations.  Please call my office if you have not received a letter after 3 weeks.   eSigned:  Milus Banister, MD 12/30/2013 8:52 AM

## 2014-01-02 ENCOUNTER — Telehealth: Payer: Self-pay | Admitting: *Deleted

## 2014-01-02 NOTE — Telephone Encounter (Signed)
  Follow up Call-  Call back number 12/30/2013  Post procedure Call Back phone  # work [phone]  Permission to leave phone message No     Patient questions:  Do you have a fever, pain , or abdominal swelling? no Pain Score  0 *  Have you tolerated food without any problems? yes  Have you been able to return to your normal activities? yes  Do you have any questions about your discharge instructions: Diet   no Medications  no Follow up visit  no  Do you have questions or concerns about your Care? no  Actions: * If pain score is 4 or above: No action needed, pain <4.

## 2014-01-06 ENCOUNTER — Encounter: Payer: Self-pay | Admitting: Gastroenterology

## 2014-03-08 ENCOUNTER — Telehealth: Payer: Self-pay | Admitting: Family Medicine

## 2014-03-08 DIAGNOSIS — Z0000 Encounter for general adult medical examination without abnormal findings: Secondary | ICD-10-CM | POA: Insufficient documentation

## 2014-03-08 DIAGNOSIS — E119 Type 2 diabetes mellitus without complications: Secondary | ICD-10-CM

## 2014-03-08 NOTE — Telephone Encounter (Signed)
Message copied by Abner Greenspan on Wed Mar 08, 2014  6:40 PM ------      Message from: Ellamae Sia      Created: Tue Mar 07, 2014 12:24 PM      Regarding: Lab orders for Thursday, 8.20.15       Lab orders ------

## 2014-03-09 ENCOUNTER — Other Ambulatory Visit (INDEPENDENT_AMBULATORY_CARE_PROVIDER_SITE_OTHER): Payer: No Typology Code available for payment source

## 2014-03-09 ENCOUNTER — Encounter (INDEPENDENT_AMBULATORY_CARE_PROVIDER_SITE_OTHER): Payer: Self-pay

## 2014-03-09 DIAGNOSIS — E119 Type 2 diabetes mellitus without complications: Secondary | ICD-10-CM

## 2014-03-09 DIAGNOSIS — R7401 Elevation of levels of liver transaminase levels: Secondary | ICD-10-CM

## 2014-03-09 DIAGNOSIS — R740 Nonspecific elevation of levels of transaminase and lactic acid dehydrogenase [LDH]: Secondary | ICD-10-CM

## 2014-03-09 DIAGNOSIS — Z0000 Encounter for general adult medical examination without abnormal findings: Secondary | ICD-10-CM

## 2014-03-09 DIAGNOSIS — I10 Essential (primary) hypertension: Secondary | ICD-10-CM

## 2014-03-09 DIAGNOSIS — E039 Hypothyroidism, unspecified: Secondary | ICD-10-CM

## 2014-03-09 DIAGNOSIS — R7402 Elevation of levels of lactic acid dehydrogenase (LDH): Secondary | ICD-10-CM

## 2014-03-09 DIAGNOSIS — E1165 Type 2 diabetes mellitus with hyperglycemia: Secondary | ICD-10-CM

## 2014-03-09 LAB — LIPID PANEL
Cholesterol: 167 mg/dL (ref 0–200)
HDL: 33.1 mg/dL — ABNORMAL LOW (ref >39.00)
LDL Cholesterol: 104 mg/dL — ABNORMAL HIGH (ref 0–99)
NonHDL: 133.9
Total CHOL/HDL Ratio: 5
Triglycerides: 148 mg/dL (ref 0.0–149.0)
VLDL: 29.6 mg/dL (ref 0.0–40.0)

## 2014-03-09 LAB — T4, FREE: Free T4: 1.05 ng/dL (ref 0.60–1.60)

## 2014-03-09 LAB — CBC WITH DIFFERENTIAL/PLATELET
Basophils Absolute: 0.1 10*3/uL (ref 0.0–0.1)
Basophils Relative: 1 % (ref 0.0–3.0)
Eosinophils Absolute: 0.4 10*3/uL (ref 0.0–0.7)
Eosinophils Relative: 5.1 % — ABNORMAL HIGH (ref 0.0–5.0)
HCT: 36.8 % (ref 36.0–46.0)
Hemoglobin: 12 g/dL (ref 12.0–15.0)
Lymphocytes Relative: 38.6 % (ref 12.0–46.0)
Lymphs Abs: 2.7 10*3/uL (ref 0.7–4.0)
MCHC: 32.7 g/dL (ref 30.0–36.0)
MCV: 90.6 fl (ref 78.0–100.0)
Monocytes Absolute: 0.5 10*3/uL (ref 0.1–1.0)
Monocytes Relative: 6.7 % (ref 3.0–12.0)
Neutro Abs: 3.4 10*3/uL (ref 1.4–7.7)
Neutrophils Relative %: 48.6 % (ref 43.0–77.0)
Platelets: 194 10*3/uL (ref 150.0–400.0)
RBC: 4.06 Mil/uL (ref 3.87–5.11)
RDW: 14.2 % (ref 11.5–15.5)
WBC: 7 10*3/uL (ref 4.0–10.5)

## 2014-03-09 LAB — COMPREHENSIVE METABOLIC PANEL
ALT: 29 U/L (ref 0–35)
AST: 41 U/L — ABNORMAL HIGH (ref 0–37)
Albumin: 3.1 g/dL — ABNORMAL LOW (ref 3.5–5.2)
Alkaline Phosphatase: 93 U/L (ref 39–117)
BUN: 13 mg/dL (ref 6–23)
CO2: 29 mEq/L (ref 19–32)
Calcium: 9.1 mg/dL (ref 8.4–10.5)
Chloride: 101 mEq/L (ref 96–112)
Creatinine, Ser: 0.8 mg/dL (ref 0.4–1.2)
GFR: 77.99 mL/min (ref >60.00)
Glucose, Bld: 201 mg/dL — ABNORMAL HIGH (ref 70–99)
Potassium: 4 mEq/L (ref 3.5–5.1)
Sodium: 137 mEq/L (ref 135–145)
Total Bilirubin: 0.8 mg/dL (ref 0.2–1.2)
Total Protein: 7.3 g/dL (ref 6.0–8.3)

## 2014-03-09 LAB — HEMOGLOBIN A1C: Hgb A1c MFr Bld: 7.7 % — ABNORMAL HIGH (ref 4.6–6.5)

## 2014-03-09 LAB — TSH: TSH: 4.6 u[IU]/mL — ABNORMAL HIGH (ref 0.35–4.50)

## 2014-03-16 ENCOUNTER — Other Ambulatory Visit: Payer: Self-pay | Admitting: *Deleted

## 2014-03-16 ENCOUNTER — Ambulatory Visit (INDEPENDENT_AMBULATORY_CARE_PROVIDER_SITE_OTHER): Payer: No Typology Code available for payment source | Admitting: Internal Medicine

## 2014-03-16 ENCOUNTER — Encounter: Payer: Self-pay | Admitting: Internal Medicine

## 2014-03-16 VITALS — BP 136/98 | HR 67 | Temp 98.2°F | Wt 232.8 lb

## 2014-03-16 DIAGNOSIS — J309 Allergic rhinitis, unspecified: Secondary | ICD-10-CM

## 2014-03-16 MED ORDER — LOSARTAN POTASSIUM 50 MG PO TABS: 50.0000 mg | ORAL_TABLET | Freq: Every day | ORAL | Status: DC

## 2014-03-16 MED ORDER — FLUTICASONE PROPIONATE 50 MCG/ACT NA SUSP: 2.0000 | Freq: Every day | NASAL | Status: DC

## 2014-03-16 NOTE — Progress Notes (Signed)
Pre visit review using our clinic review tool, if applicable. No additional management support is needed unless otherwise documented below in the visit note. 

## 2014-03-16 NOTE — Progress Notes (Signed)
HPI  Pt presents to the clinic today with c/o headache, nasal congestion, sore throat and chest congestion. This started yesterday. She is coughing up white mucous. She denies fever, chills or body aches. She has tried Ibuprofen without any relief. She has no history of allergies or breathing problems. She has had contacts with patients with similar symptoms. She does not smoke.  Review of Systems    Past Medical History  Diagnosis Date  . UTI (urinary tract infection)   . Anxiety   . Breast lesion     benign  . Hypothyroid   . HTN (hypertension)   . Elevated transaminase level     ? fatty liver   . Diabetes mellitus without complication     Family History  Problem Relation Age of Onset  . Hypothyroidism Mother   . Alzheimer's disease Mother   . Stroke Father   . Deep vein thrombosis Sister   . Colon cancer Neg Hx   . Esophageal cancer Neg Hx   . Stomach cancer Neg Hx   . Rectal cancer Neg Hx     History   Social History  . Marital Status: Legally Separated    Spouse Name: N/A    Number of Children: 2  . Years of Education: N/A   Occupational History  . shift manager  [employer]   Social History Main Topics  . Smoking status: Former Smoker    Quit date: 07/22/1979  . Smokeless tobacco: Never Used     Comment: briefly smoked at 16  . Alcohol Use: No     Comment: rarely  . Drug Use: No  . Sexual Activity: Yes    Birth Control/ Protection: Post-menopausal   Other Topics Concern  . Not on file   Social History Narrative  . No narrative on file    No Known Allergies   Constitutional: Positive headache. Denies fatigue, fever or abrupt weight changes.  HEENT:  Positive nasal congestion and sore throat. Denies eye redness, ear pain, ringing in the ears, wax buildup, runny nose or bloody nose. Respiratory: Positive cough. Denies difficulty breathing or shortness of breath.  Cardiovascular: Denies chest pain, chest tightness, palpitations or swelling  in the hands or feet.   No other specific complaints in a complete review of systems (except as listed in HPI above).  Objective:   BP 136/98  Pulse 67  Temp(Src) 98.2 F (36.8 C) (Oral)  Wt 232 lb 12 oz (105.575 kg)  SpO2 98%  LMP 07/21/2008  General: Appears her stated age, obese but well developed, well nourished in NAD. HEENT: Head: normal shape and size, no sinus tenderness noted; Ears: cerumen impaction bilaterally; Nose: mucosa pink and moist, septum midline; Throat/Mouth: + PND. Teeth present, mucosa pink and moist, no exudate noted, no lesions or ulcerations noted.  Cardiovascular: Normal rate and rhythm. S1,S2 noted.  No murmur, rubs or gallops noted. No JVD or BLE edema. No carotid bruits noted. Pulmonary/Chest: Normal effort and positive vesicular breath sounds. No respiratory distress. No wheezes, rales or ronchi noted.      Assessment & Plan:   Allergic Rhinitis  Flonase 2 sprays each nostril for 3 days and then as needed. Start Allegra OTC Watch for fever, facial pain and pressure, or colored mucous from you nasal passages  RTC as needed or if symptoms persist.

## 2014-03-16 NOTE — Patient Instructions (Addendum)

## 2014-03-22 LAB — HM DIABETES EYE EXAM

## 2014-03-30 ENCOUNTER — Ambulatory Visit (INDEPENDENT_AMBULATORY_CARE_PROVIDER_SITE_OTHER): Payer: No Typology Code available for payment source | Admitting: Internal Medicine

## 2014-03-30 ENCOUNTER — Encounter: Payer: Self-pay | Admitting: Internal Medicine

## 2014-03-30 VITALS — BP 118/68 | HR 72 | Temp 97.8°F | Resp 12 | Wt 233.0 lb

## 2014-03-30 DIAGNOSIS — E039 Hypothyroidism, unspecified: Secondary | ICD-10-CM

## 2014-03-30 NOTE — Patient Instructions (Signed)
Please return in 6 months. Please have thyroid labs drawn on 05/05/2024.

## 2014-03-30 NOTE — Progress Notes (Signed)
Patient ID: Taylor Grimes, female   DOB: 10-22-1960, 53 y.o.   MRN: [id number]   HPI  Taylor Grimes is a 53 y.o.-year-old female, returning for f/u for hypothyroidism.  Pt. has been dx with hypothyroidism in 2012; is on generic Levothyroxine 112 mcg: - fasting - with water, sometimes with soda - separated by 1-1.5 hours min from b'fast  - no from calcium, iron, PPIs, multivitamins   I reviewed pt's thyroid tests: Lab Results  Component Value Date   TSH 4.60* 03/09/2014   TSH 7.25* 12/29/2013   TSH 9.07* 11/03/2013   TSH 0.75 09/29/2013   TSH 0.14* 08/18/2013   TSH 0.06* 08/02/2013   TSH 0.04* 07/11/2013   TSH 0.07* 06/09/2013   TSH 0.03* 05/03/2013   TSH 0.94 06/23/2012   FREET4 1.05 03/09/2014   FREET4 1.14 09/29/2013   FREET4 1.03 08/18/2013   FREET4 0.87 11/12/2010   FREET4 0.73 09/23/2010   FREET4 0.90 07/29/2010   FREET4 0.92 03/06/2010   FREET4 1.1 06/28/2009   FREET4 0.9 05/16/2009    Pt denies feeling nodules in neck, hoarseness, dysphagia/odynophagia, SOB with lying down.  Pt describes: - cold intolerance - + weight gain: 211 lbs after her TAH+BSO in 11/2012, then gained back to 226 lbs at last visit and 233 lbs today - + fatigue: she tells me she has good days and bad days -  resoved constipation - no dry skin - no hair falling - no depression or anxiety, but had a panic attack x 1 when started work after her surgery - no palpitations - + tremor in left hand - not always  She wakes up at 3 am to go to work and open [employer] at 5 am.   I reviewed pt's medications, allergies, PMH, social hx, family hx and no changes required, except as mentioned above.  ROS: Constitutional: no weight gain/loss, no fatigue, no subjective hyperthermia/hypothermia Eyes: no blurry vision, no xerophthalmia ENT: no sore throat, no nodules palpated in throat, no dysphagia/odynophagia, no hoarseness Cardiovascular: no CP/SOB/palpitations/+ hand and leg swelling Respiratory: no  cough/SOB Gastrointestinal: no N/V/D/C Musculoskeletal: no muscle/joint aches Skin: no rashes Neurological: + left hand tremors/numbness/tingling/dizziness  PE: BP 118/68  Pulse 72  Temp(Src) 97.8 F (36.6 C) (Oral)  Resp 12  Wt 233 lb (105.688 kg)  SpO2 96%  LMP 07/21/2008 Wt Readings from Last 3 Encounters:  03/30/14 233 lb (105.688 kg)  03/16/14 232 lb 12 oz (105.575 kg)  12/30/13 235 lb (106.595 kg)   Constitutional: overweight, in NAD Eyes: PERRLA, EOMI, no exophthalmos ENT: moist mucous membranes, no thyromegaly, no cervical lymphadenopathy Cardiovascular: RRR, No MRG Respiratory: CTA B Gastrointestinal: abdomen soft, NT, ND, BS+ Musculoskeletal: no deformities, strength intact in all 4 Skin: moist, warm, no rashes Neurological:DTR normal in all 4  ASSESSMENT: 1. Hypothyroidism  PLAN:  1. Patient with a 3-year h/o hypothyroidism, previously over-replaced with levothyroxine therapy. Her dose of Levothyroxine was decreased and her TFTs improved at last check 3 weeks ago. Her TSH was at the ULN then and we kept the LT4 dose the same, at 112 mcg daily.  - We again discussed about correct intake of levothyroxine, fasting, with water, separated by at least 30 minutes from breakfast, and separated by more than 4 hours from calcium, iron, multivitamins, acid reflux medications (PPIs). She is taking it correctly. - She does not appear to have a goiter, thyroid nodules, or neck compression symptoms - we'll check thyroid tests at her next blood draw (05/05/2014):  TSH, free T4. If TSH still above the ULN, will increase LT4 dose. Orders Placed This Encounter  Procedures  . TSH  . T4, free  - If these are abnormal, she will need to return in 6-8 weeks for repeat labs - If these are normal, I will see her back in 6 months

## 2014-04-13 ENCOUNTER — Other Ambulatory Visit: Payer: Self-pay | Admitting: Family Medicine

## 2014-04-13 DIAGNOSIS — Z1231 Encounter for screening mammogram for malignant neoplasm of breast: Secondary | ICD-10-CM

## 2014-05-04 ENCOUNTER — Other Ambulatory Visit: Payer: Self-pay | Admitting: Internal Medicine

## 2014-05-04 ENCOUNTER — Other Ambulatory Visit (INDEPENDENT_AMBULATORY_CARE_PROVIDER_SITE_OTHER): Payer: No Typology Code available for payment source

## 2014-05-04 ENCOUNTER — Ambulatory Visit
Admission: RE | Admit: 2014-05-04 | Discharge: 2014-05-04 | Disposition: A | Discharge status: Home / Self Care | Payer: No Typology Code available for payment source | Source: Ambulatory Visit | Attending: Family Medicine | Admitting: Family Medicine

## 2014-05-04 DIAGNOSIS — E039 Hypothyroidism, unspecified: Secondary | ICD-10-CM

## 2014-05-04 DIAGNOSIS — Z1231 Encounter for screening mammogram for malignant neoplasm of breast: Secondary | ICD-10-CM

## 2014-05-04 DIAGNOSIS — E038 Other specified hypothyroidism: Secondary | ICD-10-CM

## 2014-05-04 LAB — T4, FREE: Free T4: 1.14 ng/dL (ref 0.60–1.60)

## 2014-05-04 LAB — TSH: TSH: 4.9 u[IU]/mL — ABNORMAL HIGH (ref 0.35–4.50)

## 2014-05-04 MED ORDER — LEVOTHYROXINE SODIUM 125 MCG PO TABS: 125.0000 ug | ORAL_TABLET | Freq: Every day | ORAL | Status: DC

## 2014-05-05 ENCOUNTER — Other Ambulatory Visit: Payer: No Typology Code available for payment source

## 2014-05-12 ENCOUNTER — Encounter: Payer: No Typology Code available for payment source | Admitting: Family Medicine

## 2014-05-22 ENCOUNTER — Encounter: Payer: Self-pay | Admitting: Internal Medicine

## 2014-05-25 ENCOUNTER — Other Ambulatory Visit: Payer: Self-pay | Admitting: *Deleted

## 2014-05-25 MED ORDER — LOSARTAN POTASSIUM 50 MG PO TABS: 50.0000 mg | ORAL_TABLET | Freq: Every day | ORAL | Status: DC

## 2014-06-30 ENCOUNTER — Ambulatory Visit: Payer: No Typology Code available for payment source

## 2014-06-30 ENCOUNTER — Ambulatory Visit (INDEPENDENT_AMBULATORY_CARE_PROVIDER_SITE_OTHER): Payer: No Typology Code available for payment source | Admitting: *Deleted

## 2014-06-30 DIAGNOSIS — Z23 Encounter for immunization: Secondary | ICD-10-CM

## 2014-08-10 ENCOUNTER — Other Ambulatory Visit: Payer: Self-pay | Admitting: *Deleted

## 2014-08-10 MED ORDER — METFORMIN HCL 500 MG PO TABS: 500.0000 mg | ORAL_TABLET | Freq: Two times a day (BID) | ORAL | Status: DC

## 2014-08-22 LAB — HM DIABETES EYE EXAM

## 2014-09-28 ENCOUNTER — Encounter: Payer: Self-pay | Admitting: Internal Medicine

## 2014-09-28 ENCOUNTER — Ambulatory Visit (INDEPENDENT_AMBULATORY_CARE_PROVIDER_SITE_OTHER): Payer: No Typology Code available for payment source | Admitting: Internal Medicine

## 2014-09-28 VITALS — BP 118/70 | HR 64 | Temp 97.6°F | Resp 12 | Wt 229.0 lb

## 2014-09-28 DIAGNOSIS — E039 Hypothyroidism, unspecified: Secondary | ICD-10-CM

## 2014-09-28 LAB — TSH: TSH: 1.68 u[IU]/mL (ref 0.35–4.50)

## 2014-09-28 LAB — T4, FREE: Free T4: 1.6 ng/dL (ref 0.60–1.60)

## 2014-09-28 MED ORDER — LEVOTHYROXINE SODIUM 125 MCG PO TABS: 125.0000 ug | ORAL_TABLET | Freq: Every day | ORAL | Status: DC

## 2014-09-28 NOTE — Progress Notes (Signed)
Patient ID: Taylor Grimes, female   DOB: Oct 08, 1960, 54 y.o.   MRN: [id number]   HPI  Taylor Grimes is a 54 y.o.-year-old female, returning for f/u for hypothyroidism. Last visit 6 mo ago.   Pt. has been dx with hypothyroidism in 2012; is on generic Levothyroxine 112 mcg >> 125 mcg (increased 04/2014): - fasting - with water - separated by 1-1.5 hours min from b'fast  - no calcium, iron, PPIs, multivitamins   I reviewed pt's thyroid tests: She did not return for labs after we increased the LT4 dose.  Lab Results  Component Value Date   TSH 4.90* 05/04/2014   TSH 4.60* 03/09/2014   TSH 7.25* 12/29/2013   TSH 9.07* 11/03/2013   TSH 0.75 09/29/2013   TSH 0.14* 08/18/2013   TSH 0.06* 08/02/2013   TSH 0.04* 07/11/2013   TSH 0.07* 06/09/2013   TSH 0.03* 05/03/2013   FREET4 1.14 05/04/2014   FREET4 1.05 03/09/2014   FREET4 1.14 09/29/2013   FREET4 1.03 08/18/2013   FREET4 0.87 11/12/2010   FREET4 0.73 09/23/2010   FREET4 0.90 07/29/2010   FREET4 0.92 03/06/2010   FREET4 1.1 06/28/2009   FREET4 0.9 05/16/2009    Pt denies feeling nodules in neck, hoarseness, dysphagia/odynophagia, SOB with lying down. She does have a recent cough.  Pt feels well - no complaints: - no heat/cold intolerance - no weight gain, even weight loss - watching her diet - no fatigue - no constipation - no dry skin - no hair falling - no depression or anxiety, but had a panic attack x 1 when started work after her surgery - no palpitations - + tremor in left hand (hereditary)  She still wakes up at 3 am to go to work and open [employer] at 5 am.   I reviewed pt's medications, allergies, PMH, social hx, family hx, and changes were documented in the history of present illness. Otherwise, unchanged from my initial visit note.  ROS: Constitutional: no weight gain/loss, no fatigue, no subjective hyperthermia/hypothermia Eyes: no blurry vision, no xerophthalmia ENT: no sore throat, no nodules palpated in  throat, no dysphagia/odynophagia, no hoarseness Cardiovascular: no CP/SOB/palpitations/+ hand and leg swelling Respiratory: no cough/SOB Gastrointestinal: no N/V/D/C Musculoskeletal: no muscle/joint aches Skin: no rashes Neurological: + left hand tremors/numbness/tingling/dizziness  PE: BP 118/70 mmHg  Pulse 64  Temp(Src) 97.6 F (36.4 C) (Oral)  Resp 12  Wt 229 lb (103.874 kg)  SpO2 97%  LMP 07/21/2008 Wt Readings from Last 3 Encounters:  09/28/14 229 lb (103.874 kg)  03/30/14 233 lb (105.688 kg)  03/16/14 232 lb 12 oz (105.575 kg)   Constitutional: overweight, in NAD Eyes: PERRLA, EOMI, no exophthalmos ENT: moist mucous membranes, no thyromegaly, no cervical lymphadenopathy Cardiovascular: RRR, No MRG Respiratory: CTA B Gastrointestinal: abdomen soft, NT, ND, BS+ Musculoskeletal: no deformities, strength intact in all 4 Skin: moist, warm, no rashes Neurological:DTR normal in all 4  ASSESSMENT: 1. Hypothyroidism  PLAN:  1. Patient with h/o hypothyroidism, previously over-replaced with levothyroxine therapy. Her dose of Levothyroxine was decreased and her TFTs improved but remained above the ULN. At last visit, we increased the LT4 dose to 125 mcg (6 mo ago) but she did not return for labs in 6 weeks to check her dose.  - Will check the TFTs today.  - needs a refill of LT4 when labs are back - We again discussed about correct intake of levothyroxine, fasting, with water, separated by at least 30 minutes from breakfast, and separated  by more than 4 hours from calcium, iron, multivitamins, acid reflux medications (PPIs). She is taking it correctly. - She does not appear to have a goiter, thyroid nodules, or neck compression symptoms - I will see her back in 1 year.  Office Visit on 09/28/2014  Component Date Value Ref Range Status  . TSH 09/28/2014 1.68  0.35 - 4.50 uIU/mL Final  . Free T4 09/28/2014 1.60  0.60 - 1.60 ng/dL Final    Thyroid tests are normal. Continue the  same dose of 125 g levothyroxine daily.

## 2014-09-28 NOTE — Patient Instructions (Signed)
Please stop at the lab.  Continue Levothyroxine 125 mcg. Take the thyroid hormone every day, with water, >30 minutes before breakfast, separated by >4 hours from acid reflux medications, calcium, iron, multivitamins.  Please come back for a follow-up appointment in 1 year.

## 2014-10-08 ENCOUNTER — Telehealth: Payer: Self-pay | Admitting: Family Medicine

## 2014-10-08 DIAGNOSIS — Z0000 Encounter for general adult medical examination without abnormal findings: Secondary | ICD-10-CM

## 2014-10-08 DIAGNOSIS — E119 Type 2 diabetes mellitus without complications: Secondary | ICD-10-CM

## 2014-10-08 NOTE — Telephone Encounter (Signed)
-----   Message from Ellamae Sia sent at 10/06/2014 12:57 PM EDT ----- Regarding: Lab orders for Thursday, 3.24.16 Patient is scheduled for CPX labs, please order future labs, Thanks , Karna Christmas

## 2014-10-12 ENCOUNTER — Other Ambulatory Visit (INDEPENDENT_AMBULATORY_CARE_PROVIDER_SITE_OTHER): Payer: BLUE CROSS/BLUE SHIELD

## 2014-10-12 DIAGNOSIS — Z0000 Encounter for general adult medical examination without abnormal findings: Secondary | ICD-10-CM | POA: Diagnosis not present

## 2014-10-12 DIAGNOSIS — E119 Type 2 diabetes mellitus without complications: Secondary | ICD-10-CM | POA: Diagnosis not present

## 2014-10-12 LAB — CBC WITH DIFFERENTIAL/PLATELET
Basophils Absolute: 0 K/uL (ref 0.0–0.1)
Basophils Relative: 0.6 % (ref 0.0–3.0)
Eosinophils Absolute: 0.3 K/uL (ref 0.0–0.7)
Eosinophils Relative: 3.8 % (ref 0.0–5.0)
HCT: 38.6 % (ref 36.0–46.0)
Hemoglobin: 13 g/dL (ref 12.0–15.0)
Lymphocytes Relative: 34.8 % (ref 12.0–46.0)
Lymphs Abs: 2.6 K/uL (ref 0.7–4.0)
MCHC: 33.8 g/dL (ref 30.0–36.0)
MCV: 87.5 fl (ref 78.0–100.0)
Monocytes Absolute: 0.6 K/uL (ref 0.1–1.0)
Monocytes Relative: 8.1 % (ref 3.0–12.0)
Neutro Abs: 4 K/uL (ref 1.4–7.7)
Neutrophils Relative %: 52.7 % (ref 43.0–77.0)
Platelets: 194 K/uL (ref 150.0–400.0)
RBC: 4.41 Mil/uL (ref 3.87–5.11)
RDW: 13.5 % (ref 11.5–15.5)
WBC: 7.5 K/uL (ref 4.0–10.5)

## 2014-10-12 LAB — COMPREHENSIVE METABOLIC PANEL
ALT: 31 U/L (ref 0–35)
AST: 32 U/L (ref 0–37)
Albumin: 3.5 g/dL (ref 3.5–5.2)
Alkaline Phosphatase: 113 U/L (ref 39–117)
BUN: 12 mg/dL (ref 6–23)
CO2: 31 mEq/L (ref 19–32)
Calcium: 9.5 mg/dL (ref 8.4–10.5)
Chloride: 104 mEq/L (ref 96–112)
Creatinine, Ser: 0.85 mg/dL (ref 0.40–1.20)
GFR: 73.61 mL/min (ref >60.00)
Glucose, Bld: 155 mg/dL — ABNORMAL HIGH (ref 70–99)
Potassium: 4 mEq/L (ref 3.5–5.1)
Sodium: 139 mEq/L (ref 135–145)
Total Bilirubin: 0.9 mg/dL (ref 0.2–1.2)
Total Protein: 7.8 g/dL (ref 6.0–8.3)

## 2014-10-12 LAB — LIPID PANEL
Cholesterol: 158 mg/dL (ref 0–200)
HDL: 33.7 mg/dL — ABNORMAL LOW (ref >39.00)
LDL Cholesterol: 99 mg/dL (ref 0–99)
NonHDL: 124.3
Total CHOL/HDL Ratio: 5
Triglycerides: 126 mg/dL (ref 0.0–149.0)
VLDL: 25.2 mg/dL (ref 0.0–40.0)

## 2014-10-12 LAB — HEMOGLOBIN A1C: Hgb A1c MFr Bld: 8.1 % — ABNORMAL HIGH (ref 4.6–6.5)

## 2014-10-13 ENCOUNTER — Other Ambulatory Visit: Payer: No Typology Code available for payment source

## 2014-10-20 ENCOUNTER — Encounter: Payer: No Typology Code available for payment source | Admitting: Family Medicine

## 2014-10-24 ENCOUNTER — Ambulatory Visit (INDEPENDENT_AMBULATORY_CARE_PROVIDER_SITE_OTHER): Payer: BLUE CROSS/BLUE SHIELD | Admitting: Family Medicine

## 2014-10-24 ENCOUNTER — Encounter: Payer: Self-pay | Admitting: Family Medicine

## 2014-10-24 VITALS — BP 132/78 | HR 81 | Temp 97.9°F | Ht 61.25 in | Wt 230.8 lb

## 2014-10-24 DIAGNOSIS — I10 Essential (primary) hypertension: Secondary | ICD-10-CM | POA: Diagnosis not present

## 2014-10-24 DIAGNOSIS — E669 Obesity, unspecified: Secondary | ICD-10-CM

## 2014-10-24 DIAGNOSIS — E786 Lipoprotein deficiency: Secondary | ICD-10-CM | POA: Insufficient documentation

## 2014-10-24 DIAGNOSIS — Z1211 Encounter for screening for malignant neoplasm of colon: Secondary | ICD-10-CM

## 2014-10-24 DIAGNOSIS — E119 Type 2 diabetes mellitus without complications: Secondary | ICD-10-CM

## 2014-10-24 DIAGNOSIS — Z0000 Encounter for general adult medical examination without abnormal findings: Secondary | ICD-10-CM

## 2014-10-24 DIAGNOSIS — E039 Hypothyroidism, unspecified: Secondary | ICD-10-CM | POA: Diagnosis not present

## 2014-10-24 MED ORDER — METFORMIN HCL 1000 MG PO TABS: 1000.0000 mg | ORAL_TABLET | Freq: Two times a day (BID) | ORAL | Status: DC

## 2014-10-24 MED ORDER — LOSARTAN POTASSIUM 50 MG PO TABS: 50.0000 mg | ORAL_TABLET | Freq: Every day | ORAL | Status: DC

## 2014-10-24 NOTE — Progress Notes (Signed)
Subjective:    Patient ID: Taylor Grimes, female    DOB: Apr 09, 1961, 54 y.o.   MRN: [id number]  HPI Here for health maintenance exam and to review chronic medical problems   Is feeling good  Trying to take card of herself   Wt is up 1 lb with bmi of 32 Obese Hard to loose wt working at [employer] / HIV screen- low risk, declines   Flu shot 12/15  Mammogram 10/15 nl  Self exam-no breast lumps   Hysterectomy for endom ca - has upcoming follow up due  No gyn problems   Td 10/08  Pneumovax 4/14  colonosc 6/15 polyps - glad to get that over with - 5 year recall   bp is stable today  No cp or palpitations or headaches or edema  No side effects to medicines  BP Readings from Last 3 Encounters:  10/24/14 132/78  09/28/14 118/70  03/30/14 118/68     Diabetes Home sugar results - they are usually 100-110 , does not check later in the day  DM diet -not eating differently , in fact not eating enough in general - tries to watch carbs / once in while has sweets  Exercise -on her feet at work-no extra exercise (thinks she may be able to get back to the gym)  Symptoms at night - she gets up to urinate more  A1C last  Lab Results  Component Value Date   HGBA1C 8.1* 10/12/2014    No problems with medications  Renal protection on arb  Last eye exam  2 mo ago   Hypothyroidism  Pt has no clinical changes No change in energy level/ hair or skin/ edema and no tremor Lab Results  Component Value Date   TSH 1.68 09/28/2014    She sees Dr Cruzita Lederer for this    Chemistry      Component Value Date/Time   NA 139 10/12/2014 0809   K 4.0 10/12/2014 0809   CL 104 10/12/2014 0809   CO2 31 10/12/2014 0809   BUN 12 10/12/2014 0809   CREATININE 0.85 10/12/2014 0809      Component Value Date/Time   CALCIUM 9.5 10/12/2014 0809   ALKPHOS 113 10/12/2014 0809   AST 32 10/12/2014 0809   ALT 31 10/12/2014 0809   BILITOT 0.9 10/12/2014 0809      Lab Results  Component Value  Date   WBC 7.5 10/12/2014   HGB 13.0 10/12/2014   HCT 38.6 10/12/2014   MCV 87.5 10/12/2014   PLT 194.0 10/12/2014    Cholesterol Lab Results  Component Value Date   CHOL 158 10/12/2014   CHOL 167 03/09/2014   CHOL 143 05/03/2013   Lab Results  Component Value Date   HDL 33.70* 10/12/2014   HDL 33.10* 03/09/2014   HDL 30.80* 05/03/2013   Lab Results  Component Value Date   LDLCALC 99 10/12/2014   LDLCALC 104* 03/09/2014   LDLCALC 94 05/03/2013   Lab Results  Component Value Date   TRIG 126.0 10/12/2014   TRIG 148.0 03/09/2014   TRIG 90.0 05/03/2013   Lab Results  Component Value Date   CHOLHDL 5 10/12/2014   CHOLHDL 5 03/09/2014   CHOLHDL 5 05/03/2013   No results found for: LDLDIRECT  Needs to start exercise   Patient Active Problem List   Diagnosis Date Noted  . Routine general medical examination at a health care facility 03/08/2014  . UTI (urinary tract infection)  12/05/2013  . Malignant neoplasm of corpus uteri 12/15/2012  . Post-menopausal bleeding 11/01/2012  . Colon cancer screening 11/01/2012  . DM type 2 (diabetes mellitus, type 2) 06/30/2012  . Varicose veins of leg with swelling, left 11/18/2011  . Obesity 11/12/2010  . Essential hypertension 07/12/2009  . Hypothyroidism 05/16/2009  . TRANSAMINASES, SERUM, ELEVATED 05/16/2009   Past Medical History  Diagnosis Date  . UTI (urinary tract infection)   . Anxiety   . Breast lesion     benign  . Hypothyroid   . HTN (hypertension)   . Elevated transaminase level     ? fatty liver   . Diabetes mellitus without complication    Past Surgical History  Procedure Laterality Date  . Breast biopsy      benign cyst 2011  . Cesarean section    . Laparoscopic total hysterectomy  11/2012   History  Substance Use Topics  . Smoking status: Former Smoker    Quit date: 07/22/1979  . Smokeless tobacco: Never Used     Comment: briefly smoked at 16  . Alcohol Use: No     Comment: rarely   Family  History  Problem Relation Age of Onset  . Hypothyroidism Mother   . Alzheimer's disease Mother   . Stroke Father   . Deep vein thrombosis Sister   . Colon cancer Neg Hx   . Esophageal cancer Neg Hx   . Stomach cancer Neg Hx   . Rectal cancer Neg Hx    No Known Allergies Current Outpatient Prescriptions on File Prior to Visit  Medication Sig Dispense Refill  . fluticasone (FLONASE) 50 MCG/ACT nasal spray Place 2 sprays into both nostrils daily. 16 g 6  . levothyroxine (SYNTHROID, LEVOTHROID) 125 MCG tablet Take 1 tablet (125 mcg total) by mouth daily. 90 tablet 3  . losartan (COZAAR) 50 MG tablet Take 1 tablet (50 mg total) by mouth daily. 30 tablet 5  . metFORMIN (GLUCOPHAGE) 500 MG tablet Take 1 tablet (500 mg total) by mouth 2 (two) times daily with a meal. 60 tablet 5   No current facility-administered medications on file prior to visit.         Review of Systems Review of Systems  Constitutional: Negative for fever, appetite change, fatigue and unexpected weight change.  Eyes: Negative for pain and visual disturbance.  Respiratory: Negative for cough and shortness of breath.   Cardiovascular: Negative for cp or palpitations    Gastrointestinal: Negative for nausea, diarrhea and constipation.  Genitourinary: Negative for urgency and frequency.  Skin: Negative for pallor or rash   Neurological: Negative for weakness, light-headedness, numbness and headaches.  Hematological: Negative for adenopathy. Does not bruise/bleed easily.  Psychiatric/Behavioral: Negative for dysphoric mood. The patient is not nervous/anxious.         Objective:   Physical Exam  Constitutional: She appears well-developed and well-nourished. No distress.  obese and well appearing   HENT:  Head: Normocephalic and atraumatic.  Right Ear: External ear normal.  Left Ear: External ear normal.  Mouth/Throat: Oropharynx is clear and moist.  Nares are boggy  Eyes: Conjunctivae and EOM are normal.  Pupils are equal, round, and reactive to light. No scleral icterus.  Neck: Normal range of motion. Neck supple. No JVD present. Carotid bruit is not present. No thyromegaly present.  Cardiovascular: Normal rate, regular rhythm, normal heart sounds and intact distal pulses.  Exam reveals no gallop.   Pulmonary/Chest: Effort normal and breath sounds normal. No respiratory distress. She  has no wheezes. She exhibits no tenderness.  Abdominal: Soft. Bowel sounds are normal. She exhibits no distension, no abdominal bruit and no mass. There is no tenderness.  Genitourinary:      Musculoskeletal: Normal range of motion. She exhibits no edema or tenderness.  Lymphadenopathy:    She has no cervical adenopathy.  Neurological: She is alert. She has normal reflexes. No cranial nerve deficit. She exhibits normal muscle tone. Coordination normal.  Skin: Skin is warm and dry. No rash noted. No erythema. No pallor.  Many skin tags and lentigo  Psychiatric: She has a normal mood and affect.          Assessment & Plan:   Problem List Items Addressed This Visit      Cardiovascular and Mediastinum   Essential hypertension    bp in fair control at this time  BP Readings from Last 1 Encounters:  10/24/14 132/78   No changes needed Disc lifstyle change with low sodium diet and exercise        Relevant Medications   losartan (COZAAR) tablet     Endocrine   DM type 2 (diabetes mellitus, type 2)    Lab Results  Component Value Date   HGBA1C 8.1* 10/12/2014   Out of control  Also obese  Rev low glycemic diet - will ref to DM teaching  Inc metformin to 1000 bid -will update if side eff utd opth exam  Nl foot exam  Disc imp of wt loss         Relevant Medications   metFORMIN (GLUCOPHAGE) tablet   losartan (COZAAR) tablet   Other Relevant Orders   Ambulatory referral to diabetic education   Hypothyroidism    Lab Results  Component Value Date   TSH 1.68 09/28/2014   Sees  endocrinology Doing well         Other   Colon cancer screening    Colonoscopy is utd from 6/15 with 5 year recall for polyps       Low HDL (under 40)    Disc imp of exercise to achieve HDL over 40  Disc goals for lipids and reasons to control them Rev labs with pt Rev low sat fat diet in detail       Obesity    Discussed how this problem influences overall health and the risks it imposes  Reviewed plan for weight loss with lower calorie diet (via better food choices and also portion control or program like weight watchers) and exercise building up to or more than 30 minutes 5 days per week including some aerobic activity         Relevant Medications   metFORMIN (GLUCOPHAGE) tablet   Routine general medical examination at a health care facility - Primary    Reviewed health habits including diet and exercise and skin cancer prevention Reviewed appropriate screening tests for age  Also reviewed health mt list, fam hx and immunization status , as well as social and family history   Disc imp of exercise to inc HDL cholesterol  Labs reviewed

## 2014-10-24 NOTE — Assessment & Plan Note (Signed)
Reviewed health habits including diet and exercise and skin cancer prevention Reviewed appropriate screening tests for age  Also reviewed health mt list, fam hx and immunization status , as well as social and family history   Disc imp of exercise to inc HDL cholesterol  Labs reviewed

## 2014-10-24 NOTE — Assessment & Plan Note (Signed)
Lab Results  Component Value Date   TSH 1.68 09/28/2014   Sees endocrinology Doing well

## 2014-10-24 NOTE — Patient Instructions (Signed)
Try to get back to the gym and /or outside walking - work up to 30 minutes 5 days per week if you can  Work on weight loss  Increase metformin to 1000 mg twice daily  Check blood sugar am fasting and 2 hours after later meal - keep a log of it  If any side effects from metformin let me know  Stop up front for referral to diabetic teaching  Exercise will help increase your HDL cholesterol   Follow up with me in about 3 months with labs prior for diabetes

## 2014-10-24 NOTE — Assessment & Plan Note (Signed)
bp in fair control at this time  BP Readings from Last 1 Encounters:  10/24/14 132/78   No changes needed Disc lifstyle change with low sodium diet and exercise

## 2014-10-24 NOTE — Assessment & Plan Note (Signed)
Discussed how this problem influences overall health and the risks it imposes  Reviewed plan for weight loss with lower calorie diet (via better food choices and also portion control or program like weight watchers) and exercise building up to or more than 30 minutes 5 days per week including some aerobic activity    

## 2014-10-24 NOTE — Assessment & Plan Note (Signed)
Colonoscopy is utd from 6/15 with 5 year recall for polyps

## 2014-10-24 NOTE — Assessment & Plan Note (Signed)
Lab Results  Component Value Date   HGBA1C 8.1* 10/12/2014   Out of control  Also obese  Rev low glycemic diet - will ref to DM teaching  Inc metformin to 1000 bid -will update if side eff utd opth exam  Nl foot exam  Disc imp of wt loss

## 2014-10-24 NOTE — Progress Notes (Signed)
Pre visit review using our clinic review tool, if applicable. No additional management support is needed unless otherwise documented below in the visit note. 

## 2014-10-24 NOTE — Assessment & Plan Note (Signed)
Disc imp of exercise to achieve HDL over 40  Disc goals for lipids and reasons to control them Rev labs with pt Rev low sat fat diet in detail

## 2014-11-21 ENCOUNTER — Encounter: Payer: Self-pay | Admitting: Family Medicine

## 2014-11-21 ENCOUNTER — Ambulatory Visit (INDEPENDENT_AMBULATORY_CARE_PROVIDER_SITE_OTHER): Payer: BLUE CROSS/BLUE SHIELD | Admitting: Family Medicine

## 2014-11-21 VITALS — BP 122/78 | HR 81 | Temp 98.5°F | Ht 61.25 in | Wt 228.2 lb

## 2014-11-21 DIAGNOSIS — N3001 Acute cystitis with hematuria: Secondary | ICD-10-CM

## 2014-11-21 DIAGNOSIS — R040 Epistaxis: Secondary | ICD-10-CM | POA: Diagnosis not present

## 2014-11-21 DIAGNOSIS — R300 Dysuria: Secondary | ICD-10-CM

## 2014-11-21 LAB — POCT URINALYSIS DIPSTICK
Bilirubin, UA: 1
Blood, UA: NEGATIVE
Glucose, UA: NEGATIVE
Ketones, UA: 5
Nitrite, UA: POSITIVE
Protein, UA: 1
Spec Grav, UA: >=1.03
Urobilinogen, UA: 1
pH, UA: 5

## 2014-11-21 MED ORDER — CIPROFLOXACIN HCL 250 MG PO TABS: 250.0000 mg | ORAL_TABLET | Freq: Two times a day (BID) | ORAL | Status: DC

## 2014-11-21 NOTE — Progress Notes (Signed)
Subjective:    Patient ID: Taylor Grimes, female    DOB: 1961-07-06, 54 y.o.   MRN: [id number]  HPI Here with many acute issues   Dysuria Low back pain  Looks like a bit of blood in it  Frequent urination  Started several days ago   Nosebleed- times 2  Less than 5 minutes  ? Sinus issues Thinks it was both nostrils  Goes down the back of her throat and makes her cough  Not currently using flonase  No sinus pain  Has been a little dizzy Not sneezing   Does not drink enough water   Patient Active Problem List   Diagnosis Date Noted  . Low HDL (under 40) 10/24/2014  . Routine general medical examination at a health care facility 03/08/2014  . UTI (urinary tract infection) 12/05/2013  . Malignant neoplasm of corpus uteri 12/15/2012  . Post-menopausal bleeding 11/01/2012  . Colon cancer screening 11/01/2012  . DM type 2 (diabetes mellitus, type 2) 06/30/2012  . Varicose veins of leg with swelling, left 11/18/2011  . Obesity 11/12/2010  . Essential hypertension 07/12/2009  . Hypothyroidism 05/16/2009  . TRANSAMINASES, SERUM, ELEVATED 05/16/2009   Past Medical History  Diagnosis Date  . UTI (urinary tract infection)   . Anxiety   . Breast lesion     benign  . Hypothyroid   . HTN (hypertension)   . Elevated transaminase level     ? fatty liver   . Diabetes mellitus without complication    Past Surgical History  Procedure Laterality Date  . Breast biopsy      benign cyst 2011  . Cesarean section    . Laparoscopic total hysterectomy  11/2012   History  Substance Use Topics  . Smoking status: Former Smoker    Quit date: 07/22/1979  . Smokeless tobacco: Never Used     Comment: briefly smoked at 16  . Alcohol Use: No     Comment: rarely   Family History  Problem Relation Age of Onset  . Hypothyroidism Mother   . Alzheimer's disease Mother   . Stroke Father   . Deep vein thrombosis Sister   . Colon cancer Neg Hx   . Esophageal cancer Neg Hx   . Stomach  cancer Neg Hx   . Rectal cancer Neg Hx    No Known Allergies Current Outpatient Prescriptions on File Prior to Visit  Medication Sig Dispense Refill  . fluticasone (FLONASE) 50 MCG/ACT nasal spray Place 2 sprays into both nostrils daily. 16 g 6  . levothyroxine (SYNTHROID, LEVOTHROID) 125 MCG tablet Take 1 tablet (125 mcg total) by mouth daily. 90 tablet 3  . losartan (COZAAR) 50 MG tablet Take 1 tablet (50 mg total) by mouth daily. 30 tablet 11  . metFORMIN (GLUCOPHAGE) 1000 MG tablet Take 1 tablet (1,000 mg total) by mouth 2 (two) times daily with a meal. 60 tablet 11   No current facility-administered medications on file prior to visit.     Review of Systems Review of Systems  Constitutional: Negative for fever, appetite change, fatigue and unexpected weight change.  ENT pos for intermittent rhinorrhea and nosebleeds/neg for sinus pain  Eyes: Negative for pain and visual disturbance.  Respiratory: Negative for cough and shortness of breath.   Cardiovascular: Negative for cp or palpitations    Gastrointestinal: Negative for nausea, diarrhea and constipation.  Genitourinary: pos for urgency and frequency. ? If pos for flank pain and low back pain  Skin: Negative for  pallor or rash   Neurological: Negative for weakness, numbness and headaches.  Hematological: Negative for adenopathy. Does not bruise/bleed easily.  Psychiatric/Behavioral: Negative for dysphoric mood. The patient is not nervous/anxious.         Objective:   Physical Exam  Constitutional: Taylor Grimes appears well-developed and well-nourished. No distress.  HENT:  Head: Normocephalic and atraumatic.  Right Ear: External ear normal.  Left Ear: External ear normal.  Mouth/Throat: Oropharynx is clear and moist.  Nares are boggy- with scab on R septum No active bleeding  No sinus tenderness Clear rhinorrhea and post nasal drip   Eyes: Conjunctivae and EOM are normal. Pupils are equal, round, and reactive to light. Right eye  exhibits no discharge. Left eye exhibits no discharge.  Neck: Normal range of motion. Neck supple.  Cardiovascular: Normal rate and normal heart sounds.   Pulmonary/Chest: Effort normal and breath sounds normal. No respiratory distress. Taylor Grimes has no wheezes. Taylor Grimes has no rales. Taylor Grimes exhibits no tenderness.  Abdominal: Soft. Bowel sounds are normal. Taylor Grimes exhibits no distension and no mass. There is tenderness. There is no rebound and no guarding.  Mild suprapubic tenderness  No cva tenderness   Musculoskeletal: Taylor Grimes exhibits no edema.  Lymphadenopathy:    Taylor Grimes has no cervical adenopathy.  Neurological: Taylor Grimes is alert.  Skin: Skin is warm and dry. No rash noted.  Psychiatric: Taylor Grimes has a normal mood and affect.          Assessment & Plan:   Problem List Items Addressed This Visit      Respiratory   Epistaxis    Disc prev with use of vaseline in nostrils at bedtime/ use of vaporizer  Disc what to do for a nosebleed and when to go to ER See AVS  Update if no imp         Genitourinary   UTI (urinary tract infection) - Primary    sympt with pos ua  tx with cipro  Fluids encouraged Culture pending  Update if not starting to improve in a week or if worsening        Relevant Orders   Urine culture (Completed)    Other Visit Diagnoses    Dysuria        Relevant Orders    Urinalysis Dipstick (Completed)

## 2014-11-21 NOTE — Patient Instructions (Signed)
For urinary infection -take cipro 250 mg twice daily for 7 days Drink lots of water  We will contact you with a urine culture result  If symptoms worsen in the meantime let me know  The nosebleeds may be allergy related  Breathe steam and /or use a vaporizer in bedroom to keep you nose from drying out  Also try a bit of vaseline in each nostril at bedtime to prevent cracking  If you have a nosebleed- pinch firmly for about 10 minutes- that will usually stop it , if you bleed for more than 20 minutes -go to the ER  Do not start flonase until nosebleeds stop  Update me if no improvement

## 2014-11-21 NOTE — Progress Notes (Signed)
Pre visit review using our clinic review tool, if applicable. No additional management support is needed unless otherwise documented below in the visit note. 

## 2014-11-23 LAB — URINE CULTURE: Colony Count: >=100000

## 2014-11-23 NOTE — Assessment & Plan Note (Signed)
Disc prev with use of vaseline in nostrils at bedtime/ use of vaporizer  Disc what to do for a nosebleed and when to go to ER See AVS  Update if no imp

## 2014-11-23 NOTE — Assessment & Plan Note (Signed)
sympt with pos ua  tx with cipro  Fluids encouraged Culture pending  Update if not starting to improve in a week or if worsening

## 2014-12-20 ENCOUNTER — Ambulatory Visit (INDEPENDENT_AMBULATORY_CARE_PROVIDER_SITE_OTHER): Payer: BLUE CROSS/BLUE SHIELD | Admitting: Family Medicine

## 2014-12-20 ENCOUNTER — Encounter: Payer: Self-pay | Admitting: Family Medicine

## 2014-12-20 ENCOUNTER — Ambulatory Visit (INDEPENDENT_AMBULATORY_CARE_PROVIDER_SITE_OTHER)
Admission: RE | Admit: 2014-12-20 | Discharge: 2014-12-20 | Disposition: A | Discharge status: Home / Self Care | Payer: BLUE CROSS/BLUE SHIELD | Source: Ambulatory Visit | Attending: Family Medicine | Admitting: Family Medicine

## 2014-12-20 VITALS — BP 130/70 | HR 89 | Temp 98.7°F | Ht 61.25 in | Wt 224.8 lb

## 2014-12-20 DIAGNOSIS — M25511 Pain in right shoulder: Secondary | ICD-10-CM | POA: Insufficient documentation

## 2014-12-20 MED ORDER — MELOXICAM 15 MG PO TABS: 15.0000 mg | ORAL_TABLET | Freq: Every day | ORAL | Status: DC

## 2014-12-20 NOTE — Progress Notes (Signed)
Pre visit review using our clinic review tool, if applicable. No additional management support is needed unless otherwise documented below in the visit note. 

## 2014-12-20 NOTE — Progress Notes (Signed)
Subjective:    Patient ID: Taylor Grimes, female    DOB: 19-Dec-1960, 54 y.o.   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 Epistaxis 11/21/2014  . Low HDL (under 40) 10/24/2014  . Routine general medical examination at a health care facility 03/08/2014  . UTI (urinary tract infection) 12/05/2013  . Malignant neoplasm of corpus uteri 12/15/2012  . Post-menopausal bleeding 11/01/2012  . Colon cancer screening 11/01/2012  . DM type 2 (diabetes mellitus, type 2) 06/30/2012  . Varicose veins of leg with swelling, left 11/18/2011  . Obesity 11/12/2010  . Essential hypertension 07/12/2009  . Hypothyroidism 05/16/2009  . TRANSAMINASES, SERUM, ELEVATED 05/16/2009   Past Medical History  Diagnosis Date  . UTI (urinary tract infection)   . Anxiety   . Breast lesion     benign  . Hypothyroid   . HTN (hypertension)   . Elevated transaminase level     ? fatty liver   . Diabetes mellitus without complication    Past Surgical History  Procedure Laterality Date  . Breast biopsy      benign cyst 2011  . Cesarean section    . Laparoscopic total hysterectomy  11/2012   History  Substance Use Topics  . Smoking status: Former Smoker    Quit date: 07/22/1979  . Smokeless tobacco: Never Used     Comment: briefly smoked at 16  . Alcohol Use: No     Comment: rarely   Family History  Problem Relation Age of Onset  . Hypothyroidism Mother   . Alzheimer's disease  Mother   . Stroke Father   . Deep vein thrombosis Sister   . Colon cancer Neg Hx   . Esophageal cancer Neg Hx   . Stomach cancer Neg Hx   . Rectal cancer Neg Hx    No Known Allergies Current Outpatient Prescriptions on File Prior to Visit  Medication Sig Dispense Refill  . fluticasone (FLONASE) 50 MCG/ACT nasal spray Place 2 sprays into both nostrils daily. 16 g 6  . levothyroxine (SYNTHROID, LEVOTHROID) 125 MCG tablet Take 1 tablet (125 mcg total) by mouth daily. 90 tablet 3  . losartan (COZAAR) 50 MG tablet Take 1 tablet (50 mg total) by mouth daily. 30 tablet 11  . metFORMIN (GLUCOPHAGE) 1000 MG tablet Take 1 tablet (1,000 mg total) by mouth 2 (two) times daily with a meal. 60 tablet 11   No current facility-administered medications on file prior to visit.     Review of Systems Review of Systems  Constitutional: Negative for fever, appetite change, fatigue and unexpected weight change.  Eyes: Negative for pain and visual disturbance.  Respiratory: Negative for cough and shortness of breath.   Cardiovascular: Negative for cp or palpitations    Gastrointestinal: Negative for nausea, diarrhea and constipation.  Genitourinary: Negative for urgency and frequency.  Skin: Negative for pallor or rash   MSK pos for R shoulder and arm pain worse with movement  Neurological: Negative for weakness, light-headedness, numbness and headaches.  Hematological: Negative for adenopathy. Does not bruise/bleed easily.  Psychiatric/Behavioral: Negative for dysphoric mood. The patient is not nervous/anxious.         Objective:   Physical Exam  Constitutional: She appears well-developed and well-nourished. No distress.  obese and well appearing   Eyes: Conjunctivae and EOM are normal. Pupils are equal, round, and reactive to light. No scleral icterus.  Neck: Normal range of motion. Neck supple.  No cervical or trapezius tenderness Nl rom neck   Cardiovascular: Normal rate and regular rhythm.     Pulmonary/Chest: Effort normal and breath sounds normal. No respiratory distress. She has no wheezes. She has no rales.  Musculoskeletal: She exhibits tenderness. She exhibits no edema.       Right shoulder: She exhibits decreased range of motion and tenderness. She exhibits no bony tenderness, no swelling, no effusion, no crepitus, no deformity, normal pulse and normal strength.  Mildly tender over acromion  Pain to abduct/ flex and extent past 90 degrees, but fairly nl int and external rotation   No swelling or signs of trauma   Nl sens and perf    Lymphadenopathy:    She has no cervical adenopathy.  Neurological: She is alert. She has normal strength and normal reflexes. She displays no atrophy. No sensory deficit. She exhibits normal muscle tone.  Skin: Skin is warm and dry. No rash noted. No erythema. No pallor.  Psychiatric: She has a normal mood and affect.          Assessment & Plan:   Problem List Items Addressed This Visit    Right shoulder pain - Primary    Suspect tendonitis with fair rom but difficulty fully abducting  Adv ice  Rev passive rom exercise  Xray today  Px meloxicam 15 mg daily with food  May need injection if no improvement  Cautioned against heavy lifting with this arm until improved        Relevant Orders   DG Shoulder Right

## 2014-12-20 NOTE — Patient Instructions (Signed)
Xray now  Use ice on your arm and shoulder as often as possible for 10 minutes at a time Try the meloxicam instead of ibuprofen - with food once daily  Do the finger crawl on the wall exercise to keep shoulder from getting stiff We will contact you with xray report when it is back

## 2014-12-20 NOTE — Assessment & Plan Note (Signed)
Suspect tendonitis with fair rom but difficulty fully abducting  Adv ice  Rev passive rom exercise  Xray today  Px meloxicam 15 mg daily with food  May need injection if no improvement  Cautioned against heavy lifting with this arm until improved

## 2015-01-11 ENCOUNTER — Ambulatory Visit (INDEPENDENT_AMBULATORY_CARE_PROVIDER_SITE_OTHER): Payer: BLUE CROSS/BLUE SHIELD | Admitting: Family Medicine

## 2015-01-11 ENCOUNTER — Encounter: Payer: Self-pay | Admitting: Family Medicine

## 2015-01-11 VITALS — BP 124/78 | HR 89 | Temp 98.4°F | Ht 61.25 in | Wt 226.0 lb

## 2015-01-11 DIAGNOSIS — M7551 Bursitis of right shoulder: Secondary | ICD-10-CM | POA: Diagnosis not present

## 2015-01-11 DIAGNOSIS — M7581 Other shoulder lesions, right shoulder: Secondary | ICD-10-CM

## 2015-01-11 DIAGNOSIS — M79601 Pain in right arm: Secondary | ICD-10-CM

## 2015-01-11 DIAGNOSIS — M7541 Impingement syndrome of right shoulder: Secondary | ICD-10-CM

## 2015-01-11 MED ORDER — METHYLPREDNISOLONE ACETATE 40 MG/ML IJ SUSP
80.0000 mg | Freq: Once | INTRAMUSCULAR | Status: AC
  Administered 2015-01-11: 80 mg via INTRA_ARTICULAR

## 2015-01-11 NOTE — Progress Notes (Signed)
Dr. Frederico Hamman T. Harmoney Sienkiewicz, MD, [facility] [place], [address] Phone: [phone] Fax: [phone]  01/11/2015  Patient: Taylor Grimes, MRN: [id number], DOB: 28-Jul-1960, 54 y.o.  Primary Physician:  Loura Pardon, MD  Chief Complaint: Eval Right arm pain  Subjective:   This 54 y.o. female patient noted above presents with shoulder pain that has been ongoing for 2-3 mo. there is no history of trauma or accident recently The patient denies neck pain or radicular symptoms. Denies dislocation, subluxation, separation of the shoulder. The patient does complain of pain in the overhead plane with significant painful arc of motion.  Medications Tried: Tylenol, NSAIDS Ice or Heat: minimally helpful Tried PT: No  Prior shoulder Injury: No Prior surgery: No Prior fracture: No  Has been taking some mobic. Hurts if laying on it and moving abd.  RTC, subac.   The PMH, PSH, Social History, Family History, Medications, and allergies have been reviewed in [facility], and have been updated if relevant.  Patient Active Problem List   Diagnosis Date Noted  . Right shoulder pain 12/20/2014  . Epistaxis 11/21/2014  . Low HDL (under 40) 10/24/2014  . Routine general medical examination at a health care facility 03/08/2014  . UTI (urinary tract infection) 12/05/2013  . Malignant neoplasm of corpus uteri 12/15/2012  . Post-menopausal bleeding 11/01/2012  . Colon cancer screening 11/01/2012  . DM type 2 (diabetes mellitus, type 2) 06/30/2012  . Varicose veins of leg with swelling, left 11/18/2011  . Obesity 11/12/2010  . Essential hypertension 07/12/2009  . Hypothyroidism 05/16/2009  . TRANSAMINASES, SERUM, ELEVATED 05/16/2009    Past Medical History  Diagnosis Date  . UTI (urinary tract infection)   . Anxiety   . Breast lesion     benign  . Hypothyroid   . HTN (hypertension)   . Elevated transaminase level     ? fatty liver   .  Diabetes mellitus without complication     Past Surgical History  Procedure Laterality Date  . Breast biopsy      benign cyst 2011  . Cesarean section    . Laparoscopic total hysterectomy  11/2012    History   Social History  . Marital Status: Legally Separated    Spouse Name: N/A  . Number of Children: 2  . Years of Education: N/A   Occupational History  . shift manager  [employer]   Social History Main Topics  . Smoking status: Former Smoker    Quit date: 07/22/1979  . Smokeless tobacco: Never Used     Comment: briefly smoked at 16  . Alcohol Use: No     Comment: rarely  . Drug Use: No  . Sexual Activity: Yes    Birth Control/ Protection: Post-menopausal   Other Topics Concern  . Not on file   Social History Narrative    Family History  Problem Relation Age of Onset  . Hypothyroidism Mother   . Alzheimer's disease Mother   . Stroke Father   . Deep vein thrombosis Sister   . Colon cancer Neg Hx   . Esophageal cancer Neg Hx   . Stomach cancer Neg Hx   . Rectal cancer Neg Hx     No Known Allergies  Medication list reviewed and updated in full in [place].  GEN: No fevers, chills. Nontoxic. Primarily MSK c/o today. MSK: Detailed in the HPI GI: tolerating PO intake without difficulty Neuro: No numbness, parasthesias,  or tingling associated. Otherwise the pertinent positives of the ROS are noted above.   Objective:   Blood pressure 124/78, pulse 89, temperature 98.4 F (36.9 C), temperature source Oral, height 5' 1.25" (1.556 m), weight 226 lb (102.513 kg), last menstrual period 07/21/2008, SpO2 95 %.  GEN: Well-developed,well-nourished,in no acute distress; alert,appropriate and cooperative throughout examination HEENT: Normocephalic and atraumatic without obvious abnormalities. Ears, externally no deformities PULM: Breathing comfortably in no respiratory distress EXT: No clubbing, cyanosis, or edema PSYCH: Normally interactive.  Cooperative during the interview. Pleasant. Friendly and conversant. Not anxious or depressed appearing. Normal, full affect.  Shoulder: R Inspection: No muscle wasting or winging Ecchymosis/edema: neg  AC joint, scapula, clavicle: NT Cervical spine: NT, full ROM Spurling's: neg Abduction: full, 5/5, painful arc Flexion: full, 5/5 IR, full, lift-off: 5/5 ER at neutral: full, 5/5 AC crossover: neg Neer: pos Hawkins: pos Drop Test: neg Empty Can: pos Supraspinatus insertion: mild-mod T Bicipital groove: NT Speed's: neg Yergason's: neg Sulcus sign: neg Scapular dyskinesis: none C5-T1 intact  Neuro: Sensation intact Grip 5/5   Radiology: No results found.  Assessment and Plan:    Shoulder impingement, right  Right arm pain - Plan: methylPREDNISolone acetate (DEPO-MEDROL) injection 80 mg  Rotator cuff tendonitis, right  Subacromial bursitis, right  Rotator cuff strengthening and scapular stabilization exercises were reviewed with the patient.  [facility] RTC and scapular stabilization program or MOON shoulder protocol given to the patient. Retraining shoulder mechanics and function was emphasized to the patient with rehab done at least 5-6 days a week.  The patient could benefit from formal PT to assist with scapular stabilization and RTC strengthening - for now wants to try MOON on her own.  SubAC Injection Verbal consent was obtained from the patient. Risks (including rare infection), benefits, and alternatives were explained. Patient prepped with Chloraprep and Ethyl Chloride used for anesthesia. The subacromial space was injected using the posterior approach. The patient tolerated the procedure well and had decreased pain post injection. No complications. Injection: 8 cc of Lidocaine 1% and Depo-Medrol 80 mg. Needle: 21 gauge, 2 inch   Follow-up: No Follow-up on file.  New Prescriptions   No medications on file   No orders of the defined types were placed in this  encounter.    Signed,  Maud Deed. Marija Calamari, MD   Patient's Medications  New Prescriptions   No medications on file  Previous Medications   FLUTICASONE (FLONASE) 50 MCG/ACT NASAL SPRAY    Place 2 sprays into both nostrils daily.   LEVOTHYROXINE (SYNTHROID, LEVOTHROID) 125 MCG TABLET    Take 1 tablet (125 mcg total) by mouth daily.   LOSARTAN (COZAAR) 50 MG TABLET    Take 1 tablet (50 mg total) by mouth daily.   MELOXICAM (MOBIC) 15 MG TABLET    Take 1 tablet (15 mg total) by mouth daily. Take with food   METFORMIN (GLUCOPHAGE) 1000 MG TABLET    Take 1 tablet (1,000 mg total) by mouth 2 (two) times daily with a meal.  Modified Medications   No medications on file  Discontinued Medications   No medications on file

## 2015-01-11 NOTE — Progress Notes (Signed)
Pre visit review using our clinic review tool, if applicable. No additional management support is needed unless otherwise documented below in the visit note. 

## 2015-01-16 ENCOUNTER — Other Ambulatory Visit (INDEPENDENT_AMBULATORY_CARE_PROVIDER_SITE_OTHER): Payer: BLUE CROSS/BLUE SHIELD

## 2015-01-16 DIAGNOSIS — E119 Type 2 diabetes mellitus without complications: Secondary | ICD-10-CM | POA: Diagnosis not present

## 2015-01-16 LAB — HEMOGLOBIN A1C: Hgb A1c MFr Bld: 7.1 % — ABNORMAL HIGH (ref 4.6–6.5)

## 2015-01-23 ENCOUNTER — Ambulatory Visit (INDEPENDENT_AMBULATORY_CARE_PROVIDER_SITE_OTHER): Payer: BLUE CROSS/BLUE SHIELD | Admitting: Family Medicine

## 2015-01-23 ENCOUNTER — Encounter: Payer: Self-pay | Admitting: Family Medicine

## 2015-01-23 VITALS — BP 130/78 | HR 72 | Temp 98.3°F | Ht 61.25 in | Wt 225.5 lb

## 2015-01-23 DIAGNOSIS — E119 Type 2 diabetes mellitus without complications: Secondary | ICD-10-CM

## 2015-01-23 DIAGNOSIS — M546 Pain in thoracic spine: Secondary | ICD-10-CM | POA: Diagnosis not present

## 2015-01-23 DIAGNOSIS — E669 Obesity, unspecified: Secondary | ICD-10-CM

## 2015-01-23 DIAGNOSIS — I10 Essential (primary) hypertension: Secondary | ICD-10-CM

## 2015-01-23 MED ORDER — CYCLOBENZAPRINE HCL 10 MG PO TABS
5.0000 mg | ORAL_TABLET | Freq: Three times a day (TID) | ORAL | Status: DC | PRN
Start: 2015-01-23 — End: 2015-08-10

## 2015-01-23 NOTE — Assessment & Plan Note (Signed)
Lab Results  Component Value Date   HGBA1C 7.1* 01/16/2015   Down from 8.1- great! Working on diet  Tolerates metformin Will continue to work on wt loss and diet/exercise  F/u 56mo

## 2015-01-23 NOTE — Assessment & Plan Note (Signed)
Suspect spasm  Reassuring exam- tender med to scapulae bilat and some pain with neck movement Px flexeril Disc stretching and use of heat Update if not starting to improve in a week or if worsening

## 2015-01-23 NOTE — Progress Notes (Signed)
Subjective:    Patient ID: Taylor Grimes, female    DOB: 22-Feb-1961, 54 y.o.   MRN: [id number]  HPI Here for f/u of chronic problems and new back pain   3 d of back pain  800 mg of ibuprofen does not help  In the middle all the way across - around both shoulder blades It "jumps" like a muscle spasm when she moves or jumps Does lift at work - tries to limit this   Wt is down 1 lb Obese with bmi of 42   Diabetes- doing better/ more energy  Home sugar results  DM diet - doing better with that  Exercise -work Symptoms none  A1C last  Lab Results  Component Value Date   HGBA1C 7.1* 01/16/2015  down form 8.1 -- a big improvement  No problems with medications -on inc metformin 1000 mg  Renal protection arb Last eye exam -was sept   bp is stable today  No cp or palpitations or headaches or edema  No side effects to medicines  BP Readings from Last 3 Encounters:  01/23/15 130/78  01/11/15 124/78  12/20/14 130/70     Patient Active Problem List   Diagnosis Date Noted  . Right shoulder pain 12/20/2014  . Epistaxis 11/21/2014  . Low HDL (under 40) 10/24/2014  . Routine general medical examination at a health care facility 03/08/2014  . UTI (urinary tract infection) 12/05/2013  . Malignant neoplasm of corpus uteri 12/15/2012  . Post-menopausal bleeding 11/01/2012  . Colon cancer screening 11/01/2012  . DM type 2 (diabetes mellitus, type 2) 06/30/2012  . Varicose veins of leg with swelling, left 11/18/2011  . Obesity 11/12/2010  . Essential hypertension 07/12/2009  . Hypothyroidism 05/16/2009  . TRANSAMINASES, SERUM, ELEVATED 05/16/2009   Past Medical History  Diagnosis Date  . UTI (urinary tract infection)   . Anxiety   . Breast lesion     benign  . Hypothyroid   . HTN (hypertension)   . Elevated transaminase level     ? fatty liver   . Diabetes mellitus without complication    Past Surgical History  Procedure Laterality Date  . Breast biopsy      benign  cyst 2011  . Cesarean section    . Laparoscopic total hysterectomy  11/2012   History  Substance Use Topics  . Smoking status: Former Smoker    Quit date: 07/22/1979  . Smokeless tobacco: Never Used     Comment: briefly smoked at 16  . Alcohol Use: No     Comment: rarely   Family History  Problem Relation Age of Onset  . Hypothyroidism Mother   . Alzheimer's disease Mother   . Stroke Father   . Deep vein thrombosis Sister   . Colon cancer Neg Hx   . Esophageal cancer Neg Hx   . Stomach cancer Neg Hx   . Rectal cancer Neg Hx    No Known Allergies Current Outpatient Prescriptions on File Prior to Visit  Medication Sig Dispense Refill  . fluticasone (FLONASE) 50 MCG/ACT nasal spray Place 2 sprays into both nostrils daily. 16 g 6  . levothyroxine (SYNTHROID, LEVOTHROID) 125 MCG tablet Take 1 tablet (125 mcg total) by mouth daily. 90 tablet 3  . losartan (COZAAR) 50 MG tablet Take 1 tablet (50 mg total) by mouth daily. 30 tablet 11  . meloxicam (MOBIC) 15 MG tablet Take 1 tablet (15 mg total) by mouth daily. Take with food 30 tablet 1  .  metFORMIN (GLUCOPHAGE) 1000 MG tablet Take 1 tablet (1,000 mg total) by mouth 2 (two) times daily with a meal. 60 tablet 11   No current facility-administered medications on file prior to visit.        Review of Systems Review of Systems  Constitutional: Negative for fever, appetite change, fatigue and unexpected weight change.  Eyes: Negative for pain and visual disturbance.  Respiratory: Negative for cough and shortness of breath.   Cardiovascular: Negative for cp or palpitations    Gastrointestinal: Negative for nausea, diarrhea and constipation.  Genitourinary: Negative for urgency and frequency.  Skin: Negative for pallor or rash   MSK pos for middle back pain that occurs when lifting or changing position  Neurological: Negative for weakness, light-headedness, numbness and headaches.  Hematological: Negative for adenopathy. Does not  bruise/bleed easily.  Psychiatric/Behavioral: Negative for dysphoric mood. The patient is not nervous/anxious.         Objective:   Physical Exam  Constitutional: She appears well-developed and well-nourished. No distress.  obese and well appearing   HENT:  Head: Normocephalic and atraumatic.  Mouth/Throat: Oropharynx is clear and moist.  Eyes: Conjunctivae and EOM are normal. Pupils are equal, round, and reactive to light.  Neck: Normal range of motion. Neck supple. No JVD present. Carotid bruit is not present. No thyromegaly present.  Cardiovascular: Normal rate, regular rhythm, normal heart sounds and intact distal pulses.  Exam reveals no gallop.   Pulmonary/Chest: Effort normal and breath sounds normal. No respiratory distress. She has no wheezes. She has no rales.  No crackles  Abdominal: Soft. Bowel sounds are normal. She exhibits no distension, no abdominal bruit and no mass. There is no tenderness.  Musculoskeletal: She exhibits tenderness. She exhibits no edema.  Tender in musculature medial to both scapulae but not on bone or spine  Pain to fully extend and rotate neck Nl rom arms No neuro symptoms   Lymphadenopathy:    She has no cervical adenopathy.  Neurological: She is alert. She has normal strength and normal reflexes. She displays no atrophy. No sensory deficit. She exhibits normal muscle tone.  Skin: Skin is warm and dry. No rash noted.  Psychiatric: She has a normal mood and affect.          Assessment & Plan:   Problem List Items Addressed This Visit    Back pain, thoracic    Suspect spasm  Reassuring exam- tender med to scapulae bilat and some pain with neck movement Px flexeril Disc stretching and use of heat Update if not starting to improve in a week or if worsening        Relevant Medications   cyclobenzaprine (FLEXERIL) 10 MG tablet   DM type 2 (diabetes mellitus, type 2)    Lab Results  Component Value Date   HGBA1C 7.1* 01/16/2015   Down  from 8.1- great! Working on diet  Tolerates metformin Will continue to work on wt loss and diet/exercise  F/u 51mo      Essential hypertension - Primary    bp in fair control at this time  BP Readings from Last 1 Encounters:  01/23/15 130/78   No changes needed Disc lifstyle change with low sodium diet and exercise   Lab rev F/u 24mo       Obesity    Discussed how this problem influences overall health and the risks it imposes  Reviewed plan for weight loss with lower calorie diet (via better food choices and also portion control  or program like weight watchers) and exercise building up to or more than 30 minutes 5 days per week including some aerobic activity

## 2015-01-23 NOTE — Progress Notes (Signed)
Pre visit review using our clinic review tool, if applicable. No additional management support is needed unless otherwise documented below in the visit note. 

## 2015-01-23 NOTE — Patient Instructions (Signed)
Diabetes is improved Keep working on diet and weight loss  Keep stretching Use heat on your back Try the flexeril with caution  Ibuprofen when needed  Update if not starting to improve in a week or if worsening    Follow up in 6 months

## 2015-01-23 NOTE — Assessment & Plan Note (Signed)
bp in fair control at this time  BP Readings from Last 1 Encounters:  01/23/15 130/78   No changes needed Disc lifstyle change with low sodium diet and exercise   Lab rev F/u 4mo

## 2015-01-23 NOTE — Assessment & Plan Note (Signed)
Discussed how this problem influences overall health and the risks it imposes  Reviewed plan for weight loss with lower calorie diet (via better food choices and also portion control or program like weight watchers) and exercise building up to or more than 30 minutes 5 days per week including some aerobic activity    

## 2015-04-16 ENCOUNTER — Other Ambulatory Visit: Payer: Self-pay

## 2015-04-16 DIAGNOSIS — Z1231 Encounter for screening mammogram for malignant neoplasm of breast: Secondary | ICD-10-CM

## 2015-05-17 ENCOUNTER — Ambulatory Visit
Admission: RE | Admit: 2015-05-17 | Discharge: 2015-05-17 | Disposition: A | Discharge status: Home / Self Care | Payer: BLUE CROSS/BLUE SHIELD | Source: Ambulatory Visit

## 2015-05-17 DIAGNOSIS — Z1231 Encounter for screening mammogram for malignant neoplasm of breast: Secondary | ICD-10-CM

## 2015-06-29 ENCOUNTER — Ambulatory Visit (INDEPENDENT_AMBULATORY_CARE_PROVIDER_SITE_OTHER): Payer: BLUE CROSS/BLUE SHIELD

## 2015-06-29 DIAGNOSIS — Z23 Encounter for immunization: Secondary | ICD-10-CM

## 2015-07-30 ENCOUNTER — Ambulatory Visit: Payer: BLUE CROSS/BLUE SHIELD | Admitting: Family Medicine

## 2015-08-07 ENCOUNTER — Ambulatory Visit: Payer: BLUE CROSS/BLUE SHIELD | Admitting: Family Medicine

## 2015-08-10 ENCOUNTER — Encounter: Payer: Self-pay | Admitting: Family Medicine

## 2015-08-10 ENCOUNTER — Ambulatory Visit (INDEPENDENT_AMBULATORY_CARE_PROVIDER_SITE_OTHER): Payer: PRIVATE HEALTH INSURANCE | Admitting: Family Medicine

## 2015-08-10 VITALS — BP 125/80 | HR 62 | Temp 98.1°F | Ht 61.25 in | Wt 220.0 lb

## 2015-08-10 DIAGNOSIS — E119 Type 2 diabetes mellitus without complications: Secondary | ICD-10-CM

## 2015-08-10 DIAGNOSIS — E039 Hypothyroidism, unspecified: Secondary | ICD-10-CM | POA: Diagnosis not present

## 2015-08-10 DIAGNOSIS — E786 Lipoprotein deficiency: Secondary | ICD-10-CM

## 2015-08-10 DIAGNOSIS — I10 Essential (primary) hypertension: Secondary | ICD-10-CM | POA: Diagnosis not present

## 2015-08-10 LAB — LIPID PANEL
Cholesterol: 179 mg/dL (ref 0–200)
HDL: 36.2 mg/dL — ABNORMAL LOW (ref >39.00)
LDL Cholesterol: 110 mg/dL — ABNORMAL HIGH (ref 0–99)
NonHDL: 142.88
Total CHOL/HDL Ratio: 5
Triglycerides: 163 mg/dL — ABNORMAL HIGH (ref 0.0–149.0)
VLDL: 32.6 mg/dL (ref 0.0–40.0)

## 2015-08-10 LAB — COMPREHENSIVE METABOLIC PANEL
ALT: 31 U/L (ref 0–35)
AST: 43 U/L — ABNORMAL HIGH (ref 0–37)
Albumin: 3.6 g/dL (ref 3.5–5.2)
Alkaline Phosphatase: 107 U/L (ref 39–117)
BUN: 11 mg/dL (ref 6–23)
CO2: 31 mEq/L (ref 19–32)
Calcium: 9.6 mg/dL (ref 8.4–10.5)
Chloride: 103 mEq/L (ref 96–112)
Creatinine, Ser: 0.85 mg/dL (ref 0.40–1.20)
GFR: 73.39 mL/min (ref >60.00)
Glucose, Bld: 127 mg/dL — ABNORMAL HIGH (ref 70–99)
Potassium: 4.4 mEq/L (ref 3.5–5.1)
Sodium: 141 mEq/L (ref 135–145)
Total Bilirubin: 0.6 mg/dL (ref 0.2–1.2)
Total Protein: 7.7 g/dL (ref 6.0–8.3)

## 2015-08-10 LAB — HEMOGLOBIN A1C: Hgb A1c MFr Bld: 7.3 % — ABNORMAL HIGH (ref 4.6–6.5)

## 2015-08-10 NOTE — Progress Notes (Signed)
Pre visit review using our clinic review tool, if applicable. No additional management support is needed unless otherwise documented below in the visit note. 

## 2015-08-10 NOTE — Patient Instructions (Signed)
Keep working on weight loss  Labs today  Exercise will help your good cholesterol (HDL) - eating fish (not shellfish)- or taking fish oil over the counter will help this too  Watch sugar and fat in your diet  Get you eye exam next month as planned

## 2015-08-10 NOTE — Progress Notes (Signed)
Subjective:    Patient ID: Taylor Grimes, female    DOB: 10/25/1960, 55 y.o.   MRN: [id number]  HPI Here for f/u of chronic health problems   Wt is down 5 lb with bmi of 41 Feeling ok  Not really doing anything to loose -but more active / lots of exercise at work   bp is inc on first check today  Took her cozaar today  No cp or palpitations or headaches or edema  No side effects to medicines  BP Readings from Last 3 Encounters:  08/10/15 140/76  01/23/15 130/78  01/11/15 124/78     2nd check improved  BP: 125/80 mmHg   Diabetes Home sugar results - has run low 100s in the am , does not check in the afternoon  DM diet - pretty good  Exercise -getting more at work Symptoms-none  A1C last  Lab Results  Component Value Date   HGBA1C 7.1* 01/16/2015  due for a check   No problems with medications -metformin  Renal protection=arb  Last eye exam -is about time for her eye exam -next month (no retinopathy)  Cholesterol Has hx of low HDL Lab Results  Component Value Date   CHOL 158 10/12/2014   HDL 33.70* 10/12/2014   LDLCALC 99 10/12/2014   TRIG 126.0 10/12/2014   CHOLHDL 5 10/12/2014   Hypothyroidism  Pt has no clinical changes No change in energy level/ hair or skin/ edema and no tremor Lab Results  Component Value Date   TSH 1.68 09/28/2014    Sees Dr Cruzita Lederer    Patient Active Problem List   Diagnosis Date Noted  . Back pain, thoracic 01/23/2015  . Right shoulder pain 12/20/2014  . Epistaxis 11/21/2014  . Low HDL (under 40) 10/24/2014  . Routine general medical examination at a health care facility 03/08/2014  . UTI (urinary tract infection) 12/05/2013  . Malignant neoplasm of corpus uteri ([place]) 12/15/2012  . Post-menopausal bleeding 11/01/2012  . Colon cancer screening 11/01/2012  . DM type 2 (diabetes mellitus, type 2) ([place]) 06/30/2012  . Varicose veins of leg with swelling, left 11/18/2011  . Morbid obesity ([place]) 11/12/2010  . Essential  hypertension 07/12/2009  . Hypothyroidism 05/16/2009  . TRANSAMINASES, SERUM, ELEVATED 05/16/2009   Past Medical History  Diagnosis Date  . UTI (urinary tract infection)   . Anxiety   . Breast lesion     benign  . Hypothyroid   . HTN (hypertension)   . Elevated transaminase level     ? fatty liver   . Diabetes mellitus without complication [facility])    Past Surgical History  Procedure Laterality Date  . Breast biopsy      benign cyst 2011  . Cesarean section    . Laparoscopic total hysterectomy  11/2012   Social History  Substance Use Topics  . Smoking status: Former Smoker    Quit date: 07/22/1979  . Smokeless tobacco: Never Used     Comment: briefly smoked at 16  . Alcohol Use: No     Comment: rarely   Family History  Problem Relation Age of Onset  . Hypothyroidism Mother   . Alzheimer's disease Mother   . Stroke Father   . Deep vein thrombosis Sister   . Colon cancer Neg Hx   . Esophageal cancer Neg Hx   . Stomach cancer Neg Hx   . Rectal cancer Neg Hx    No Known Allergies Current Outpatient Prescriptions on File Prior to Visit  Medication Sig Dispense Refill  . levothyroxine (SYNTHROID, LEVOTHROID) 125 MCG tablet Take 1 tablet (125 mcg total) by mouth daily. 90 tablet 3  . losartan (COZAAR) 50 MG tablet Take 1 tablet (50 mg total) by mouth daily. 30 tablet 11  . metFORMIN (GLUCOPHAGE) 1000 MG tablet Take 1 tablet (1,000 mg total) by mouth 2 (two) times daily with a meal. 60 tablet 11   No current facility-administered medications on file prior to visit.     Review of Systems Review of Systems  Constitutional: Negative for fever, appetite change,  and unexpected weight change.  Eyes: Negative for pain and visual disturbance.  Respiratory: Negative for cough and shortness of breath.   Cardiovascular: Negative for cp or palpitations    Gastrointestinal: Negative for nausea, diarrhea and constipation.  Genitourinary: Negative for urgency and frequency.  Skin:  Negative for pallor or rash   Neurological: Negative for weakness, light-headedness, numbness and headaches.  Hematological: Negative for adenopathy. Does not bruise/bleed easily.  Psychiatric/Behavioral: Negative for dysphoric mood. The patient is not nervous/anxious.         Objective:   Physical Exam  Constitutional: She appears well-developed and well-nourished. No distress.  Morbidly obese and well appearing   HENT:  Head: Normocephalic and atraumatic.  Mouth/Throat: Oropharynx is clear and moist.  Eyes: Conjunctivae and EOM are normal. Pupils are equal, round, and reactive to light.  Neck: Normal range of motion. Neck supple. No JVD present. Carotid bruit is not present. No thyromegaly present.  Cardiovascular: Normal rate, regular rhythm, normal heart sounds and intact distal pulses.  Exam reveals no gallop.   Pulmonary/Chest: Effort normal and breath sounds normal. No respiratory distress. She has no wheezes. She has no rales.  No crackles  Abdominal: Soft. Bowel sounds are normal. She exhibits no distension, no abdominal bruit and no mass. There is no tenderness.  Musculoskeletal: She exhibits no edema.  Lymphadenopathy:    She has no cervical adenopathy.  Neurological: She is alert. She has normal reflexes.  Skin: Skin is warm and dry. No rash noted.  Psychiatric: She has a normal mood and affect.          Assessment & Plan:   Problem List Items Addressed This Visit      Cardiovascular and Mediastinum   Essential hypertension - Primary    bp was better on 2nd check bp in fair control at this time  BP Readings from Last 1 Encounters:  08/10/15 125/80   No changes needed Disc lifstyle change with low sodium diet and exercise   Labs today      Relevant Orders   Comprehensive metabolic panel (Completed)   Lipid panel (Completed)     Endocrine   DM type 2 (diabetes mellitus, type 2) ([place])    Labs today for A1C Low glycemic diet and wt loss enc  Exercise as  tolerated Disc goals for control  On metformin       Relevant Orders   Hemoglobin A1c (Completed)   Hypothyroidism    Sees endocrinology Lab Results  Component Value Date   TSH 1.68 09/28/2014           Other   Low HDL (under 40)    Lipid panel today Disc goals for lipids and reasons to control them Rev labs with pt-from last draw  Rev low sat fat diet in detail -enc exercise and fish/omega 3 fish oil to help her HDL      Morbid obesity ([place])  Discussed how this problem influences overall health and the risks it imposes  Reviewed plan for weight loss with lower calorie diet (via better food choices and also portion control or program like weight watchers) and exercise building up to or more than 30 minutes 5 days per week including some aerobic activity

## 2015-08-12 NOTE — Assessment & Plan Note (Signed)
Labs today for A1C Low glycemic diet and wt loss enc  Exercise as tolerated Disc goals for control  On metformin

## 2015-08-12 NOTE — Assessment & Plan Note (Signed)
Lipid panel today Disc goals for lipids and reasons to control them Rev labs with pt-from last draw  Rev low sat fat diet in detail -enc exercise and fish/omega 3 fish oil to help her HDL

## 2015-08-12 NOTE — Assessment & Plan Note (Signed)
Discussed how this problem influences overall health and the risks it imposes  Reviewed plan for weight loss with lower calorie diet (via better food choices and also portion control or program like weight watchers) and exercise building up to or more than 30 minutes 5 days per week including some aerobic activity    

## 2015-08-12 NOTE — Assessment & Plan Note (Signed)
Sees endocrinology Lab Results  Component Value Date   TSH 1.68 09/28/2014

## 2015-08-12 NOTE — Assessment & Plan Note (Signed)
bp was better on 2nd check bp in fair control at this time  BP Readings from Last 1 Encounters:  08/10/15 125/80   No changes needed Disc lifstyle change with low sodium diet and exercise   Labs today

## 2015-08-13 ENCOUNTER — Encounter: Payer: Self-pay | Admitting: *Deleted

## 2015-09-12 ENCOUNTER — Ambulatory Visit (INDEPENDENT_AMBULATORY_CARE_PROVIDER_SITE_OTHER): Payer: PRIVATE HEALTH INSURANCE | Admitting: Family Medicine

## 2015-09-12 ENCOUNTER — Encounter: Payer: Self-pay | Admitting: Family Medicine

## 2015-09-12 VITALS — BP 110/70 | HR 80 | Temp 98.3°F | Wt 214.1 lb

## 2015-09-12 DIAGNOSIS — E119 Type 2 diabetes mellitus without complications: Secondary | ICD-10-CM | POA: Diagnosis not present

## 2015-09-12 DIAGNOSIS — F430 Acute stress reaction: Secondary | ICD-10-CM | POA: Diagnosis not present

## 2015-09-12 DIAGNOSIS — I10 Essential (primary) hypertension: Secondary | ICD-10-CM

## 2015-09-12 DIAGNOSIS — F419 Anxiety disorder, unspecified: Secondary | ICD-10-CM | POA: Insufficient documentation

## 2015-09-12 MED ORDER — GLIPIZIDE ER 2.5 MG PO TB24: 2.5000 mg | ORAL_TABLET | Freq: Every day | ORAL | Status: DC

## 2015-09-12 NOTE — Patient Instructions (Signed)
For diabetes-keep working on diabetic (low carb and low fat) diet  Keep loosing weight and stay active Continue metformin Add glipizide 2.5 mg one pill daily with breakfast Watch blood sugar-if low (below 70 or symptomatic)-please alert me Work on looking for another job  For cholesterol (Avoid red meat/ fried foods/ egg yolks/ fatty breakfast meats/ butter, cheese and high fat dairy/ and shellfish)   Follow up in 3 month with labs prior

## 2015-09-12 NOTE — Progress Notes (Signed)
Subjective:    Patient ID: Taylor Grimes, female    DOB: [birth year], 55 y.o.   MRN: [id number]  HPI Here for f/u of chronic health problems   Wt is down 6 lb  Drinking lemon water instead of drinks with sugar  Also dec bread and fats  bmi of 40 Morbid obese range  Diabetes Home sugar results in the am is usually around 140 , later in the day- lower - around 100 (she goes to bed at 6-7  DM diet -improved since last visit  Exercise - on feet all day-nothing additionally (10 hour shifts)  Symptoms A1C last  Lab Results  Component Value Date   HGBA1C 7.3* 08/10/2015  was 7.1   No problems with medications  Renal protection ARB  Last eye exam - has not had it yet (usually [facility] sends her a card when she is due)  No time for DM teaching  Has books to review     Lipids Lab Results  Component Value Date   CHOL 179 08/10/2015   CHOL 158 10/12/2014   CHOL 167 03/09/2014   Lab Results  Component Value Date   HDL 36.20* 08/10/2015   HDL 33.70* 10/12/2014   HDL 33.10* 03/09/2014   Lab Results  Component Value Date   LDLCALC 110* 08/10/2015   LDLCALC 99 10/12/2014   LDLCALC 104* 03/09/2014   Lab Results  Component Value Date   TRIG 163.0* 08/10/2015   TRIG 126.0 10/12/2014   TRIG 148.0 03/09/2014   Lab Results  Component Value Date   CHOLHDL 5 08/10/2015   CHOLHDL 5 10/12/2014   CHOLHDL 5 03/09/2014   No results found for: LDLDIRECT   HDL is getting better  LDL 110- goal is below 100    Thinks she had a panic attack at work the other day Became acutely anxious  Lasted 5 minutes  Did not pass out Sat down/ drank water and it got better  Work has been very stressful  Works at [employer] - does not get along with boss- and thinks she is discriminated against  Looking for another job   bp is stable today  No cp or palpitations or headaches or edema  No side effects to medicines  BP Readings from Last 3 Encounters:  09/12/15 110/70  08/10/15 125/80    01/23/15 130/78      Patient Active Problem List   Diagnosis Date Noted  . Back pain, thoracic 01/23/2015  . Right shoulder pain 12/20/2014  . Epistaxis 11/21/2014  . Low HDL (under 40) 10/24/2014  . Routine general medical examination at a health care facility 03/08/2014  . UTI (urinary tract infection) 12/05/2013  . Malignant neoplasm of corpus uteri ([place]) 12/15/2012  . Post-menopausal bleeding 11/01/2012  . Colon cancer screening 11/01/2012  . DM type 2 (diabetes mellitus, type 2) ([place]) 06/30/2012  . Varicose veins of leg with swelling, left 11/18/2011  . Morbid obesity ([place]) 11/12/2010  . Essential hypertension 07/12/2009  . Hypothyroidism 05/16/2009  . TRANSAMINASES, SERUM, ELEVATED 05/16/2009   Past Medical History  Diagnosis Date  . UTI (urinary tract infection)   . Anxiety   . Breast lesion     benign  . Hypothyroid   . HTN (hypertension)   . Elevated transaminase level     ? fatty liver   . Diabetes mellitus without complication [facility])    Past Surgical History  Procedure Laterality Date  . Breast biopsy      benign cyst  2011  . Cesarean section    . Laparoscopic total hysterectomy  11/2012   Social History  Substance Use Topics  . Smoking status: Former Smoker    Quit date: 07/22/1979  . Smokeless tobacco: Never Used     Comment: briefly smoked at 16  . Alcohol Use: No     Comment: rarely   Family History  Problem Relation Age of Onset  . Hypothyroidism Mother   . Alzheimer's disease Mother   . Stroke Father   . Deep vein thrombosis Sister   . Colon cancer Neg Hx   . Esophageal cancer Neg Hx   . Stomach cancer Neg Hx   . Rectal cancer Neg Hx    No Known Allergies Current Outpatient Prescriptions on File Prior to Visit  Medication Sig Dispense Refill  . levothyroxine (SYNTHROID, LEVOTHROID) 125 MCG tablet Take 1 tablet (125 mcg total) by mouth daily. 90 tablet 3  . losartan (COZAAR) 50 MG tablet Take 1 tablet (50 mg total) by mouth daily. 30  tablet 11  . metFORMIN (GLUCOPHAGE) 1000 MG tablet Take 1 tablet (1,000 mg total) by mouth 2 (two) times daily with a meal. 60 tablet 11   No current facility-administered medications on file prior to visit.     Review of Systems Review of Systems  Constitutional: Negative for fever, appetite change, fatigue and unexpected weight change.  Eyes: Negative for pain and visual disturbance.  Respiratory: Negative for cough and shortness of breath.   Cardiovascular: Negative for cp or palpitations    Gastrointestinal: Negative for nausea, diarrhea and constipation.  Genitourinary: Negative for urgency and frequency.  Skin: Negative for pallor or rash   Neurological: Negative for weakness, light-headedness, numbness and headaches.  Hematological: Negative for adenopathy. Does not bruise/bleed easily.  Psychiatric/Behavioral: pos for dysphoric mood and anxiety and stressors  neg for SI       Objective:   Physical Exam  Constitutional: She appears well-developed and well-nourished. No distress.  Morbidly obese and well appearing but tearful today  HENT:  Head: Normocephalic and atraumatic.  Mouth/Throat: Oropharynx is clear and moist.  Eyes: Conjunctivae and EOM are normal. Pupils are equal, round, and reactive to light.  Neck: Normal range of motion. Neck supple. No JVD present. Carotid bruit is not present. No thyromegaly present.  Cardiovascular: Normal rate, regular rhythm, normal heart sounds and intact distal pulses.  Exam reveals no gallop.   Pulmonary/Chest: Effort normal and breath sounds normal. No respiratory distress. She has no wheezes. She has no rales.  No crackles  Abdominal: Soft. Bowel sounds are normal. She exhibits no distension, no abdominal bruit and no mass. There is no tenderness.  Musculoskeletal: She exhibits no edema.  Lymphadenopathy:    She has no cervical adenopathy.  Neurological: She is alert. She has normal reflexes.  Skin: Skin is warm and dry. No rash  noted.  Psychiatric: Her speech is normal and behavior is normal. Thought content normal. Her mood appears anxious. Her affect is not blunt, not labile and not inappropriate. Thought content is not paranoid. She exhibits a depressed mood. She expresses no homicidal and no suicidal ideation.  Tearful when discussing work situation           Assessment & Plan:   Problem List Items Addressed This Visit      Cardiovascular and Mediastinum   Essential hypertension - Primary    bp in fair control at this time  BP Readings from Last 1 Encounters:  09/12/15 110/70  No changes needed Disc lifstyle change with low sodium diet and exercise  Labs reviewed Wt loss enc         Endocrine   DM type 2 (diabetes mellitus, type 2) ([facility])    Lab Results  Component Value Date   HGBA1C 7.3* 08/10/2015   Not optimal control Rev lifestyle changes and habits  Add glipizide 2.5 mg daily xl -- disc risk of hypoglycemia -will watch her readings closely  F/u 3 mo       Relevant Medications   glipiZIDE (GLUCOTROL XL) 2.5 MG 24 hr tablet     Other   Morbid obesity ([place])    Discussed how this problem influences overall health and the risks it imposes  Reviewed plan for weight loss with lower calorie diet (via better food choices and also portion control or program like weight watchers) and exercise building up to or more than 30 minutes 5 days per week including some aerobic activity        Relevant Medications   glipiZIDE (GLUCOTROL XL) 2.5 MG 24 hr tablet   Stress reaction    With emotional symptoms  Is having a hard time keeping up at work and thinks she is being discriminated against  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

## 2015-09-13 NOTE — Assessment & Plan Note (Signed)
Discussed how this problem influences overall health and the risks it imposes  Reviewed plan for weight loss with lower calorie diet (via better food choices and also portion control or program like weight watchers) and exercise building up to or more than 30 minutes 5 days per week including some aerobic activity    

## 2015-09-13 NOTE — Assessment & Plan Note (Signed)
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

## 2015-09-13 NOTE — Assessment & Plan Note (Signed)
bp in fair control at this time  BP Readings from Last 1 Encounters:  09/12/15 110/70   No changes needed Disc lifstyle change with low sodium diet and exercise  Labs reviewed Wt loss enc

## 2015-09-13 NOTE — Assessment & Plan Note (Signed)
Lab Results  Component Value Date   HGBA1C 7.3* 08/10/2015   Not optimal control Rev lifestyle changes and habits  Add glipizide 2.5 mg daily xl -- disc risk of hypoglycemia -will watch her readings closely  F/u 3 mo

## 2015-09-19 ENCOUNTER — Telehealth: Payer: Self-pay | Admitting: *Deleted

## 2015-09-20 NOTE — Telephone Encounter (Signed)
Did a PA online for pt's Glipizide Er and it advise me that the medication it's covered at all under pt's insurance even after doing a PA, please advise

## 2015-09-20 NOTE — Telephone Encounter (Signed)
That is too bad that it is not covered - please see if she can find out of glipizide short acting (non er) is covered  Neither med should be expensive  Thanks

## 2015-09-21 NOTE — Telephone Encounter (Signed)
Pt left v/m requesting status of prior auth.

## 2015-09-21 NOTE — Telephone Encounter (Signed)
Pt notified of denial of PA, she will check with insurance and see if glipizide is covered and call us back to let us know

## 2015-09-26 NOTE — Telephone Encounter (Signed)
Pt left v/m that insurance will not cover glipizide. Pt request cb.

## 2015-09-27 NOTE — Telephone Encounter (Signed)
Pt notified of Dr. Marliss Coots comments and verbalized understanding. Pt will check with pharmacy about cost of glipizide and also get a list of covered DM meds from her insurance and let us know

## 2015-09-27 NOTE — Telephone Encounter (Signed)
Please have her call her ins and get a list of covered DM drugs and also let me know what copay she can afford Glipizide tends to be about the cheapest thing-which is puzzling She may want to find out the price of short acting glipizide as well to see if that is affordable out of pocket

## 2015-09-28 MED ORDER — GLIMEPIRIDE 2 MG PO TABS: 2.0000 mg | ORAL_TABLET | Freq: Every day | ORAL | Status: DC

## 2015-09-28 NOTE — Telephone Encounter (Signed)
Left voicemail letting pt know Rx sent and if this isn't covered to call back and request supervisor to get a list of DM meds

## 2015-09-28 NOTE — Addendum Note (Signed)
Addended by: Loura Pardon A on: 09/28/2015 11:28 AM   Modules accepted: Orders, Medications

## 2015-09-28 NOTE — Telephone Encounter (Signed)
Spoke to Taylor Grimes who states that she has spoken to her insurance company and they are not able to provide her with a list of covered medications. Taylor Grimes states that Dr Glori Bickers will have to just send in the medication closest to glipizide and after the pharmacy runs it, they will be able to determine coverage.

## 2015-09-28 NOTE — Telephone Encounter (Signed)
Her insurance co should give her that info-that is un acceptable  She should ask to talk to a supervisor with her ins  I will try sending in amaryl -let's see how that goes

## 2015-10-15 ENCOUNTER — Other Ambulatory Visit: Payer: Self-pay | Admitting: *Deleted

## 2015-10-15 MED ORDER — LEVOTHYROXINE SODIUM 125 MCG PO TABS: 125.0000 ug | ORAL_TABLET | Freq: Every day | ORAL | Status: DC

## 2015-10-31 ENCOUNTER — Other Ambulatory Visit: Payer: Self-pay | Admitting: Family Medicine

## 2015-11-15 ENCOUNTER — Other Ambulatory Visit: Payer: Self-pay | Admitting: *Deleted

## 2015-11-15 MED ORDER — LEVOTHYROXINE SODIUM 125 MCG PO TABS: 125.0000 ug | ORAL_TABLET | Freq: Every day | ORAL | Status: DC

## 2015-11-30 ENCOUNTER — Other Ambulatory Visit: Payer: Self-pay | Admitting: Family Medicine

## 2015-12-07 ENCOUNTER — Other Ambulatory Visit (INDEPENDENT_AMBULATORY_CARE_PROVIDER_SITE_OTHER): Payer: PRIVATE HEALTH INSURANCE

## 2015-12-07 DIAGNOSIS — E119 Type 2 diabetes mellitus without complications: Secondary | ICD-10-CM

## 2015-12-07 LAB — HM DIABETES EYE EXAM

## 2015-12-07 LAB — HEMOGLOBIN A1C: Hgb A1c MFr Bld: 6.1 % (ref 4.6–6.5)

## 2015-12-14 ENCOUNTER — Ambulatory Visit (INDEPENDENT_AMBULATORY_CARE_PROVIDER_SITE_OTHER): Payer: PRIVATE HEALTH INSURANCE | Admitting: Family Medicine

## 2015-12-14 ENCOUNTER — Encounter: Payer: Self-pay | Admitting: Family Medicine

## 2015-12-14 VITALS — BP 118/78 | HR 68 | Temp 97.9°F | Ht 61.25 in | Wt 217.5 lb

## 2015-12-14 DIAGNOSIS — R350 Frequency of micturition: Secondary | ICD-10-CM | POA: Diagnosis not present

## 2015-12-14 DIAGNOSIS — I10 Essential (primary) hypertension: Secondary | ICD-10-CM

## 2015-12-14 DIAGNOSIS — E119 Type 2 diabetes mellitus without complications: Secondary | ICD-10-CM | POA: Diagnosis not present

## 2015-12-14 DIAGNOSIS — N3001 Acute cystitis with hematuria: Secondary | ICD-10-CM

## 2015-12-14 LAB — POC URINALSYSI DIPSTICK (AUTOMATED)
Bilirubin, UA: NEGATIVE
Blood, UA: NEGATIVE
Glucose, UA: NEGATIVE
Nitrite, UA: NEGATIVE
Protein, UA: 15
Spec Grav, UA: >=1.03
Urobilinogen, UA: 0.2
pH, UA: 6

## 2015-12-14 MED ORDER — METFORMIN HCL 1000 MG PO TABS: 1000.0000 mg | ORAL_TABLET | Freq: Two times a day (BID) | ORAL | Status: DC

## 2015-12-14 MED ORDER — LOSARTAN POTASSIUM 50 MG PO TABS: ORAL_TABLET | ORAL | Status: DC

## 2015-12-14 MED ORDER — SULFAMETHOXAZOLE-TRIMETHOPRIM 800-160 MG PO TABS: 1.0000 | ORAL_TABLET | Freq: Two times a day (BID) | ORAL | Status: DC

## 2015-12-14 NOTE — Assessment & Plan Note (Signed)
Pos ua with symptoms times 2 d tx with bactrim 5 d  Pending culture  Update if not starting to improve in a week or if worsening

## 2015-12-14 NOTE — Assessment & Plan Note (Signed)
Improved! Lab Results  Component Value Date   HGBA1C 6.1 12/07/2015   amaryl and metformin-tol well  opth utd  F/u 6 mo  Enc wt loss and low glycemic diet

## 2015-12-14 NOTE — Progress Notes (Signed)
Subjective:    Patient ID: Taylor Grimes, female    DOB: 26-Jan-1961, 55 y.o.   MRN: [id number]  HPI Here for f/u of chronic medical problems and also uti symptoms   ua is pos Urgency and frequency for 2 d  Low back is bothering her  No blood in urine  No dysuria  Results for orders placed or performed in visit on 12/14/15  POCT Urinalysis Dipstick (Automated)  Result Value Ref Range   Color, UA Yellow    Clarity, UA Cloudy    Glucose, UA Negative    Bilirubin, UA Negative    Ketones, UA Small    Spec Grav, UA >=1.030    Blood, UA Negative    pH, UA 6.0    Protein, UA 15 mg/dL    Urobilinogen, UA 0.2    Nitrite, UA Negative    Leukocytes, UA moderate (2+) (A) Negative     Wt is up 3 lb with bmi of 40.7  Morbidly obese  bp is stable today  No cp or palpitations or headaches or edema  No side effects to medicines  BP Readings from Last 3 Encounters:  12/14/15 118/78  09/12/15 110/70  08/10/15 125/80      Diabetes Home sugar results -lowest once was 85 Eating regularly  No high readings  DM diet = also eating bettter  Exercise - at work she has physical job with long shifts  Symptoms-none  A1C last  Lab Results  Component Value Date   HGBA1C 6.1 12/07/2015  this is down from 7.3  No problems with medications = metformin and amaryl  Renal protection Last eye exam -last week    Has thyroid specialist appt in June   Patient Active Problem List   Diagnosis Date Noted  . Stress reaction 09/12/2015  . Back pain, thoracic 01/23/2015  . Right shoulder pain 12/20/2014  . Epistaxis 11/21/2014  . Low HDL (under 40) 10/24/2014  . Routine general medical examination at a health care facility 03/08/2014  . UTI (urinary tract infection) 12/05/2013  . Malignant neoplasm of corpus uteri ([place]) 12/15/2012  . Post-menopausal bleeding 11/01/2012  . Colon cancer screening 11/01/2012  . DM type 2 (diabetes mellitus, type 2) ([place]) 06/30/2012  . Varicose veins of leg  with swelling, left 11/18/2011  . Morbid obesity ([place]) 11/12/2010  . Essential hypertension 07/12/2009  . Hypothyroidism 05/16/2009  . TRANSAMINASES, SERUM, ELEVATED 05/16/2009   Past Medical History  Diagnosis Date  . UTI (urinary tract infection)   . Anxiety   . Breast lesion     benign  . Hypothyroid   . HTN (hypertension)   . Elevated transaminase level     ? fatty liver   . Diabetes mellitus without complication [facility])    Past Surgical History  Procedure Laterality Date  . Breast biopsy      benign cyst 2011  . Cesarean section    . Laparoscopic total hysterectomy  11/2012   Social History  Substance Use Topics  . Smoking status: Former Smoker    Quit date: 07/22/1979  . Smokeless tobacco: Never Used     Comment: briefly smoked at 16  . Alcohol Use: No     Comment: rarely   Family History  Problem Relation Age of Onset  . Hypothyroidism Mother   . Alzheimer's disease Mother   . Stroke Father   . Deep vein thrombosis Sister   . Colon cancer Neg Hx   . Esophageal cancer Neg  Hx   . Stomach cancer Neg Hx   . Rectal cancer Neg Hx    No Known Allergies Current Outpatient Prescriptions on File Prior to Visit  Medication Sig Dispense Refill  . glimepiride (AMARYL) 2 MG tablet Take 1 tablet (2 mg total) by mouth daily before breakfast. 30 tablet 11  . levothyroxine (SYNTHROID, LEVOTHROID) 125 MCG tablet Take 1 tablet (125 mcg total) by mouth daily. **LAST REFILL**PT NEEDS APPT WITH DR GHERGHE**LAST REFILL** 90 tablet 0   No current facility-administered medications on file prior to visit.     Review of Systems Review of Systems  Constitutional: Negative for fever, appetite change, fatigue and unexpected weight change.  Eyes: Negative for pain and visual disturbance.  Respiratory: Negative for cough and shortness of breath.   Cardiovascular: Negative for cp or palpitations    Gastrointestinal: Negative for nausea, diarrhea and constipation.  Genitourinary: pos  for urgency and frequency. neg for flank pain or hematuria  Skin: Negative for pallor or rash   Neurological: Negative for weakness, light-headedness, numbness and headaches.  Hematological: Negative for adenopathy. Does not bruise/bleed easily.  Psychiatric/Behavioral: Negative for dysphoric mood. The patient is not nervous/anxious.         Objective:   Physical Exam  Constitutional: She appears well-developed and well-nourished. No distress.  Morbidly obese and well app  HENT:  Head: Normocephalic and atraumatic.  Mouth/Throat: Oropharynx is clear and moist.  Eyes: Conjunctivae and EOM are normal. Pupils are equal, round, and reactive to light.  Neck: Normal range of motion. Neck supple. No JVD present. Carotid bruit is not present. No thyromegaly present.  Cardiovascular: Normal rate, regular rhythm, normal heart sounds and intact distal pulses.  Exam reveals no gallop.   Pulmonary/Chest: Effort normal and breath sounds normal. No respiratory distress. She has no wheezes. She has no rales.  No crackles  Abdominal: Soft. Bowel sounds are normal. She exhibits no distension, no abdominal bruit and no mass. There is tenderness. There is no rebound.  No cva tenderness  Mild suprapubic tenderness  Musculoskeletal: She exhibits no edema.  Lymphadenopathy:    She has no cervical adenopathy.  Neurological: She is alert. She has normal reflexes.  Skin: Skin is warm and dry. No rash noted.  Psychiatric: She has a normal mood and affect.          Assessment & Plan:   Problem List Items Addressed This Visit      Cardiovascular and Mediastinum   Essential hypertension    bp in fair control at this time  BP Readings from Last 1 Encounters:  12/14/15 118/78   No changes needed Disc lifstyle change with low sodium diet and exercise  Labs reviewed  Enc wt loss       Relevant Medications   losartan (COZAAR) 50 MG tablet     Endocrine   DM type 2 (diabetes mellitus, type 2)  ([place])    Improved! Lab Results  Component Value Date   HGBA1C 6.1 12/07/2015   amaryl and metformin-tol well  opth utd  F/u 6 mo  Enc wt loss and low glycemic diet       Relevant Medications   losartan (COZAAR) 50 MG tablet   metFORMIN (GLUCOPHAGE) 1000 MG tablet     Genitourinary   UTI (urinary tract infection)    Pos ua with symptoms times 2 d tx with bactrim 5 d  Pending culture  Update if not starting to improve in a week or if worsening  Relevant Medications   sulfamethoxazole-trimethoprim (BACTRIM DS,SEPTRA DS) 800-160 MG tablet     Other   Morbid obesity ([place])    Discussed how this problem influences overall health and the risks it imposes  Reviewed plan for weight loss with lower calorie diet (via better food choices and also portion control or program like weight watchers) and exercise building up to or more than 30 minutes 5 days per week including some aerobic activity   Doing better with DM now       Relevant Medications   metFORMIN (GLUCOPHAGE) 1000 MG tablet    Other Visit Diagnoses    Urinary frequency    -  Primary    Relevant Orders    POCT Urinalysis Dipstick (Automated) (Completed)    Urine culture

## 2015-12-14 NOTE — Patient Instructions (Addendum)
Drink lots of fluids for your uti Take the bactrim as directed We will culture your urine to make sure this works well  Diabetes is much improved --good JOB! Follow up in 6 months for annual exam with labs prior

## 2015-12-14 NOTE — Assessment & Plan Note (Signed)
Discussed how this problem influences overall health and the risks it imposes  Reviewed plan for weight loss with lower calorie diet (via better food choices and also portion control or program like weight watchers) and exercise building up to or more than 30 minutes 5 days per week including some aerobic activity   Doing better with DM now

## 2015-12-14 NOTE — Progress Notes (Signed)
Pre visit review using our clinic review tool, if applicable. No additional management support is needed unless otherwise documented below in the visit note. 

## 2015-12-14 NOTE — Assessment & Plan Note (Signed)
bp in fair control at this time  BP Readings from Last 1 Encounters:  12/14/15 118/78   No changes needed Disc lifstyle change with low sodium diet and exercise  Labs reviewed  Enc wt loss

## 2015-12-17 LAB — URINE CULTURE: Colony Count: >=100000

## 2015-12-18 ENCOUNTER — Telehealth: Payer: Self-pay | Admitting: *Deleted

## 2015-12-18 MED ORDER — LEVOTHYROXINE SODIUM 125 MCG PO TABS: 125.0000 ug | ORAL_TABLET | Freq: Every day | ORAL | Status: DC

## 2015-12-18 NOTE — Telephone Encounter (Signed)
Let's refill the same dose for 1 more month as she has an appointment with me at the end of June.

## 2015-12-18 NOTE — Telephone Encounter (Signed)
Received a fax requesting a refill for levothyroxine. Last labs and OV was 09/28/2014. Please advise.

## 2016-01-14 ENCOUNTER — Encounter: Payer: Self-pay | Admitting: Internal Medicine

## 2016-01-14 ENCOUNTER — Ambulatory Visit (INDEPENDENT_AMBULATORY_CARE_PROVIDER_SITE_OTHER): Payer: PRIVATE HEALTH INSURANCE | Admitting: Internal Medicine

## 2016-01-14 VITALS — BP 124/74 | HR 68 | Ht 62.5 in | Wt 219.0 lb

## 2016-01-14 DIAGNOSIS — E039 Hypothyroidism, unspecified: Secondary | ICD-10-CM | POA: Diagnosis not present

## 2016-01-14 LAB — T4, FREE: Free T4: 1.44 ng/dL (ref 0.60–1.60)

## 2016-01-14 LAB — TSH: TSH: 1.5 u[IU]/mL (ref 0.35–4.50)

## 2016-01-14 MED ORDER — LEVOTHYROXINE SODIUM 125 MCG PO TABS: 125.0000 ug | ORAL_TABLET | Freq: Every day | ORAL | Status: DC

## 2016-01-14 NOTE — Progress Notes (Signed)
Patient ID: Taylor Grimes, female   DOB: 08-26-1960, 55 y.o.   MRN: [id number]   HPI  Taylor Grimes is a 55 y.o.-year-old female, returning for f/u for hypothyroidism. Last visit 1 year and 3 mo ago.  Pt. has been dx with hypothyroidism in 2012; is on generic Levothyroxine 125 mcg (increased 04/2014): - fasting - with water - separated by 1-1.5 hours min from b'fast  - no calcium, iron, PPIs, multivitamins   I reviewed pt's thyroid tests:  Lab Results  Component Value Date   TSH 1.68 09/28/2014   TSH 4.90* 05/04/2014   TSH 4.60* 03/09/2014   TSH 7.25* 12/29/2013   TSH 9.07* 11/03/2013   TSH 0.75 09/29/2013   TSH 0.14* 08/18/2013   TSH 0.06* 08/02/2013   TSH 0.04* 07/11/2013   TSH 0.07* 06/09/2013   FREET4 1.60 09/28/2014   FREET4 1.14 05/04/2014   FREET4 1.05 03/09/2014   FREET4 1.14 09/29/2013   FREET4 1.03 08/18/2013   FREET4 0.87 11/12/2010   FREET4 0.73 09/23/2010   FREET4 0.90 07/29/2010   FREET4 0.92 03/06/2010   FREET4 1.1 06/28/2009    Pt denies feeling nodules in neck, hoarseness, dysphagia/odynophagia, SOB with lying down. She does have a recent cough.  Pt feels well - no complaints: - no heat/cold intolerance - no weight gain - no fatigue - no constipation - no dry skin - no hair falling - no depression or anxiety - no palpitations - + tremor in left hand (hereditary)  She still wakes up at 3 am to go to work and open [employer] at 5 am.   I reviewed pt's medications, allergies, PMH, social hx, family hx, and changes were documented in the history of present illness. Otherwise, unchanged from my initial visit note.  ROS: Constitutional: no weight gain/loss, no fatigue, no subjective hyperthermia/hypothermia Eyes: no blurry vision, no xerophthalmia ENT: no sore throat, no nodules palpated in throat, no dysphagia/odynophagia, no hoarseness Cardiovascular: no CP/SOB/palpitations/+ hand and leg swelling Respiratory: no cough/SOB Gastrointestinal: no  N/V/D/C Musculoskeletal: no muscle/joint aches Skin: no rashes Neurological: + L>R (hereditary) tremors/no numbness/tingling/dizziness  PE: BP 124/74 mmHg  Pulse 68  Ht 5' 2.5" (1.588 m)  Wt 219 lb (99.338 kg)  BMI 39.39 kg/m2  SpO2 97%  LMP 07/21/2008 Wt Readings from Last 3 Encounters:  01/14/16 219 lb (99.338 kg)  12/14/15 217 lb 8 oz (98.657 kg)  09/12/15 214 lb 1.9 oz (97.124 kg)   Constitutional: overweight, in NAD Eyes: PERRLA, EOMI, no exophthalmos ENT: moist mucous membranes, no thyromegaly, no cervical lymphadenopathy Cardiovascular: RRR, No MRG Respiratory: CTA B Gastrointestinal: abdomen soft, NT, ND, BS+ Musculoskeletal: no deformities, strength intact in all 4 Skin: moist, warm, no rashes Neurological:DTR normal in all 4  ASSESSMENT: 1. Hypothyroidism  PLAN:  1. Patient with h/o hypothyroidism, previously over-replaced with levothyroxine therapy. She is now on Levothyroxine 125 mcg. Last TFTs are from > 1 year ago. - Will check the TFTs today.  - needs a refill of LT4 when labs are back - We again discussed about correct intake of levothyroxine, fasting, with water, separated by at least 30 minutes from breakfast, and separated by more than 4 hours from calcium, iron, multivitamins, acid reflux medications (PPIs). She is taking it correctly. - She does not appear to have a goiter, thyroid nodules, or neck compression symptoms - I will see her back in 1 year.  Needs refills.  Office Visit on 01/14/2016  Component Date Value Ref Range Status  .  TSH 01/14/2016 1.50  0.35 - 4.50 uIU/mL Final  . Free T4 01/14/2016 1.44  0.60 - 1.60 ng/dL Final   Labs are great!

## 2016-01-14 NOTE — Patient Instructions (Signed)
Please stop at the lab.  Please continue Levothyroxine 125 mcg daily.   Take the thyroid hormone every day, with water, at least 30 minutes before breakfast, separated by at least 4 hours from: - acid reflux medications - calcium - iron - multivitamins  Please return in 1 year. 

## 2016-04-10 ENCOUNTER — Other Ambulatory Visit: Payer: Self-pay | Admitting: Family Medicine

## 2016-04-10 DIAGNOSIS — Z1231 Encounter for screening mammogram for malignant neoplasm of breast: Secondary | ICD-10-CM

## 2016-05-13 ENCOUNTER — Ambulatory Visit (INDEPENDENT_AMBULATORY_CARE_PROVIDER_SITE_OTHER): Payer: PRIVATE HEALTH INSURANCE

## 2016-05-13 DIAGNOSIS — Z23 Encounter for immunization: Secondary | ICD-10-CM

## 2016-05-22 ENCOUNTER — Ambulatory Visit
Admission: RE | Admit: 2016-05-22 | Discharge: 2016-05-22 | Disposition: A | Discharge status: Home / Self Care | Payer: PRIVATE HEALTH INSURANCE | Source: Ambulatory Visit | Attending: Family Medicine | Admitting: Family Medicine

## 2016-05-22 DIAGNOSIS — Z1231 Encounter for screening mammogram for malignant neoplasm of breast: Secondary | ICD-10-CM

## 2016-06-08 ENCOUNTER — Telehealth: Payer: Self-pay | Admitting: Family Medicine

## 2016-06-08 DIAGNOSIS — E119 Type 2 diabetes mellitus without complications: Secondary | ICD-10-CM

## 2016-06-08 DIAGNOSIS — Z0000 Encounter for general adult medical examination without abnormal findings: Secondary | ICD-10-CM

## 2016-06-08 NOTE — Telephone Encounter (Signed)
-----   Message from Ellamae Sia sent at 06/04/2016 10:38 AM EST ----- Regarding: Lab orders for Tuesdday, 11.21.17 Patient is scheduled for CPX labs, please order future labs, Thanks , Karna Christmas

## 2016-06-10 ENCOUNTER — Other Ambulatory Visit: Payer: PRIVATE HEALTH INSURANCE

## 2016-06-18 ENCOUNTER — Ambulatory Visit (INDEPENDENT_AMBULATORY_CARE_PROVIDER_SITE_OTHER): Payer: PRIVATE HEALTH INSURANCE | Admitting: Family Medicine

## 2016-06-18 ENCOUNTER — Encounter: Payer: Self-pay | Admitting: Family Medicine

## 2016-06-18 VITALS — BP 124/84 | HR 74 | Temp 98.5°F | Ht 61.5 in | Wt 221.0 lb

## 2016-06-18 DIAGNOSIS — I10 Essential (primary) hypertension: Secondary | ICD-10-CM | POA: Diagnosis not present

## 2016-06-18 DIAGNOSIS — E119 Type 2 diabetes mellitus without complications: Secondary | ICD-10-CM | POA: Diagnosis not present

## 2016-06-18 DIAGNOSIS — Z0000 Encounter for general adult medical examination without abnormal findings: Secondary | ICD-10-CM | POA: Diagnosis not present

## 2016-06-18 DIAGNOSIS — E039 Hypothyroidism, unspecified: Secondary | ICD-10-CM | POA: Diagnosis not present

## 2016-06-18 DIAGNOSIS — Z8542 Personal history of malignant neoplasm of other parts of uterus: Secondary | ICD-10-CM

## 2016-06-18 DIAGNOSIS — E786 Lipoprotein deficiency: Secondary | ICD-10-CM

## 2016-06-18 MED ORDER — LEVOTHYROXINE SODIUM 125 MCG PO TABS: 125.0000 ug | ORAL_TABLET | Freq: Every day | ORAL | 11 refills | Status: DC

## 2016-06-18 MED ORDER — LOSARTAN POTASSIUM 50 MG PO TABS: ORAL_TABLET | ORAL | 11 refills | Status: DC

## 2016-06-18 MED ORDER — GLIMEPIRIDE 2 MG PO TABS: 2.0000 mg | ORAL_TABLET | Freq: Every day | ORAL | 11 refills | Status: DC

## 2016-06-18 MED ORDER — METFORMIN HCL 1000 MG PO TABS: 1000.0000 mg | ORAL_TABLET | Freq: Two times a day (BID) | ORAL | 11 refills | Status: DC

## 2016-06-18 NOTE — Progress Notes (Signed)
Subjective:    Patient ID: Taylor Grimes, female    DOB: 11/02/1960, 55 y.o.   MRN: [id number]  HPI  Here for health maintenance exam and to review chronic medical problems    A lot of stress at work / wishes she could find another job  Personal issues with her manager  Not treated well at work and other people don't carry their load   Wt Readings from Last 3 Encounters:  06/18/16 221 lb (100.2 kg)  01/14/16 219 lb (99.3 kg)  12/14/15 217 lb 8 oz (98.7 kg)  trying to eat healthy --staying away from [place] and sugar  Wt goes up and down  Cannot exercise due to exhaustion after work  bmi is 41.0 No sugar drinks - drinks water mostly   Hep C /HIV screen -declines/ not high risk   Pap 12/14- ascus with Dr Kennon Rounds Had a total hysterectomy in 5/14- complex atypical hyperplasia /endometrial  She is going to schedule a follow up (needs the phone number)    Eye exam 5/17-no retinopathy  Tetanus shot 10/08  Mammogram 11/17 nl  Self breast exam - no breast lumps   PNA vaccine 4/14  Colonoscopy 6/15- 5 year recall   Flu shot 10/17  bp is stable today  No cp or palpitations or headaches or edema  No side effects to medicines  BP Readings from Last 3 Encounters:  06/18/16 124/84  01/14/16 124/74  12/14/15 118/78      Hx of hypothyroidism  Lab Results  Component Value Date   TSH 1.50 01/14/2016     Hx of well controlled dm 3 Lab Results  Component Value Date   HGBA1C 6.1 12/07/2015  she hopes it will still be that good Is watching her diet/eating the same    Hx of low HDL Lab Results  Component Value Date   CHOL 179 08/10/2015   HDL 36.20 (L) 08/10/2015   LDLCALC 110 (H) 08/10/2015   TRIG 163.0 (H) 08/10/2015   CHOLHDL 5 08/10/2015   Due for labs today   Had a spell of dizziness last week In bed- spinning - nauseated  Feeling better now  No ear or viral symptoms currently  Patient Active Problem List   Diagnosis Date Noted  . Stress reaction  09/12/2015  . Back pain, thoracic 01/23/2015  . Right shoulder pain 12/20/2014  . Epistaxis 11/21/2014  . Low HDL (under 40) 10/24/2014  . Routine general medical examination at a health care facility 03/08/2014  . Malignant neoplasm of corpus uteri ([place]) 12/15/2012  . Post-menopausal bleeding 11/01/2012  . Colon cancer screening 11/01/2012  . DM type 2 (diabetes mellitus, type 2) ([place]) 06/30/2012  . Varicose veins of leg with swelling, left 11/18/2011  . Morbid obesity ([place]) 11/12/2010  . Essential hypertension 07/12/2009  . Hypothyroidism 05/16/2009  . TRANSAMINASES, SERUM, ELEVATED 05/16/2009   Past Medical History:  Diagnosis Date  . Anxiety   . Breast lesion    benign  . Diabetes mellitus without complication ([place])   . Elevated transaminase level    ? fatty liver   . HTN (hypertension)   . Hypothyroid   . UTI (urinary tract infection)    Past Surgical History:  Procedure Laterality Date  . BREAST BIOPSY     benign cyst 2011  . CESAREAN SECTION    . LAPAROSCOPIC TOTAL HYSTERECTOMY  11/2012   Social History  Substance Use Topics  . Smoking status: Former Audiological scientist  date: 07/22/1979  . Smokeless tobacco: Never Used     Comment: briefly smoked at 16  . Alcohol use No     Comment: rarely   Family History  Problem Relation Age of Onset  . Hypothyroidism Mother   . Alzheimer's disease Mother   . Stroke Father   . Deep vein thrombosis Sister   . Colon cancer Neg Hx   . Esophageal cancer Neg Hx   . Stomach cancer Neg Hx   . Rectal cancer Neg Hx    No Known Allergies No current outpatient prescriptions on file prior to visit.   No current facility-administered medications on file prior to visit.     Review of Systems Review of Systems  Constitutional: Negative for fever, appetite change, and unexpected weight change. pos for fatigue from work Eyes: Negative for pain and visual disturbance.  Respiratory: Negative for cough and shortness of breath.     Cardiovascular: Negative for cp or palpitations    Gastrointestinal: Negative for nausea, diarrhea and constipation.  Genitourinary: Negative for urgency and frequency.  Skin: Negative for pallor or rash   MSK pos for aches and pains  Neurological: Negative for weakness, light-headedness, numbness and headaches.  Hematological: Negative for adenopathy. Does not bruise/bleed easily.  Psychiatric/Behavioral: Negative for dysphoric mood. The patient is not nervous/anxious.  pos for significant stressors        Objective:   Physical Exam  Constitutional: She appears well-developed and well-nourished. No distress.  obese and well appearing   HENT:  Head: Normocephalic and atraumatic.  Right Ear: External ear normal.  Left Ear: External ear normal.  Nose: Nose normal.  Mouth/Throat: Oropharynx is clear and moist.  Eyes: Conjunctivae and EOM are normal. Pupils are equal, round, and reactive to light. Right eye exhibits no discharge. Left eye exhibits no discharge. No scleral icterus.  Neck: Normal range of motion. Neck supple. No JVD present. Carotid bruit is not present. No thyromegaly present.  Cardiovascular: Normal rate, regular rhythm, normal heart sounds and intact distal pulses.  Exam reveals no gallop.   LE varicosities  Pulmonary/Chest: Effort normal and breath sounds normal. No respiratory distress. She has no wheezes. She has no rales.  Abdominal: Soft. Bowel sounds are normal. She exhibits no distension and no mass. There is no tenderness.  Genitourinary:  Genitourinary Comments: Pt states gyn does her exam   Musculoskeletal: She exhibits no edema or tenderness.  Lymphadenopathy:    She has no cervical adenopathy.  Neurological: She is alert. She has normal reflexes. No cranial nerve deficit. She exhibits normal muscle tone. Coordination normal.  Skin: Skin is warm and dry. No rash noted. No erythema. No pallor.  Many SK and skin tags diffusely  Psychiatric: She has a normal  mood and affect.          Assessment & Plan:   Problem List Items Addressed This Visit      Cardiovascular and Mediastinum   Essential hypertension - Primary    bp in fair control at this time  BP Readings from Last 1 Encounters:  06/18/16 124/84   No changes needed Disc lifstyle change with low sodium diet and exercise  Labs today Wt loss enc      Relevant Medications   losartan (COZAAR) 50 MG tablet     Endocrine   Hypothyroidism    Hypothyroidism  Pt has no clinical changes No change in energy level/ hair or skin/ edema and no tremor TSH today  Relevant Medications   levothyroxine (SYNTHROID, LEVOTHROID) 125 MCG tablet   DM type 2 (diabetes mellitus, type 2) ([facility])    Due for A1C Was prev well controlled Wt loss and low glycemic diet enc  Pt states she cannot exercise due to work demands-enc her to try just 10 min daily if possible to start       Relevant Medications   glimepiride (AMARYL) 2 MG tablet   metFORMIN (GLUCOPHAGE) 1000 MG tablet   losartan (COZAAR) 50 MG tablet   Other Relevant Orders   Hemoglobin A1c     Genitourinary   History of uterine cancer    S/p hysterectomy She will schedule f/u with gyn        Other   Routine general medical examination at a health care facility    Reviewed health habits including diet and exercise and skin cancer prevention Reviewed appropriate screening tests for age  Also reviewed health mt list, fam hx and immunization status , as well as social and family history   See HPI Labs today Enc wt loss  utd health mt list  She will f/u with gyn for her annual visit         Relevant Orders   CBC with Differential/Platelet   Comprehensive metabolic panel   TSH   Lipid panel   Morbid obesity ([place])    Discussed how this problem influences overall health and the risks it imposes  Reviewed plan for weight loss with lower calorie diet (via better food choices and also portion control or program like  weight watchers) and exercise building up to or more than 30 minutes 5 days per week including some aerobic activity   This is challenging as pt is resistant to exercise  Knowledge base re: nutrition may not be good  Disc diet lower in processed foods       Relevant Medications   glimepiride (AMARYL) 2 MG tablet   metFORMIN (GLUCOPHAGE) 1000 MG tablet   Low HDL (under 40)    Disc goals for lipids and reasons to control them Rev labs with pt (last check) Rev low sat fat diet in detail Disc imp of exercise for HDL  Lab today

## 2016-06-18 NOTE — Patient Instructions (Addendum)
Start cutting portions by 1/4 at each meal Eat healthy -fruit/vegetables/lean protein and avoid processed food  Don't drink sugar drinks or juice  Keep drinking water with lemon   The phone number for Dr Virginia Crews office is [phone] Go ahead and make your appointment   Labs today

## 2016-06-19 LAB — COMPREHENSIVE METABOLIC PANEL WITH GFR
ALT: 20 U/L (ref 0–35)
AST: 22 U/L (ref 0–37)
Albumin: 3.8 g/dL (ref 3.5–5.2)
Alkaline Phosphatase: 88 U/L (ref 39–117)
BUN: 15 mg/dL (ref 6–23)
CO2: 30 meq/L (ref 19–32)
Calcium: 9.5 mg/dL (ref 8.4–10.5)
Chloride: 105 meq/L (ref 96–112)
Creatinine, Ser: 1.02 mg/dL (ref 0.40–1.20)
GFR: 59.29 mL/min — ABNORMAL LOW (ref >60.00)
Glucose, Bld: 67 mg/dL — ABNORMAL LOW (ref 70–99)
Potassium: 4 meq/L (ref 3.5–5.1)
Sodium: 143 meq/L (ref 135–145)
Total Bilirubin: 0.6 mg/dL (ref 0.2–1.2)
Total Protein: 7.4 g/dL (ref 6.0–8.3)

## 2016-06-19 LAB — CBC WITH DIFFERENTIAL/PLATELET
Basophils Absolute: 0 K/uL (ref 0.0–0.1)
Basophils Relative: 0.4 % (ref 0.0–3.0)
Eosinophils Absolute: 0.3 K/uL (ref 0.0–0.7)
Eosinophils Relative: 3.8 % (ref 0.0–5.0)
HCT: 38.1 % (ref 36.0–46.0)
Hemoglobin: 12.7 g/dL (ref 12.0–15.0)
Lymphocytes Relative: 33.7 % (ref 12.0–46.0)
Lymphs Abs: 3.1 K/uL (ref 0.7–4.0)
MCHC: 33.2 g/dL (ref 30.0–36.0)
MCV: 88 fl (ref 78.0–100.0)
Monocytes Absolute: 0.7 K/uL (ref 0.1–1.0)
Monocytes Relative: 8.2 % (ref 3.0–12.0)
Neutro Abs: 4.9 K/uL (ref 1.4–7.7)
Neutrophils Relative %: 53.9 % (ref 43.0–77.0)
Platelets: 232 K/uL (ref 150.0–400.0)
RBC: 4.34 Mil/uL (ref 3.87–5.11)
RDW: 13.4 % (ref 11.5–15.5)
WBC: 9.1 K/uL (ref 4.0–10.5)

## 2016-06-19 LAB — TSH: TSH: 1.37 u[IU]/mL (ref 0.35–4.50)

## 2016-06-19 LAB — LIPID PANEL
Cholesterol: 161 mg/dL (ref 0–200)
HDL: 38.2 mg/dL — ABNORMAL LOW (ref >39.00)
LDL Cholesterol: 90 mg/dL (ref 0–99)
NonHDL: 123.12
Total CHOL/HDL Ratio: 4
Triglycerides: 164 mg/dL — ABNORMAL HIGH (ref 0.0–149.0)
VLDL: 32.8 mg/dL (ref 0.0–40.0)

## 2016-06-19 LAB — HEMOGLOBIN A1C: Hgb A1c MFr Bld: 5.7 % (ref 4.6–6.5)

## 2016-06-19 NOTE — Assessment & Plan Note (Signed)
Discussed how this problem influences overall health and the risks it imposes  Reviewed plan for weight loss with lower calorie diet (via better food choices and also portion control or program like weight watchers) and exercise building up to or more than 30 minutes 5 days per week including some aerobic activity   This is challenging as pt is resistant to exercise  Knowledge base re: nutrition may not be good  Disc diet lower in processed foods

## 2016-06-19 NOTE — Assessment & Plan Note (Signed)
Disc goals for lipids and reasons to control them Rev labs with pt (last check) Rev low sat fat diet in detail Disc imp of exercise for HDL  Lab today

## 2016-06-19 NOTE — Assessment & Plan Note (Signed)
Hypothyroidism  °Pt has no clinical changes °No change in energy level/ hair or skin/ edema and no tremor °TSH today ° °

## 2016-06-19 NOTE — Assessment & Plan Note (Signed)
Reviewed health habits including diet and exercise and skin cancer prevention Reviewed appropriate screening tests for age  Also reviewed health mt list, fam hx and immunization status , as well as social and family history   See HPI Labs today Enc wt loss  utd health mt list  She will f/u with gyn for her annual visit

## 2016-06-19 NOTE — Assessment & Plan Note (Signed)
Due for A1C Was prev well controlled Wt loss and low glycemic diet enc  Pt states she cannot exercise due to work demands-enc her to try just 10 min daily if possible to start

## 2016-06-19 NOTE — Assessment & Plan Note (Signed)
bp in fair control at this time  BP Readings from Last 1 Encounters:  06/18/16 124/84   No changes needed Disc lifstyle change with low sodium diet and exercise  Labs today Wt loss enc

## 2016-06-19 NOTE — Assessment & Plan Note (Signed)
S/p hysterectomy She will schedule f/u with gyn

## 2017-01-13 ENCOUNTER — Ambulatory Visit: Payer: PRIVATE HEALTH INSURANCE | Admitting: Internal Medicine

## 2017-01-26 ENCOUNTER — Ambulatory Visit: Payer: PRIVATE HEALTH INSURANCE | Admitting: Internal Medicine

## 2017-01-30 DIAGNOSIS — H524 Presbyopia: Secondary | ICD-10-CM | POA: Diagnosis not present

## 2017-02-24 ENCOUNTER — Encounter: Payer: Self-pay | Admitting: Internal Medicine

## 2017-02-24 ENCOUNTER — Ambulatory Visit (INDEPENDENT_AMBULATORY_CARE_PROVIDER_SITE_OTHER): Payer: BLUE CROSS/BLUE SHIELD | Admitting: Internal Medicine

## 2017-02-24 VITALS — BP 134/84 | HR 68 | Wt 227.0 lb

## 2017-02-24 DIAGNOSIS — E039 Hypothyroidism, unspecified: Secondary | ICD-10-CM

## 2017-02-24 LAB — T4, FREE: Free T4: 1.15 ng/dL (ref 0.60–1.60)

## 2017-02-24 LAB — TSH: TSH: 2.69 u[IU]/mL (ref 0.35–4.50)

## 2017-02-24 MED ORDER — LEVOTHYROXINE SODIUM 125 MCG PO TABS: 125.0000 ug | ORAL_TABLET | Freq: Every day | ORAL | 3 refills | Status: DC

## 2017-02-24 NOTE — Progress Notes (Addendum)
Patient ID: Taylor Grimes, female   DOB: 06-Sep-1960, 56 y.o.   MRN: [id number]   HPI  Taylor Grimes is a 56 y.o.-year-old female, returning for f/u for hypothyroidism, dx 2012. Last visit 1 year ago.  Pt is on levothyroxine 125 mcg daily (increased 04/2014), taken: - in am - fasting - at least 30 min from b'fast - no Ca, Fe, MVI, PPIs - not on Biotin  I reviewed pt's thyroid tests:  Lab Results  Component Value Date   TSH 1.37 06/18/2016   TSH 1.50 01/14/2016   TSH 1.68 09/28/2014   TSH 4.90 (H) 05/04/2014   TSH 4.60 (H) 03/09/2014   TSH 7.25 (H) 12/29/2013   TSH 9.07 (H) 11/03/2013   TSH 0.75 09/29/2013   TSH 0.14 (L) 08/18/2013   TSH 0.06 (L) 08/02/2013   FREET4 1.44 01/14/2016   FREET4 1.60 09/28/2014   FREET4 1.14 05/04/2014   FREET4 1.05 03/09/2014   FREET4 1.14 09/29/2013   FREET4 1.03 08/18/2013   FREET4 0.87 11/12/2010   FREET4 0.73 09/23/2010   FREET4 0.90 07/29/2010   FREET4 0.92 03/06/2010    Pt denies: - feeling nodules in neck - hoarseness - dysphagia - choking - SOB with lying down  She still wakes up at 3 am to go to work and open [employer] at 4-7 am.   ROS: Constitutional: + weight gain/no weight loss, no fatigue, no subjective hyperthermia, no subjective hypothermia Eyes: no blurry vision, + xerophthalmia ENT: no sore throat, + see HPI Cardiovascular: no CP/no SOB/no palpitations/no leg swelling Respiratory: no cough/no SOB/no wheezing Gastrointestinal: no N/no V/no D/no C/no acid reflux Musculoskeletal: no muscle aches/no joint aches Skin: no rashes, no hair loss Neurological: improved hereditary tremors/no numbness/no tingling/no dizziness  I reviewed pt's medications, allergies, PMH, social hx, family hx, and changes were documented in the history of present illness. Otherwise, unchanged from my initial visit note.  PE: BP 134/84 (BP Location: Left Arm, Patient Position: Sitting)   Pulse 68   Wt 227 lb (103 kg)   LMP 07/21/2008   SpO2  96%   BMI 42.20 kg/m  Wt Readings from Last 3 Encounters:  02/24/17 227 lb (103 kg)  06/18/16 221 lb (100.2 kg)  01/14/16 219 lb (99.3 kg)   Constitutional: overweight, in NAD Eyes: PERRLA, EOMI, no exophthalmos ENT: moist mucous membranes, no thyromegaly, no cervical lymphadenopathy Cardiovascular: RRR, No MRG Respiratory: CTA B Gastrointestinal: abdomen soft, NT, ND, BS+ Musculoskeletal: no deformities, strength intact in all 4 Skin: moist, warm, no rashes Neurological: no tremor with outstretched hands, DTR normal in all 4  ASSESSMENT: 1. Hypothyroidism  2. Obesity class 3  PLAN:  1. Patient with h/o hypothyroidism, previously over-replaced with levothyroxine therapy, but now with normal TFTs - latest thyroid labs reviewed with pt >> normal  - she continues on LT4 125 mcg daily - pt feels good on this dose. - we discussed about taking the thyroid hormone every day, with water, >30 minutes before breakfast, separated by >4 hours from acid reflux medications, calcium, iron, multivitamins. Pt. is taking it correctly - will check thyroid tests today: TSH and fT4 - If labs are abnormal, she will need to return for repeat TFTs in 1.5 months - OTW, RTC in 1 year  2. Obesity class 3 - worse, gained 8 lbs since last visit - she is having pbs with this as she works at [employer] - discussed the need to change her diet >> she started to make changes,  though >> drinking water with Lemon, for e.g.  Needs refills.  Office Visit on 02/24/2017  Component Date Value Ref Range Status  . TSH 02/24/2017 2.69  0.35 - 4.50 uIU/mL Final  . Free T4 02/24/2017 1.15  0.60 - 1.60 ng/dL Final   Comment: Specimens from patients who are undergoing biotin therapy and /or ingesting biotin supplements may contain high levels of biotin.  The higher biotin concentration in these specimens interferes with this Free T4 assay.  Specimens that contain high levels  of biotin may cause false high results for  this Free T4 assay.  Please interpret results in light of the total clinical presentation of the patient.     Thyroid tests are normal.  Philemon Kingdom, MD PhD [facility] Endocrinology

## 2017-02-24 NOTE — Patient Instructions (Signed)
Please stop at the lab.  Please continue Levothyroxine 125 mcg daily.   Take the thyroid hormone every day, with water, at least 30 minutes before breakfast, separated by at least 4 hours from: - acid reflux medications - calcium - iron - multivitamins  Please return in 1 year. 

## 2017-02-24 NOTE — Addendum Note (Signed)
Addended by: Philemon Kingdom on: 02/24/2017 05:55 PM   Modules accepted: Orders

## 2017-04-14 ENCOUNTER — Other Ambulatory Visit: Payer: Self-pay | Admitting: Family Medicine

## 2017-04-14 DIAGNOSIS — Z1231 Encounter for screening mammogram for malignant neoplasm of breast: Secondary | ICD-10-CM

## 2017-05-05 ENCOUNTER — Encounter: Payer: Self-pay | Admitting: Obstetrics & Gynecology

## 2017-05-05 ENCOUNTER — Ambulatory Visit (INDEPENDENT_AMBULATORY_CARE_PROVIDER_SITE_OTHER): Payer: BLUE CROSS/BLUE SHIELD | Admitting: Obstetrics & Gynecology

## 2017-05-05 VITALS — BP 137/80 | HR 86 | Wt 228.6 lb

## 2017-05-05 DIAGNOSIS — Z8542 Personal history of malignant neoplasm of other parts of uterus: Secondary | ICD-10-CM

## 2017-05-05 DIAGNOSIS — Z23 Encounter for immunization: Secondary | ICD-10-CM

## 2017-05-05 NOTE — Progress Notes (Signed)
GYNECOLOGY OFFICE VISIT NOTE  History:  56 y.o. G3P3 here today for follow up, has history of endometrial cancer s/p hysterectomy in 2014.  Has not been seen here since 06/2013. She denies any abnormal vaginal discharge, bleeding, pelvic pain, weight loss, lethargy or other concerns.   Past Medical History:  Diagnosis Date  . Anxiety   . Breast lesion    benign  . Diabetes mellitus without complication ([place])   . Elevated transaminase level    ? fatty liver   . 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  From SGO Guidelines:  Recommended surveillance after treatment of endometrial cancer includes a follow-up visit every 3-6 months for 2 years, then every 6 months for 3 years, and annually thereafter. Each follow-up visit should include a thorough patient history; elicitation and investigation of any new symptoms associated with recurrence, such as vaginal bleeding, pelvic pain, weight loss, or lethargy; and a thorough speculum, pelvic, and rectovaginal examination  . HTN (hypertension)   . Hypothyroid   . UTI (urinary tract infection)     Past Surgical History:  Procedure Laterality Date  . BREAST BIOPSY     benign cyst 2011  . CESAREAN SECTION    . LAPAROSCOPIC TOTAL HYSTERECTOMY  11/2012    The following portions of the patient's history were reviewed and updated as appropriate: allergies, current medications, past family history, past medical history, past social history, past surgical history and problem list.   Health Maintenance:  Normal mammogram on 05/22/2016   Review of Systems:  Pertinent items noted in HPI and remainder of comprehensive ROS otherwise negative.   Objective:  Physical Exam BP 137/80   Pulse 86   Wt 228 lb 9.6 oz (103.7 kg)   LMP 07/21/2008   BMI 42.49 kg/m  CONSTITUTIONAL: Well-developed, well-nourished female in no acute distress.  HENT:   Normocephalic, atraumatic. External right and left ear normal. Oropharynx is clear and moist EYES: Conjunctivae and EOM are normal. Pupils are equal, round, and reactive to light. No scleral icterus.  NECK: Normal range of motion, supple, no masses SKIN: Skin is warm and dry. No rash noted. Not diaphoretic. No erythema. No pallor. NEUROLOGIC: Alert and oriented to person, place, and time. Normal reflexes, muscle tone coordination. No cranial nerve deficit noted. PSYCHIATRIC: Normal mood and affect. Normal behavior. Normal judgment and thought content. CARDIOVASCULAR: Normal heart rate noted RESPIRATORY: Effort and breath sounds normal, no problems with respiration noted ABDOMEN: Soft, obese, no distention noted.   PELVIC: Normal appearing external genitalia; normal appearing vaginal mucosa and vaginal cuff.  No abnormal discharge noted.  No palpable masses, no tenderness. MUSCULOSKELETAL: Normal range of motion. No edema noted.   Assessment & Plan:  1. Need for influenza vaccination - Flu Vaccine QUAD 36+ mos IM (Fluarix, Quad PF)  2. History of endometrial cancer Normal exam today.  Emphasized need for yearly surveillance.  From SGO Guidelines:  Recommended surveillance after treatment of endometrial cancer includes a follow-up visit every 3-6 months for 2 years, then every 6 months for 3 years, and annually thereafter. Each follow-up visit should include a thorough patient history; elicitation and investigation of any new symptoms associated with recurrence, such as vaginal bleeding, pelvic pain, weight loss, or lethargy; and a thorough speculum, pelvic, and rectovaginal examination  Routine preventative health maintenance measures emphasized. Please refer to After Visit Summary for other counseling recommendations.   Total face-to-face time with  patient: 20 minutes. Over 50% of encounter was spent on counseling and coordination of care.   Verita Schneiders, MD, [place] Attending  [place], [facility] for [employer], [place]

## 2017-05-05 NOTE — Patient Instructions (Signed)
Return to clinic for any scheduled appointments or for any gynecologic concerns as needed.   

## 2017-05-05 NOTE — Progress Notes (Signed)
MAMMOGRAM APPT 05/25/2017

## 2017-05-25 ENCOUNTER — Ambulatory Visit
Admission: RE | Admit: 2017-05-25 | Discharge: 2017-05-25 | Disposition: A | Discharge status: Home / Self Care | Payer: BLUE CROSS/BLUE SHIELD | Source: Ambulatory Visit | Attending: Family Medicine | Admitting: Family Medicine

## 2017-05-25 DIAGNOSIS — Z1231 Encounter for screening mammogram for malignant neoplasm of breast: Secondary | ICD-10-CM

## 2017-06-17 ENCOUNTER — Ambulatory Visit (INDEPENDENT_AMBULATORY_CARE_PROVIDER_SITE_OTHER): Payer: BLUE CROSS/BLUE SHIELD | Admitting: Family Medicine

## 2017-06-17 ENCOUNTER — Encounter: Payer: Self-pay | Admitting: Family Medicine

## 2017-06-17 VITALS — BP 126/82 | HR 71 | Temp 98.2°F | Ht 61.25 in | Wt 227.2 lb

## 2017-06-17 DIAGNOSIS — E039 Hypothyroidism, unspecified: Secondary | ICD-10-CM | POA: Diagnosis not present

## 2017-06-17 DIAGNOSIS — E119 Type 2 diabetes mellitus without complications: Secondary | ICD-10-CM | POA: Diagnosis not present

## 2017-06-17 DIAGNOSIS — Z0000 Encounter for general adult medical examination without abnormal findings: Secondary | ICD-10-CM

## 2017-06-17 DIAGNOSIS — E786 Lipoprotein deficiency: Secondary | ICD-10-CM | POA: Diagnosis not present

## 2017-06-17 DIAGNOSIS — I10 Essential (primary) hypertension: Secondary | ICD-10-CM

## 2017-06-17 LAB — CBC WITH DIFFERENTIAL/PLATELET
Basophils Absolute: 0.1 10*3/uL (ref 0.0–0.1)
Basophils Relative: 0.7 % (ref 0.0–3.0)
Eosinophils Absolute: 0.4 10*3/uL (ref 0.0–0.7)
Eosinophils Relative: 4.4 % (ref 0.0–5.0)
HCT: 42.4 % (ref 36.0–46.0)
Hemoglobin: 13.8 g/dL (ref 12.0–15.0)
Lymphocytes Relative: 35.1 % (ref 12.0–46.0)
Lymphs Abs: 3 10*3/uL (ref 0.7–4.0)
MCHC: 32.5 g/dL (ref 30.0–36.0)
MCV: 90.8 fl (ref 78.0–100.0)
Monocytes Absolute: 0.6 10*3/uL (ref 0.1–1.0)
Monocytes Relative: 6.5 % (ref 3.0–12.0)
Neutro Abs: 4.6 10*3/uL (ref 1.4–7.7)
Neutrophils Relative %: 53.3 % (ref 43.0–77.0)
Platelets: 235 10*3/uL (ref 150.0–400.0)
RBC: 4.67 Mil/uL (ref 3.87–5.11)
RDW: 13.9 % (ref 11.5–15.5)
WBC: 8.6 10*3/uL (ref 4.0–10.5)

## 2017-06-17 LAB — COMPREHENSIVE METABOLIC PANEL
ALT: 26 U/L (ref 0–35)
AST: 27 U/L (ref 0–37)
Albumin: 3.9 g/dL (ref 3.5–5.2)
Alkaline Phosphatase: 103 U/L (ref 39–117)
BUN: 9 mg/dL (ref 6–23)
CO2: 33 mEq/L — ABNORMAL HIGH (ref 19–32)
Calcium: 9.7 mg/dL (ref 8.4–10.5)
Chloride: 103 mEq/L (ref 96–112)
Creatinine, Ser: 0.81 mg/dL (ref 0.40–1.20)
GFR: 77.08 mL/min (ref >60.00)
Glucose, Bld: 105 mg/dL — ABNORMAL HIGH (ref 70–99)
Potassium: 4.4 mEq/L (ref 3.5–5.1)
Sodium: 141 mEq/L (ref 135–145)
Total Bilirubin: 0.6 mg/dL (ref 0.2–1.2)
Total Protein: 7.8 g/dL (ref 6.0–8.3)

## 2017-06-17 LAB — LIPID PANEL
Cholesterol: 187 mg/dL (ref 0–200)
HDL: 37 mg/dL — ABNORMAL LOW (ref >39.00)
NonHDL: 150.34
Total CHOL/HDL Ratio: 5
Triglycerides: 208 mg/dL — ABNORMAL HIGH (ref 0.0–149.0)
VLDL: 41.6 mg/dL — ABNORMAL HIGH (ref 0.0–40.0)

## 2017-06-17 LAB — LDL CHOLESTEROL, DIRECT: Direct LDL: 113 mg/dL

## 2017-06-17 LAB — HEMOGLOBIN A1C: Hgb A1c MFr Bld: 6.5 % (ref 4.6–6.5)

## 2017-06-17 MED ORDER — GLIMEPIRIDE 2 MG PO TABS: 2.0000 mg | ORAL_TABLET | Freq: Every day | ORAL | 11 refills | Status: DC

## 2017-06-17 MED ORDER — LOSARTAN POTASSIUM 50 MG PO TABS: ORAL_TABLET | ORAL | 11 refills | Status: DC

## 2017-06-17 MED ORDER — METFORMIN HCL 1000 MG PO TABS: 1000.0000 mg | ORAL_TABLET | Freq: Two times a day (BID) | ORAL | 11 refills | Status: DC

## 2017-06-17 NOTE — Assessment & Plan Note (Signed)
Discussed how this problem influences overall health and the risks it imposes  Reviewed plan for weight loss with lower calorie diet (via better food choices and also portion control or program like weight watchers) and exercise building up to or more than 30 minutes 5 days per week including some aerobic activity   Enc lower carb diet  Pack lunch so not eating [employer] food at work  Limited exercise since she works long shifts on her feet

## 2017-06-17 NOTE — Assessment & Plan Note (Signed)
bp in fair control at this time  BP Readings from Last 1 Encounters:  06/17/17 126/82   No changes needed Disc lifstyle change with low sodium diet and exercise  Labs today  Enc wt loss

## 2017-06-17 NOTE — Assessment & Plan Note (Signed)
Lab Results  Component Value Date   TSH 2.69 02/24/2017   Sees endocrinology  Doing well

## 2017-06-17 NOTE — Progress Notes (Signed)
Subjective:    Patient ID: Taylor Grimes, female    DOB: [birth year], 56 y.o.   MRN: [id number]  HPI Here for health maintenance exam and to review chronic medical problems    Wt Readings from Last 3 Encounters:  06/17/17 227 lb 4 oz (103.1 kg)  05/05/17 228 lb 9.6 oz (103.7 kg)  02/24/17 227 lb (103 kg)   42.59 kg/m  She is trying to cut back but having a rough time  Is working 8 hour shifts/ and may increase to 10 hour shifts (w/o breaks)  Dislikes her job -but cannot find another job  Lots of exercise at work  Really trying to lay off bread/carbs   Sees gyn-hx of endometrial tumor/hysterectomy- had her visit last month   Eye exam 3/18- was good -will send for that    Tetanus shot 10/09  Pneumonia vaccine -2014  Flu shot 10/18  Mammogram 11/18= normal  Self breast exam -no lumps Had exam from gyn   Colonoscopy 6/15 with 5 y recall   bp is stable today  No cp or palpitations or headaches or edema  No side effects to medicines  BP Readings from Last 3 Encounters:  06/17/17 126/82  05/05/17 137/80  02/24/17 134/84    Lab Results  Component Value Date   CREATININE 1.02 06/18/2016   BUN 15 06/18/2016   NA 143 06/18/2016   K 4.0 06/18/2016   CL 105 06/18/2016   CO2 30 06/18/2016   Due for labs    DM2 Lab Results  Component Value Date   HGBA1C 5.7 06/18/2016   Due for labs  No high or low sugar readings that she knows of  Does not skip meals   Hypothyroidism -sees Dr Cruzita Lederer Pt has no clinical changes No change in energy level/ hair or skin/ edema and no tremor Lab Results  Component Value Date   TSH 2.69 02/24/2017     Patient Active Problem List   Diagnosis Date Noted  . Stress reaction 09/12/2015  . Back pain, thoracic 01/23/2015  . Low HDL (under 40) 10/24/2014  . Routine general medical examination at a health care facility 03/08/2014  . History of endometrial cancer 12/15/2012  . Colon cancer screening 11/01/2012  . DM type 2 (diabetes  mellitus, type 2) ([place]) 06/30/2012  . Varicose veins of leg with swelling, left 11/18/2011  . Morbid obesity ([place]) 11/12/2010  . Essential hypertension 07/12/2009  . Hypothyroidism 05/16/2009  . TRANSAMINASES, SERUM, ELEVATED 05/16/2009   Past Medical History:  Diagnosis Date  . Anxiety   . Breast lesion    benign  . Diabetes mellitus without complication ([place])   . Elevated transaminase level    ? fatty liver   . 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  From SGO Guidelines:  Recommended surveillance after treatment of endometrial cancer includes a follow-up visit every 3-6 months for 2 years, then every 6 months for 3 years, and annually thereafter. Each follow-up visit should include a thorough patient history; elicitation and investigation of any new symptoms associated with recurrence, such as vaginal bleeding, pelvic pain, weight loss, or lethargy; and a thorough speculum, pelvic, and rectovaginal examination  . HTN (hypertension)   . Hypothyroid   . UTI (urinary tract infection)    Past Surgical History:  Procedure Laterality Date  . BREAST BIOPSY     benign cyst 2011  . CESAREAN SECTION    .  LAPAROSCOPIC TOTAL HYSTERECTOMY  11/2012   Social History   Tobacco Use  . Smoking status: Former Smoker    Last attempt to quit: 07/22/1979    Years since quitting: 37.9  . Smokeless tobacco: Never Used  . Tobacco comment: briefly smoked at 16  Substance Use Topics  . Alcohol use: No    Alcohol/week: 0.0 oz    Comment: rarely  . Drug use: No   Family History  Problem Relation Age of Onset  . Hypothyroidism Mother   . Alzheimer's disease Mother   . Stroke Father   . Deep vein thrombosis Sister   . Colon cancer Neg Hx   . Esophageal cancer Neg Hx   . Stomach cancer Neg Hx   . Rectal cancer Neg Hx    No Known Allergies Current Outpatient Medications on File Prior to Visit    Medication Sig Dispense Refill  . levothyroxine (SYNTHROID, LEVOTHROID) 125 MCG tablet Take 1 tablet (125 mcg total) by mouth daily. 90 tablet 3   No current facility-administered medications on file prior to visit.     Review of Systems  Constitutional: Negative for activity change, appetite change, fatigue, fever and unexpected weight change.  HENT: Negative for congestion, ear pain, rhinorrhea, sinus pressure and sore throat.   Eyes: Negative for pain, redness and visual disturbance.  Respiratory: Negative for cough, shortness of breath and wheezing.   Cardiovascular: Negative for chest pain and palpitations.       Pos for leg swelling with prolonged standing  Cannot work longer than 8 hours  Intolerant of supp hose  Gastrointestinal: Negative for abdominal pain, blood in stool, constipation and diarrhea.  Endocrine: Negative for polydipsia and polyuria.  Genitourinary: Negative for dysuria, frequency and urgency.  Musculoskeletal: Negative for arthralgias, back pain and myalgias.  Skin: Negative for pallor and rash.  Allergic/Immunologic: Negative for environmental allergies.  Neurological: Negative for dizziness, syncope and headaches.  Hematological: Negative for adenopathy. Does not bruise/bleed easily.  Psychiatric/Behavioral: Negative for decreased concentration and dysphoric mood. The patient is not nervous/anxious.        Objective:   Physical Exam  Constitutional: She appears well-developed and well-nourished. No distress.  Morbidly obese and well appearing   HENT:  Head: Normocephalic and atraumatic.  Right Ear: External ear normal.  Left Ear: External ear normal.  Nose: Nose normal.  Mouth/Throat: Oropharynx is clear and moist.  Bilateral partial cerumen imp  Eyes: Conjunctivae and EOM are normal. Pupils are equal, round, and reactive to light. Right eye exhibits no discharge. Left eye exhibits no discharge. No scleral icterus.  Neck: Normal range of motion. Neck  supple. No JVD present. Carotid bruit is not present. No thyromegaly present.  Cardiovascular: Normal rate, regular rhythm, normal heart sounds and intact distal pulses. Exam reveals no gallop.  Pulmonary/Chest: Effort normal and breath sounds normal. No respiratory distress. She has no wheezes. She has no rales.  No crackles  Abdominal: Soft. Bowel sounds are normal. She exhibits no distension, no abdominal bruit and no mass. There is no tenderness.  Musculoskeletal: She exhibits no edema or tenderness.  Lymphadenopathy:    She has no cervical adenopathy.  Neurological: She is alert. She has normal reflexes. No cranial nerve deficit. She exhibits normal muscle tone. Coordination normal.  Skin: Skin is warm and dry. No rash noted. No erythema. No pallor.  Many SKs and lentigines on trunk   Psychiatric: She has a normal mood and affect.  Assessment & Plan:   Problem List Items Addressed This Visit      Cardiovascular and Mediastinum   Essential hypertension - Primary    bp in fair control at this time  BP Readings from Last 1 Encounters:  06/17/17 126/82   No changes needed Disc lifstyle change with low sodium diet and exercise  Labs today  Enc wt loss       Relevant Medications   losartan (COZAAR) 50 MG tablet   Other Relevant Orders   CBC with Differential/Platelet   Comprehensive metabolic panel   Lipid panel     Endocrine   DM type 2 (diabetes mellitus, type 2) (HCC)    A1C today  Has been well controlled  Disc foot and eye care (will use lotion on feet and get nails trimmed)  Disc imp of wt loss  Sent for opthy report       Relevant Medications   metFORMIN (GLUCOPHAGE) 1000 MG tablet   losartan (COZAAR) 50 MG tablet   glimepiride (AMARYL) 2 MG tablet   Other Relevant Orders   Hemoglobin A1c   Hypothyroidism    Lab Results  Component Value Date   TSH 2.69 02/24/2017   Sees endocrinology  Doing well          Other   Low HDL (under 40)     Enc exercise  Job is physical-unsure she can do more  Lab today  Omega 3 disc      Relevant Orders   Lipid panel   Morbid obesity ([place])    Discussed how this problem influences overall health and the risks it imposes  Reviewed plan for weight loss with lower calorie diet (via better food choices and also portion control or program like weight watchers) and exercise building up to or more than 30 minutes 5 days per week including some aerobic activity   Enc lower carb diet  Pack lunch so not eating [employer] food at work  Limited exercise since she works long shifts on her feet      Relevant Medications   metFORMIN (GLUCOPHAGE) 1000 MG tablet   glimepiride (AMARYL) 2 MG tablet   Routine general medical examination at a health care facility    Reviewed health habits including diet and exercise and skin cancer prevention Reviewed appropriate screening tests for age  Also reviewed health mt list, fam hx and immunization status , as well as social and family history    See HPI Labs today for wellness and chronic conditions  Foot exam done [facility] care utd imms utd Medications refilled  Will send for eye exam report

## 2017-06-17 NOTE — Assessment & Plan Note (Signed)
A1C today  Has been well controlled  Disc foot and eye care (will use lotion on feet and get nails trimmed)  Disc imp of wt loss  Sent for opthy report

## 2017-06-17 NOTE — Assessment & Plan Note (Signed)
Reviewed health habits including diet and exercise and skin cancer prevention Reviewed appropriate screening tests for age  Also reviewed health mt list, fam hx and immunization status , as well as social and family history    See HPI Labs today for wellness and chronic conditions  Foot exam done [facility]yn care utd imms utd Medications refilled  Will send for eye exam report

## 2017-06-17 NOTE — Patient Instructions (Addendum)
Try to get most of your carbohydrates from produce (with the exception of white potatoes)  Eat less bread/pasta/rice/snack foods/cereals/sweets and other items from the middle of the grocery store (processed carbs)  Start packing a lunch for work    Labs today   Get extra exercise when you can

## 2017-06-17 NOTE — Assessment & Plan Note (Signed)
Enc exercise  Job is physical-unsure she can do more  Lab today  Omega 3 disc

## 2017-07-21 DIAGNOSIS — J988 Other specified respiratory disorders: Secondary | ICD-10-CM | POA: Diagnosis not present

## 2017-07-23 ENCOUNTER — Telehealth: Payer: Self-pay | Admitting: *Deleted

## 2017-07-23 ENCOUNTER — Other Ambulatory Visit: Payer: Self-pay | Admitting: Family Medicine

## 2017-07-23 NOTE — Telephone Encounter (Signed)
Pt has an appt with Dr. 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 They have already added a 4:15 and a 12:30 pt to your schedule tomorrow, please advise

## 2017-07-23 NOTE — Telephone Encounter (Signed)
Copied from [place]. Topic: Appointment Scheduling - Scheduling Inquiry for Clinic >> Jul 23, 2017  3:58 PM Aurelio Brash B wrote: Reason for CRM: pt would like to be worked in tomorrow if possible,  tried to get her in [place]  but they are full too.  [facility]  and received cough med  but she is not better and refuses to go back there.   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

## 2017-07-23 NOTE — Telephone Encounter (Signed)
Please put her in for 10:00 thanks

## 2017-07-24 ENCOUNTER — Emergency Department
Admission: EM | Admit: 2017-07-24 | Discharge: 2017-07-24 | Disposition: A | Discharge status: Home / Self Care | Payer: BLUE CROSS/BLUE SHIELD | Attending: Emergency Medicine | Admitting: Emergency Medicine

## 2017-07-24 ENCOUNTER — Emergency Department: Payer: BLUE CROSS/BLUE SHIELD

## 2017-07-24 DIAGNOSIS — R05 Cough: Secondary | ICD-10-CM | POA: Diagnosis not present

## 2017-07-24 DIAGNOSIS — E119 Type 2 diabetes mellitus without complications: Secondary | ICD-10-CM | POA: Insufficient documentation

## 2017-07-24 DIAGNOSIS — E039 Hypothyroidism, unspecified: Secondary | ICD-10-CM | POA: Diagnosis not present

## 2017-07-24 DIAGNOSIS — Z87891 Personal history of nicotine dependence: Secondary | ICD-10-CM | POA: Diagnosis not present

## 2017-07-24 DIAGNOSIS — I10 Essential (primary) hypertension: Secondary | ICD-10-CM | POA: Insufficient documentation

## 2017-07-24 DIAGNOSIS — Z8542 Personal history of malignant neoplasm of other parts of uterus: Secondary | ICD-10-CM | POA: Insufficient documentation

## 2017-07-24 DIAGNOSIS — R509 Fever, unspecified: Secondary | ICD-10-CM | POA: Diagnosis not present

## 2017-07-24 DIAGNOSIS — Z7984 Long term (current) use of oral hypoglycemic drugs: Secondary | ICD-10-CM | POA: Diagnosis not present

## 2017-07-24 DIAGNOSIS — J40 Bronchitis, not specified as acute or chronic: Secondary | ICD-10-CM | POA: Diagnosis not present

## 2017-07-24 DIAGNOSIS — Z79899 Other long term (current) drug therapy: Secondary | ICD-10-CM | POA: Insufficient documentation

## 2017-07-24 DIAGNOSIS — R059 Cough, unspecified: Secondary | ICD-10-CM

## 2017-07-24 MED ORDER — IPRATROPIUM-ALBUTEROL 0.5-2.5 (3) MG/3ML IN SOLN
3.0000 mL | Freq: Once | RESPIRATORY_TRACT | Status: AC
  Administered 2017-07-24: 3 mL via RESPIRATORY_TRACT
  Filled 2017-07-24: qty 3

## 2017-07-24 MED ORDER — ALBUTEROL SULFATE HFA 108 (90 BASE) MCG/ACT IN AERS: 2.0000 | INHALATION_SPRAY | Freq: Four times a day (QID) | RESPIRATORY_TRACT | 0 refills | Status: DC | PRN

## 2017-07-24 NOTE — Telephone Encounter (Signed)
Called pt and she went to the ER so she doesn't need to be seen today. She did want to keep her appt with Dr. Lorelei Pont on Monday just incase she isn't feeling better

## 2017-07-24 NOTE — ED Notes (Signed)
Pt ambulatory to POV without difficulty. VSS. NAD. Resp non-labored and equal. Discharge instructions reviewed also her RX. No questions or comments during discharge.

## 2017-07-24 NOTE — ED Notes (Signed)
Pt ambulatory to XR at this time in NAD.

## 2017-07-24 NOTE — Discharge Instructions (Signed)
Please seek medical attention for any high fevers, chest pain, shortness of breath, change in behavior, persistent vomiting, bloody stool or any other new or concerning symptoms.  

## 2017-07-24 NOTE — ED Triage Notes (Signed)
Patient c/o productive cough beginning Monday, Sinus congestion X 2 days, and emesis beginning yesterday. Patient seen at urgent care for cough 3 days ago and prescribed cough medicine.

## 2017-07-24 NOTE — ED Provider Notes (Signed)
[facility] Emergency Department Provider Note    ____________________________________________   I have reviewed the triage vital signs and the nursing notes.   HISTORY  Chief Complaint Emesis and Cough   History limited by: Not Limited   HPI Taylor Grimes is a 57 y.o. female who presents to the emergency department today because of cough.   DURATION:4 days  TIMING: constant SEVERITY: severe QUALITY: phlegm containing CONTEXT: patient states she developed a cough on Monday. Went to an urgent care where she was given cough medication. She states that she has continued to cough despite the medication.  MODIFYING FACTORS: no improvement with cough medication ASSOCIATED SYMPTOMS: denies any chest pain. Has had subjective fevers. Some nausea.   Per medical record review patient has a history of HTN.   Past Medical History:  Diagnosis Date  . Anxiety   . Breast lesion    benign  . Diabetes mellitus without complication ([place])   . Elevated transaminase level    ? fatty liver   . 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  From SGO Guidelines:  Recommended surveillance after treatment of endometrial cancer includes a follow-up visit every 3-6 months for 2 years, then every 6 months for 3 years, and annually thereafter. Each follow-up visit should include a thorough patient history; elicitation and investigation of any new symptoms associated with recurrence, such as vaginal bleeding, pelvic pain, weight loss, or lethargy; and a thorough speculum, pelvic, and rectovaginal examination  . HTN (hypertension)   . Hypothyroid   . UTI (urinary tract infection)     Patient Active Problem List   Diagnosis Date Noted  . Stress reaction 09/12/2015  . Back pain, thoracic 01/23/2015  . Low HDL (under 40) 10/24/2014  . Routine general medical examination at a health  care facility 03/08/2014  . History of endometrial cancer 12/15/2012  . Colon cancer screening 11/01/2012  . DM type 2 (diabetes mellitus, type 2) ([place]) 06/30/2012  . Varicose veins of leg with swelling, left 11/18/2011  . Morbid obesity ([place]) 11/12/2010  . Essential hypertension 07/12/2009  . Hypothyroidism 05/16/2009  . TRANSAMINASES, SERUM, ELEVATED 05/16/2009    Past Surgical History:  Procedure Laterality Date  . ABDOMINAL HYSTERECTOMY    . BREAST BIOPSY     benign cyst 2011  . CESAREAN SECTION    . LAPAROSCOPIC TOTAL HYSTERECTOMY  11/2012    Prior to Admission medications   Medication Sig Start Date End Date Taking? Authorizing Provider  glimepiride (AMARYL) 2 MG tablet Take 1 tablet (2 mg total) by mouth daily before breakfast. 06/17/17   Tower, Wynelle Fanny, MD  levothyroxine (SYNTHROID, LEVOTHROID) 125 MCG tablet TAKE ONE (1) TABLET BY MOUTH EVERY DAY 07/23/17   Tower, Wynelle Fanny, MD  losartan (COZAAR) 50 MG tablet TAKE ONE (1) TABLET BY MOUTH EVERY DAY 06/17/17   Tower, Wynelle Fanny, MD  metFORMIN (GLUCOPHAGE) 1000 MG tablet Take 1 tablet (1,000 mg total) by mouth 2 (two) times daily with a meal. 06/17/17   Tower, Wynelle Fanny, MD    Allergies Patient has no known allergies.  Family History  Problem Relation Age of Onset  . Hypothyroidism Mother   . Alzheimer's disease Mother   . Stroke Father   . Deep vein thrombosis Sister   . Colon cancer Neg Hx   . Esophageal cancer Neg Hx   . Stomach cancer Neg Hx   . Rectal cancer  Neg Hx     Social History Social History   Tobacco Use  . Smoking status: Former Smoker    Last attempt to quit: 07/22/1979    Years since quitting: 38.0  . Smokeless tobacco: Never Used  . Tobacco comment: briefly smoked at 16  Substance Use Topics  . Alcohol use: No    Alcohol/week: 0.0 oz    Comment: rarely  . Drug use: No    Review of Systems Constitutional: Positive for low grade fever. Eyes: No visual changes. ENT: No sore  throat. Cardiovascular: Denies chest pain. Respiratory: Positive for cough. Gastrointestinal: No abdominal pain.  No nausea, no vomiting.  No diarrhea.   Genitourinary: Negative for dysuria. Musculoskeletal: Negative for back pain. Skin: Negative for rash. Neurological: Negative for headaches, focal weakness or numbness.  ____________________________________________   PHYSICAL EXAM:  VITAL SIGNS: ED Triage Vitals [07/24/17 0625]  Enc Vitals Group     BP (!) 154/98     Pulse Rate 84     Resp 18     Temp 98.3 F (36.8 C)     Temp Source Oral     SpO2 100 %     Weight 221 lb (100.2 kg)     Height 5\' 1"  (1.549 m)   Constitutional: Alert and oriented. Well appearing and in no distress. Eyes: Conjunctivae are normal.  ENT   Head: Normocephalic and atraumatic.   Nose: No congestion/rhinnorhea.   Mouth/Throat: Mucous membranes are moist.   Neck: No stridor. Hematological/Lymphatic/Immunilogical: No cervical lymphadenopathy. Cardiovascular: Normal rate, regular rhythm.  No murmurs, rubs, or gallops.  Respiratory: Normal respiratory effort without tachypnea nor retractions. Breath sounds are clear and equal bilaterally. Very slight expiratory wheeze. Gastrointestinal: Soft and non tender. No rebound. No guarding.  Genitourinary: Deferred Musculoskeletal: Normal range of motion in all extremities. No lower extremity edema. Neurologic:  Normal speech and language. No gross focal neurologic deficits are appreciated.  Skin:  Skin is warm, dry and intact. No rash noted. Psychiatric: Mood and affect are normal. Speech and behavior are normal. Patient exhibits appropriate insight and judgment.  ____________________________________________    LABS (pertinent positives/negatives)  None  ____________________________________________   EKG  None  ____________________________________________    RADIOLOGY  CXR Mild bronchial  thickening.  ____________________________________________   PROCEDURES  Procedures  ____________________________________________   INITIAL IMPRESSION / ASSESSMENT AND PLAN / ED COURSE  Pertinent labs & imaging results that were available during my care of the patient were reviewed by me and considered in my medical decision making (see chart for details).  Patient presents to the emergency department today with continued cough. ddx would include pneumonia, bronchitis, COPD, acid reflux amongst other etiologies. CXR without PNA, PTX. Think likely viral URI. Discussed findings and plan with patient.  ____________________________________________   FINAL CLINICAL IMPRESSION(S) / ED DIAGNOSES  Final diagnoses:  Cough  Bronchitis     Note: This dictation was prepared with Dragon dictation. Any transcriptional errors that result from this process are unintentional     Nance Pear, MD 07/24/17 [phone]

## 2017-07-27 ENCOUNTER — Other Ambulatory Visit: Payer: Self-pay

## 2017-07-27 ENCOUNTER — Encounter: Payer: Self-pay | Admitting: Family Medicine

## 2017-07-27 ENCOUNTER — Ambulatory Visit: Payer: BLUE CROSS/BLUE SHIELD | Admitting: Family Medicine

## 2017-07-27 VITALS — BP 110/74 | HR 72 | Temp 98.5°F | Ht 61.25 in | Wt 225.2 lb

## 2017-07-27 DIAGNOSIS — J069 Acute upper respiratory infection, unspecified: Secondary | ICD-10-CM | POA: Diagnosis not present

## 2017-07-27 MED ORDER — HYDROCODONE-HOMATROPINE 5-1.5 MG/5ML PO SYRP: ORAL_SOLUTION | ORAL | 0 refills | Status: DC

## 2017-07-27 NOTE — Progress Notes (Signed)
Dr. Frederico Hamman T. Aleina Burgio, MD, [facility] [place], [address] Phone: [phone] Fax: [phone]  07/27/2017  Patient: Taylor Grimes, MRN: [id number], DOB: 05-Dec-1960, 57 y.o.  Primary Physician:  Tower, Wynelle Fanny, MD   Chief Complaint  Patient presents with  . Cough    seen at [facility] Wednesday/Seen at ED on Friday   Subjective:   Taylor Grimes is a 57 y.o. very pleasant female patient who presents with the following:  Last Sat, began to be sick, then started coughing reallly bad, and then went to [facility]. Took some [employer]. Went to the ER on Friday and was given an inhaler.   Starting to feel better some now. If quitting coughing.   Past Medical History, Surgical History, Social History, Family History, Problem List, Medications, and Allergies have been reviewed and updated if relevant.  Patient Active Problem List   Diagnosis Date Noted  . Stress reaction 09/12/2015  . Back pain, thoracic 01/23/2015  . Low HDL (under 40) 10/24/2014  . Routine general medical examination at a health care facility 03/08/2014  . History of endometrial cancer 12/15/2012  . Colon cancer screening 11/01/2012  . DM type 2 (diabetes mellitus, type 2) ([place]) 06/30/2012  . Varicose veins of leg with swelling, left 11/18/2011  . Morbid obesity ([place]) 11/12/2010  . Essential hypertension 07/12/2009  . Hypothyroidism 05/16/2009  . TRANSAMINASES, SERUM, ELEVATED 05/16/2009    Past Medical History:  Diagnosis Date  . Anxiety   . Breast lesion    benign  . Diabetes mellitus without complication ([place])   . Elevated transaminase level    ? fatty liver   . 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  From SGO Guidelines:  Recommended surveillance after treatment of endometrial cancer includes a follow-up visit every 3-6 months for 2  years, then every 6 months for 3 years, and annually thereafter. Each follow-up visit should include a thorough patient history; elicitation and investigation of any new symptoms associated with recurrence, such as vaginal bleeding, pelvic pain, weight loss, or lethargy; and a thorough speculum, pelvic, and rectovaginal examination  . HTN (hypertension)   . Hypothyroid   . UTI (urinary tract infection)     Past Surgical History:  Procedure Laterality Date  . ABDOMINAL HYSTERECTOMY    . BREAST BIOPSY     benign cyst 2011  . CESAREAN SECTION    . LAPAROSCOPIC TOTAL HYSTERECTOMY  11/2012    Social History   Socioeconomic History  . Marital status: Legally Separated    Spouse name: Not on file  . Number of children: 2  . Years of education: Not on file  . Highest education level: Not on file  Social Needs  . Financial resource strain: Not on file  . Food insecurity - worry: Not on file  . Food insecurity - inability: Not on file  . Transportation needs - medical: Not on file  . Transportation needs - non-medical: Not on file  Occupational History  . Occupation: shift Programme researcher, broadcasting/film/video: [employer] Use  . Smoking status: Former Smoker    Last attempt to quit: 07/22/1979    Years since quitting: 38.0  . Smokeless tobacco: Never Used  . Tobacco comment: briefly smoked at 16  Substance and Sexual Activity  . Alcohol use: No    Alcohol/week: 0.0 oz  Comment: rarely  . Drug use: No  . Sexual activity: Yes    Birth control/protection: Post-menopausal  Other Topics Concern  . Not on file  Social History Narrative  . Not on file    Family History  Problem Relation Age of Onset  . Hypothyroidism Mother   . Alzheimer's disease Mother   . Stroke Father   . Deep vein thrombosis Sister   . Colon cancer Neg Hx   . Esophageal cancer Neg Hx   . Stomach cancer Neg Hx   . Rectal cancer Neg Hx     No Known Allergies  Medication list reviewed and updated in  full in [place].  ROS: GEN: Acute illness details above GI: Tolerating PO intake GU: maintaining adequate hydration and urination Pulm: No SOB Interactive and getting along well at home.  Otherwise, ROS is as per the HPI.  Objective:   BP 110/74   Pulse 72   Temp 98.5 F (36.9 C) (Oral)   Ht 5' 1.25" (1.556 m)   Wt 225 lb 4 oz (102.2 kg)   LMP 07/21/2008   SpO2 97%   BMI 42.21 kg/m    Gen: WDWN, NAD; A & O x3, cooperative. Pleasant.Globally Non-toxic HEENT: Normocephalic and atraumatic. Throat clear, w/o exudate, R TM clear, L TM - good landmarks, No fluid present. rhinnorhea.  MMM Frontal sinuses: NT Max sinuses: NT NECK: Anterior cervical  LAD is absent CV: RRR, No M/G/R, cap refill <2 sec PULM: Breathing comfortably in no respiratory distress. no wheezing, crackles, rhonchi EXT: No c/c/e PSYCH: Friendly, good eye contact MSK: Nml gait     Laboratory and Imaging Data:  Assessment and Plan:   URI, acute  Supportive care  Follow-up: No Follow-up on file.  Meds ordered this encounter  Medications  . HYDROcodone-homatropine (HYCODAN) 5-1.5 MG/5ML syrup    Sig: 1 tsp po at night before bed prn cough    Dispense:  120 mL    Refill:  0   Medications Discontinued During This Encounter  Medication Reason  . benzonatate (TESSALON) 200 MG capsule Ineffective   Signed,  Nakiya Rallis T. Tyrann Donaho, MD   Allergies as of 07/27/2017   No Known Allergies     Medication List        Accurate as of 07/27/17  1:21 PM. Always use your most recent med list.          albuterol 108 (90 Base) MCG/ACT inhaler Commonly known as:  PROVENTIL HFA;VENTOLIN HFA Inhale 2 puffs into the lungs every 6 (six) hours as needed for wheezing or shortness of breath.   glimepiride 2 MG tablet Commonly known as:  AMARYL Take 1 tablet (2 mg total) by mouth daily before breakfast.   HYDROcodone-homatropine 5-1.5 MG/5ML syrup Commonly known as:  HYCODAN 1 tsp po at night before  bed prn cough   levothyroxine 125 MCG tablet Commonly known as:  SYNTHROID, LEVOTHROID TAKE ONE (1) TABLET BY MOUTH EVERY DAY   losartan 50 MG tablet Commonly known as:  COZAAR TAKE ONE (1) TABLET BY MOUTH EVERY DAY   metFORMIN 1000 MG tablet Commonly known as:  GLUCOPHAGE Take 1 tablet (1,000 mg total) by mouth 2 (two) times daily with a meal.

## 2017-09-01 DIAGNOSIS — J00 Acute nasopharyngitis [common cold]: Secondary | ICD-10-CM | POA: Diagnosis not present

## 2017-09-01 DIAGNOSIS — J069 Acute upper respiratory infection, unspecified: Secondary | ICD-10-CM | POA: Diagnosis not present

## 2018-01-07 ENCOUNTER — Telehealth: Payer: Self-pay | Admitting: Internal Medicine

## 2018-01-07 MED ORDER — LEVOTHYROXINE SODIUM 125 MCG PO TABS: ORAL_TABLET | ORAL | 0 refills | Status: DC

## 2018-01-07 NOTE — Telephone Encounter (Addendum)
Patient stated she need a refill of her levothyroxine 125 mcg,  Was denied at [facility] in [place] today she have 15 pills left and would like another refill until she can be seen.    Called [facility]

## 2018-01-07 NOTE — Telephone Encounter (Signed)
No message from pharmacy, the refill was not denied by our office. Sent 90 day supply to last until her appointment

## 2018-01-26 ENCOUNTER — Telehealth: Payer: Self-pay | Admitting: Family Medicine

## 2018-01-26 DIAGNOSIS — E119 Type 2 diabetes mellitus without complications: Secondary | ICD-10-CM

## 2018-01-26 DIAGNOSIS — I10 Essential (primary) hypertension: Secondary | ICD-10-CM

## 2018-01-26 DIAGNOSIS — E786 Lipoprotein deficiency: Secondary | ICD-10-CM

## 2018-01-26 NOTE — Telephone Encounter (Signed)
Copied from [facility] [id number]. Topic: Quick Communication - See Telephone Encounter >> Jan 26, 2018  4:00 PM Neva Seat wrote: Pt is needing orders for her diabetes testing and an appt this week.

## 2018-01-27 ENCOUNTER — Telehealth: Payer: Self-pay | Admitting: Family Medicine

## 2018-01-27 NOTE — Telephone Encounter (Signed)
-----   Message from Ellamae Sia sent at 01/27/2018  3:00 PM EDT ----- Regarding: Lab orders for Thursday, 7.11.19 Lab orders for a f/u appt

## 2018-01-27 NOTE — Telephone Encounter (Signed)
done

## 2018-01-27 NOTE — Telephone Encounter (Signed)
Called pt and she was just trying to schedule her 6 month f/u with Dr. Glori Bickers (labs prior)  I scheduled appt, pt is off tomorrow so she wanted to come on in for labs, appt scheduled, Dr. Glori Bickers please put lab orders in pt will be in tomorrow morning

## 2018-01-28 ENCOUNTER — Other Ambulatory Visit (INDEPENDENT_AMBULATORY_CARE_PROVIDER_SITE_OTHER): Payer: BLUE CROSS/BLUE SHIELD

## 2018-01-28 DIAGNOSIS — I10 Essential (primary) hypertension: Secondary | ICD-10-CM

## 2018-01-28 DIAGNOSIS — E786 Lipoprotein deficiency: Secondary | ICD-10-CM

## 2018-01-28 DIAGNOSIS — E119 Type 2 diabetes mellitus without complications: Secondary | ICD-10-CM

## 2018-01-28 LAB — LIPID PANEL
Cholesterol: 158 mg/dL (ref 0–200)
HDL: 42.9 mg/dL (ref >39.00)
LDL Cholesterol: 84 mg/dL (ref 0–99)
NonHDL: 115.16
Total CHOL/HDL Ratio: 4
Triglycerides: 157 mg/dL — ABNORMAL HIGH (ref 0.0–149.0)
VLDL: 31.4 mg/dL (ref 0.0–40.0)

## 2018-01-28 LAB — COMPREHENSIVE METABOLIC PANEL
ALT: 18 U/L (ref 0–35)
AST: 18 U/L (ref 0–37)
Albumin: 3.7 g/dL (ref 3.5–5.2)
Alkaline Phosphatase: 96 U/L (ref 39–117)
BUN: 10 mg/dL (ref 6–23)
CO2: 31 mEq/L (ref 19–32)
Calcium: 9.4 mg/dL (ref 8.4–10.5)
Chloride: 101 mEq/L (ref 96–112)
Creatinine, Ser: 0.8 mg/dL (ref 0.40–1.20)
GFR: 78.03 mL/min (ref >60.00)
Glucose, Bld: 118 mg/dL — ABNORMAL HIGH (ref 70–99)
Potassium: 4.2 mEq/L (ref 3.5–5.1)
Sodium: 138 mEq/L (ref 135–145)
Total Bilirubin: 0.6 mg/dL (ref 0.2–1.2)
Total Protein: 7.3 g/dL (ref 6.0–8.3)

## 2018-01-28 LAB — HEMOGLOBIN A1C: Hgb A1c MFr Bld: 6.4 % (ref 4.6–6.5)

## 2018-02-02 ENCOUNTER — Encounter: Payer: Self-pay | Admitting: Family Medicine

## 2018-02-02 ENCOUNTER — Ambulatory Visit: Payer: BLUE CROSS/BLUE SHIELD | Admitting: Family Medicine

## 2018-02-02 VITALS — BP 122/76 | HR 84 | Temp 98.0°F | Ht 61.25 in | Wt 222.8 lb

## 2018-02-02 DIAGNOSIS — E786 Lipoprotein deficiency: Secondary | ICD-10-CM | POA: Diagnosis not present

## 2018-02-02 DIAGNOSIS — E119 Type 2 diabetes mellitus without complications: Secondary | ICD-10-CM

## 2018-02-02 DIAGNOSIS — I10 Essential (primary) hypertension: Secondary | ICD-10-CM | POA: Diagnosis not present

## 2018-02-02 DIAGNOSIS — L602 Onychogryphosis: Secondary | ICD-10-CM | POA: Diagnosis not present

## 2018-02-02 MED ORDER — ATORVASTATIN CALCIUM 10 MG PO TABS: 10.0000 mg | ORAL_TABLET | Freq: Every day | ORAL | 11 refills | Status: DC

## 2018-02-02 NOTE — Progress Notes (Signed)
Subjective:    Patient ID: Taylor Grimes, female    DOB: Oct 13, 1960, 57 y.o.   MRN: [id number]  HPI  Here for 6 mo f/u of chronic health problems  Feeling pretty good overall  Tired from work    [facility] Readings from Last 3 Encounters:  02/02/18 222 lb 12 oz (101 kg)  07/27/17 225 lb 4 oz (102.2 kg)  07/24/17 221 lb (100.2 kg)  down a few lbs  She is eating better  Walking more for exercise / also active job  41.75 kg/m   bp is stable today  No cp or palpitations or headaches or edema  No side effects to medicines  BP Readings from Last 3 Encounters:  02/02/18 122/76  07/27/17 110/74  07/24/17 132/79    Lab Results  Component Value Date   CREATININE 0.80 01/28/2018   BUN 10 01/28/2018   NA 138 01/28/2018   K 4.2 01/28/2018   CL 101 01/28/2018   CO2 31 01/28/2018   Lab Results  Component Value Date   ALT 18 01/28/2018   AST 18 01/28/2018   ALKPHOS 96 01/28/2018   BILITOT 0.6 01/28/2018      DM2 Lab Results  Component Value Date   HGBA1C 6.4 01/28/2018  eye exam neg 5/17- had one last year as well  Fasting glucose 118  No hypoglycemia  Down from 6.5  Well controlled - eating better /avoiding fatty foods /high calorie foods More water Binnie Kand drinks  Staying away from sugar   Due for a pneumovax -declines today   Due for tetanus shot - 10 y    10/08 Declines today    Lipids Lab Results  Component Value Date   CHOL 158 01/28/2018   CHOL 187 06/17/2017   CHOL 161 06/18/2016   Lab Results  Component Value Date   HDL 42.90 01/28/2018   HDL 37.00 (L) 06/17/2017   HDL 38.20 (L) 06/18/2016   Lab Results  Component Value Date   LDLCALC 84 01/28/2018   LDLCALC 90 06/18/2016   LDLCALC 110 (H) 08/10/2015   Lab Results  Component Value Date   TRIG 157.0 (H) 01/28/2018   TRIG 208.0 (H) 06/17/2017   TRIG 164.0 (H) 06/18/2016   Lab Results  Component Value Date   CHOLHDL 4 01/28/2018   CHOLHDL 5 06/17/2017   CHOLHDL 4 06/18/2016   Lab Results    Component Value Date   LDLDIRECT 113.0 06/17/2017   Not on a statin yet- we will start that   Patient Active Problem List   Diagnosis Date Noted  . Hypertrophic toenail 02/02/2018  . Stress reaction 09/12/2015  . Back pain, thoracic 01/23/2015  . Low HDL (under 40) 10/24/2014  . Routine general medical examination at a health care facility 03/08/2014  . History of endometrial cancer 12/15/2012  . Colon cancer screening 11/01/2012  . DM type 2 (diabetes mellitus, type 2) ([place]) 06/30/2012  . Varicose veins of leg with swelling, left 11/18/2011  . Morbid obesity ([place]) 11/12/2010  . Essential hypertension 07/12/2009  . Hypothyroidism 05/16/2009  . TRANSAMINASES, SERUM, ELEVATED 05/16/2009   Past Medical History:  Diagnosis Date  . Anxiety   . Breast lesion    benign  . Diabetes mellitus without complication ([place])   . Elevated transaminase level    ? fatty liver   . 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  From SGO Guidelines:  Recommended  surveillance after treatment of endometrial cancer includes a follow-up visit every 3-6 months for 2 years, then every 6 months for 3 years, and annually thereafter. Each follow-up visit should include a thorough patient history; elicitation and investigation of any new symptoms associated with recurrence, such as vaginal bleeding, pelvic pain, weight loss, or lethargy; and a thorough speculum, pelvic, and rectovaginal examination  . HTN (hypertension)   . Hypothyroid   . UTI (urinary tract infection)    Past Surgical History:  Procedure Laterality Date  . ABDOMINAL HYSTERECTOMY    . BREAST BIOPSY     benign cyst 2011  . CESAREAN SECTION    . LAPAROSCOPIC TOTAL HYSTERECTOMY  11/2012   Social History   Tobacco Use  . Smoking status: Former Smoker    Last attempt to quit: 07/22/1979    Years since quitting: 38.5  . Smokeless tobacco: Never  Used  . Tobacco comment: briefly smoked at 16  Substance Use Topics  . Alcohol use: No    Alcohol/week: 0.0 oz    Comment: rarely  . Drug use: No   Family History  Problem Relation Age of Onset  . Hypothyroidism Mother   . Alzheimer's disease Mother   . Stroke Father   . Deep vein thrombosis Sister   . Colon cancer Neg Hx   . Esophageal cancer Neg Hx   . Stomach cancer Neg Hx   . Rectal cancer Neg Hx    No Known Allergies Current Outpatient Medications on File Prior to Visit  Medication Sig Dispense Refill  . glimepiride (AMARYL) 2 MG tablet Take 1 tablet (2 mg total) by mouth daily before breakfast. 30 tablet 11  . levothyroxine (SYNTHROID, LEVOTHROID) 125 MCG tablet TAKE ONE (1) TABLET BY MOUTH EVERY DAY 90 tablet 0  . losartan (COZAAR) 50 MG tablet TAKE ONE (1) TABLET BY MOUTH EVERY DAY 30 tablet 11  . metFORMIN (GLUCOPHAGE) 1000 MG tablet Take 1 tablet (1,000 mg total) by mouth 2 (two) times daily with a meal. 60 tablet 11   No current facility-administered medications on file prior to visit.     Review of Systems  Constitutional: Positive for fatigue. Negative for activity change, appetite change, fever and unexpected weight change.  HENT: Negative for congestion, ear pain, rhinorrhea, sinus pressure and sore throat.   Eyes: Negative for pain, redness and visual disturbance.  Respiratory: Negative for cough, shortness of breath and wheezing.   Cardiovascular: Negative for chest pain and palpitations.  Gastrointestinal: Negative for abdominal pain, blood in stool, constipation and diarrhea.  Endocrine: Negative for polydipsia and polyuria.  Genitourinary: Negative for dysuria, frequency and urgency.  Musculoskeletal: Negative for arthralgias, back pain and myalgias.  Skin: Negative for pallor and rash.       Toe nails are too thick to cut  Allergic/Immunologic: Negative for environmental allergies.  Neurological: Negative for dizziness, syncope and headaches.    Hematological: Negative for adenopathy. Does not bruise/bleed easily.  Psychiatric/Behavioral: Negative for decreased concentration and dysphoric mood. The patient is not nervous/anxious.        Objective:   Physical Exam  Constitutional: She appears well-developed and well-nourished. No distress.  obese and well appearing   HENT:  Head: Normocephalic and atraumatic.  Mouth/Throat: Oropharynx is clear and moist.  Eyes: Pupils are equal, round, and reactive to light. Conjunctivae and EOM are normal.  Neck: Normal range of motion. Neck supple. No JVD present. Carotid bruit is not present. No thyromegaly present.  Cardiovascular: Normal rate, regular  rhythm, normal heart sounds and intact distal pulses. Exam reveals no gallop.  Leg varicosities  Pulmonary/Chest: Effort normal and breath sounds normal. No respiratory distress. She has no wheezes. She has no rales.  No crackles  Abdominal: Soft. Bowel sounds are normal. She exhibits no distension, no abdominal bruit and no mass. There is no tenderness.  Musculoskeletal: She exhibits no edema.  Lymphadenopathy:    She has no cervical adenopathy.  Neurological: She is alert. She has normal reflexes. She displays normal reflexes. No cranial nerve deficit. Coordination normal.  Skin: Skin is warm and dry. No rash noted. No erythema. No pallor.  sks noted Thick toe nails   Psychiatric: She has a normal mood and affect.          Assessment & Plan:   Problem List Items Addressed This Visit      Cardiovascular and Mediastinum   Essential hypertension    bp in fair control at this time  BP Readings from Last 1 Encounters:  02/02/18 122/76   No changes needed Most recent labs reviewed  Disc lifstyle change with low sodium diet and exercise        Relevant Medications   atorvastatin (LIPITOR) 10 MG tablet     Endocrine   DM type 2 (diabetes mellitus, type 2) ([place]) - Primary    Lab Results  Component Value Date   HGBA1C 6.4  01/28/2018   Staying in control  Urged low glycemic diet  Declines pneumovax until next time  Sent for eye exam report       Relevant Medications   atorvastatin (LIPITOR) 10 MG tablet   Other Relevant Orders   Ambulatory referral to Podiatry     Musculoskeletal and Integument   Hypertrophic toenail    Cannot trim them herself- ref to podiatry      Relevant Orders   Ambulatory referral to Podiatry     Other   Low HDL (under 40)    Disc goals for lipids and reasons to control them Rev last labs with pt Rev low sat fat diet in detail LDL is 84 In light of DM2 will add atorvastatin 10 mg daily  Alert if side effects        Morbid obesity ([place])    Discussed how this problem influences overall health and the risks it imposes  Reviewed plan for weight loss with lower calorie diet (via better food choices and also portion control or program like weight watchers) and exercise building up to or more than 30 minutes 5 days per week including some aerobic activity

## 2018-02-02 NOTE — Patient Instructions (Signed)
We will refer you to foot doctor for toe nail care   Keep working on weight loss and healthy diet   Start atorvastatin (this is to protect your vascular system from diabetes by keeping cholesterol very low)  10 mg each day in the evening If any side effects -stop it and let us know   Follow up in 6 months for annual exam   Get your eye exam when it is time You are due for tetanus and pneumonia shot when you return

## 2018-02-03 NOTE — Assessment & Plan Note (Signed)
Discussed how this problem influences overall health and the risks it imposes  Reviewed plan for weight loss with lower calorie diet (via better food choices and also portion control or program like weight watchers) and exercise building up to or more than 30 minutes 5 days per week including some aerobic activity    

## 2018-02-03 NOTE — Assessment & Plan Note (Signed)
bp in fair control at this time  BP Readings from Last 1 Encounters:  02/02/18 122/76   No changes needed Most recent labs reviewed  Disc lifstyle change with low sodium diet and exercise

## 2018-02-03 NOTE — Assessment & Plan Note (Signed)
Cannot trim them herself- ref to podiatry

## 2018-02-03 NOTE — Assessment & Plan Note (Signed)
Lab Results  Component Value Date   HGBA1C 6.4 01/28/2018   Staying in control  Urged low glycemic diet  Declines pneumovax until next time  Sent for eye exam report

## 2018-02-03 NOTE — Assessment & Plan Note (Signed)
Disc goals for lipids and reasons to control them Rev last labs with pt Rev low sat fat diet in detail LDL is 84 In light of DM2 will add atorvastatin 10 mg daily  Alert if side effects

## 2018-02-18 ENCOUNTER — Encounter: Payer: Self-pay | Admitting: Podiatry

## 2018-02-18 ENCOUNTER — Ambulatory Visit: Payer: BLUE CROSS/BLUE SHIELD | Admitting: Podiatry

## 2018-02-18 DIAGNOSIS — E119 Type 2 diabetes mellitus without complications: Secondary | ICD-10-CM | POA: Diagnosis not present

## 2018-02-18 DIAGNOSIS — B351 Tinea unguium: Secondary | ICD-10-CM

## 2018-02-18 DIAGNOSIS — M79675 Pain in left toe(s): Secondary | ICD-10-CM

## 2018-02-18 DIAGNOSIS — M79674 Pain in right toe(s): Secondary | ICD-10-CM

## 2018-02-18 NOTE — Progress Notes (Signed)
This patient presents to the office with chief complaint of long thick nails and diabetic feet.  This patient  says there  is  no pain and discomfort in her  feet.  This patient says there are long thick painful nails.  These nails are painful walking and wearing shoes.  Patient has no history of infection or drainage from both feet.  Patient is unable to  self treat his own nails . This patient presents  to the office today for treatment of the  long nails and a foot evaluation due to history of  diabetes. ? ?General Appearance  Alert, conversant and in no acute stress. ? ?Vascular  Dorsalis pedis and posterior tibial  pulses are palpable  bilaterally.  Capillary return is within normal limits  bilaterally. Temperature is within normal limits  bilaterally. ? ?Neurologic  Senn-Weinstein monofilament wire test within normal limits  bilaterally. Muscle power within normal limits bilaterally. ? ?Nails Thick disfigured discolored nails with subungual debris  from hallux to fifth toes bilaterally. No evidence of bacterial infection or drainage bilaterally. ? ?Orthopedic  No limitations of motion of motion feet .  No crepitus or effusions noted.  No bony pathology or digital deformities noted. ? ?Skin  normotropic skin with no porokeratosis noted bilaterally.  No signs of infections or ulcers noted.    ? ?Onychomycosis  Diabetes with no foot complications ? ?IE  Debride nails x 10.  A diabetic foot exam was performed and there is no evidence of any vascular or neurologic pathology.   RTC 3 months. ? ? ?Aldyn Toon DPM   ?

## 2018-03-08 ENCOUNTER — Encounter: Payer: Self-pay | Admitting: Internal Medicine

## 2018-03-08 ENCOUNTER — Ambulatory Visit: Payer: BLUE CROSS/BLUE SHIELD | Admitting: Internal Medicine

## 2018-03-08 VITALS — BP 132/84 | HR 75 | Ht 61.25 in | Wt 227.4 lb

## 2018-03-08 DIAGNOSIS — E039 Hypothyroidism, unspecified: Secondary | ICD-10-CM | POA: Diagnosis not present

## 2018-03-08 LAB — T4, FREE: Free T4: 1.01 ng/dL (ref 0.60–1.60)

## 2018-03-08 LAB — TSH: TSH: 2.52 u[IU]/mL (ref 0.35–4.50)

## 2018-03-08 MED ORDER — LEVOTHYROXINE SODIUM 125 MCG PO TABS: ORAL_TABLET | ORAL | 3 refills | Status: DC

## 2018-03-08 NOTE — Progress Notes (Signed)
Patient ID: Taylor Grimes, female   DOB: [birth year], 59 y.o.   MRN: [id number]   HPI  Taylor Grimes is a 57 y.o.-year-old female, returning for f/u for hypothyroidism, dx 2012. Last visit 1 year ago. Her sister, Taylor Grimes, is also my pt.  Pt is on levothyroxine 125 mcg daily, taken: - in am - fasting - at least 30 min from b'fast - + Calcium - started this last visit - takes this in the evening - no Fe, MVI, PPIs - not on Biotin  TFTs were normal: Lab Results  Component Value Date   TSH 2.69 02/24/2017   TSH 1.37 06/18/2016   TSH 1.50 01/14/2016   TSH 1.68 09/28/2014   TSH 4.90 (H) 05/04/2014   TSH 4.60 (H) 03/09/2014   TSH 7.25 (H) 12/29/2013   TSH 9.07 (H) 11/03/2013   TSH 0.75 09/29/2013   TSH 0.14 (L) 08/18/2013   FREET4 1.15 02/24/2017   FREET4 1.44 01/14/2016   FREET4 1.60 09/28/2014   FREET4 1.14 05/04/2014   FREET4 1.05 03/09/2014   FREET4 1.14 09/29/2013   FREET4 1.03 08/18/2013   FREET4 0.87 11/12/2010   FREET4 0.73 09/23/2010   FREET4 0.90 07/29/2010    Pt denies: - feeling nodules in neck - hoarseness - dysphagia - choking - SOB with lying down  She still wakes up at 3 am to go to work and open [employer] at 4-7 am.   No FH of thyroid cancer. However, all women in her family: hypothyroidism. No h/o radiation tx to head or neck.  No seaweed or kelp. No recent contrast studies. No herbal supplements. No Biotin use. No recent steroids use.   ROS: Constitutional: no weight gain/no weight loss, no fatigue, no subjective hyperthermia, no subjective hypothermia Eyes: no blurry vision, no xerophthalmia ENT: no sore throat, + see HPI Cardiovascular: no CP/no SOB/no palpitations/no leg swelling Respiratory: no cough/no SOB/no wheezing Gastrointestinal: no N/no V/no D/no C/no acid reflux Musculoskeletal: no muscle aches/no joint aches Skin: no rashes, no hair loss Neurological: no tremors/no numbness/no tingling/no dizziness  I reviewed pt's medications,  allergies, PMH, social hx, family hx, and changes were documented in the history of present illness. Otherwise, unchanged from my initial visit note.  Past Medical History:  Diagnosis Date  . Anxiety   . Breast lesion    benign  . Diabetes mellitus without complication ([place])   . Elevated transaminase level    ? fatty liver   . 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  From SGO Guidelines:  Recommended surveillance after treatment of endometrial cancer includes a follow-up visit every 3-6 months for 2 years, then every 6 months for 3 years, and annually thereafter. Each follow-up visit should include a thorough patient history; elicitation and investigation of any new symptoms associated with recurrence, such as vaginal bleeding, pelvic pain, weight loss, or lethargy; and a thorough speculum, pelvic, and rectovaginal examination  . HTN (hypertension)   . Hypothyroid   . UTI (urinary tract infection)    Past Surgical History:  Procedure Laterality Date  . ABDOMINAL HYSTERECTOMY    . BREAST BIOPSY     benign cyst 2011  . CESAREAN SECTION    . LAPAROSCOPIC TOTAL HYSTERECTOMY  11/2012   Social History   Socioeconomic History  . Marital status: Legally Separated    Spouse name: Not on file  . Number of children: 2  . Years of  education: Not on file  . Highest education level: Not on file  Occupational History  . Occupation: shift Programme researcher, broadcasting/film/video: [employer]  . Financial resource strain: Not on file  . Food insecurity:    Worry: Not on file    Inability: Not on file  . Transportation needs:    Medical: Not on file    Non-medical: Not on file  Tobacco Use  . Smoking status: Former Smoker    Last attempt to quit: 07/22/1979    Years since quitting: 38.6  . Smokeless tobacco: Never Used  . Tobacco comment: briefly smoked at 16  Substance and Sexual  Activity  . Alcohol use: No    Alcohol/week: 0.0 standard drinks    Comment: rarely  . Drug use: No  . Sexual activity: Yes    Birth control/protection: Post-menopausal  Lifestyle  . Physical activity:    Days per week: Not on file    Minutes per session: Not on file  . Stress: Not on file  Relationships  . Social connections:    Talks on phone: Not on file    Gets together: Not on file    Attends religious service: Not on file    Active member of club or organization: Not on file    Attends meetings of clubs or organizations: Not on file    Relationship status: Not on file  . Intimate partner violence:    Fear of current or ex partner: Not on file    Emotionally abused: Not on file    Physically abused: Not on file    Forced sexual activity: Not on file  Other Topics Concern  . Not on file  Social History Narrative  . Not on file   Current Outpatient Medications on File Prior to Visit  Medication Sig Dispense Refill  . atorvastatin (LIPITOR) 10 MG tablet Take 1 tablet (10 mg total) by mouth daily. In evening with a low fat snack 30 tablet 11  . glimepiride (AMARYL) 2 MG tablet Take 1 tablet (2 mg total) by mouth daily before breakfast. 30 tablet 11  . levothyroxine (SYNTHROID, LEVOTHROID) 125 MCG tablet TAKE ONE (1) TABLET BY MOUTH EVERY DAY 90 tablet 0  . losartan (COZAAR) 50 MG tablet TAKE ONE (1) TABLET BY MOUTH EVERY DAY 30 tablet 11  . metFORMIN (GLUCOPHAGE) 1000 MG tablet Take 1 tablet (1,000 mg total) by mouth 2 (two) times daily with a meal. 60 tablet 11   No current facility-administered medications on file prior to visit.    No Known Allergies Family History  Problem Relation Age of Onset  . Hypothyroidism Mother   . Alzheimer's disease Mother   . Stroke Father   . Deep vein thrombosis Sister   . Colon cancer Neg Hx   . Esophageal cancer Neg Hx   . Stomach cancer Neg Hx   . Rectal cancer Neg Hx     PE: BP 132/84   Pulse 75   Ht 5' 1.25" (1.556 m)    Wt 227 lb 6.4 oz (103.1 kg)   LMP 07/21/2008   SpO2 95%   BMI 42.62 kg/m  Wt Readings from Last 3 Encounters:  03/08/18 227 lb 6.4 oz (103.1 kg)  02/02/18 222 lb 12 oz (101 kg)  07/27/17 225 lb 4 oz (102.2 kg)   Constitutional: overweight, in NAD Eyes: PERRLA, EOMI, no exophthalmos ENT: moist mucous membranes, no thyromegaly, no cervical lymphadenopathy Cardiovascular: RRR, No MRG Respiratory:  CTA B Gastrointestinal: abdomen soft, NT, ND, BS+ Musculoskeletal: no deformities, strength intact in all 4 Skin: moist, warm, no rashes Neurological: no tremor with outstretched hands, DTR normal in all 4  ASSESSMENT: 1. Hypothyroidism  PLAN:  1. Patient with h/o hypothyroidism, previously over replaced with levothyroxine, now with normal TFTs on the current levothyroxine dose - latest thyroid labs reviewed with pt >> normal  - she continues on LT4 125 mcg daily - pt feels good on this dose. - we discussed about taking the thyroid hormone every day, with water, >30 minutes before breakfast, separated by >4 hours from acid reflux medications, calcium, iron, multivitamins. Pt. is taking it correctly. - will check thyroid tests today: TSH and fT4 - If labs are abnormal, she will need to return for repeat TFTs in 1.5 months  - time spent with the patient: 15 min, of which >50% was spent in obtaining information about her symptoms, reviewing her previous labs, evaluations, and treatments, counseling her about her condition (please see the discussed topics above), and developing a plan to further investigate and treat it; she had a number of questions which I addressed.  Needs refills for 3 mo.  Office Visit on 03/08/2018  Component Date Value Ref Range Status  . TSH 03/08/2018 2.52  0.35 - 4.50 uIU/mL Final  . Free T4 03/08/2018 1.01  0.60 - 1.60 ng/dL Final   Comment: Specimens from patients who are undergoing biotin therapy and /or ingesting biotin supplements may contain high levels of  biotin.  The higher biotin concentration in these specimens interferes with this Free T4 assay.  Specimens that contain high levels  of biotin may cause false high results for this Free T4 assay.  Please interpret results in light of the total clinical presentation of the patient.     TFTs normal.  Philemon Kingdom, MD PhD [facility] Endocrinology

## 2018-03-08 NOTE — Patient Instructions (Signed)
Please stop at the lab.  Please continue Levothyroxine 125 mcg daily.   Take the thyroid hormone every day, with water, at least 30 minutes before breakfast, separated by at least 4 hours from: - acid reflux medications - calcium - iron - multivitamins  Please return in 1 year. 

## 2018-03-18 ENCOUNTER — Encounter: Payer: Self-pay | Admitting: Radiology

## 2018-04-14 ENCOUNTER — Other Ambulatory Visit: Payer: Self-pay | Admitting: Family Medicine

## 2018-04-14 DIAGNOSIS — Z1231 Encounter for screening mammogram for malignant neoplasm of breast: Secondary | ICD-10-CM

## 2018-04-30 DIAGNOSIS — H524 Presbyopia: Secondary | ICD-10-CM | POA: Diagnosis not present

## 2018-04-30 LAB — HM DIABETES EYE EXAM

## 2018-05-17 ENCOUNTER — Ambulatory Visit: Payer: BLUE CROSS/BLUE SHIELD | Admitting: Podiatry

## 2018-05-17 ENCOUNTER — Encounter: Payer: Self-pay | Admitting: Podiatry

## 2018-05-17 DIAGNOSIS — B351 Tinea unguium: Secondary | ICD-10-CM

## 2018-05-17 DIAGNOSIS — Q828 Other specified congenital malformations of skin: Secondary | ICD-10-CM

## 2018-05-17 DIAGNOSIS — M79676 Pain in unspecified toe(s): Secondary | ICD-10-CM | POA: Diagnosis not present

## 2018-05-17 NOTE — Progress Notes (Signed)
Complaint:  Visit Type: Patient returns to my office for continued preventative foot care services. Complaint: Patient states" my nails have grown long and thick and become painful to walk and wear shoes" Patient has been diagnosed with DM with no foot complications. The patient presents for preventative foot care services. No changes to ROS  Podiatric Exam: Vascular: dorsalis pedis and posterior tibial pulses are palpable bilateral. Capillary return is immediate. Temperature gradient is WNL. Skin turgor WNL  Sensorium: Normal Semmes Weinstein monofilament test. Normal tactile sensation bilaterally. Nail Exam: Pt has thick disfigured discolored nails with subungual debris noted bilateral entire nail hallux through fifth toenails Ulcer Exam: There is no evidence of ulcer or pre-ulcerative changes or infection. Orthopedic Exam: Muscle tone and strength are WNL. No limitations in general ROM. No crepitus or effusions noted. Foot type and digits show no abnormalities. Bony prominences are unremarkable. Skin:  Porokeratosis.  Sub 3rd left foot. No infection or ulcers  Diagnosis:  Onychomycosis, , Pain in right toe, pain in left toes.  Debride porokeratosis left  Treatment & Plan Procedures and Treatment: Consent by patient was obtained for treatment procedures.   Debridement of mycotic and hypertrophic toenails, 1 through 5 bilateral and clearing of subungual debris. No ulceration, no infection noted.  Return Visit-Office Procedure: Patient instructed to return to the office for a follow up visit 3 months for continued evaluation and treatment.    Hesper Venturella DPM 

## 2018-05-26 ENCOUNTER — Ambulatory Visit
Admission: RE | Admit: 2018-05-26 | Discharge: 2018-05-26 | Disposition: A | Discharge status: Home / Self Care | Payer: BLUE CROSS/BLUE SHIELD | Source: Ambulatory Visit | Attending: Family Medicine | Admitting: Family Medicine

## 2018-05-26 DIAGNOSIS — Z1231 Encounter for screening mammogram for malignant neoplasm of breast: Secondary | ICD-10-CM

## 2018-05-26 DIAGNOSIS — H5213 Myopia, bilateral: Secondary | ICD-10-CM | POA: Diagnosis not present

## 2018-06-25 ENCOUNTER — Encounter: Payer: Self-pay | Admitting: Family Medicine

## 2018-06-25 MED ORDER — LOSARTAN POTASSIUM 50 MG PO TABS: ORAL_TABLET | ORAL | 5 refills | Status: DC

## 2018-06-25 MED ORDER — GLIMEPIRIDE 2 MG PO TABS: 2.0000 mg | ORAL_TABLET | Freq: Every day | ORAL | 5 refills | Status: DC

## 2018-06-25 NOTE — Telephone Encounter (Signed)
She sees Dr Renne Crigler for thyroid (not DM) so I do her dM meds Please refill Thanks

## 2018-06-25 NOTE — Telephone Encounter (Signed)
See mychart message, ? If you are still filling the amaryl since pt sees Dr. Cruzita Lederer

## 2018-06-25 NOTE — Telephone Encounter (Signed)
Rxs sent

## 2018-07-26 ENCOUNTER — Encounter: Payer: Self-pay | Admitting: Family Medicine

## 2018-07-26 ENCOUNTER — Ambulatory Visit: Payer: BLUE CROSS/BLUE SHIELD | Admitting: Family Medicine

## 2018-07-26 VITALS — BP 138/80 | HR 70 | Temp 98.7°F | Ht 61.25 in | Wt 232.0 lb

## 2018-07-26 DIAGNOSIS — B9789 Other viral agents as the cause of diseases classified elsewhere: Secondary | ICD-10-CM | POA: Diagnosis not present

## 2018-07-26 DIAGNOSIS — J069 Acute upper respiratory infection, unspecified: Secondary | ICD-10-CM | POA: Diagnosis not present

## 2018-07-26 MED ORDER — BENZONATATE 100 MG PO CAPS: 100.0000 mg | ORAL_CAPSULE | Freq: Three times a day (TID) | ORAL | 0 refills | Status: DC | PRN

## 2018-07-26 NOTE — Progress Notes (Signed)
Subjective:    Patient ID: Taylor Grimes, female    DOB: [birth year], 58 y.o.   MRN: [id number]  HPI This is a 58 yo female who presents today with cough and runny nose x 1 week. Was getting better, but felt worse last night. Thick white phlegm, little nasal drainage. No sore throat, no ear pain, subjective fever. Took Tylenol, Mucinex. Positive sick contacts. She is a shift Librarian, academic at [employer]. No SOB, some wheeze that clears with cough. No history of asthma, remote smoking history.  DM, well controlled.   Past Medical History:  Diagnosis Date  . Anxiety   . Breast lesion    benign  . Diabetes mellitus without complication ([place])   . Elevated transaminase level    ? fatty liver   . 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  From SGO Guidelines:  Recommended surveillance after treatment of endometrial cancer includes a follow-up visit every 3-6 months for 2 years, then every 6 months for 3 years, and annually thereafter. Each follow-up visit should include a thorough patient history; elicitation and investigation of any new symptoms associated with recurrence, such as vaginal bleeding, pelvic pain, weight loss, or lethargy; and a thorough speculum, pelvic, and rectovaginal examination  . HTN (hypertension)   . Hypothyroid   . UTI (urinary tract infection)    Past Surgical History:  Procedure Laterality Date  . ABDOMINAL HYSTERECTOMY    . BREAST BIOPSY     benign cyst 2011  . CESAREAN SECTION    . LAPAROSCOPIC TOTAL HYSTERECTOMY  11/2012   Family History  Problem Relation Age of Onset  . Hypothyroidism Mother   . Alzheimer's disease Mother   . Stroke Father   . Deep vein thrombosis Sister   . Colon cancer Neg Hx   . Esophageal cancer Neg Hx   . Stomach cancer Neg Hx   . Rectal cancer Neg Hx    Social History   Tobacco Use  . Smoking status: Former Smoker    Last attempt to  quit: 07/22/1979    Years since quitting: 39.0  . Smokeless tobacco: Never Used  . Tobacco comment: briefly smoked at 16  Substance Use Topics  . Alcohol use: No    Alcohol/week: 0.0 standard drinks    Comment: rarely  . Drug use: No      Review of Systems Per HPI    Objective:   Physical Exam Vitals signs reviewed.  Constitutional:      Appearance: Normal appearance. She is obese. She is not ill-appearing.  HENT:     Head: Normocephalic and atraumatic.     Right Ear: Tympanic membrane, ear canal and external ear normal.     Left Ear: Tympanic membrane, ear canal and external ear normal.     Nose: Congestion and rhinorrhea present.     Mouth/Throat:     Mouth: Mucous membranes are moist.     Pharynx: Oropharynx is clear.  Eyes:     Conjunctiva/sclera: Conjunctivae normal.  Neck:     Musculoskeletal: Normal range of motion and neck supple.  Cardiovascular:     Rate and Rhythm: Normal rate and regular rhythm.     Heart sounds: Normal heart sounds.  Pulmonary:     Effort: Pulmonary effort is normal.     Breath sounds: Wheezing (few in bases, clear with cough) present.  Lymphadenopathy:     Cervical: No cervical adenopathy.  Skin:    General: Skin is warm and dry.  Neurological:     Mental Status: She is alert and oriented to person, place, and time.  Psychiatric:        Mood and Affect: Mood normal.        Behavior: Behavior normal.        Thought Content: Thought content normal.        Judgment: Judgment normal.       BP 138/80   Pulse 70   Temp 98.7 F (37.1 C) (Oral)   Ht 5' 1.25" (1.556 m)   Wt 232 lb (105.2 kg)   LMP 07/21/2008   SpO2 98%   BMI 43.48 kg/m  Wt Readings from Last 3 Encounters:  07/26/18 232 lb (105.2 kg)  03/08/18 227 lb 6.4 oz (103.1 kg)  02/02/18 222 lb 12 oz (101 kg)       Assessment & Plan:  1. Viral URI with cough - Provided written and verbal information regarding diagnosis and treatment. -  Patient Instructions  Good  to see you today, I am sorry you are not feeling well  I have sent a cough pill to your pharmacy  Follow up if not better in 5-7 days- increase fluids until urine is light yellow, can use saline nasal spray if needed for nasal congestion, Tylenol or Advil for fever or pain (generic is fine)    - has follow up wit PCP next week - benzonatate (TESSALON) 100 MG capsule; Take 1-2 capsules (100-200 mg total) by mouth 3 (three) times daily as needed.  Dispense: 30 capsule; Refill: 0   Clarene Reamer, FNP-BC  [facility] [place]  07/26/2018 2:13 PM

## 2018-07-26 NOTE — Patient Instructions (Signed)
Good to see you today, I am sorry you are not feeling well  I have sent a cough pill to your pharmacy  Follow up if not better in 5-7 days- increase fluids until urine is light yellow, can use saline nasal spray if needed for nasal congestion, Tylenol or Advil for fever or pain (generic is fine)

## 2018-07-29 ENCOUNTER — Telehealth: Payer: Self-pay | Admitting: *Deleted

## 2018-07-29 NOTE — Telephone Encounter (Signed)
Please call patient and tell her that I am glad she is doing better. Ask if she is taking full 200 mg of Tessalon? Can also take plain Mucinex (store brand is fine) to help break up phlegm.

## 2018-07-29 NOTE — Telephone Encounter (Signed)
Spoke to pt who states she was recently seen and advised to contact office back with update. Pt states she is feeling a little better but is still experiencing a night time cough. The tessalon has provided some relief, but cough is still waking her up. Rx can be sent [facility]. pls advise

## 2018-07-30 NOTE — Telephone Encounter (Signed)
I am glad she is feeling better and it sound like persistent cough is still consistent with her illness. Continue to push fluids and take benzonate and mucinex.

## 2018-07-30 NOTE — Telephone Encounter (Signed)
Spoke to pt who states she is taking 200mg  of tessalon and she has already been taking Mucinex; which she started before her OV. She states she "feels a whole lot better today" but still has a productive cough

## 2018-08-01 ENCOUNTER — Telehealth: Payer: Self-pay | Admitting: Family Medicine

## 2018-08-01 DIAGNOSIS — E039 Hypothyroidism, unspecified: Secondary | ICD-10-CM

## 2018-08-01 DIAGNOSIS — E786 Lipoprotein deficiency: Secondary | ICD-10-CM

## 2018-08-01 DIAGNOSIS — C549 Malignant neoplasm of corpus uteri, unspecified: Secondary | ICD-10-CM

## 2018-08-01 DIAGNOSIS — E119 Type 2 diabetes mellitus without complications: Secondary | ICD-10-CM

## 2018-08-01 DIAGNOSIS — I10 Essential (primary) hypertension: Secondary | ICD-10-CM

## 2018-08-01 NOTE — Telephone Encounter (Signed)
-----   Message from Lendon Collar, RT sent at 07/26/2018 10:37 AM EST ----- Regarding: Lab orders for Monday 08/02/18 Please enter CPE lab orders for 08/02/18. Thanks!

## 2018-08-02 ENCOUNTER — Telehealth: Payer: Self-pay | Admitting: Family Medicine

## 2018-08-02 ENCOUNTER — Other Ambulatory Visit (INDEPENDENT_AMBULATORY_CARE_PROVIDER_SITE_OTHER): Payer: BLUE CROSS/BLUE SHIELD

## 2018-08-02 ENCOUNTER — Other Ambulatory Visit: Payer: Self-pay | Admitting: Family Medicine

## 2018-08-02 DIAGNOSIS — I10 Essential (primary) hypertension: Secondary | ICD-10-CM | POA: Diagnosis not present

## 2018-08-02 DIAGNOSIS — E039 Hypothyroidism, unspecified: Secondary | ICD-10-CM | POA: Diagnosis not present

## 2018-08-02 DIAGNOSIS — E786 Lipoprotein deficiency: Secondary | ICD-10-CM

## 2018-08-02 DIAGNOSIS — J069 Acute upper respiratory infection, unspecified: Secondary | ICD-10-CM

## 2018-08-02 DIAGNOSIS — E119 Type 2 diabetes mellitus without complications: Secondary | ICD-10-CM

## 2018-08-02 DIAGNOSIS — B9789 Other viral agents as the cause of diseases classified elsewhere: Principal | ICD-10-CM

## 2018-08-02 LAB — COMPREHENSIVE METABOLIC PANEL
ALT: 18 U/L (ref 0–35)
AST: 22 U/L (ref 0–37)
Albumin: 3.8 g/dL (ref 3.5–5.2)
Alkaline Phosphatase: 90 U/L (ref 39–117)
BUN: 20 mg/dL (ref 6–23)
CO2: 26 mEq/L (ref 19–32)
Calcium: 9.4 mg/dL (ref 8.4–10.5)
Chloride: 106 mEq/L (ref 96–112)
Creatinine, Ser: 1.14 mg/dL (ref 0.40–1.20)
GFR: 51.76 mL/min — ABNORMAL LOW (ref >60.00)
Glucose, Bld: 83 mg/dL (ref 70–99)
Potassium: 4.4 mEq/L (ref 3.5–5.1)
Sodium: 141 mEq/L (ref 135–145)
Total Bilirubin: 0.8 mg/dL (ref 0.2–1.2)
Total Protein: 7.6 g/dL (ref 6.0–8.3)

## 2018-08-02 LAB — CBC WITH DIFFERENTIAL/PLATELET
Basophils Absolute: 0.1 10*3/uL (ref 0.0–0.1)
Basophils Relative: 0.6 % (ref 0.0–3.0)
Eosinophils Absolute: 0.2 10*3/uL (ref 0.0–0.7)
Eosinophils Relative: 2.4 % (ref 0.0–5.0)
HCT: 40.3 % (ref 36.0–46.0)
Hemoglobin: 13.6 g/dL (ref 12.0–15.0)
Lymphocytes Relative: 32.5 % (ref 12.0–46.0)
Lymphs Abs: 3.2 10*3/uL (ref 0.7–4.0)
MCHC: 33.7 g/dL (ref 30.0–36.0)
MCV: 90 fl (ref 78.0–100.0)
Monocytes Absolute: 0.7 10*3/uL (ref 0.1–1.0)
Monocytes Relative: 7.2 % (ref 3.0–12.0)
Neutro Abs: 5.6 10*3/uL (ref 1.4–7.7)
Neutrophils Relative %: 57.3 % (ref 43.0–77.0)
Platelets: 262 10*3/uL (ref 150.0–400.0)
RBC: 4.48 Mil/uL (ref 3.87–5.11)
RDW: 13.9 % (ref 11.5–15.5)
WBC: 9.7 10*3/uL (ref 4.0–10.5)

## 2018-08-02 LAB — LIPID PANEL
Cholesterol: 172 mg/dL (ref 0–200)
HDL: 36.7 mg/dL — ABNORMAL LOW (ref >39.00)
LDL Cholesterol: 105 mg/dL — ABNORMAL HIGH (ref 0–99)
NonHDL: 135
Total CHOL/HDL Ratio: 5
Triglycerides: 150 mg/dL — ABNORMAL HIGH (ref 0.0–149.0)
VLDL: 30 mg/dL (ref 0.0–40.0)

## 2018-08-02 LAB — HEMOGLOBIN A1C: Hgb A1c MFr Bld: 6.6 % — ABNORMAL HIGH (ref 4.6–6.5)

## 2018-08-02 LAB — TSH: TSH: 7.75 u[IU]/mL — ABNORMAL HIGH (ref 0.35–4.50)

## 2018-08-02 MED ORDER — HYDROCODONE-HOMATROPINE 5-1.5 MG/5ML PO SYRP: 5.0000 mL | ORAL_SOLUTION | Freq: Three times a day (TID) | ORAL | 0 refills | Status: DC | PRN

## 2018-08-02 NOTE — Telephone Encounter (Signed)
Please call patient and let her know that I have sent in a prescription for a prescription strength cough syrup. If she is not better in a couple of days, please follow up with Dr. Glori Bickers.

## 2018-08-02 NOTE — Telephone Encounter (Signed)
Patient notified as instructed by telephone and verbalized understanding. Patient stated that she already has an appointment scheduled with Dr. Glori Bickers Friday.

## 2018-08-02 NOTE — Telephone Encounter (Signed)
Patient came in for lab work.  Patient was seen be Debbie last Monday.  Patient was given medication for cough and she took a whole box of Mucinex.  Patient said it has gone to her throat and chest and still coughing.  Patient would like to know what to do next.  [facility] uses [facility].

## 2018-08-02 NOTE — Telephone Encounter (Signed)
Please call the patient and ask her the following- fever? Pain? What is she taking for symptoms? ?

## 2018-08-02 NOTE — Telephone Encounter (Signed)
Spoke to patient by telephone and was advised that the benzonate did not help. Patient stated that she took a box of Mucinex and did not help and she is currently taking Tylenol sinus cold and flu which does not help either.

## 2018-08-02 NOTE — Telephone Encounter (Signed)
Please find out if the benzonate helped with her cough? If so, I will send in a refill.

## 2018-08-02 NOTE — Telephone Encounter (Signed)
Spoke to patient by telephone and was advised that she does not have a fever and no pain. Patient stated that she is  coughing all night and  has finished all of the cough medication. Patient stated that the cough is productive at times and yellow in color. Pharmacy [facility]

## 2018-08-06 ENCOUNTER — Ambulatory Visit: Payer: BLUE CROSS/BLUE SHIELD | Admitting: Primary Care

## 2018-08-06 ENCOUNTER — Encounter: Payer: BLUE CROSS/BLUE SHIELD | Admitting: Family Medicine

## 2018-08-06 ENCOUNTER — Encounter: Payer: Self-pay | Admitting: Primary Care

## 2018-08-06 VITALS — BP 134/70 | HR 70 | Temp 98.6°F | Ht 61.25 in | Wt 228.0 lb

## 2018-08-06 DIAGNOSIS — B9789 Other viral agents as the cause of diseases classified elsewhere: Secondary | ICD-10-CM | POA: Diagnosis not present

## 2018-08-06 DIAGNOSIS — J069 Acute upper respiratory infection, unspecified: Secondary | ICD-10-CM

## 2018-08-06 NOTE — Progress Notes (Signed)
Subjective:    Patient ID: Taylor Grimes, female    DOB: 22-May-1961, 58 y.o.   MRN: [id number]  HPI  Taylor Grimes is a 58 year old female with a history of hypertension, DM type 2 who presents today with a chief complaint of hoarse voice.   She was evaluated on 07/26/18 by Jackelyn Poling with a one week history of rhinorrhea, cough. Her exam was consistent for viral URI so she was treated with conservative measures including Tessalon Perles, Advil/Tylenol, saline nasal spray.  She phoned back in on 08/02/18 endorsing continued cough despite Tessalon Perles and Mucinex. She was prescribed Hycodan and told to follow up with PCP if symptoms persisted.  Today she's feeling better, her cough and fatigue have improved. Now she reports voice hoarseness and some cough when talking. Also some post nasal drip. She denies fevers. She is not taking anything else except for what has been prescribed. She denies recent sick contacts.   Review of Systems  Constitutional: Negative for chills, fatigue and fever.  HENT: Positive for postnasal drip. Negative for sinus pressure and sore throat.        Hoarse voice  Respiratory: Positive for cough. Negative for shortness of breath.   Cardiovascular: Negative for chest pain.       Past Medical History:  Diagnosis Date  . Anxiety   . Breast lesion    benign  . Diabetes mellitus without complication ([place])   . Elevated transaminase level    ? fatty liver   . 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  From SGO Guidelines:  Recommended surveillance after treatment of endometrial cancer includes a follow-up visit every 3-6 months for 2 years, then every 6 months for 3 years, and annually thereafter. Each follow-up visit should include a thorough patient history; elicitation and investigation of any new symptoms associated with recurrence, such as vaginal bleeding, pelvic  pain, weight loss, or lethargy; and a thorough speculum, pelvic, and rectovaginal examination  . HTN (hypertension)   . Hypothyroid   . UTI (urinary tract infection)      Social History   Socioeconomic History  . Marital status: Legally Separated    Spouse name: Not on file  . Number of children: 2  . Years of education: Not on file  . Highest education level: Not on file  Occupational History  . Occupation: shift Programme researcher, broadcasting/film/video: [employer]  . Financial resource strain: Not on file  . Food insecurity:    Worry: Not on file    Inability: Not on file  . Transportation needs:    Medical: Not on file    Non-medical: Not on file  Tobacco Use  . Smoking status: Former Smoker    Last attempt to quit: 07/22/1979    Years since quitting: 39.0  . Smokeless tobacco: Never Used  . Tobacco comment: briefly smoked at 16  Substance and Sexual Activity  . Alcohol use: No    Alcohol/week: 0.0 standard drinks    Comment: rarely  . Drug use: No  . Sexual activity: Yes    Birth control/protection: Post-menopausal  Lifestyle  . Physical activity:    Days per week: Not on file    Minutes per session: Not on file  . Stress: Not on file  Relationships  . Social connections:    Talks on phone: Not on file    Gets together:  Not on file    Attends religious service: Not on file    Active member of club or organization: Not on file    Attends meetings of clubs or organizations: Not on file    Relationship status: Not on file  . Intimate partner violence:    Fear of current or ex partner: Not on file    Emotionally abused: Not on file    Physically abused: Not on file    Forced sexual activity: Not on file  Other Topics Concern  . Not on file  Social History Narrative  . Not on file    Past Surgical History:  Procedure Laterality Date  . ABDOMINAL HYSTERECTOMY    . BREAST BIOPSY     benign cyst 2011  . CESAREAN SECTION    . LAPAROSCOPIC TOTAL  HYSTERECTOMY  11/2012    Family History  Problem Relation Age of Onset  . Hypothyroidism Mother   . Alzheimer's disease Mother   . Stroke Father   . Deep vein thrombosis Sister   . Colon cancer Neg Hx   . Esophageal cancer Neg Hx   . Stomach cancer Neg Hx   . Rectal cancer Neg Hx     No Known Allergies  Current Outpatient Medications on File Prior to Visit  Medication Sig Dispense Refill  . atorvastatin (LIPITOR) 10 MG tablet Take 1 tablet (10 mg total) by mouth daily. In evening with a low fat snack 30 tablet 11  . glimepiride (AMARYL) 2 MG tablet Take 1 tablet (2 mg total) by mouth daily before breakfast. 30 tablet 5  . HYDROcodone-homatropine (HYCODAN) 5-1.5 MG/5ML syrup Take 5 mLs by mouth every 8 (eight) hours as needed for cough. 120 mL 0  . levothyroxine (SYNTHROID, LEVOTHROID) 125 MCG tablet TAKE ONE (1) TABLET BY MOUTH EVERY DAY 90 tablet 3  . losartan (COZAAR) 50 MG tablet TAKE ONE (1) TABLET BY MOUTH EVERY DAY 30 tablet 5  . metFORMIN (GLUCOPHAGE) 1000 MG tablet Take 1 tablet (1,000 mg total) by mouth 2 (two) times daily with a meal. 60 tablet 11   No current facility-administered medications on file prior to visit.     BP 134/70   Pulse 70   Temp 98.6 F (37 C) (Oral)   Ht 5' 1.25" (1.556 m)   Wt 228 lb (103.4 kg)   LMP 07/21/2008   SpO2 98%   BMI 42.73 kg/m    Objective:   Physical Exam  Constitutional: She appears well-nourished. She does not appear ill.  HENT:  Right Ear: Tympanic membrane and ear canal normal.  Left Ear: Tympanic membrane and ear canal normal.  Nose: No mucosal edema. Right sinus exhibits no maxillary sinus tenderness and no frontal sinus tenderness. Left sinus exhibits no maxillary sinus tenderness and no frontal sinus tenderness.  Mouth/Throat: Oropharynx is clear and moist.  Neck: Neck supple.  Cardiovascular: Normal rate and regular rhythm.  Respiratory: Effort normal and breath sounds normal. She has no wheezes.  Skin: Skin is  warm and dry.           Assessment & Plan:  Viral URI:  Endorses that she's much improved from 07/26/18, some lingering cough with PND. Exam today with clear lungs, doesn't appear sickly. Suspect more post nasal drip vs post infectious cough to be cause for symptoms. Discussed to start daily antihistamine and Delsym. Continue cough suppressant HS, fluids, rest.  Return precautions provided.  Pleas Koch, NP

## 2018-08-06 NOTE — Patient Instructions (Signed)
Your symptoms are representative of a viral illness which will resolve on its own over time. Our goal is to treat your symptoms in order to aid your body in the healing process and to make you more comfortable.   Start a medication like Allegra, Zyrtec, or Claritin to help with throat drainage causing your voice to be hoarse.  Try Delsym or Robitussin as needed for daytime cough.  Please notify me if you develop persistent fevers of 101, notice increased fatigue or weakness, and/or feel worse.  Increase consumption of water intake and rest.  It was a pleasure meeting you!

## 2018-08-16 ENCOUNTER — Ambulatory Visit: Payer: BLUE CROSS/BLUE SHIELD | Admitting: Podiatry

## 2018-08-17 ENCOUNTER — Encounter: Payer: Self-pay | Admitting: Family Medicine

## 2018-08-17 ENCOUNTER — Ambulatory Visit (INDEPENDENT_AMBULATORY_CARE_PROVIDER_SITE_OTHER): Payer: BLUE CROSS/BLUE SHIELD | Admitting: Family Medicine

## 2018-08-17 VITALS — BP 122/76 | HR 51 | Temp 98.4°F | Ht 61.0 in

## 2018-08-17 DIAGNOSIS — E785 Hyperlipidemia, unspecified: Secondary | ICD-10-CM

## 2018-08-17 DIAGNOSIS — E786 Lipoprotein deficiency: Secondary | ICD-10-CM

## 2018-08-17 DIAGNOSIS — E039 Hypothyroidism, unspecified: Secondary | ICD-10-CM

## 2018-08-17 DIAGNOSIS — E119 Type 2 diabetes mellitus without complications: Secondary | ICD-10-CM | POA: Diagnosis not present

## 2018-08-17 DIAGNOSIS — C549 Malignant neoplasm of corpus uteri, unspecified: Secondary | ICD-10-CM

## 2018-08-17 DIAGNOSIS — E1169 Type 2 diabetes mellitus with other specified complication: Secondary | ICD-10-CM | POA: Insufficient documentation

## 2018-08-17 DIAGNOSIS — Z0000 Encounter for general adult medical examination without abnormal findings: Secondary | ICD-10-CM | POA: Diagnosis not present

## 2018-08-17 DIAGNOSIS — Z1211 Encounter for screening for malignant neoplasm of colon: Secondary | ICD-10-CM

## 2018-08-17 DIAGNOSIS — Z23 Encounter for immunization: Secondary | ICD-10-CM

## 2018-08-17 DIAGNOSIS — Z8542 Personal history of malignant neoplasm of other parts of uterus: Secondary | ICD-10-CM

## 2018-08-17 DIAGNOSIS — I10 Essential (primary) hypertension: Secondary | ICD-10-CM

## 2018-08-17 MED ORDER — METFORMIN HCL 1000 MG PO TABS: 1000.0000 mg | ORAL_TABLET | Freq: Two times a day (BID) | ORAL | 11 refills | Status: DC

## 2018-08-17 MED ORDER — ATORVASTATIN CALCIUM 10 MG PO TABS: 10.0000 mg | ORAL_TABLET | Freq: Every day | ORAL | 11 refills | Status: DC

## 2018-08-17 MED ORDER — LOSARTAN POTASSIUM 50 MG PO TABS: ORAL_TABLET | ORAL | 11 refills | Status: DC

## 2018-08-17 MED ORDER — GLIMEPIRIDE 2 MG PO TABS: 2.0000 mg | ORAL_TABLET | Freq: Every day | ORAL | 11 refills | Status: DC

## 2018-08-17 NOTE — Progress Notes (Signed)
Subjective:    Patient ID: Taylor Grimes, female    DOB: 1960/10/27, 58 y.o.   MRN: [id number]  HPI Here for health maintenance exam and to review chronic medical problems    Working and keeping grand kids a lot   Wt Readings from Last 3 Encounters:  08/06/18 228 lb (103.4 kg)  07/26/18 232 lb (105.2 kg)  03/08/18 227 lb 6.4 oz (103.1 kg)  down 4 lb  She is eating better (using air fryer and stopped greasy foods) Likes to walk for exercise  43.08 kg/m   Gyn care/pap Dr Kennon Rounds  Had total hysterectomy in 2014 for endometrial tumor  Is due for a visit =will schedule   Eye exam -last was 8/19-no retinopathy /[facility]   Flu vaccine-interested Was recently sick   Tetanus shot 10/08- is due but she declines  pna vaccine 4/14  Colonoscopy 6/15 with 5 y recall    Mammogram 11/19 -normal  Self breast exam -no lumps   bp is stable today  No cp or palpitations or headaches or edema  No side effects to medicines  BP Readings from Last 3 Encounters:  08/17/18 122/76  08/06/18 134/70  07/26/18 138/80      DM2 Lab Results  Component Value Date   HGBA1C 6.6 (H) 08/02/2018  overall very well controlled with metoformin and amaryl  Up from 6.4 six mo ago  ARB for renal protection   Lab Results  Component Value Date   CREATININE 1.14 08/02/2018   BUN 20 08/02/2018   NA 141 08/02/2018   K 4.4 08/02/2018   CL 106 08/02/2018   CO2 26 08/02/2018   Lab Results  Component Value Date   ALT 18 08/02/2018   AST 22 08/02/2018   ALKPHOS 90 08/02/2018   BILITOT 0.8 08/02/2018    Lab Results  Component Value Date   WBC 9.7 08/02/2018   HGB 13.6 08/02/2018   HCT 40.3 08/02/2018   MCV 90.0 08/02/2018   PLT 262.0 08/02/2018    Sees Dr Renne Crigler for hypothyroidism Lab Results  Component Value Date   TSH 7.75 (H) 08/02/2018   has f/u planned  Not tired  No missed doses of levothy    H/o low HDL Lab Results  Component Value Date   CHOL 172 08/02/2018   CHOL 158  01/28/2018   CHOL 187 06/17/2017   Lab Results  Component Value Date   HDL 36.70 (L) 08/02/2018   HDL 42.90 01/28/2018   HDL 37.00 (L) 06/17/2017   Lab Results  Component Value Date   LDLCALC 105 (H) 08/02/2018   LDLCALC 84 01/28/2018   LDLCALC 90 06/18/2016   Lab Results  Component Value Date   TRIG 150.0 (H) 08/02/2018   TRIG 157.0 (H) 01/28/2018   TRIG 208.0 (H) 06/17/2017   Lab Results  Component Value Date   CHOLHDL 5 08/02/2018   CHOLHDL 4 01/28/2018   CHOLHDL 5 06/17/2017   Lab Results  Component Value Date   LDLDIRECT 113.0 06/17/2017  taking low dose lipitor  Diet is better  Was sick/less active and HDL went down  LDL up a bit at 105  Drinking lots of water as well  Patient Active Problem List   Diagnosis Date Noted  . Hyperlipidemia associated with type 2 diabetes mellitus ([place]) 08/17/2018  . Hypertrophic toenail 02/02/2018  . Stress reaction 09/12/2015  . Back pain, thoracic 01/23/2015  . Low HDL (under 40) 10/24/2014  . Routine general medical examination at  a health care facility 03/08/2014  . History of endometrial cancer 12/15/2012  . Post-operative state 12/10/2012  . Colon cancer screening 11/01/2012  . DM type 2 (diabetes mellitus, type 2) ([place]) 06/30/2012  . Varicose veins of leg with swelling, left 11/18/2011  . Morbid obesity ([place]) 11/12/2010  . Essential hypertension 07/12/2009  . Hypothyroidism 05/16/2009   Past Medical History:  Diagnosis Date  . Anxiety   . Breast lesion    benign  . Diabetes mellitus without complication ([place])   . Elevated transaminase level    ? fatty liver   . 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  From SGO Guidelines:  Recommended surveillance after treatment of endometrial cancer includes a follow-up visit every 3-6 months for 2 years, then every 6 months for 3 years, and annually thereafter. Each follow-up  visit should include a thorough patient history; elicitation and investigation of any new symptoms associated with recurrence, such as vaginal bleeding, pelvic pain, weight loss, or lethargy; and a thorough speculum, pelvic, and rectovaginal examination  . HTN (hypertension)   . Hypothyroid   . UTI (urinary tract infection)    Past Surgical History:  Procedure Laterality Date  . ABDOMINAL HYSTERECTOMY    . BREAST BIOPSY     benign cyst 2011  . CESAREAN SECTION    . LAPAROSCOPIC TOTAL HYSTERECTOMY  11/2012   Social History   Tobacco Use  . Smoking status: Former Smoker    Last attempt to quit: 07/22/1979    Years since quitting: 39.0  . Smokeless tobacco: Never Used  . Tobacco comment: briefly smoked at 16  Substance Use Topics  . Alcohol use: No    Alcohol/week: 0.0 standard drinks    Comment: rarely  . Drug use: No   Family History  Problem Relation Age of Onset  . Hypothyroidism Mother   . Alzheimer's disease Mother   . Stroke Father   . Deep vein thrombosis Sister   . Colon cancer Neg Hx   . Esophageal cancer Neg Hx   . Stomach cancer Neg Hx   . Rectal cancer Neg Hx    No Known Allergies Current Outpatient Medications on File Prior to Visit  Medication Sig Dispense Refill  . levothyroxine (SYNTHROID, LEVOTHROID) 125 MCG tablet TAKE ONE (1) TABLET BY MOUTH EVERY DAY 90 tablet 3   No current facility-administered medications on file prior to visit.     Review of Systems  Constitutional: Negative for activity change, appetite change, fatigue, fever and unexpected weight change.  HENT: Negative for congestion, ear pain, rhinorrhea, sinus pressure and sore throat.        Recent uri improved   Eyes: Negative for pain, redness and visual disturbance.  Respiratory: Negative for cough, shortness of breath and wheezing.   Cardiovascular: Negative for chest pain and palpitations.  Gastrointestinal: Negative for abdominal pain, blood in stool, constipation and diarrhea.    Endocrine: Negative for polydipsia and polyuria.  Genitourinary: Negative for dysuria, frequency and urgency.  Musculoskeletal: Negative for arthralgias, back pain and myalgias.  Skin: Negative for pallor and rash.  Allergic/Immunologic: Negative for environmental allergies.  Neurological: Negative for dizziness, syncope and headaches.  Hematological: Negative for adenopathy. Does not bruise/bleed easily.  Psychiatric/Behavioral: Negative for decreased concentration and dysphoric mood. The patient is not nervous/anxious.        Objective:   Physical Exam Constitutional:      General: She is not  in acute distress.    Appearance: Normal appearance. She is well-developed. She is obese. She is not ill-appearing.  HENT:     Head: Normocephalic and atraumatic.     Right Ear: Tympanic membrane, ear canal and external ear normal.     Left Ear: Tympanic membrane, ear canal and external ear normal.     Nose: Nose normal.     Mouth/Throat:     Mouth: Mucous membranes are moist.     Pharynx: Oropharynx is clear.  Eyes:     General: No scleral icterus.    Conjunctiva/sclera: Conjunctivae normal.     Pupils: Pupils are equal, round, and reactive to light.  Neck:     Musculoskeletal: Normal range of motion and neck supple.     Thyroid: No thyromegaly.     Vascular: No carotid bruit or JVD.  Cardiovascular:     Rate and Rhythm: Regular rhythm. Bradycardia present.     Pulses: Normal pulses.     Heart sounds: Normal heart sounds. No gallop.   Pulmonary:     Effort: Pulmonary effort is normal. No respiratory distress.     Breath sounds: Normal breath sounds. No wheezing.  Chest:     Chest wall: No tenderness.  Abdominal:     General: Bowel sounds are normal. There is no distension or abdominal bruit.     Palpations: Abdomen is soft. There is no mass.     Tenderness: There is no abdominal tenderness.     Hernia: No hernia is present.  Genitourinary:    Comments: Breast exam: No mass,  nodules, thickening, tenderness, bulging, retraction, inflamation, nipple discharge or skin changes noted.  No axillary or clavicular LA.     Musculoskeletal: Normal range of motion.        General: No tenderness.     Right lower leg: No edema.     Left lower leg: No edema.  Lymphadenopathy:     Cervical: No cervical adenopathy.  Skin:    General: Skin is warm and dry.     Coloration: Skin is not pale.     Findings: No erythema or rash.     Comments: Many sks and tags Fair complexion  Neurological:     General: No focal deficit present.     Mental Status: She is alert.     Cranial Nerves: No cranial nerve deficit.     Motor: No abnormal muscle tone.     Coordination: Coordination normal.     Gait: Gait normal.     Deep Tendon Reflexes: Reflexes are normal and symmetric.  Psychiatric:        Mood and Affect: Mood normal.           Assessment & Plan:   Problem List Items Addressed This Visit      Cardiovascular and Mediastinum   Essential hypertension    bp in fair control at this time  BP Readings from Last 1 Encounters:  08/17/18 122/76   No changes needed Most recent labs reviewed  Disc lifstyle change with low sodium diet and exercise  Enc wt loss       Relevant Medications   atorvastatin (LIPITOR) 10 MG tablet   losartan (COZAAR) 50 MG tablet     Endocrine   Hypothyroidism    For f/u with Dr Cruzita Lederer Lab Results  Component Value Date   TSH 7.75 (H) 08/02/2018    No missed doses of levothy or clinical changes  DM type 2 (diabetes mellitus, type 2) ([facility])    Lab Results  Component Value Date   HGBA1C 6.6 (H) 08/02/2018   Overall fairly stable  amaryl and metformin  Enc wt loss Continues to see podiatry regularly for nail care  On arb for renal protection  On atorvastatin for cholesterol       Relevant Medications   atorvastatin (LIPITOR) 10 MG tablet   glimepiride (AMARYL) 2 MG tablet   metFORMIN (GLUCOPHAGE) 1000 MG tablet   losartan  (COZAAR) 50 MG tablet   Hyperlipidemia associated with type 2 diabetes mellitus ([facility])    Disc goals for lipids and reasons to control them Rev last labs with pt Rev low sat fat diet in detail Takes and tolerates low dose atorvastatin  Enc better diet- LDL 105/ up this time  HDL lower-has been sick Binnie Kand active       Relevant Medications   atorvastatin (LIPITOR) 10 MG tablet   glimepiride (AMARYL) 2 MG tablet   metFORMIN (GLUCOPHAGE) 1000 MG tablet   losartan (COZAAR) 50 MG tablet     Genitourinary   RESOLVED: Malignant neoplasm of corpus uteri, except isthmus ([facility])     Other   Morbid obesity ([facility])    Discussed how this problem influences overall health and the risks it imposes  Reviewed plan for weight loss with lower calorie diet (via better food choices and also portion control or program like weight watchers) and exercise building up to or more than 30 minutes 5 days per week including some aerobic activity         Relevant Medications   glimepiride (AMARYL) 2 MG tablet   metFORMIN (GLUCOPHAGE) 1000 MG tablet   Colon cancer screening    Due for colonoscopy 6/20 Pt is aware and plans to schedule it  Will call if she needs a referral for that       History of endometrial cancer    Doing well s/o hysterectomy  She plans to schedule gyn f/u soon       Routine general medical examination at a health care facility - Primary    Reviewed health habits including diet and exercise and skin cancer prevention Reviewed appropriate screening tests for age  Also reviewed health mt list, fam hx and immunization status , as well as social and family history   See HPI Labs reviewed Flu shot given today  Counseled on habits and exercise for wt loss Mood is good  Colonoscopy due June She will schedule gyn f/u (thinks she is due) Breast exam nl today       Low HDL (under 40)    HDL down a bit Enc exercise when over her cold        Other Visit Diagnoses    Diabetes mellitus  without complication ([facility])   (Chronic)     Relevant Medications   atorvastatin (LIPITOR) 10 MG tablet   glimepiride (AMARYL) 2 MG tablet   metFORMIN (GLUCOPHAGE) 1000 MG tablet   losartan (COZAAR) 50 MG tablet   Need for influenza vaccination       Relevant Orders   Flu Vaccine QUAD 6+ mos PF IM (Fluarix Quad PF) (Completed)

## 2018-08-17 NOTE — Assessment & Plan Note (Signed)
HDL down a bit Enc exercise when over her cold

## 2018-08-17 NOTE — Assessment & Plan Note (Signed)
Lab Results  Component Value Date   HGBA1C 6.6 (H) 08/02/2018   Overall fairly stable  amaryl and metformin  Enc wt loss Continues to see podiatry regularly for nail care  On arb for renal protection  On atorvastatin for cholesterol

## 2018-08-17 NOTE — Assessment & Plan Note (Signed)
Due for colonoscopy 6/20 Pt is aware and plans to schedule it  Will call if she needs a referral for that

## 2018-08-17 NOTE — Assessment & Plan Note (Signed)
Disc goals for lipids and reasons to control them Rev last labs with pt Rev low sat fat diet in detail Takes and tolerates low dose atorvastatin  Enc better diet- LDL 105/ up this time  HDL lower-has been sick Taylor Grimes active

## 2018-08-17 NOTE — Assessment & Plan Note (Signed)
For f/u with Dr Cruzita Lederer Lab Results  Component Value Date   TSH 7.75 (H) 08/02/2018    No missed doses of levothy or clinical changes

## 2018-08-17 NOTE — Assessment & Plan Note (Signed)
bp in fair control at this time  BP Readings from Last 1 Encounters:  08/17/18 122/76   No changes needed Most recent labs reviewed  Disc lifstyle change with low sodium diet and exercise  Enc wt loss

## 2018-08-17 NOTE — Assessment & Plan Note (Signed)
Reviewed health habits including diet and exercise and skin cancer prevention Reviewed appropriate screening tests for age  Also reviewed health mt list, fam hx and immunization status , as well as social and family history   See HPI Labs reviewed Flu shot given today  Counseled on habits and exercise for wt loss Mood is good  Colonoscopy due June She will schedule gyn f/u (thinks she is due) Breast exam nl today

## 2018-08-17 NOTE — Patient Instructions (Addendum)
Don't forget to follow up with your gyn doctor   I think your colonoscopy is due in June  Don't forget to schedule it and if you need a referral just let us know   For cholesterol Avoid red meat/ fried foods/ egg yolks/ fatty breakfast meats/ butter, cheese and high fat dairy/ and shellfish   For diabetes  Try to get most of your carbohydrates from produce (with the exception of white potatoes)  Eat less bread/pasta/rice/snack foods/cereals/sweets and other items from the middle of the grocery store (processed carbs)   Flu shot today   Take care of yourself  Exercise when you feel better Keep working on weight loss

## 2018-08-17 NOTE — Assessment & Plan Note (Signed)
Discussed how this problem influences overall health and the risks it imposes  Reviewed plan for weight loss with lower calorie diet (via better food choices and also portion control or program like weight watchers) and exercise building up to or more than 30 minutes 5 days per week including some aerobic activity    

## 2018-08-17 NOTE — Assessment & Plan Note (Signed)
Doing well s/o hysterectomy  She plans to schedule gyn f/u soon

## 2018-08-26 ENCOUNTER — Encounter: Payer: Self-pay | Admitting: Family Medicine

## 2018-09-13 ENCOUNTER — Ambulatory Visit: Payer: BLUE CROSS/BLUE SHIELD | Admitting: Podiatry

## 2018-09-13 ENCOUNTER — Encounter: Payer: Self-pay | Admitting: Podiatry

## 2018-09-13 DIAGNOSIS — Q828 Other specified congenital malformations of skin: Secondary | ICD-10-CM

## 2018-09-13 DIAGNOSIS — B351 Tinea unguium: Secondary | ICD-10-CM

## 2018-09-13 DIAGNOSIS — E119 Type 2 diabetes mellitus without complications: Secondary | ICD-10-CM

## 2018-09-13 DIAGNOSIS — M79675 Pain in left toe(s): Secondary | ICD-10-CM

## 2018-09-13 DIAGNOSIS — M79674 Pain in right toe(s): Secondary | ICD-10-CM

## 2018-09-13 NOTE — Progress Notes (Signed)
Complaint:  Visit Type: Patient returns to my office for continued preventative foot care services. Complaint: Patient states" my nails have grown long and thick and become painful to walk and wear shoes" Patient has been diagnosed with DM with no foot complications. The patient presents for preventative foot care services. No changes to ROS  Podiatric Exam: Vascular: dorsalis pedis and posterior tibial pulses are palpable bilateral. Capillary return is immediate. Temperature gradient is WNL. Skin turgor WNL  Sensorium: Normal Semmes Weinstein monofilament test. Normal tactile sensation bilaterally. Nail Exam: Pt has thick disfigured discolored nails with subungual debris noted bilateral entire nail hallux through fifth toenails Ulcer Exam: There is no evidence of ulcer or pre-ulcerative changes or infection. Orthopedic Exam: Muscle tone and strength are WNL. No limitations in general ROM. No crepitus or effusions noted. Foot type and digits show no abnormalities. Bony prominences are unremarkable. Skin:  Porokeratosis.  Sub 3rd left foot. No infection or ulcers  Diagnosis:  Onychomycosis, , Pain in right toe, pain in left toes.  Debride porokeratosis left  Treatment & Plan Procedures and Treatment: Consent by patient was obtained for treatment procedures.   Debridement of mycotic and hypertrophic toenails, 1 through 5 bilateral and clearing of subungual debris. No ulceration, no infection noted.  Return Visit-Office Procedure: Patient instructed to return to the office for a follow up visit 3 months for continued evaluation and treatment.    Taylor Grimes 

## 2018-12-16 ENCOUNTER — Encounter: Payer: Self-pay | Admitting: Podiatry

## 2018-12-16 ENCOUNTER — Ambulatory Visit (INDEPENDENT_AMBULATORY_CARE_PROVIDER_SITE_OTHER): Payer: BLUE CROSS/BLUE SHIELD | Admitting: Podiatry

## 2018-12-16 ENCOUNTER — Other Ambulatory Visit: Payer: Self-pay

## 2018-12-16 VITALS — Temp 97.2°F

## 2018-12-16 DIAGNOSIS — Q828 Other specified congenital malformations of skin: Secondary | ICD-10-CM

## 2018-12-16 DIAGNOSIS — M79674 Pain in right toe(s): Secondary | ICD-10-CM

## 2018-12-16 DIAGNOSIS — M79675 Pain in left toe(s): Secondary | ICD-10-CM

## 2018-12-16 DIAGNOSIS — E119 Type 2 diabetes mellitus without complications: Secondary | ICD-10-CM

## 2018-12-16 DIAGNOSIS — B351 Tinea unguium: Secondary | ICD-10-CM

## 2018-12-16 NOTE — Progress Notes (Signed)
Complaint:  Visit Type: Patient returns to my office for continued preventative foot care services. Complaint: Patient states" my nails have grown long and thick and become painful to walk and wear shoes" Patient has been diagnosed with DM with no foot complications. The patient presents for preventative foot care services. No changes to ROS  Podiatric Exam: Vascular: dorsalis pedis and posterior tibial pulses are palpable bilateral. Capillary return is immediate. Temperature gradient is WNL. Skin turgor WNL  Sensorium: Normal Semmes Weinstein monofilament test. Normal tactile sensation bilaterally. Nail Exam: Pt has thick disfigured discolored nails with subungual debris noted bilateral entire nail hallux through fifth toenails Ulcer Exam: There is no evidence of ulcer or pre-ulcerative changes or infection. Orthopedic Exam: Muscle tone and strength are WNL. No limitations in general ROM. No crepitus or effusions noted. Foot type and digits show no abnormalities. Bony prominences are unremarkable. Skin:  Porokeratosis.  Sub 3rd left foot. No infection or ulcers  Diagnosis:  Onychomycosis, , Pain in right toe, pain in left toes.  Debride porokeratosis left  Treatment & Plan Procedures and Treatment: Consent by patient was obtained for treatment procedures.   Debridement of mycotic and hypertrophic toenails, 1 through 5 bilateral and clearing of subungual debris. No ulceration, no infection noted.  Return Visit-Office Procedure: Patient instructed to return to the office for a follow up visit 3 months for continued evaluation and treatment.    Gardiner Barefoot DPM

## 2019-01-01 ENCOUNTER — Other Ambulatory Visit: Payer: Self-pay | Admitting: Family Medicine

## 2019-01-11 ENCOUNTER — Encounter: Payer: Self-pay | Admitting: Gastroenterology

## 2019-02-09 ENCOUNTER — Telehealth: Payer: Self-pay | Admitting: Radiology

## 2019-02-09 NOTE — Telephone Encounter (Signed)
Please let patient know about back lab/ covid screen.

## 2019-02-10 ENCOUNTER — Telehealth: Payer: Self-pay | Admitting: Family Medicine

## 2019-02-10 DIAGNOSIS — I10 Essential (primary) hypertension: Secondary | ICD-10-CM

## 2019-02-10 DIAGNOSIS — E1169 Type 2 diabetes mellitus with other specified complication: Secondary | ICD-10-CM

## 2019-02-10 DIAGNOSIS — E119 Type 2 diabetes mellitus without complications: Secondary | ICD-10-CM

## 2019-02-10 DIAGNOSIS — E785 Hyperlipidemia, unspecified: Secondary | ICD-10-CM

## 2019-02-10 NOTE — Telephone Encounter (Signed)
-----   Message from Ellamae Sia sent at 02/02/2019  3:07 PM EDT ----- Regarding: Lab orders for Friday, 7.24.20 Lab orders for a 6 month follow up appt

## 2019-02-11 ENCOUNTER — Other Ambulatory Visit (INDEPENDENT_AMBULATORY_CARE_PROVIDER_SITE_OTHER): Payer: BC Managed Care – PPO

## 2019-02-11 ENCOUNTER — Other Ambulatory Visit: Payer: Self-pay

## 2019-02-11 DIAGNOSIS — E1169 Type 2 diabetes mellitus with other specified complication: Secondary | ICD-10-CM

## 2019-02-11 DIAGNOSIS — E119 Type 2 diabetes mellitus without complications: Secondary | ICD-10-CM

## 2019-02-11 DIAGNOSIS — E785 Hyperlipidemia, unspecified: Secondary | ICD-10-CM | POA: Diagnosis not present

## 2019-02-11 DIAGNOSIS — I10 Essential (primary) hypertension: Secondary | ICD-10-CM

## 2019-02-11 LAB — COMPREHENSIVE METABOLIC PANEL
ALT: 22 U/L (ref 0–35)
AST: 22 U/L (ref 0–37)
Albumin: 3.9 g/dL (ref 3.5–5.2)
Alkaline Phosphatase: 90 U/L (ref 39–117)
BUN: 17 mg/dL (ref 6–23)
CO2: 31 mEq/L (ref 19–32)
Calcium: 9.8 mg/dL (ref 8.4–10.5)
Chloride: 104 mEq/L (ref 96–112)
Creatinine, Ser: 0.9 mg/dL (ref 0.40–1.20)
GFR: 63.86 mL/min (ref >60.00)
Glucose, Bld: 91 mg/dL (ref 70–99)
Potassium: 4.7 mEq/L (ref 3.5–5.1)
Sodium: 141 mEq/L (ref 135–145)
Total Bilirubin: 0.7 mg/dL (ref 0.2–1.2)
Total Protein: 6.7 g/dL (ref 6.0–8.3)

## 2019-02-11 LAB — LIPID PANEL
Cholesterol: 148 mg/dL (ref 0–200)
HDL: 44.3 mg/dL (ref >39.00)
LDL Cholesterol: 84 mg/dL (ref 0–99)
NonHDL: 104.08
Total CHOL/HDL Ratio: 3
Triglycerides: 102 mg/dL (ref 0.0–149.0)
VLDL: 20.4 mg/dL (ref 0.0–40.0)

## 2019-02-11 LAB — HEMOGLOBIN A1C: Hgb A1c MFr Bld: 6.4 % (ref 4.6–6.5)

## 2019-02-15 ENCOUNTER — Encounter: Payer: Self-pay | Admitting: Family Medicine

## 2019-02-15 ENCOUNTER — Ambulatory Visit (INDEPENDENT_AMBULATORY_CARE_PROVIDER_SITE_OTHER): Payer: BC Managed Care – PPO | Admitting: Family Medicine

## 2019-02-15 ENCOUNTER — Other Ambulatory Visit: Payer: Self-pay

## 2019-02-15 VITALS — BP 122/80 | HR 65 | Temp 98.2°F | Ht 61.0 in | Wt 224.0 lb

## 2019-02-15 DIAGNOSIS — E1169 Type 2 diabetes mellitus with other specified complication: Secondary | ICD-10-CM

## 2019-02-15 DIAGNOSIS — I10 Essential (primary) hypertension: Secondary | ICD-10-CM | POA: Diagnosis not present

## 2019-02-15 DIAGNOSIS — E119 Type 2 diabetes mellitus without complications: Secondary | ICD-10-CM | POA: Diagnosis not present

## 2019-02-15 DIAGNOSIS — F411 Generalized anxiety disorder: Secondary | ICD-10-CM

## 2019-02-15 DIAGNOSIS — E785 Hyperlipidemia, unspecified: Secondary | ICD-10-CM

## 2019-02-15 MED ORDER — BUSPIRONE HCL 15 MG PO TABS: 7.5000 mg | ORAL_TABLET | Freq: Two times a day (BID) | ORAL | 3 refills | Status: DC

## 2019-02-15 MED ORDER — LOSARTAN POTASSIUM 100 MG PO TABS: 50.0000 mg | ORAL_TABLET | Freq: Every day | ORAL | 3 refills | Status: DC

## 2019-02-15 NOTE — Patient Instructions (Addendum)
Drink water and diet drinks No juice   Start using your elliptical !   Work up to at least 30 minutes per day  When it cools down your exercise outside This will help anxiety and weight and diabetes and blood pressure   Start buspar for anxiety  If any side effects-stop it and let me know  We can go up on dose later if needed  Call if you want to get a counselor for stress

## 2019-02-15 NOTE — Assessment & Plan Note (Signed)
LDL improved Disc goals for lipids and reasons to control them Rev last labs with pt Rev low sat fat diet in detail Continue statin and diet

## 2019-02-15 NOTE — Progress Notes (Signed)
Subjective:    Patient ID: Taylor Grimes, female    DOB: 1960/10/26, 58 y.o.   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   Feels jittery inside  Anxious  Tearful  A few weeks PHQ score 12 GAD 13   Sad occasionally  Lost interest  Never been on medicine for anxiety  Never had counseling  No exercise now  -will plan to use elliptical      bp is stable today  Better 2nd check BP: 122/80   No cp or palpitations or headaches or edema  No side effects to medicines  BP Readings from Last 3 Encounters:  02/15/19 (!) 142/84  08/17/18 122/76  08/06/18 134/70     DM2 Lab Results  Component Value Date   HGBA1C 6.4 02/11/2019  drinking juice  Down from 6.6  amaryl and metformin  Arb and statin   Hyperlipidemia Lab Results  Component Value Date   CHOL 148 02/11/2019   CHOL 172 08/02/2018   CHOL 158 01/28/2018   Lab Results  Component Value Date   HDL 44.30 02/11/2019   HDL 36.70 (L) 08/02/2018   HDL 42.90 01/28/2018   Lab Results  Component Value Date   LDLCALC 84 02/11/2019   [place] 105 (H) 08/02/2018   LDLCALC 84 01/28/2018   Lab Results  Component Value Date   TRIG 102.0 02/11/2019   TRIG 150.0 (H) 08/02/2018   TRIG 157.0 (H) 01/28/2018   Lab Results  Component Value Date   CHOLHDL 3 02/11/2019   CHOLHDL 5 08/02/2018   CHOLHDL 4 01/28/2018   Lab Results  Component Value Date   LDLDIRECT 113.0 06/17/2017  statin and diet    Patient Active Problem List   Diagnosis Date Noted   Hyperlipidemia associated with type 2 diabetes mellitus ([place])  08/17/2018   Hypertrophic toenail 02/02/2018   Stress reaction 09/12/2015   Back pain, thoracic 01/23/2015   Low HDL (under 40) 10/24/2014   Routine general medical examination at a health care facility 03/08/2014   History of endometrial cancer 12/15/2012   Post-operative state 12/10/2012   Colon cancer screening 11/01/2012   DM type 2 (diabetes mellitus, type 2) ([place]) 06/30/2012   Varicose veins of leg with swelling, left 11/18/2011   Morbid obesity ([place]) 11/12/2010   Essential hypertension 07/12/2009   Hypothyroidism 05/16/2009   Past Medical History:  Diagnosis Date   Anxiety    Breast lesion    benign   Diabetes mellitus without complication (HCC)    Elevated transaminase level    ? fatty liver    History of endometrial cancer 12/15/2012   FIGO Grade 2 endometrioid adenocarcinoma with squamous differentiation involving the mucosa only.  From SGO Guidelines:  Recommended surveillance after treatment of endometrial cancer includes a follow-up visit every 3-6 months for 2 years, then every 6 months for 3 years, and annually thereafter. Each follow-up visit should include a thorough patient history; elicitation and investigation of any new symptoms  associated with recurrence, such as vaginal bleeding, pelvic pain, weight loss, or lethargy; and a thorough speculum, pelvic, and rectovaginal examination   HTN (hypertension)    Hypothyroid    UTI (urinary tract infection)    Past Surgical History:  Procedure Laterality Date   ABDOMINAL HYSTERECTOMY     BREAST BIOPSY     benign cyst 2011   CESAREAN SECTION     LAPAROSCOPIC TOTAL HYSTERECTOMY  11/2012   Social History   Tobacco Use   Smoking status: Former Smoker    Quit date: 07/22/1979    Years since quitting: 39.5   Smokeless tobacco: Never Used   Tobacco comment: briefly smoked at 73  Substance Use Topics   Alcohol use: No    Alcohol/week: 0.0 standard drinks    Comment: rarely   Drug use: No    Family History  Problem Relation Age of Onset   Hypothyroidism Mother    Alzheimer's disease Mother    Stroke Father    Deep vein thrombosis Sister    Colon cancer Neg Hx    Esophageal cancer Neg Hx    Stomach cancer Neg Hx    Rectal cancer Neg Hx    No Known Allergies Current Outpatient Medications on File Prior to Visit  Medication Sig Dispense Refill   atorvastatin (LIPITOR) 10 MG tablet Take 1 tablet (10 mg total) by mouth daily. In evening with a low fat snack 30 tablet 11   glimepiride (AMARYL) 2 MG tablet Take 1 tablet (2 mg total) by mouth daily before breakfast. 30 tablet 11   levothyroxine (SYNTHROID, LEVOTHROID) 125 MCG tablet TAKE ONE (1) TABLET BY MOUTH EVERY DAY 90 tablet 3   metFORMIN (GLUCOPHAGE) 1000 MG tablet Take 1 tablet (1,000 mg total) by mouth 2 (two) times daily with a meal. 60 tablet 11   No current facility-administered medications on file prior to visit.     Review of Systems  Constitutional: Negative for activity change, appetite change, fatigue, fever and unexpected weight change.  HENT: Negative for congestion, ear pain, rhinorrhea, sinus pressure and sore throat.   Eyes: Negative for pain, redness and visual disturbance.  Respiratory: Negative for cough, shortness of breath and wheezing.   Cardiovascular: Negative for chest pain and palpitations.  Gastrointestinal: Negative for abdominal pain, blood in stool, constipation and diarrhea.  Endocrine: Negative for polydipsia and polyuria.  Genitourinary: Negative for dysuria, frequency and urgency.  Musculoskeletal: Negative for arthralgias, back pain and myalgias.  Skin: Negative for pallor and rash.  Allergic/Immunologic: Negative for environmental allergies.  Neurological: Negative for dizziness, syncope and headaches.  Hematological: Negative for adenopathy. Does not bruise/bleed easily.  Psychiatric/Behavioral: Negative for decreased concentration and dysphoric mood. The patient is  nervous/anxious.        Objective:   Physical Exam Constitutional:      General: She is not in acute distress.    Appearance: Normal appearance. She is well-developed. She is obese. She is not ill-appearing or diaphoretic.  HENT:     Head: Normocephalic and atraumatic.     Mouth/Throat:     Mouth: Mucous membranes are moist.  Eyes:     Conjunctiva/sclera: Conjunctivae normal.     Pupils: Pupils are equal, round, and reactive to light.  Neck:     Musculoskeletal: Normal range of motion and neck supple.     Thyroid: No thyromegaly.     Vascular: No carotid bruit or JVD.  Cardiovascular:     Rate and Rhythm: Normal rate and  regular rhythm.     Pulses: Normal pulses.     Heart sounds: Normal heart sounds. No gallop.   Pulmonary:     Effort: Pulmonary effort is normal. No respiratory distress.     Breath sounds: Normal breath sounds. No wheezing or rales.  Abdominal:     General: Bowel sounds are normal. There is no distension or abdominal bruit.     Palpations: Abdomen is soft. There is no mass.     Tenderness: There is no abdominal tenderness.  Musculoskeletal:     Right lower leg: No edema.     Left lower leg: No edema.  Lymphadenopathy:     Cervical: No cervical adenopathy.  Skin:    General: Skin is warm and dry.     Findings: No rash.  Neurological:     Mental Status: She is alert.     Coordination: Coordination normal.     Deep Tendon Reflexes: Reflexes are normal and symmetric. Reflexes normal.  Psychiatric:        Attention and Perception: She is attentive.        Mood and Affect: Mood is anxious. Affect is tearful.        Speech: Speech normal.        Behavior: Behavior normal.        Cognition and Memory: Cognition and memory normal.     Comments: Tearful at times  Candid about anxiety symptoms             Assessment & Plan:   Problem List Items Addressed This Visit      Cardiovascular and Mediastinum   Essential hypertension    bp in fair  control at this time  BP Readings from Last 1 Encounters:  02/15/19 122/80   No changes needed Most recent labs reviewed  Disc lifstyle change with low sodium diet and exercise        Relevant Medications   losartan (COZAAR) 100 MG tablet     Endocrine   DM type 2 (diabetes mellitus, type 2) ([place]) - Primary    Lab Results  Component Value Date   HGBA1C 6.4 02/11/2019  well controlled Enc to stop drinking juice Continue to work on wt loss  Continue arb/ statin  Continue podiatry appointments for foot care        Relevant Medications   losartan (COZAAR) 100 MG tablet   Hyperlipidemia associated with type 2 diabetes mellitus (HCC)    LDL improved Disc goals for lipids and reasons to control them Rev last labs with pt Rev low sat fat diet in detail Continue statin and diet      Relevant Medications   losartan (COZAAR) 100 MG tablet     Other   Morbid obesity ([place])    Discussed how this problem influences overall health and the risks it imposes  Reviewed plan for weight loss with lower calorie diet (via better food choices and also portion control or program like weight watchers) and exercise building up to or more than 30 minutes 5 days per week including some aerobic activity   enc pt strongly to use her elliptical      Anxiety disorder    Worsened by recent stressors/work/financial  Enc exercise and good self care Declines counseling-enc her to re consider  Trial of buspar 7.5 mg bid -to titrate if needed later Discussed expectations of this medication including time to effectiveness and mechanism of action, also poss of side effects (early and late)- including mental  fuzziness, weight or appetite change, nausea and poss of worse dep or anxiety (even suicidal thoughts)  Pt voiced understanding and will stop med and update if this occurs   F/u 6 mo or earlier if needed       Relevant Medications   busPIRone (BUSPAR) 15 MG tablet

## 2019-02-15 NOTE — Assessment & Plan Note (Signed)
Worsened by recent stressors/work/financial  Enc exercise and good self care Declines counseling-enc her to re consider  Trial of buspar 7.5 mg bid -to titrate if needed later Discussed expectations of this medication including time to effectiveness and mechanism of action, also poss of side effects (early and late)- including mental fuzziness, weight or appetite change, nausea and poss of worse dep or anxiety (even suicidal thoughts)  Pt voiced understanding and will stop med and update if this occurs   F/u 6 mo or earlier if needed

## 2019-02-15 NOTE — Assessment & Plan Note (Signed)
bp in fair control at this time  BP Readings from Last 1 Encounters:  02/15/19 122/80   No changes needed Most recent labs reviewed  Disc lifstyle change with low sodium diet and exercise

## 2019-02-15 NOTE — Assessment & Plan Note (Signed)
Lab Results  Component Value Date   HGBA1C 6.4 02/11/2019  well controlled Enc to stop drinking juice Continue to work on wt loss  Continue arb/ statin  Continue podiatry appointments for foot care

## 2019-02-15 NOTE — Assessment & Plan Note (Signed)
Discussed how this problem influences overall health and the risks it imposes  Reviewed plan for weight loss with lower calorie diet (via better food choices and also portion control or program like weight watchers) and exercise building up to or more than 30 minutes 5 days per week including some aerobic activity   enc pt strongly to use her elliptical

## 2019-03-10 ENCOUNTER — Other Ambulatory Visit: Payer: Self-pay

## 2019-03-10 ENCOUNTER — Encounter: Payer: Self-pay | Admitting: Internal Medicine

## 2019-03-10 ENCOUNTER — Ambulatory Visit: Payer: BC Managed Care – PPO | Admitting: Internal Medicine

## 2019-03-10 VITALS — BP 110/70 | HR 81 | Ht 61.0 in | Wt 224.0 lb

## 2019-03-10 DIAGNOSIS — E039 Hypothyroidism, unspecified: Secondary | ICD-10-CM

## 2019-03-10 LAB — TSH: TSH: 1.07 u[IU]/mL (ref 0.35–4.50)

## 2019-03-10 LAB — T4, FREE: Free T4: 1.45 ng/dL (ref 0.60–1.60)

## 2019-03-10 MED ORDER — LEVOTHYROXINE SODIUM 125 MCG PO TABS: ORAL_TABLET | ORAL | 3 refills | Status: DC

## 2019-03-10 NOTE — Progress Notes (Signed)
Patient ID: Taylor Grimes, female   DOB: [birth year], 58 y.o.   MRN: [id number]   HPI  Taylor Grimes is a 58 y.o.-year-old female, returning for f/u for hypothyroidism, dx 2012. Last visit 1 year ago. Her sister, [name], is also my pt.  Pt is on levothyroxine 125 mcg daily, taken: - in am - fasting - at least 30 min from b'fast - no Fe, MVI, PPIs - + Calcium in the evening - not on Biotin  TFTs reviewed: Lab Results  Component Value Date   TSH 7.75 (H) 08/02/2018   TSH 2.52 03/08/2018   TSH 2.69 02/24/2017   TSH 1.37 06/18/2016   TSH 1.50 01/14/2016   TSH 1.68 09/28/2014   TSH 4.90 (H) 05/04/2014   TSH 4.60 (H) 03/09/2014   TSH 7.25 (H) 12/29/2013   TSH 9.07 (H) 11/03/2013   FREET4 1.01 03/08/2018   FREET4 1.15 02/24/2017   FREET4 1.44 01/14/2016   FREET4 1.60 09/28/2014   FREET4 1.14 05/04/2014   FREET4 1.05 03/09/2014   FREET4 1.14 09/29/2013   FREET4 1.03 08/18/2013   FREET4 0.87 11/12/2010   FREET4 0.73 09/23/2010    Pt denies: - feeling nodules in neck - hoarseness - dysphagia - choking - SOB with lying down  She still wakes up at 3 am to go to work and open [employer] at 4-7 am.   No FH of thyroid cancer. However, all women in her family: hypothyroidism. No FH of thyroid cancer. No h/o radiation tx to head or neck.  No herbal supplements. No Biotin use. No recent steroids use.   ROS: Constitutional: no weight gain/no weight loss, + increased hunger, + fatigue, no subjective hyperthermia, no subjective hypothermia Eyes: no blurry vision, no xerophthalmia ENT: no sore throat, + see HPI Cardiovascular: no CP/no SOB/no palpitations/no leg swelling Respiratory: no cough/no SOB/no wheezing Gastrointestinal: no N/no V/no D/no C/no acid reflux Musculoskeletal: no muscle aches/no joint aches Skin: no rashes, no hair loss Neurological: no tremors/no numbness/no tingling/no dizziness  I reviewed pt's medications, allergies, PMH, social hx, family hx, and  changes were documented in the history of present illness. Otherwise, unchanged from my initial visit note.  Past Medical History:  Diagnosis Date  . Anxiety   . Breast lesion    benign  . Diabetes mellitus without complication ([place])   . Elevated transaminase level    ? fatty liver   . 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  From SGO Guidelines:  Recommended surveillance after treatment of endometrial cancer includes a follow-up visit every 3-6 months for 2 years, then every 6 months for 3 years, and annually thereafter. Each follow-up visit should include a thorough patient history; elicitation and investigation of any new symptoms associated with recurrence, such as vaginal bleeding, pelvic pain, weight loss, or lethargy; and a thorough speculum, pelvic, and rectovaginal examination  . HTN (hypertension)   . Hypothyroid   . UTI (urinary tract infection)    Past Surgical History:  Procedure Laterality Date  . ABDOMINAL HYSTERECTOMY    . BREAST BIOPSY     benign cyst 2011  . CESAREAN SECTION    . LAPAROSCOPIC TOTAL HYSTERECTOMY  11/2012   Social History   Socioeconomic History  . Marital status: Legally Separated    Spouse name: Not on file  . Number of children: 2  . Years of education: Not on file  . Highest education level:  Not on file  Occupational History  . Occupation: shift Programme researcher, broadcasting/film/video: [employer]  . Financial resource strain: Not on file  . Food insecurity    Worry: Not on file    Inability: Not on file  . Transportation needs    Medical: Not on file    Non-medical: Not on file  Tobacco Use  . Smoking status: Former Smoker    Quit date: 07/22/1979    Years since quitting: 39.6  . Smokeless tobacco: Never Used  . Tobacco comment: briefly smoked at 16  Substance and Sexual Activity  . Alcohol use: No    Alcohol/week: 0.0 standard  drinks    Comment: rarely  . Drug use: No  . Sexual activity: Yes    Birth control/protection: Post-menopausal  Lifestyle  . Physical activity    Days per week: Not on file    Minutes per session: Not on file  . Stress: Not on file  Relationships  . Social Herbalist on phone: Not on file    Gets together: Not on file    Attends religious service: Not on file    Active member of club or organization: Not on file    Attends meetings of clubs or organizations: Not on file    Relationship status: Not on file  . Intimate partner violence    Fear of current or ex partner: Not on file    Emotionally abused: Not on file    Physically abused: Not on file    Forced sexual activity: Not on file  Other Topics Concern  . Not on file  Social History Narrative  . Not on file   Current Outpatient Medications on File Prior to Visit  Medication Sig Dispense Refill  . atorvastatin (LIPITOR) 10 MG tablet Take 1 tablet (10 mg total) by mouth daily. In evening with a low fat snack 30 tablet 11  . busPIRone (BUSPAR) 15 MG tablet Take 0.5 tablets (7.5 mg total) by mouth 2 (two) times daily. 90 tablet 3  . glimepiride (AMARYL) 2 MG tablet Take 1 tablet (2 mg total) by mouth daily before breakfast. 30 tablet 11  . levothyroxine (SYNTHROID, LEVOTHROID) 125 MCG tablet TAKE ONE (1) TABLET BY MOUTH EVERY DAY 90 tablet 3  . losartan (COZAAR) 100 MG tablet Take 0.5 tablets (50 mg total) by mouth daily. 45 tablet 3  . metFORMIN (GLUCOPHAGE) 1000 MG tablet Take 1 tablet (1,000 mg total) by mouth 2 (two) times daily with a meal. 60 tablet 11   No current facility-administered medications on file prior to visit.    No Known Allergies Family History  Problem Relation Age of Onset  . Hypothyroidism Mother   . Alzheimer's disease Mother   . Stroke Father   . Deep vein thrombosis Sister   . Colon cancer Neg Hx   . Esophageal cancer Neg Hx   . Stomach cancer Neg Hx   . Rectal cancer Neg Hx      PE: BP 110/70   Pulse 81   Ht 5\' 1"  (1.549 m) Comment: measured  Wt 224 lb (101.6 kg)   LMP 07/21/2008   SpO2 96%   BMI 42.32 kg/m  Wt Readings from Last 3 Encounters:  03/10/19 224 lb (101.6 kg)  02/15/19 224 lb (101.6 kg)  08/06/18 228 lb (103.4 kg)   Constitutional: overweight, in NAD Eyes: PERRLA, EOMI, no exophthalmos ENT: moist mucous membranes, no thyromegaly, no cervical lymphadenopathy  Cardiovascular: RRR, No MRG Respiratory: CTA B Gastrointestinal: abdomen soft, NT, ND, BS+ Musculoskeletal: no deformities, strength intact in all 4 Skin: moist, warm, no rashes Neurological: no tremor with outstretched hands, DTR normal in all 4  ASSESSMENT: 1. Hypothyroidism  PLAN:  1. Patient with history of hypothyroidism, previously over replaced with levothyroxine, now with normal TFTs on the current levothyroxine dose. - latest thyroid labs reviewed with pt >> TSH was elevated in 07/2018: 7.75.  This was checked when she had a URI.  However, I was not aware of this result so she continues on the same levothyroxine dose. We discussed that sickness (URI) may have influenced the results then. - she continues on LT4 125 mcg daily - pt feels more tired and hungry at this viit >> wonders if her Thyroid is still not regulated - we discussed about taking the thyroid hormone every day, with water, >30 minutes before breakfast, separated by >4 hours from acid reflux medications, calcium, iron, multivitamins. Pt. is taking it correctly. - will check thyroid tests today: TSH and fT4 - If labs are abnormal, she will need to return for repeat TFTs in 1.5 months  - time spent with the patient: 15 min, of which >50% was spent in obtaining information about her symptoms, reviewing her previous labs, evaluations, and treatments, counseling her about her condition (please see the discussed topics above), and developing a plan to further investigate and treat it; she had a number of questions  which I addressed.  Needs refills for 3 mo.  Office Visit on 03/10/2019  Component Date Value Ref Range Status  . TSH 03/10/2019 1.07  0.35 - 4.50 uIU/mL Final  . Free T4 03/10/2019 1.45  0.60 - 1.60 ng/dL Final   Comment: Specimens from patients who are undergoing biotin therapy and /or ingesting biotin supplements may contain high levels of biotin.  The higher biotin concentration in these specimens interferes with this Free T4 assay.  Specimens that contain high levels  of biotin may cause false high results for this Free T4 assay.  Please interpret results in light of the total clinical presentation of the patient.     TFTs now perfect.  Would refill her current levothyroxine dose.  Philemon Kingdom, MD PhD [facility]

## 2019-03-10 NOTE — Patient Instructions (Signed)
Please continue Levothyroxine 125 mcg daily.  Take the thyroid hormone every day, with water, at least 30 minutes before breakfast, separated by at least 4 hours from: - acid reflux medications - calcium - iron - multivitamins  Please come back for a follow-up appointment in 1 year and 6 months for labs.

## 2019-03-21 ENCOUNTER — Ambulatory Visit: Payer: BLUE CROSS/BLUE SHIELD | Admitting: Podiatry

## 2019-04-13 ENCOUNTER — Other Ambulatory Visit: Payer: Self-pay | Admitting: Family Medicine

## 2019-04-13 DIAGNOSIS — Z1231 Encounter for screening mammogram for malignant neoplasm of breast: Secondary | ICD-10-CM

## 2019-04-14 ENCOUNTER — Ambulatory Visit: Payer: BC Managed Care – PPO | Admitting: Podiatry

## 2019-04-14 ENCOUNTER — Encounter: Payer: Self-pay | Admitting: Podiatry

## 2019-04-14 ENCOUNTER — Other Ambulatory Visit: Payer: Self-pay

## 2019-04-14 DIAGNOSIS — M79675 Pain in left toe(s): Secondary | ICD-10-CM

## 2019-04-14 DIAGNOSIS — M79674 Pain in right toe(s): Secondary | ICD-10-CM

## 2019-04-14 DIAGNOSIS — B351 Tinea unguium: Secondary | ICD-10-CM | POA: Diagnosis not present

## 2019-04-14 DIAGNOSIS — E119 Type 2 diabetes mellitus without complications: Secondary | ICD-10-CM | POA: Diagnosis not present

## 2019-04-14 NOTE — Progress Notes (Signed)
Complaint:  Visit Type: Patient returns to my office for continued preventative foot care services. Complaint: Patient states" my nails have grown long and thick and become painful to walk and wear shoes" Patient has been diagnosed with DM with no foot complications. The patient presents for preventative foot care services. No changes to ROS  Podiatric Exam: Vascular: dorsalis pedis and posterior tibial pulses are palpable bilateral. Capillary return is immediate. Temperature gradient is WNL. Skin turgor WNL  Sensorium: Normal Semmes Weinstein monofilament test. Normal tactile sensation bilaterally. Nail Exam: Pt has thick disfigured discolored nails with subungual debris noted bilateral entire nail hallux through fifth toenails Ulcer Exam: There is no evidence of ulcer or pre-ulcerative changes or infection. Orthopedic Exam: Muscle tone and strength are WNL. No limitations in general ROM. No crepitus or effusions noted. Foot type and digits show no abnormalities. Bony prominences are unremarkable. Skin:  Porokeratosis asymptomatic.Marland Kitchen No infection or ulcers  Diagnosis:  Onychomycosis, , Pain in right toe, pain in left toes.   Treatment & Plan Procedures and Treatment: Consent by patient was obtained for treatment procedures.   Debridement of mycotic and hypertrophic toenails, 1 through 5 bilateral and clearing of subungual debris. No ulceration, no infection noted.  Return Visit-Office Procedure: Patient instructed to return to the office for a follow up visit 4 months for continued evaluation and treatment.    Gardiner Barefoot DPM

## 2019-05-25 DIAGNOSIS — H9193 Unspecified hearing loss, bilateral: Secondary | ICD-10-CM | POA: Diagnosis not present

## 2019-05-25 DIAGNOSIS — H6123 Impacted cerumen, bilateral: Secondary | ICD-10-CM | POA: Diagnosis not present

## 2019-05-30 ENCOUNTER — Ambulatory Visit
Admission: RE | Admit: 2019-05-30 | Discharge: 2019-05-30 | Disposition: A | Discharge status: Home / Self Care | Payer: BC Managed Care – PPO | Source: Ambulatory Visit | Attending: Family Medicine | Admitting: Family Medicine

## 2019-05-30 ENCOUNTER — Other Ambulatory Visit: Payer: Self-pay

## 2019-05-30 DIAGNOSIS — Z1231 Encounter for screening mammogram for malignant neoplasm of breast: Secondary | ICD-10-CM

## 2019-06-06 ENCOUNTER — Telehealth: Payer: Self-pay

## 2019-06-06 NOTE — Telephone Encounter (Signed)
I will see her then  

## 2019-06-06 NOTE — Telephone Encounter (Signed)
For 1 wk pt has burning sensation in entire rt leg; no back pain, burning sensation comes and goes and only last few minutes and then resolve. No pain, no swelling,no redness in rt leg; no CP or SOB. Offered pt appt today but pt only wants to see Dr Glori Bickers; pt scheduled in office 30' appt on 06/07/19 at 8:30. UC& ED precautions given and pt voiced understanding. FYI to Dr Glori Bickers.

## 2019-06-07 ENCOUNTER — Ambulatory Visit: Payer: BC Managed Care – PPO | Admitting: Family Medicine

## 2019-06-07 ENCOUNTER — Encounter: Payer: Self-pay | Admitting: Family Medicine

## 2019-06-07 ENCOUNTER — Other Ambulatory Visit: Payer: Self-pay

## 2019-06-07 VITALS — BP 128/84 | HR 63 | Temp 97.5°F | Ht 61.0 in | Wt 220.2 lb

## 2019-06-07 DIAGNOSIS — M79604 Pain in right leg: Secondary | ICD-10-CM

## 2019-06-07 NOTE — Patient Instructions (Addendum)
I think you have trochanteric bursitis Also varicose veins  Use your support stockings at work   Start putting ice on the painful area (external hip) whenever you can for up to 10 minutes  Aleve 1-2 pills up to every 12 hours with food is ok for pain and inflammation Try this for 1-2 weeks to settle it down  Try the stretches in the handout I gave you   If not much improved in 2 weeks - call and let me know and we will set you up with a specialist

## 2019-06-07 NOTE — Assessment & Plan Note (Signed)
Enc strongly to loose wt   Pt plans to use her treadmill Also needs to demand breaks at work to eat during her 10 h shift so she can bring some healthier food from home

## 2019-06-07 NOTE — Progress Notes (Signed)
Subjective:    Patient ID: Taylor Grimes, female    DOB: 1961-02-17, 58 y.o.   MRN: [id number]  HPI  Pt presents for burning sensation in R leg Sometimes in L - less than the R   She has soreness to the touch in R outer hip ares   Wt Readings from Last 3 Encounters:  06/07/19 220 lb 4 oz (99.9 kg)  03/10/19 224 lb (101.6 kg)  02/15/19 224 lb (101.6 kg)  down 4 lb  Not exercising - ready to start back again   (has elliptical)-plans to clean up the room it is in  Working long hours   -makes it hard on her  Tries to eat healthy   (she does not get breaks at work) has to skip  occ she eats fast food where she works  41.62 kg/m   BP: 128/84  Pulse Rate: 63  H/o varicose veins  Has support stockings to the knee Does not wear them every day  They do help   Starts in outer hip and burns "all the way down" No swelling in the leg Has regular podiatry appts  No back pain at all   Worse when she is standing and walking Not at all when sitting or lying in bed   Patient Active Problem List   Diagnosis Date Noted  . Right leg pain 06/07/2019  . Hyperlipidemia associated with type 2 diabetes mellitus ([place]) 08/17/2018  . Hypertrophic toenail 02/02/2018  . Anxiety disorder 09/12/2015  . Back pain, thoracic 01/23/2015  . Low HDL (under 40) 10/24/2014  . Routine general medical examination at a health care facility 03/08/2014  . History of endometrial cancer 12/15/2012  . Post-operative state 12/10/2012  . Colon cancer screening 11/01/2012  . DM type 2 (diabetes mellitus, type 2) ([place]) 06/30/2012  . Varicose veins of leg with swelling, left 11/18/2011  . Morbid obesity ([place]) 11/12/2010  . Essential hypertension 07/12/2009  . Hypothyroidism 05/16/2009   Past Medical History:  Diagnosis Date  . Anxiety   . Breast lesion    benign  . Diabetes mellitus without complication ([place])   . Elevated transaminase level    ? fatty liver   . 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  From [facility] Guidelines:  Recommended surveillance after treatment of endometrial cancer includes a follow-up visit every 3-6 months for 2 years, then every 6 months for 3 years, and annually thereafter. Each follow-up visit should include a thorough patient history; elicitation and investigation of any new symptoms associated with recurrence, such as vaginal bleeding, pelvic pain, weight loss, or lethargy; and a thorough speculum, pelvic, and rectovaginal examination  . HTN (hypertension)   . Hypothyroid   . UTI (urinary tract infection)    Past Surgical History:  Procedure Laterality Date  . ABDOMINAL HYSTERECTOMY    . BREAST BIOPSY     benign cyst 2011  . CESAREAN SECTION    . LAPAROSCOPIC TOTAL HYSTERECTOMY  11/2012   Social History   Tobacco Use  . Smoking status: Former Smoker    Quit date: 07/22/1979    Years since quitting: 39.9  . Smokeless tobacco: Never Used  . Tobacco comment: briefly smoked at 16  Substance Use Topics  . Alcohol use: No    Alcohol/week: 0.0 standard drinks    Comment: rarely  . Drug use: No   Family History  Problem Relation Age of Onset  .  Hypothyroidism Mother   . Alzheimer's disease Mother   . Stroke Father   . Deep vein thrombosis Sister   . Colon cancer Neg Hx   . Esophageal cancer Neg Hx   . Stomach cancer Neg Hx   . Rectal cancer Neg Hx    No Known Allergies Current Outpatient Medications on File Prior to Visit  Medication Sig Dispense Refill  . atorvastatin (LIPITOR) 10 MG tablet Take 1 tablet (10 mg total) by mouth daily. In evening with a low fat snack 30 tablet 11  . busPIRone (BUSPAR) 15 MG tablet Take 0.5 tablets (7.5 mg total) by mouth 2 (two) times daily. 90 tablet 3  . glimepiride (AMARYL) 2 MG tablet Take 1 tablet (2 mg total) by mouth daily before breakfast. 30 tablet 11  . levothyroxine (SYNTHROID) 125 MCG tablet TAKE ONE (1) TABLET  BY MOUTH EVERY DAY 90 tablet 3  . losartan (COZAAR) 100 MG tablet Take 0.5 tablets (50 mg total) by mouth daily. 45 tablet 3  . metFORMIN (GLUCOPHAGE) 1000 MG tablet Take 1 tablet (1,000 mg total) by mouth 2 (two) times daily with a meal. 60 tablet 11   No current facility-administered medications on file prior to visit.      Review of Systems  Constitutional: Negative for activity change, appetite change, fatigue, fever and unexpected weight change.  HENT: Negative for congestion, ear pain, rhinorrhea, sinus pressure and sore throat.   Eyes: Negative for pain, redness and visual disturbance.  Respiratory: Negative for cough, shortness of breath and wheezing.   Cardiovascular: Negative for chest pain and palpitations.  Gastrointestinal: Negative for abdominal pain, blood in stool, constipation and diarrhea.  Endocrine: Negative for polydipsia and polyuria.  Genitourinary: Negative for dysuria, frequency and urgency.  Musculoskeletal: Positive for arthralgias. Negative for back pain and myalgias.  Skin: Negative for pallor and rash.  Allergic/Immunologic: Negative for environmental allergies.  Neurological: Negative for dizziness, syncope and headaches.  Hematological: Negative for adenopathy. Does not bruise/bleed easily.  Psychiatric/Behavioral: Negative for decreased concentration and dysphoric mood. The patient is not nervous/anxious.        Objective:   Physical Exam Constitutional:      General: She is not in acute distress.    Appearance: Normal appearance. She is well-developed. She is obese. She is not ill-appearing.  HENT:     Head: Normocephalic and atraumatic.  Eyes:     General: No scleral icterus.    Conjunctiva/sclera: Conjunctivae normal.     Pupils: Pupils are equal, round, and reactive to light.  Neck:     Musculoskeletal: Normal range of motion and neck supple.  Cardiovascular:     Rate and Rhythm: Normal rate and regular rhythm.     Comments: Baseline  compressible varicosities in LLE Pulmonary:     Effort: Pulmonary effort is normal.     Breath sounds: Normal breath sounds. No wheezing or rales.  Abdominal:     General: Bowel sounds are normal. There is no distension.     Palpations: Abdomen is soft.     Tenderness: There is no abdominal tenderness.  Musculoskeletal:        General: Tenderness present.     Lumbar back: She exhibits decreased range of motion, tenderness and spasm. She exhibits no bony tenderness and no edema.     Comments: RLE Tender over greater trochanter-this reprod her pain  No swelling/ erythema or rash  Nl rom of leg and hip  No pain with LS flexion  No  crepitus or pain in groin    Lymphadenopathy:     Cervical: No cervical adenopathy.  Skin:    General: Skin is warm and dry.     Coloration: Skin is not pale.     Findings: No erythema or rash.  Neurological:     Mental Status: She is alert.     Cranial Nerves: No cranial nerve deficit.     Sensory: No sensory deficit.     Motor: No weakness, atrophy or abnormal muscle tone.     Coordination: Coordination normal.     Gait: Gait normal.     Deep Tendon Reflexes: Reflexes are normal and symmetric. Reflexes normal.     Comments: Negative SLR  Psychiatric:        Mood and Affect: Mood normal.           Assessment & Plan:   Problem List Items Addressed This Visit      Other   Morbid obesity ([place])    Enc strongly to loose wt   Pt plans to use her treadmill Also needs to demand breaks at work to eat during her 10 h shift so she can bring some healthier food from home      Right leg pain - Primary    Pain and tenderness over greater trochanter- triggered by standing  Suspect trochanteric bursitis  tx with aleve 1-2 pills bid with food prn for 2 wk  Ice when possible  Also enc her to wear supp hose during work  Given rehab program for this- and reviewed   inst to call if worse or no imp in 2 weeks -would consider specialist ref Also enc wt  loss strongly

## 2019-06-07 NOTE — Assessment & Plan Note (Signed)
Pain and tenderness over greater trochanter- triggered by standing  Suspect trochanteric bursitis  tx with aleve 1-2 pills bid with food prn for 2 wk  Ice when possible  Also enc her to wear supp hose during work  Given rehab program for this- and reviewed   inst to call if worse or no imp in 2 weeks -would consider specialist ref Also enc wt loss strongly

## 2019-08-11 ENCOUNTER — Encounter: Payer: Self-pay | Admitting: Podiatry

## 2019-08-11 ENCOUNTER — Other Ambulatory Visit: Payer: Self-pay

## 2019-08-11 ENCOUNTER — Ambulatory Visit: Payer: BC Managed Care – PPO | Admitting: Podiatry

## 2019-08-11 DIAGNOSIS — E119 Type 2 diabetes mellitus without complications: Secondary | ICD-10-CM | POA: Diagnosis not present

## 2019-08-11 DIAGNOSIS — Q828 Other specified congenital malformations of skin: Secondary | ICD-10-CM

## 2019-08-11 DIAGNOSIS — B351 Tinea unguium: Secondary | ICD-10-CM

## 2019-08-11 DIAGNOSIS — M79674 Pain in right toe(s): Secondary | ICD-10-CM

## 2019-08-11 DIAGNOSIS — M79675 Pain in left toe(s): Secondary | ICD-10-CM | POA: Diagnosis not present

## 2019-08-11 NOTE — Progress Notes (Signed)
Complaint:  Visit Type: Patient returns to my office for continued preventative foot care services. Complaint: Patient states" my nails have grown long and thick and become painful to walk and wear shoes" Patient has been diagnosed with DM with no foot complications. The patient presents for preventative foot care services. No changes to ROS  Podiatric Exam: Vascular: dorsalis pedis and posterior tibial pulses are palpable bilateral. Capillary return is immediate. Temperature gradient is WNL. Skin turgor WNL  Sensorium: Normal Semmes Weinstein monofilament test. Normal tactile sensation bilaterally. Nail Exam: Pt has thick disfigured discolored nails with subungual debris noted bilateral entire nail hallux through fifth toenails Ulcer Exam: There is no evidence of ulcer or pre-ulcerative changes or infection. Orthopedic Exam: Muscle tone and strength are WNL. No limitations in general ROM. No crepitus or effusions noted. Foot type and digits show no abnormalities. Bony prominences are unremarkable. Skin:  Porokeratosis asymptomatic.Marland Kitchen No infection or ulcers.  Porokeratosis sub 1 left foot.  Diagnosis:  Onychomycosis, , Pain in right toe, pain in left toes. Debride porokeratosis.  Treatment & Plan Procedures and Treatment: Consent by patient was obtained for treatment procedures.   Debridement of mycotic and hypertrophic toenails, 1 through 5 bilateral and clearing of subungual debris. No ulceration, no infection noted. Debride callus. Return Visit-Office Procedure: Patient instructed to return to the office for a follow up visit 4 months for continued evaluation and treatment.    Gardiner Barefoot DPM

## 2019-08-17 ENCOUNTER — Telehealth: Payer: Self-pay | Admitting: Family Medicine

## 2019-08-17 DIAGNOSIS — E039 Hypothyroidism, unspecified: Secondary | ICD-10-CM

## 2019-08-17 DIAGNOSIS — E1169 Type 2 diabetes mellitus with other specified complication: Secondary | ICD-10-CM

## 2019-08-17 DIAGNOSIS — I10 Essential (primary) hypertension: Secondary | ICD-10-CM

## 2019-08-17 DIAGNOSIS — E119 Type 2 diabetes mellitus without complications: Secondary | ICD-10-CM

## 2019-08-17 NOTE — Telephone Encounter (Signed)
-----   Message from Ellamae Sia sent at 08/05/2019  2:54 PM EST ----- Regarding: Lab orders for Friday, 1.29.21 Patient is scheduled for CPX labs, please order future labs, Thanks , Karna Christmas

## 2019-08-19 ENCOUNTER — Other Ambulatory Visit (INDEPENDENT_AMBULATORY_CARE_PROVIDER_SITE_OTHER): Payer: Self-pay

## 2019-08-19 ENCOUNTER — Other Ambulatory Visit: Payer: Self-pay

## 2019-08-19 DIAGNOSIS — E1169 Type 2 diabetes mellitus with other specified complication: Secondary | ICD-10-CM

## 2019-08-19 DIAGNOSIS — E119 Type 2 diabetes mellitus without complications: Secondary | ICD-10-CM

## 2019-08-19 DIAGNOSIS — E785 Hyperlipidemia, unspecified: Secondary | ICD-10-CM

## 2019-08-19 DIAGNOSIS — I10 Essential (primary) hypertension: Secondary | ICD-10-CM

## 2019-08-19 LAB — LIPID PANEL
Cholesterol: 177 mg/dL (ref 0–200)
HDL: 40.4 mg/dL (ref >39.00)
NonHDL: 136.21
Total CHOL/HDL Ratio: 4
Triglycerides: 202 mg/dL — ABNORMAL HIGH (ref 0.0–149.0)
VLDL: 40.4 mg/dL — ABNORMAL HIGH (ref 0.0–40.0)

## 2019-08-19 LAB — CBC WITH DIFFERENTIAL/PLATELET
Basophils Absolute: 0 10*3/uL (ref 0.0–0.1)
Basophils Relative: 0.4 % (ref 0.0–3.0)
Eosinophils Absolute: 0.3 10*3/uL (ref 0.0–0.7)
Eosinophils Relative: 3.9 % (ref 0.0–5.0)
HCT: 39.9 % (ref 36.0–46.0)
Hemoglobin: 13.3 g/dL (ref 12.0–15.0)
Lymphocytes Relative: 29.4 % (ref 12.0–46.0)
Lymphs Abs: 2.5 10*3/uL (ref 0.7–4.0)
MCHC: 33.3 g/dL (ref 30.0–36.0)
MCV: 90.8 fl (ref 78.0–100.0)
Monocytes Absolute: 0.6 10*3/uL (ref 0.1–1.0)
Monocytes Relative: 7 % (ref 3.0–12.0)
Neutro Abs: 5.1 10*3/uL (ref 1.4–7.7)
Neutrophils Relative %: 59.3 % (ref 43.0–77.0)
Platelets: 214 10*3/uL (ref 150.0–400.0)
RBC: 4.4 Mil/uL (ref 3.87–5.11)
RDW: 13.8 % (ref 11.5–15.5)
WBC: 8.6 10*3/uL (ref 4.0–10.5)

## 2019-08-19 LAB — COMPREHENSIVE METABOLIC PANEL
ALT: 18 U/L (ref 0–35)
AST: 20 U/L (ref 0–37)
Albumin: 3.9 g/dL (ref 3.5–5.2)
Alkaline Phosphatase: 95 U/L (ref 39–117)
BUN: 21 mg/dL (ref 6–23)
CO2: 28 mEq/L (ref 19–32)
Calcium: 9.3 mg/dL (ref 8.4–10.5)
Chloride: 103 mEq/L (ref 96–112)
Creatinine, Ser: 1 mg/dL (ref 0.40–1.20)
GFR: 56.45 mL/min — ABNORMAL LOW (ref >60.00)
Glucose, Bld: 206 mg/dL — ABNORMAL HIGH (ref 70–99)
Potassium: 4.1 mEq/L (ref 3.5–5.1)
Sodium: 140 mEq/L (ref 135–145)
Total Bilirubin: 0.7 mg/dL (ref 0.2–1.2)
Total Protein: 7.4 g/dL (ref 6.0–8.3)

## 2019-08-19 LAB — HEMOGLOBIN A1C: Hgb A1c MFr Bld: 6.5 % (ref 4.6–6.5)

## 2019-08-19 LAB — LDL CHOLESTEROL, DIRECT: Direct LDL: 111 mg/dL

## 2019-08-19 LAB — TSH: TSH: 3.59 u[IU]/mL (ref 0.35–4.50)

## 2019-08-23 ENCOUNTER — Ambulatory Visit (INDEPENDENT_AMBULATORY_CARE_PROVIDER_SITE_OTHER): Payer: BC Managed Care – PPO | Admitting: Family Medicine

## 2019-08-23 ENCOUNTER — Encounter: Payer: Self-pay | Admitting: Family Medicine

## 2019-08-23 ENCOUNTER — Other Ambulatory Visit: Payer: Self-pay

## 2019-08-23 VITALS — BP 132/76 | HR 76 | Temp 97.6°F | Ht 61.5 in | Wt 220.0 lb

## 2019-08-23 DIAGNOSIS — Z1211 Encounter for screening for malignant neoplasm of colon: Secondary | ICD-10-CM

## 2019-08-23 DIAGNOSIS — E1169 Type 2 diabetes mellitus with other specified complication: Secondary | ICD-10-CM | POA: Diagnosis not present

## 2019-08-23 DIAGNOSIS — I10 Essential (primary) hypertension: Secondary | ICD-10-CM

## 2019-08-23 DIAGNOSIS — E785 Hyperlipidemia, unspecified: Secondary | ICD-10-CM

## 2019-08-23 DIAGNOSIS — Z23 Encounter for immunization: Secondary | ICD-10-CM | POA: Diagnosis not present

## 2019-08-23 DIAGNOSIS — C549 Malignant neoplasm of corpus uteri, unspecified: Secondary | ICD-10-CM

## 2019-08-23 DIAGNOSIS — Z0000 Encounter for general adult medical examination without abnormal findings: Secondary | ICD-10-CM

## 2019-08-23 DIAGNOSIS — E119 Type 2 diabetes mellitus without complications: Secondary | ICD-10-CM

## 2019-08-23 DIAGNOSIS — E039 Hypothyroidism, unspecified: Secondary | ICD-10-CM

## 2019-08-23 MED ORDER — METFORMIN HCL 1000 MG PO TABS: 1000.0000 mg | ORAL_TABLET | Freq: Two times a day (BID) | ORAL | 11 refills | Status: DC

## 2019-08-23 MED ORDER — LOSARTAN POTASSIUM 100 MG PO TABS: 50.0000 mg | ORAL_TABLET | Freq: Every day | ORAL | 3 refills | Status: DC

## 2019-08-23 MED ORDER — GLIMEPIRIDE 2 MG PO TABS: 2.0000 mg | ORAL_TABLET | Freq: Every day | ORAL | 11 refills | Status: DC

## 2019-08-23 MED ORDER — ATORVASTATIN CALCIUM 10 MG PO TABS: 10.0000 mg | ORAL_TABLET | Freq: Every day | ORAL | 11 refills | Status: DC

## 2019-08-23 MED ORDER — BUSPIRONE HCL 15 MG PO TABS: 7.5000 mg | ORAL_TABLET | Freq: Two times a day (BID) | ORAL | 3 refills | Status: DC

## 2019-08-23 NOTE — Assessment & Plan Note (Signed)
Discussed how this problem influences overall health and the risks it imposes  Reviewed plan for weight loss with lower calorie diet (via better food choices and also portion control or program like weight watchers) and exercise building up to or more than 30 minutes 5 days per week including some aerobic activity   Enc pt to keep working on it  Increase exercise frequency if possible

## 2019-08-23 NOTE — Assessment & Plan Note (Signed)
Disc goals for lipids and reasons to control them Rev last labs with pt Rev low sat fat diet in detail  Pt continues atorvastatin and diet  Stable  Enc more exercise to raise HDL

## 2019-08-23 NOTE — Patient Instructions (Addendum)
Call your eye specialist to set up your yearly exam   Try to work up to exercise 5 days per week Keep working hard on weight loss   Immunizations today  Tetanus (Td)  Pneumonia vaccine   Drink water  Keep watching your diet   No change in medicines   Follow up with GYN when it is time

## 2019-08-23 NOTE — Assessment & Plan Note (Addendum)
Per pt- her recall colonoscopy time was extended by GI

## 2019-08-23 NOTE — Assessment & Plan Note (Signed)
Lab Results  Component Value Date   HGBA1C 6.5 08/19/2019   Good control with glimepiride and metformin  No hypoglycemia (makes effort not to skip meals)  Urged to continue wt loss effort Inc exercise to 5 d per week  Good foot care Pt will schedule her own yearly eye exam

## 2019-08-23 NOTE — Assessment & Plan Note (Signed)
Reviewed health habits including diet and exercise and skin cancer prevention Reviewed appropriate screening tests for age  Also reviewed health mt list, fam hx and immunization status , as well as social and family history   See HPI Labs reviewed pna 23 and Td vaccines given  I do recommend covid vaccine for her when available  Enc strongly to work on weight loss  She will schedule routine gyn visit (h/o endomet cancer) Colonoscopy utd (per pt they extended her recall time) inst pt to schedule her annual eye exam

## 2019-08-23 NOTE — Assessment & Plan Note (Signed)
bp in fair control at this time  BP Readings from Last 1 Encounters:  08/23/19 132/76   No changes needed Most recent labs reviewed  Disc lifstyle change with low sodium diet and exercise  She will continue losartan

## 2019-08-23 NOTE — Progress Notes (Signed)
Subjective:    Patient ID: Taylor Grimes, female    DOB: 1960/12/08, 59 y.o.   MRN: [id number]  This visit occurred during the SARS-CoV-2 public health emergency.  Safety protocols were in place, including screening questions prior to the visit, additional usage of staff PPE, and extensive cleaning of exam room while observing appropriate contact time as indicated for disinfecting solutions.    HPI Here for health maintenance exam and to review chronic medical problems    Wt Readings from Last 3 Encounters:  08/23/19 220 lb (99.8 kg)  06/07/19 220 lb 4 oz (99.9 kg)  03/10/19 224 lb (101.6 kg)  lost a little wt  Tries to exercise - 3 days / elliptical  Diet is excellent also /avoids junk food  40.90 kg/m  Hx of total hysterectomy for endometrial ca  Oncology signed off She still sees gyn yearly No gyn c/o  Td 10/08- will update today  Colonoscopy 6/15 with 5 y recall  She did get a note and told she could wait an additional 5 years   Eye exam 10/19 - has not had yet  Will call to schedule that  She did not get a reminder   Mammogram 11/20 Self breast exam - no lumps   pna 23 vaccine 4/14 - will update today   Flu shot 11/20   bp is stable today  No cp or palpitations or headaches or edema  No side effects to medicines  BP Readings from Last 3 Encounters:  08/23/19 132/76  06/07/19 128/84  03/10/19 110/70     Pulse Readings from Last 3 Encounters:  08/23/19 76  06/07/19 63  03/10/19 81   Lab Results  Component Value Date   CREATININE 1.00 08/19/2019   BUN 21 08/19/2019   NA 140 08/19/2019   K 4.1 08/19/2019   CL 103 08/19/2019   CO2 28 08/19/2019    DM2 Lab Results  Component Value Date   HGBA1C 6.5 08/19/2019    Stable metformin and glimepiride  Good control  Arb and statin  Very good diet  Exercise 3 days per week   No complications   Hyperlipidemia Lab Results  Component Value Date   CHOL 177 08/19/2019   CHOL 148 02/11/2019   CHOL  172 08/02/2018   Lab Results  Component Value Date   HDL 40.40 08/19/2019   HDL 44.30 02/11/2019   HDL 36.70 (L) 08/02/2018   Lab Results  Component Value Date   LDLCALC 84 02/11/2019   LDLCALC 105 (H) 08/02/2018   LDLCALC 84 01/28/2018   Lab Results  Component Value Date   TRIG 202.0 (H) 08/19/2019   TRIG 102.0 02/11/2019   TRIG 150.0 (H) 08/02/2018   Lab Results  Component Value Date   CHOLHDL 4 08/19/2019   CHOLHDL 3 02/11/2019   CHOLHDL 5 08/02/2018   Lab Results  Component Value Date   LDLDIRECT 111.0 08/19/2019   LDLDIRECT 113.0 06/17/2017   taking atorvastatin 10 mg  Good diet -no fried foods or red meat  Lots of greens  Some exercise    Hypothyroidism  Pt has no clinical changes No change in energy level/ hair or skin/ edema and no tremor Lab Results  Component Value Date   TSH 3.59 08/19/2019     Lab Results  Component Value Date   WBC 8.6 08/19/2019   HGB 13.3 08/19/2019   HCT 39.9 08/19/2019   MCV 90.8 08/19/2019   PLT 214.0 08/19/2019  Lab Results  Component Value Date   ALT 18 08/19/2019   AST 20 08/19/2019   ALKPHOS 95 08/19/2019   BILITOT 0.7 08/19/2019     Patient Active Problem List   Diagnosis Date Noted  . Right leg pain 06/07/2019  . Hyperlipidemia associated with type 2 diabetes mellitus ([place]) 08/17/2018  . Hypertrophic toenail 02/02/2018  . Anxiety disorder 09/12/2015  . Back pain, thoracic 01/23/2015  . Low HDL (under 40) 10/24/2014  . Routine general medical examination at a health care facility 03/08/2014  . History of endometrial cancer 12/15/2012  . Malignant neoplasm of corpus uteri, except isthmus ([place]) 12/15/2012  . Post-operative state 12/10/2012  . Colon cancer screening 11/01/2012  . DM type 2 (diabetes mellitus, type 2) ([place]) 06/30/2012  . Varicose veins of leg with swelling, left 11/18/2011  . Morbid obesity ([place]) 11/12/2010  . Essential hypertension 07/12/2009  . Hypothyroidism 05/16/2009   Past  Medical History:  Diagnosis Date  . Anxiety   . Breast lesion    benign  . Diabetes mellitus without complication ([place])   . Elevated transaminase level    ? fatty liver   . 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  From SGO Guidelines:  Recommended surveillance after treatment of endometrial cancer includes a follow-up visit every 3-6 months for 2 years, then every 6 months for 3 years, and annually thereafter. Each follow-up visit should include a thorough patient history; elicitation and investigation of any new symptoms associated with recurrence, such as vaginal bleeding, pelvic pain, weight loss, or lethargy; and a thorough speculum, pelvic, and rectovaginal examination  . HTN (hypertension)   . Hypothyroid   . UTI (urinary tract infection)    Past Surgical History:  Procedure Laterality Date  . ABDOMINAL HYSTERECTOMY    . BREAST BIOPSY     benign cyst 2011  . CESAREAN SECTION    . LAPAROSCOPIC TOTAL HYSTERECTOMY  11/2012   Social History   Tobacco Use  . Smoking status: Former Smoker    Quit date: 07/22/1979    Years since quitting: 40.1  . Smokeless tobacco: Never Used  . Tobacco comment: briefly smoked at 16  Substance Use Topics  . Alcohol use: No    Alcohol/week: 0.0 standard drinks    Comment: rarely  . Drug use: No   Family History  Problem Relation Age of Onset  . Hypothyroidism Mother   . Alzheimer's disease Mother   . Stroke Father   . Deep vein thrombosis Sister   . Colon cancer Neg Hx   . Esophageal cancer Neg Hx   . Stomach cancer Neg Hx   . Rectal cancer Neg Hx    No Known Allergies Current Outpatient Medications on File Prior to Visit  Medication Sig Dispense Refill  . levothyroxine (SYNTHROID) 125 MCG tablet TAKE ONE (1) TABLET BY MOUTH EVERY DAY 90 tablet 3   No current facility-administered medications on file prior to visit.    Review of Systems   Constitutional: Negative for activity change, appetite change, fatigue, fever and unexpected weight change.  HENT: Negative for congestion, ear pain, rhinorrhea, sinus pressure and sore throat.   Eyes: Negative for pain, redness and visual disturbance.  Respiratory: Negative for cough, shortness of breath and wheezing.   Cardiovascular: Negative for chest pain and palpitations.  Gastrointestinal: Negative for abdominal pain, blood in stool, constipation and diarrhea.  Endocrine: Negative for polydipsia and polyuria.  Genitourinary: Negative for dysuria, frequency and urgency.  Musculoskeletal: Negative for arthralgias, back pain and myalgias.  Skin: Negative for pallor and rash.  Allergic/Immunologic: Negative for environmental allergies.  Neurological: Negative for dizziness, syncope and headaches.  Hematological: Negative for adenopathy. Does not bruise/bleed easily.  Psychiatric/Behavioral: Negative for decreased concentration and dysphoric mood. The patient is not nervous/anxious.        Objective:   Physical Exam Constitutional:      General: She is not in acute distress.    Appearance: Normal appearance. She is well-developed. She is obese. She is not ill-appearing or diaphoretic.  HENT:     Head: Normocephalic and atraumatic.     Right Ear: Tympanic membrane, ear canal and external ear normal.     Left Ear: Tympanic membrane, ear canal and external ear normal.     Nose: Nose normal. No congestion.     Mouth/Throat:     Mouth: Mucous membranes are moist.     Pharynx: Oropharynx is clear. No posterior oropharyngeal erythema.  Eyes:     General: No scleral icterus.    Extraocular Movements: Extraocular movements intact.     Conjunctiva/sclera: Conjunctivae normal.     Pupils: Pupils are equal, round, and reactive to light.  Neck:     Thyroid: No thyromegaly.     Vascular: No carotid bruit or JVD.  Cardiovascular:     Rate and Rhythm: Normal rate and regular rhythm.      Pulses: Normal pulses.     Heart sounds: Normal heart sounds. No gallop.   Pulmonary:     Effort: Pulmonary effort is normal. No respiratory distress.     Breath sounds: Normal breath sounds. No wheezing.     Comments: Good air exch Chest:     Chest wall: No tenderness.  Abdominal:     General: Bowel sounds are normal. There is no distension or abdominal bruit.     Palpations: Abdomen is soft. There is no mass.     Tenderness: There is no abdominal tenderness.     Hernia: No hernia is present.  Genitourinary:    Comments: Breast exam: No mass, nodules, thickening, tenderness, bulging, retraction, inflamation, nipple discharge or skin changes noted.  No axillary or clavicular LA.     Musculoskeletal:        General: No tenderness. Normal range of motion.     Cervical back: Normal range of motion and neck supple. No rigidity. No muscular tenderness.     Right lower leg: No edema.     Left lower leg: No edema.  Lymphadenopathy:     Cervical: No cervical adenopathy.  Skin:    General: Skin is warm and dry.     Coloration: Skin is not pale.     Findings: No erythema or rash.     Comments: Many SKs and skin tags diffusely   Neurological:     Mental Status: She is alert. Mental status is at baseline.     Cranial Nerves: No cranial nerve deficit.     Motor: No abnormal muscle tone.     Coordination: Coordination normal.     Gait: Gait normal.     Deep Tendon Reflexes: Reflexes are normal and symmetric. Reflexes normal.  Psychiatric:        Mood and Affect: Mood normal.        Cognition and Memory: Cognition and memory normal.     Comments: Good mood  Assessment & Plan:   Problem List Items Addressed This Visit      Cardiovascular and Mediastinum   Essential hypertension    bp in fair control at this time  BP Readings from Last 1 Encounters:  08/23/19 132/76   No changes needed Most recent labs reviewed  Disc lifstyle change with low sodium diet and exercise    She will continue losartan      Relevant Medications   atorvastatin (LIPITOR) 10 MG tablet   losartan (COZAAR) 100 MG tablet     Endocrine   Hypothyroidism   DM type 2 (diabetes mellitus, type 2) ([place])    Lab Results  Component Value Date   HGBA1C 6.5 08/19/2019   Good control with glimepiride and metformin  No hypoglycemia (makes effort not to skip meals)  Urged to continue wt loss effort Inc exercise to 5 d per week  Good foot care Pt will schedule her own yearly eye exam        Relevant Medications   atorvastatin (LIPITOR) 10 MG tablet   glimepiride (AMARYL) 2 MG tablet   losartan (COZAAR) 100 MG tablet   metFORMIN (GLUCOPHAGE) 1000 MG tablet   Hyperlipidemia associated with type 2 diabetes mellitus ([facility])    Disc goals for lipids and reasons to control them Rev last labs with pt Rev low sat fat diet in detail  Pt continues atorvastatin and diet  Stable  Enc more exercise to raise HDL       Relevant Medications   atorvastatin (LIPITOR) 10 MG tablet   glimepiride (AMARYL) 2 MG tablet   losartan (COZAAR) 100 MG tablet   metFORMIN (GLUCOPHAGE) 1000 MG tablet     Genitourinary   RESOLVED: Malignant neoplasm of corpus uteri, except isthmus ([facility])     Other   Morbid obesity ([facility])    Discussed how this problem influences overall health and the risks it imposes  Reviewed plan for weight loss with lower calorie diet (via better food choices and also portion control or program like weight watchers) and exercise building up to or more than 30 minutes 5 days per week including some aerobic activity   Enc pt to keep working on it  Increase exercise frequency if possible      Relevant Medications   glimepiride (AMARYL) 2 MG tablet   metFORMIN (GLUCOPHAGE) 1000 MG tablet   Colon cancer screening    Per pt- her recall colonoscopy time was extended by GI      Routine general medical examination at a health care facility - Primary    Reviewed health habits including  diet and exercise and skin cancer prevention Reviewed appropriate screening tests for age  Also reviewed health mt list, fam hx and immunization status , as well as social and family history   See HPI Labs reviewed pna 23 and Td vaccines given  I do recommend covid vaccine for her when available  Enc strongly to work on weight loss  She will schedule routine gyn visit (h/o endomet cancer) Colonoscopy utd (per pt they extended her recall time) inst pt to schedule her annual eye exam        Other Visit Diagnoses    Need for Td vaccine       Relevant Orders   Td : Tetanus/diphtheria >7yo Preservative  free (Completed)   Need for 23-polyvalent pneumococcal polysaccharide vaccine       Relevant Orders   Pneumococcal polysaccharide vaccine 23-valent greater than or equal to 2yo subcutaneous/IM (  Completed)

## 2019-10-05 DIAGNOSIS — H524 Presbyopia: Secondary | ICD-10-CM | POA: Diagnosis not present

## 2019-10-05 LAB — HM DIABETES EYE EXAM

## 2019-10-06 ENCOUNTER — Encounter: Payer: Self-pay | Admitting: Family Medicine

## 2019-10-11 DIAGNOSIS — H5213 Myopia, bilateral: Secondary | ICD-10-CM | POA: Diagnosis not present

## 2019-12-15 ENCOUNTER — Ambulatory Visit: Payer: BC Managed Care – PPO | Admitting: Podiatry

## 2019-12-15 ENCOUNTER — Other Ambulatory Visit: Payer: Self-pay

## 2019-12-15 ENCOUNTER — Encounter: Payer: Self-pay | Admitting: Podiatry

## 2019-12-15 DIAGNOSIS — I83892 Varicose veins of left lower extremities with other complications: Secondary | ICD-10-CM | POA: Diagnosis not present

## 2019-12-15 DIAGNOSIS — E119 Type 2 diabetes mellitus without complications: Secondary | ICD-10-CM | POA: Diagnosis not present

## 2019-12-15 DIAGNOSIS — M79674 Pain in right toe(s): Secondary | ICD-10-CM

## 2019-12-15 DIAGNOSIS — M79675 Pain in left toe(s): Secondary | ICD-10-CM | POA: Diagnosis not present

## 2019-12-15 DIAGNOSIS — Q828 Other specified congenital malformations of skin: Secondary | ICD-10-CM

## 2019-12-15 DIAGNOSIS — B351 Tinea unguium: Secondary | ICD-10-CM

## 2019-12-15 NOTE — Progress Notes (Signed)
Complaint:  Visit Type: Patient returns to my office for continued preventative foot care services. Complaint: Patient states" my nails have grown long and thick and become painful to walk and wear shoes" Patient has been diagnosed with DM with no foot complications. The patient presents for preventative foot care services. No changes to ROS  Podiatric Exam: Vascular: dorsalis pedis and posterior tibial pulses are palpable bilateral. Capillary return is immediate. Temperature gradient is WNL. Skin turgor WNL  Sensorium: Normal Semmes Weinstein monofilament test. Normal tactile sensation bilaterally. Nail Exam: Pt has thick disfigured discolored nails with subungual debris noted bilateral entire nail hallux through fifth toenails Ulcer Exam: There is no evidence of ulcer or pre-ulcerative changes or infection. Orthopedic Exam: Muscle tone and strength are WNL. No limitations in general ROM. No crepitus or effusions noted. Foot type and digits show no abnormalities. Bony prominences are unremarkable. Skin:  Porokeratosis asymptomatic.Marland Kitchen No infection or ulcers.  Porokeratosis sub 4 left foot.  Diagnosis:  Onychomycosis, , Pain in right toe, pain in left toes. porokeratosis.  Treatment & Plan Procedures and Treatment: Consent by patient was obtained for treatment procedures.   Debridement of mycotic and hypertrophic toenails, 1 through 5 bilateral and clearing of subungual debris. With nail nipper and dremel tool. No ulceration, no infection noted. Debride callus. With # 15 blade. Return Visit-Office Procedure: Patient instructed to return to the office for a follow up visit 4 months for continued evaluation and treatment.    Gardiner Barefoot DPM

## 2020-02-23 ENCOUNTER — Ambulatory Visit: Payer: BC Managed Care – PPO | Admitting: Family Medicine

## 2020-02-23 ENCOUNTER — Other Ambulatory Visit: Payer: Self-pay

## 2020-02-23 ENCOUNTER — Encounter: Payer: Self-pay | Admitting: Family Medicine

## 2020-02-23 VITALS — BP 104/72 | HR 99 | Temp 97.8°F | Ht 63.5 in | Wt 224.2 lb

## 2020-02-23 DIAGNOSIS — R3915 Urgency of urination: Secondary | ICD-10-CM | POA: Diagnosis not present

## 2020-02-23 DIAGNOSIS — N3000 Acute cystitis without hematuria: Secondary | ICD-10-CM | POA: Diagnosis not present

## 2020-02-23 DIAGNOSIS — N390 Urinary tract infection, site not specified: Secondary | ICD-10-CM | POA: Diagnosis not present

## 2020-02-23 LAB — POC URINALSYSI DIPSTICK (AUTOMATED)
Blood, UA: NEGATIVE
Glucose, UA: NEGATIVE
Nitrite, UA: POSITIVE
Protein, UA: POSITIVE — AB
Spec Grav, UA: >=1.03 — AB (ref 1.010–1.025)
Urobilinogen, UA: 1 E.U./dL
pH, UA: 5.5 (ref 5.0–8.0)

## 2020-02-23 MED ORDER — SULFAMETHOXAZOLE-TRIMETHOPRIM 800-160 MG PO TABS: 1.0000 | ORAL_TABLET | Freq: Two times a day (BID) | ORAL | 0 refills | Status: DC

## 2020-02-23 NOTE — Progress Notes (Signed)
Chief Complaint  Patient presents with  . Urinary Urgency  . Back Pain    History of Present Illness: Urinary Frequency  This is a new problem. The current episode started 1 to 4 weeks ago. The problem has been waxing and waning. The quality of the pain is described as burning. The patient is experiencing no pain. There has been no fever. Associated symptoms include frequency, nausea and urgency. Pertinent negatives include no chills, flank pain, hematuria or vomiting. Associated symptoms comments: Low back pain. She has tried NSAIDs and increased fluids for the symptoms. The treatment provided mild relief. There is no history of catheterization, kidney stones, recurrent UTIs, a single kidney, urinary stasis or a urological procedure. no recent antibitoics or UTIs      This visit occurred during the SARS-CoV-2 public health emergency.  Safety protocols were in place, including screening questions prior to the visit, additional usage of staff PPE, and extensive cleaning of exam room while observing appropriate contact time as indicated for disinfecting solutions.   COVID 19 screen:  No recent travel or known exposure to COVID19 The patient denies respiratory symptoms of COVID 19 at this time. The importance of social distancing was discussed today.     Review of Systems  Constitutional: Negative for chills.  Gastrointestinal: Positive for nausea. Negative for vomiting.  Genitourinary: Positive for frequency and urgency. Negative for flank pain and hematuria.      Past Medical History:  Diagnosis Date  . Anxiety   . Breast lesion    benign  . Diabetes mellitus without complication ([place])   . Elevated transaminase level    ? fatty liver   . 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  From SGO Guidelines:  Recommended surveillance after treatment of endometrial cancer includes a follow-up  visit every 3-6 months for 2 years, then every 6 months for 3 years, and annually thereafter. Each follow-up visit should include a thorough patient history; elicitation and investigation of any new symptoms associated with recurrence, such as vaginal bleeding, pelvic pain, weight loss, or lethargy; and a thorough speculum, pelvic, and rectovaginal examination  . HTN (hypertension)   . Hypothyroid   . UTI (urinary tract infection)     reports that she quit smoking about 40 years ago. She has never used smokeless tobacco. She reports that she does not drink alcohol and does not use drugs.   Current Outpatient Medications:  .  atorvastatin (LIPITOR) 10 MG tablet, Take 1 tablet (10 mg total) by mouth daily. In evening with a low fat snack, Disp: 30 tablet, Rfl: 11 .  busPIRone (BUSPAR) 15 MG tablet, Take 0.5 tablets (7.5 mg total) by mouth 2 (two) times daily., Disp: 90 tablet, Rfl: 3 .  glimepiride (AMARYL) 2 MG tablet, Take 1 tablet (2 mg total) by mouth daily before breakfast., Disp: 30 tablet, Rfl: 11 .  levothyroxine (SYNTHROID) 125 MCG tablet, TAKE ONE (1) TABLET BY MOUTH EVERY DAY, Disp: 90 tablet, Rfl: 3 .  losartan (COZAAR) 100 MG tablet, Take 0.5 tablets (50 mg total) by mouth daily., Disp: 45 tablet, Rfl: 3 .  metFORMIN (GLUCOPHAGE) 1000 MG tablet, Take 1 tablet (1,000 mg total) by mouth 2 (two) times daily with a meal., Disp: 60 tablet, Rfl: 11   Observations/Objective: Blood pressure 104/72, pulse 99, temperature 97.8 F (36.6 C), temperature source Temporal, height 5' 3.5" (1.613 m), weight 224 lb 4 oz (101.7 kg), last  menstrual period 07/21/2008, SpO2 97 %.    Lab Results  Component Value Date   HGBA1C 6.5 08/19/2019    Physical Exam Constitutional:      General: She is not in acute distress.    Appearance: Normal appearance. She is well-developed. She is not ill-appearing or toxic-appearing.  HENT:     Head: Normocephalic.     Right Ear: Hearing, tympanic membrane, ear  canal and external ear normal. Tympanic membrane is not erythematous, retracted or bulging.     Left Ear: Hearing, tympanic membrane, ear canal and external ear normal. Tympanic membrane is not erythematous, retracted or bulging.     Nose: No mucosal edema or rhinorrhea.     Right Sinus: No maxillary sinus tenderness or frontal sinus tenderness.     Left Sinus: No maxillary sinus tenderness or frontal sinus tenderness.     Mouth/Throat:     Pharynx: Uvula midline.  Eyes:     General: Lids are normal. Lids are everted, no foreign bodies appreciated.     Conjunctiva/sclera: Conjunctivae normal.     Pupils: Pupils are equal, round, and reactive to light.  Neck:     Thyroid: No thyroid mass or thyromegaly.     Vascular: No carotid bruit.     Trachea: Trachea normal.  Cardiovascular:     Rate and Rhythm: Normal rate and regular rhythm.     Pulses: Normal pulses.     Heart sounds: Normal heart sounds, S1 normal and S2 normal. No murmur heard.  No friction rub. No gallop.   Pulmonary:     Effort: Pulmonary effort is normal. No tachypnea or respiratory distress.     Breath sounds: Normal breath sounds. No decreased breath sounds, wheezing, rhonchi or rales.  Abdominal:     General: Bowel sounds are normal.     Palpations: Abdomen is soft.     Tenderness: There is abdominal tenderness in the right lower quadrant and suprapubic area. There is no right CVA tenderness or left CVA tenderness.  Musculoskeletal:     Cervical back: Normal range of motion and neck supple.  Skin:    General: Skin is warm and dry.     Findings: No rash.  Neurological:     Mental Status: She is alert.  Psychiatric:        Mood and Affect: Mood is not anxious or depressed.        Speech: Speech normal.        Behavior: Behavior normal. Behavior is cooperative.        Thought Content: Thought content normal.        Judgment: Judgment normal.      Assessment and Plan   Acute cystitis without hematuria UA  positive and typical symptoms.. send for culture. Treat with bactrim x 3 days  Increase hydration given ketones and specific gravity.      Eliezer Lofts, MD

## 2020-02-23 NOTE — Assessment & Plan Note (Signed)
UA positive and typical symptoms.. send for culture. Treat with bactrim x 3 days  Increase hydration given ketones and specific gravity.

## 2020-02-23 NOTE — Patient Instructions (Signed)
Complete antibiotics.  We will call with culture results.   Urinary Tract Infection, Adult A urinary tract infection (UTI) is an infection of any part of the urinary tract. The urinary tract includes:  The kidneys.  The ureters.  The bladder.  The urethra. These organs make, store, and get rid of pee (urine) in the body. What are the causes? This is caused by germs (bacteria) in your genital area. These germs grow and cause swelling (inflammation) of your urinary tract. What increases the risk? You are more likely to develop this condition if:  You have a small, thin tube (catheter) to drain pee.  You cannot control when you pee or poop (incontinence).  You are female, and: ? You use these methods to prevent pregnancy:  A medicine that kills sperm (spermicide).  A device that blocks sperm (diaphragm). ? You have low levels of a female hormone (estrogen). ? You are pregnant.  You have genes that add to your risk.  You are sexually active.  You take antibiotic medicines.  You have trouble peeing because of: ? A prostate that is bigger than normal, if you are female. ? A blockage in the part of your body that drains pee from the bladder (urethra). ? A kidney stone. ? A nerve condition that affects your bladder (neurogenic bladder). ? Not getting enough to drink. ? Not peeing often enough.  You have other conditions, such as: ? Diabetes. ? A weak disease-fighting system (immune system). ? Sickle cell disease. ? Gout. ? Injury of the spine. What are the signs or symptoms? Symptoms of this condition include:  Needing to pee right away (urgently).  Peeing often.  Peeing small amounts often.  Pain or burning when peeing.  Blood in the pee.  Pee that smells bad or not like normal.  Trouble peeing.  Pee that is cloudy.  Fluid coming from the vagina, if you are female.  Pain in the belly or lower back. Other symptoms include:  Throwing up  (vomiting).  No urge to eat.  Feeling mixed up (confused).  Being tired and grouchy (irritable).  A fever.  Watery poop (diarrhea). How is this treated? This condition may be treated with:  Antibiotic medicine.  Other medicines.  Drinking enough water. Follow these instructions at home:  Medicines  Take over-the-counter and prescription medicines only as told by your doctor.  If you were prescribed an antibiotic medicine, take it as told by your doctor. Do not stop taking it even if you start to feel better. General instructions  Make sure you: ? Pee until your bladder is empty. ? Do not hold pee for a long time. ? Empty your bladder after sex. ? Wipe from front to back after pooping if you are a female. Use each tissue one time when you wipe.  Drink enough fluid to keep your pee pale yellow.  Keep all follow-up visits as told by your doctor. This is important. Contact a doctor if:  You do not get better after 1-2 days.  Your symptoms go away and then come back. Get help right away if:  You have very bad back pain.  You have very bad pain in your lower belly.  You have a fever.  You are sick to your stomach (nauseous).  You are throwing up. Summary  A urinary tract infection (UTI) is an infection of any part of the urinary tract.  This condition is caused by germs in your genital area.  There are many  risk factors for a UTI. These include having a small, thin tube to drain pee and not being able to control when you pee or poop.  Treatment includes antibiotic medicines for germs.  Drink enough fluid to keep your pee pale yellow. This information is not intended to replace advice given to you by your health care provider. Make sure you discuss any questions you have with your health care provider. Document Revised: 06/24/2018 Document Reviewed: 01/14/2018 Elsevier Patient Education  2020 [employer].

## 2020-02-25 LAB — URINE CULTURE
MICRO NUMBER:: 10791980
SPECIMEN QUALITY:: ADEQUATE

## 2020-03-12 ENCOUNTER — Ambulatory Visit: Payer: Self-pay | Admitting: Internal Medicine

## 2020-04-18 ENCOUNTER — Ambulatory Visit: Payer: Self-pay | Admitting: Internal Medicine

## 2020-04-19 ENCOUNTER — Ambulatory Visit: Payer: BC Managed Care – PPO | Admitting: Podiatry

## 2020-04-19 ENCOUNTER — Encounter: Payer: Self-pay | Admitting: Podiatry

## 2020-04-19 ENCOUNTER — Other Ambulatory Visit: Payer: Self-pay

## 2020-04-19 DIAGNOSIS — M79674 Pain in right toe(s): Secondary | ICD-10-CM | POA: Diagnosis not present

## 2020-04-19 DIAGNOSIS — Q828 Other specified congenital malformations of skin: Secondary | ICD-10-CM

## 2020-04-19 DIAGNOSIS — E119 Type 2 diabetes mellitus without complications: Secondary | ICD-10-CM

## 2020-04-19 DIAGNOSIS — B351 Tinea unguium: Secondary | ICD-10-CM

## 2020-04-19 DIAGNOSIS — M79675 Pain in left toe(s): Secondary | ICD-10-CM

## 2020-04-19 DIAGNOSIS — I83892 Varicose veins of left lower extremities with other complications: Secondary | ICD-10-CM

## 2020-04-19 NOTE — Progress Notes (Signed)
Complaint:  Visit Type: Patient returns to my office for continued preventative foot care services. Complaint: Patient states" my nails have grown long and thick and become painful to walk and wear shoes" Patient has been diagnosed with DM with no foot complications. The patient presents for preventative foot care services. No changes to ROS  Podiatric Exam: Vascular: dorsalis pedis and posterior tibial pulses are palpable bilateral. Capillary return is immediate. Temperature gradient is WNL. Skin turgor WNL  Sensorium: Normal Semmes Weinstein monofilament test. Normal tactile sensation bilaterally. Nail Exam: Pt has thick disfigured discolored nails with subungual debris noted bilateral entire nail hallux through fifth toenails Ulcer Exam: There is no evidence of ulcer or pre-ulcerative changes or infection. Orthopedic Exam: Muscle tone and strength are WNL. No limitations in general ROM. No crepitus or effusions noted. Foot type and digits show no abnormalities. Bony prominences are unremarkable. Skin:  Porokeratosis symptomatic.Marland Kitchen No infection or ulcers.  Porokeratosis sub 4 left foot.  Diagnosis:  Onychomycosis, , Pain in right toe, pain in left toes. porokeratosis.  Treatment & Plan Procedures and Treatment: Consent by patient was obtained for treatment procedures.   Debridement of mycotic and hypertrophic toenails, 1 through 5 bilateral and clearing of subungual debris. With nail nipper and dremel tool. No ulceration, no infection noted. Debride callus. With # 15 blade. Return Visit-Office Procedure: Patient instructed to return to the office for a follow up visit 4 months for continued evaluation and treatment.    Gardiner Barefoot DPM

## 2020-05-05 ENCOUNTER — Other Ambulatory Visit: Payer: Self-pay | Admitting: Internal Medicine

## 2020-05-07 ENCOUNTER — Other Ambulatory Visit: Payer: Self-pay | Admitting: Family Medicine

## 2020-05-07 DIAGNOSIS — Z1231 Encounter for screening mammogram for malignant neoplasm of breast: Secondary | ICD-10-CM

## 2020-06-07 ENCOUNTER — Ambulatory Visit
Admission: RE | Admit: 2020-06-07 | Discharge: 2020-06-07 | Disposition: A | Discharge status: Home / Self Care | Payer: BC Managed Care – PPO | Source: Ambulatory Visit | Attending: Family Medicine | Admitting: Family Medicine

## 2020-06-07 ENCOUNTER — Ambulatory Visit: Payer: BC Managed Care – PPO

## 2020-06-07 ENCOUNTER — Other Ambulatory Visit: Payer: Self-pay

## 2020-06-07 DIAGNOSIS — Z1231 Encounter for screening mammogram for malignant neoplasm of breast: Secondary | ICD-10-CM

## 2020-06-21 ENCOUNTER — Other Ambulatory Visit: Payer: Self-pay

## 2020-06-21 ENCOUNTER — Ambulatory Visit: Payer: BC Managed Care – PPO | Admitting: Internal Medicine

## 2020-06-21 ENCOUNTER — Encounter: Payer: Self-pay | Admitting: Internal Medicine

## 2020-06-21 VITALS — BP 138/88 | HR 67 | Ht 63.0 in | Wt 226.0 lb

## 2020-06-21 DIAGNOSIS — Z23 Encounter for immunization: Secondary | ICD-10-CM | POA: Diagnosis not present

## 2020-06-21 DIAGNOSIS — E039 Hypothyroidism, unspecified: Secondary | ICD-10-CM

## 2020-06-21 LAB — T4, FREE: Free T4: 1.25 ng/dL (ref 0.60–1.60)

## 2020-06-21 LAB — TSH: TSH: 0.52 u[IU]/mL (ref 0.35–4.50)

## 2020-06-21 NOTE — Patient Instructions (Signed)
Please continue Levothyroxine 125 mcg daily.  Take the thyroid hormone every day, with water, at least 30 minutes before breakfast, separated by at least 4 hours from: - acid reflux medications - calcium - iron - multivitamins  Please stop at the lab.  Please come back for a follow-up appointment in 1 year.

## 2020-06-21 NOTE — Progress Notes (Signed)
Patient ID: Taylor Grimes, female   DOB: 10/30/1960, 59 y.o.   MRN: [id number]  This visit occurred during the SARS-CoV-2 public health emergency.  Safety protocols were in place, including screening questions prior to the visit, additional usage of staff PPE, and extensive cleaning of exam room while observing appropriate contact time as indicated for disinfecting solutions.   HPI  Taylor Grimes is a 59 y.o.-year-old female, returning for f/u for hypothyroidism, dx 2012. Last visit 1 year and 3 months ago. Her sister, Taylor Grimes, is also my pt.  Pt is on levothyroxine 125 mcg daily, taken: - in am - fasting - at least 30 min from b'fast - + calcium in the evening - no iron - no multivitamins - no PPIs - not on Biotin  TFTs reviewed: Lab Results  Component Value Date   TSH 3.59 08/19/2019   TSH 1.07 03/10/2019   TSH 7.75 (H) 08/02/2018   TSH 2.52 03/08/2018   TSH 2.69 02/24/2017   TSH 1.37 06/18/2016   TSH 1.50 01/14/2016   TSH 1.68 09/28/2014   TSH 4.90 (H) 05/04/2014   TSH 4.60 (H) 03/09/2014   FREET4 1.45 03/10/2019   FREET4 1.01 03/08/2018   FREET4 1.15 02/24/2017   FREET4 1.44 01/14/2016   FREET4 1.60 09/28/2014   FREET4 1.14 05/04/2014   FREET4 1.05 03/09/2014   FREET4 1.14 09/29/2013   FREET4 1.03 08/18/2013   FREET4 0.87 11/12/2010   Pt denies: - feeling nodules in neck - hoarseness - dysphagia - choking - SOB with lying down  She still wakes up at 3 am to go to work and open [employer] at 4-7 am.  She now limited her work days to 32 h per week. Her fatigue improved.  No FH of thyroid cancer. However, all women in her family: hypothyroidism. No FH of thyroid cancer. No h/o radiation tx to head or neck.  No herbal supplements. No Biotin use. No recent steroids use.   ROS: Constitutional: no weight gain/no weight loss, + improved fatigue, no subjective hyperthermia, no subjective hypothermia Eyes: no blurry vision, no xerophthalmia ENT: no sore throat, +  see HPI Cardiovascular: no CP/no SOB/no palpitations/no leg swelling Respiratory: no cough/no SOB/no wheezing Gastrointestinal: no N/no V/no D/no C/no acid reflux Musculoskeletal: no muscle aches/no joint aches Skin: no rashes, no hair loss Neurological: no tremors/no numbness/no tingling/no dizziness  I reviewed pt's medications, allergies, PMH, social hx, family hx, and changes were documented in the history of present illness. Otherwise, unchanged from my initial visit note.  Past Medical History:  Diagnosis Date  . Anxiety   . Breast lesion    benign  . Diabetes mellitus without complication ([place])   . Elevated transaminase level    ? fatty liver   . 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  From [facility] Guidelines:  Recommended surveillance after treatment of endometrial cancer includes a follow-up visit every 3-6 months for 2 years, then every 6 months for 3 years, and annually thereafter. Each follow-up visit should include a thorough patient history; elicitation and investigation of any new symptoms associated with recurrence, such as vaginal bleeding, pelvic pain, weight loss, or lethargy; and a thorough speculum, pelvic, and rectovaginal examination  . HTN (hypertension)   . Hypothyroid   . UTI (urinary tract infection)    Past Surgical History:  Procedure Laterality Date  . ABDOMINAL HYSTERECTOMY    . BREAST BIOPSY  benign cyst 2011  . CESAREAN SECTION    . LAPAROSCOPIC TOTAL HYSTERECTOMY  11/2012   Social History   Socioeconomic History  . Marital status: Legally Separated    Spouse name: Not on file  . Number of children: 2  . Years of education: Not on file  . Highest education level: Not on file  Occupational History  . Occupation: shift Programme researcher, broadcasting/film/video: [employer]  . Smoking status: Former Smoker    Quit date: 07/22/1979    Years since  quitting: 40.9  . Smokeless tobacco: Never Used  . Tobacco comment: briefly smoked at 16  Substance and Sexual Activity  . Alcohol use: No    Alcohol/week: 0.0 standard drinks    Comment: rarely  . Drug use: No  . Sexual activity: Yes    Birth control/protection: Post-menopausal  Other Topics Concern  . Not on file  Social History Narrative  . Not on file   Social Determinants of Health   Financial Resource Strain:   . Difficulty of Paying Living Expenses: Not on file  Food Insecurity:   . Worried About Charity fundraiser in the Last Year: Not on file  . Ran Out of Food in the Last Year: Not on file  Transportation Needs:   . Lack of Transportation (Medical): Not on file  . Lack of Transportation (Non-Medical): Not on file  Physical Activity:   . Days of Exercise per Week: Not on file  . Minutes of Exercise per Session: Not on file  Stress:   . Feeling of Stress : Not on file  Social Connections:   . Frequency of Communication with Friends and Family: Not on file  . Frequency of Social Gatherings with Friends and Family: Not on file  . Attends Religious Services: Not on file  . Active Member of Clubs or Organizations: Not on file  . Attends Archivist Meetings: Not on file  . Marital Status: Not on file  Intimate Partner Violence:   . Fear of Current or Ex-Partner: Not on file  . Emotionally Abused: Not on file  . Physically Abused: Not on file  . Sexually Abused: Not on file   Current Outpatient Medications on File Prior to Visit  Medication Sig Dispense Refill  . atorvastatin (LIPITOR) 10 MG tablet Take 1 tablet (10 mg total) by mouth daily. In evening with a low fat snack 30 tablet 11  . busPIRone (BUSPAR) 15 MG tablet Take 0.5 tablets (7.5 mg total) by mouth 2 (two) times daily. 90 tablet 3  . glimepiride (AMARYL) 2 MG tablet Take 1 tablet (2 mg total) by mouth daily before breakfast. 30 tablet 11  . levothyroxine (SYNTHROID) 125 MCG tablet TAKE 1 TABLET  BY MOUTH EVERY DAY 90 tablet 3  . losartan (COZAAR) 100 MG tablet Take 0.5 tablets (50 mg total) by mouth daily. 45 tablet 3  . metFORMIN (GLUCOPHAGE) 1000 MG tablet Take 1 tablet (1,000 mg total) by mouth 2 (two) times daily with a meal. 60 tablet 11  . sulfamethoxazole-trimethoprim (BACTRIM DS) 800-160 MG tablet Take 1 tablet by mouth 2 (two) times daily. 6 tablet 0   No current facility-administered medications on file prior to visit.   No Known Allergies Family History  Problem Relation Age of Onset  . Hypothyroidism Mother   . Alzheimer's disease Mother   . Stroke Father   . Deep vein thrombosis Sister   . Colon cancer Neg Hx   .  Esophageal cancer Neg Hx   . Stomach cancer Neg Hx   . Rectal cancer Neg Hx     PE: BP 138/88   Pulse 67   Ht 5\' 3"  (1.6 m)   Wt 226 lb (102.5 kg)   LMP 07/21/2008   SpO2 97%   BMI 40.03 kg/m  Wt Readings from Last 3 Encounters:  06/21/20 226 lb (102.5 kg)  02/23/20 224 lb 4 oz (101.7 kg)  08/23/19 220 lb (99.8 kg)   Constitutional: overweight, in NAD Eyes: PERRLA, EOMI, no exophthalmos ENT: moist mucous membranes, no thyromegaly, no cervical lymphadenopathy Cardiovascular: RRR, No MRG Respiratory: CTA B Gastrointestinal: abdomen soft, NT, ND, BS+ Musculoskeletal: no deformities, strength intact in all 4 Skin: moist, warm, no rashes Neurological: no tremor with outstretched hands, DTR normal in all 4  ASSESSMENT: 1. Hypothyroidism  PLAN:  1. Patient with hypothyroidism, previously on replacement levothyroxine, now on appropriate doses.  She returns after an absence of a year and 3 months. - latest thyroid labs reviewed with pt >> normal: Lab Results  Component Value Date   TSH 3.59 08/19/2019   - she continues on LT4 125 mcg daily - pt feels good on this dose. - we discussed about taking the thyroid hormone every day, with water, >30 minutes before breakfast, separated by >4 hours from acid reflux medications, calcium, iron,  multivitamins. Pt. is taking it correctly. - will check thyroid tests today: TSH and fT4 - If labs are abnormal, she will need to return for repeat TFTs in 1.5 months - OTW, I will see her back in a year  + Flu shot today  Component     Latest Ref Rng & Units 06/21/2020  TSH     0.35 - 4.50 uIU/mL 0.52  T4,Free(Direct)     0.60 - 1.60 ng/dL 1.25  Normal TFTs.  Philemon Kingdom, MD PhD [facility]

## 2020-06-22 ENCOUNTER — Encounter: Payer: Self-pay | Admitting: Internal Medicine

## 2020-08-20 ENCOUNTER — Other Ambulatory Visit: Payer: Self-pay

## 2020-08-20 ENCOUNTER — Ambulatory Visit: Payer: Self-pay | Admitting: Podiatry

## 2020-08-24 ENCOUNTER — Other Ambulatory Visit: Payer: Self-pay | Admitting: Family Medicine

## 2020-08-24 NOTE — Telephone Encounter (Signed)
Last CPE was 08/23/19 no future appt please advise

## 2020-08-26 ENCOUNTER — Other Ambulatory Visit: Payer: Self-pay | Admitting: Family Medicine

## 2020-08-26 NOTE — Telephone Encounter (Signed)
I sent it  Please schedule PE

## 2020-08-27 NOTE — Telephone Encounter (Signed)
Patient scheduled for CPE and labs prior

## 2020-09-05 ENCOUNTER — Other Ambulatory Visit: Payer: Self-pay | Admitting: Family Medicine

## 2020-09-05 ENCOUNTER — Telehealth: Payer: Self-pay | Admitting: Family Medicine

## 2020-09-05 DIAGNOSIS — E039 Hypothyroidism, unspecified: Secondary | ICD-10-CM

## 2020-09-05 DIAGNOSIS — E119 Type 2 diabetes mellitus without complications: Secondary | ICD-10-CM

## 2020-09-05 DIAGNOSIS — E785 Hyperlipidemia, unspecified: Secondary | ICD-10-CM

## 2020-09-05 DIAGNOSIS — Z0000 Encounter for general adult medical examination without abnormal findings: Secondary | ICD-10-CM

## 2020-09-05 DIAGNOSIS — E1169 Type 2 diabetes mellitus with other specified complication: Secondary | ICD-10-CM

## 2020-09-05 DIAGNOSIS — E786 Lipoprotein deficiency: Secondary | ICD-10-CM

## 2020-09-05 DIAGNOSIS — I10 Essential (primary) hypertension: Secondary | ICD-10-CM

## 2020-09-05 NOTE — Telephone Encounter (Signed)
-----   Message from Cloyd Stagers, RT sent at 08/27/2020  9:44 AM EST ----- Regarding: Lab Orders for Friday 2.18.2022 Please place lab orders for  Friday 2.18.2022, office visit for physical on Wednesday 2.23.2022 Thank you, Dyke Maes RT(R)

## 2020-09-06 ENCOUNTER — Ambulatory Visit (INDEPENDENT_AMBULATORY_CARE_PROVIDER_SITE_OTHER): Payer: 59 | Admitting: Podiatry

## 2020-09-06 ENCOUNTER — Encounter: Payer: Self-pay | Admitting: Podiatry

## 2020-09-06 ENCOUNTER — Other Ambulatory Visit: Payer: Self-pay

## 2020-09-06 DIAGNOSIS — L84 Corns and callosities: Secondary | ICD-10-CM | POA: Insufficient documentation

## 2020-09-06 DIAGNOSIS — M79674 Pain in right toe(s): Secondary | ICD-10-CM | POA: Diagnosis not present

## 2020-09-06 DIAGNOSIS — B351 Tinea unguium: Secondary | ICD-10-CM | POA: Diagnosis not present

## 2020-09-06 DIAGNOSIS — M79675 Pain in left toe(s): Secondary | ICD-10-CM | POA: Diagnosis not present

## 2020-09-06 DIAGNOSIS — E119 Type 2 diabetes mellitus without complications: Secondary | ICD-10-CM

## 2020-09-06 NOTE — Progress Notes (Signed)
Complaint:  Visit Type: Patient returns to my office for continued preventative foot care services. Complaint: Patient states" my nails have grown long and thick and become painful to walk and wear shoes" Patient has been diagnosed with DM with no foot complications. The patient presents for preventative foot care services. No changes to ROS  Podiatric Exam: Vascular: dorsalis pedis and posterior tibial pulses are palpable bilateral. Capillary return is immediate. Temperature gradient is WNL. Skin turgor WNL  Sensorium: Normal Semmes Weinstein monofilament test. Normal tactile sensation bilaterally. Nail Exam: Pt has thick disfigured discolored nails with subungual debris noted bilateral entire nail hallux through fifth toenails Ulcer Exam: There is no evidence of ulcer or pre-ulcerative changes or infection. Orthopedic Exam: Muscle tone and strength are WNL. No limitations in general ROM. No crepitus or effusions noted. Foot type and digits show no abnormalities. Bony prominences are unremarkable. Skin:  Porokeratosis symptomatic.Marland Kitchen No infection or ulcers.  Porokeratosis sub 2,4 left foot.  Diagnosis:  Onychomycosis, , Pain in right toe, pain in left toes. porokeratosis.  Treatment & Plan Procedures and Treatment: Consent by patient was obtained for treatment procedures.   Debridement of mycotic and hypertrophic toenails, 1 through 5 bilateral and clearing of subungual debris. With nail nipper and dremel tool. No ulceration, no infection noted. Debride callus. With # 15 blade. Return Visit-Office Procedure: Patient instructed to return to the office for a follow up visit 3 months for continued evaluation and treatment.    Gardiner Barefoot DPM

## 2020-09-07 ENCOUNTER — Other Ambulatory Visit (INDEPENDENT_AMBULATORY_CARE_PROVIDER_SITE_OTHER): Payer: 59

## 2020-09-07 DIAGNOSIS — E039 Hypothyroidism, unspecified: Secondary | ICD-10-CM

## 2020-09-07 DIAGNOSIS — E119 Type 2 diabetes mellitus without complications: Secondary | ICD-10-CM | POA: Diagnosis not present

## 2020-09-07 DIAGNOSIS — Z0000 Encounter for general adult medical examination without abnormal findings: Secondary | ICD-10-CM

## 2020-09-07 DIAGNOSIS — E1169 Type 2 diabetes mellitus with other specified complication: Secondary | ICD-10-CM | POA: Diagnosis not present

## 2020-09-07 DIAGNOSIS — E785 Hyperlipidemia, unspecified: Secondary | ICD-10-CM

## 2020-09-07 LAB — CBC WITH DIFFERENTIAL/PLATELET
Basophils Absolute: 0 10*3/uL (ref 0.0–0.1)
Basophils Relative: 0.7 % (ref 0.0–3.0)
Eosinophils Absolute: 0.3 10*3/uL (ref 0.0–0.7)
Eosinophils Relative: 4.9 % (ref 0.0–5.0)
HCT: 39.1 % (ref 36.0–46.0)
Hemoglobin: 13.3 g/dL (ref 12.0–15.0)
Lymphocytes Relative: 32.6 % (ref 12.0–46.0)
Lymphs Abs: 2.2 10*3/uL (ref 0.7–4.0)
MCHC: 34 g/dL (ref 30.0–36.0)
MCV: 88.9 fl (ref 78.0–100.0)
Monocytes Absolute: 0.5 10*3/uL (ref 0.1–1.0)
Monocytes Relative: 8 % (ref 3.0–12.0)
Neutro Abs: 3.6 10*3/uL (ref 1.4–7.7)
Neutrophils Relative %: 53.8 % (ref 43.0–77.0)
Platelets: 184 10*3/uL (ref 150.0–400.0)
RBC: 4.39 Mil/uL (ref 3.87–5.11)
RDW: 13.3 % (ref 11.5–15.5)
WBC: 6.6 10*3/uL (ref 4.0–10.5)

## 2020-09-07 LAB — LIPID PANEL
Cholesterol: 168 mg/dL (ref 0–200)
HDL: 39.7 mg/dL (ref >39.00)
LDL Cholesterol: 99 mg/dL (ref 0–99)
NonHDL: 128.53
Total CHOL/HDL Ratio: 4
Triglycerides: 147 mg/dL (ref 0.0–149.0)
VLDL: 29.4 mg/dL (ref 0.0–40.0)

## 2020-09-07 LAB — COMPREHENSIVE METABOLIC PANEL
ALT: 32 U/L (ref 0–35)
AST: 31 U/L (ref 0–37)
Albumin: 3.7 g/dL (ref 3.5–5.2)
Alkaline Phosphatase: 102 U/L (ref 39–117)
BUN: 13 mg/dL (ref 6–23)
CO2: 29 mEq/L (ref 19–32)
Calcium: 9.6 mg/dL (ref 8.4–10.5)
Chloride: 104 mEq/L (ref 96–112)
Creatinine, Ser: 0.84 mg/dL (ref 0.40–1.20)
GFR: 74.9 mL/min (ref >60.00)
Glucose, Bld: 141 mg/dL — ABNORMAL HIGH (ref 70–99)
Potassium: 4.1 mEq/L (ref 3.5–5.1)
Sodium: 139 mEq/L (ref 135–145)
Total Bilirubin: 0.9 mg/dL (ref 0.2–1.2)
Total Protein: 6.7 g/dL (ref 6.0–8.3)

## 2020-09-07 LAB — TSH: TSH: 0.42 u[IU]/mL (ref 0.35–4.50)

## 2020-09-07 LAB — HEMOGLOBIN A1C: Hgb A1c MFr Bld: 7.2 % — ABNORMAL HIGH (ref 4.6–6.5)

## 2020-09-12 ENCOUNTER — Encounter: Payer: Self-pay | Admitting: Family Medicine

## 2020-09-12 ENCOUNTER — Other Ambulatory Visit: Payer: Self-pay

## 2020-09-12 ENCOUNTER — Ambulatory Visit (INDEPENDENT_AMBULATORY_CARE_PROVIDER_SITE_OTHER): Payer: 59 | Admitting: Family Medicine

## 2020-09-12 VITALS — BP 132/84 | HR 79 | Temp 96.9°F | Ht 61.5 in | Wt 220.4 lb

## 2020-09-12 DIAGNOSIS — E119 Type 2 diabetes mellitus without complications: Secondary | ICD-10-CM

## 2020-09-12 DIAGNOSIS — E785 Hyperlipidemia, unspecified: Secondary | ICD-10-CM

## 2020-09-12 DIAGNOSIS — Z0000 Encounter for general adult medical examination without abnormal findings: Secondary | ICD-10-CM

## 2020-09-12 DIAGNOSIS — I10 Essential (primary) hypertension: Secondary | ICD-10-CM | POA: Diagnosis not present

## 2020-09-12 DIAGNOSIS — E1169 Type 2 diabetes mellitus with other specified complication: Secondary | ICD-10-CM | POA: Diagnosis not present

## 2020-09-12 DIAGNOSIS — E039 Hypothyroidism, unspecified: Secondary | ICD-10-CM

## 2020-09-12 DIAGNOSIS — F411 Generalized anxiety disorder: Secondary | ICD-10-CM

## 2020-09-12 MED ORDER — METFORMIN HCL 1000 MG PO TABS
1000.0000 mg | ORAL_TABLET | Freq: Two times a day (BID) | ORAL | 3 refills | Status: DC
Start: 2020-09-12 — End: 2021-09-18

## 2020-09-12 MED ORDER — ATORVASTATIN CALCIUM 10 MG PO TABS
10.0000 mg | ORAL_TABLET | Freq: Every day | ORAL | 3 refills | Status: DC
Start: 2020-09-12 — End: 2021-09-18

## 2020-09-12 MED ORDER — LOSARTAN POTASSIUM 100 MG PO TABS
50.0000 mg | ORAL_TABLET | Freq: Every day | ORAL | 3 refills | Status: DC
Start: 2020-09-12 — End: 2021-10-04

## 2020-09-12 MED ORDER — GLIMEPIRIDE 2 MG PO TABS: 2.0000 mg | ORAL_TABLET | Freq: Every day | ORAL | 3 refills | Status: DC

## 2020-09-12 MED ORDER — BUSPIRONE HCL 15 MG PO TABS: 7.5000 mg | ORAL_TABLET | Freq: Two times a day (BID) | ORAL | 3 refills | Status: DC

## 2020-09-12 NOTE — Assessment & Plan Note (Signed)
bp in fair control at this time  BP Readings from Last 1 Encounters:  09/12/20 132/84   No changes needed Most recent labs reviewed  Disc lifstyle change with low sodium diet and exercise  Plan to continue losartan 100 mg daily

## 2020-09-12 NOTE — Assessment & Plan Note (Signed)
Hypothyroidism  Pt has no clinical changes No change in energy level/ hair or skin/ edema and no tremor Lab Results  Component Value Date   TSH 0.42 09/07/2020    Continues levothyroxine and f/u with endocrinology

## 2020-09-12 NOTE — Assessment & Plan Note (Signed)
Lab Results  Component Value Date   HGBA1C 7.2 (H) 09/07/2020   For the past mo diet and exercise have improved (since she retired)  Expect imp in 3 mo Will f/u  On statin and arb Good foot care-sees podiatry for nail care also

## 2020-09-12 NOTE — Patient Instructions (Addendum)
If you are interested in the shingles vaccine series (Shingrix), call your insurance or pharmacy to check on coverage and location it must be given.  If affordable - you can schedule it here or at your pharmacy depending on coverage   Follow up in 3 months - we will re check diabetes and cholesterol and I expect improvement   Take care of yourself  Keep walking! Try to get most of your carbohydrates from produce (with the exception of white potatoes)  Eat less bread/pasta/rice/snack foods/cereals/sweets and other items from the middle of the grocery store (processed carbs)

## 2020-09-12 NOTE — Assessment & Plan Note (Signed)
Improved after retiring  Plan to continue buspar 7.5 mg bid  Reviewed stressors/ coping techniques/symptoms/ support sources/ tx options and side effects in detail today

## 2020-09-12 NOTE — Assessment & Plan Note (Signed)
Disc goals for lipids and reasons to control them Rev last labs with pt Rev low sat fat diet in detail Taking atorvastatin 10 mg daily  Eating better now  Plan to re check in 3 mo

## 2020-09-12 NOTE — Assessment & Plan Note (Signed)
Discussed how this problem influences overall health and the risks it imposes  Reviewed plan for weight loss with lower calorie diet (via better food choices and also portion control or program like weight watchers) and exercise building up to or more than 30 minutes 5 days per week including some aerobic activity    

## 2020-09-12 NOTE — Progress Notes (Signed)
Subjective:    Patient ID: Taylor Grimes, female    DOB: 1960-09-22, 60 y.o.   MRN: [id number]  This visit occurred during the SARS-CoV-2 public health emergency.  Safety protocols were in place, including screening questions prior to the visit, additional usage of staff PPE, and extensive cleaning of exam room while observing appropriate contact time as indicated for disinfecting solutions.    HPI Here for health maintenance exam and to review chronic medical problems    Wt Readings from Last 3 Encounters:  09/12/20 220 lb 7 oz (100 kg)  06/21/20 226 lb (102.5 kg)  02/23/20 224 lb 4 oz (101.7 kg)  down 6 lb  Has been walking regularly -with her son  40.98 kg/m   She quit job -too stressful for her ([employer])  Retired at this time   covid status - has not had covid  Had J and J and then booster   Colonoscopy 6/15 with 5 y recall -then they inc to 2 y   Eye exam 3/21  Mammogram 11/21 Self breast exam -no lumps   Needs to f/u with gyn  H/o of endom ca in the past   Td 2/21 pna 2/21 Flu shot 12/21 Zoster status -interested in shingrix   HTN  bp is stable today  No cp or palpitations or headaches or edema  No side effects to medicines  BP Readings from Last 3 Encounters:  09/12/20 132/84  06/21/20 138/88  02/23/20 104/72     Pulse Readings from Last 3 Encounters:  09/12/20 79  06/21/20 67  02/23/20 99  losartan 100 mg daily  Tolerates well   DM2 Lab Results  Component Value Date   HGBA1C 7.2 (H) 09/07/2020   Up from 6.5 amaryl and metformin  Statin and arb Was not watching diet when she was working Is back on track now (for a month) -no sweets or sugar drinks  Time for self care now  Had foot visit recently  -taking care of feet      Hyperlipidemia Lab Results  Component Value Date   CHOL 168 09/07/2020   CHOL 177 08/19/2019   CHOL 148 02/11/2019   Lab Results  Component Value Date   HDL 39.70 09/07/2020   HDL 40.40 08/19/2019   HDL  44.30 02/11/2019   Lab Results  Component Value Date   LDLCALC 99 09/07/2020   LDLCALC 84 02/11/2019   LDLCALC 105 (H) 08/02/2018   Lab Results  Component Value Date   TRIG 147.0 09/07/2020   TRIG 202.0 (H) 08/19/2019   TRIG 102.0 02/11/2019   Lab Results  Component Value Date   CHOLHDL 4 09/07/2020   CHOLHDL 4 08/19/2019   CHOLHDL 3 02/11/2019   Lab Results  Component Value Date   LDLDIRECT 111.0 08/19/2019   LDLDIRECT 113.0 06/17/2017   Atorvastatin 10 mg daily Is eating better now this month  Avoids fried foods (prior she was)  Hypothyroidism  Pt has no clinical changes No change in energy level/ hair or skin/ edema and no tremor Lab Results  Component Value Date   TSH 0.42 09/07/2020    On levothyroxine 125 mcg   Lab Results  Component Value Date   CREATININE 0.84 09/07/2020   BUN 13 09/07/2020   NA 139 09/07/2020   K 4.1 09/07/2020   CL 104 09/07/2020   CO2 29 09/07/2020   Lab Results  Component Value Date   WBC 6.6 09/07/2020   HGB 13.3 09/07/2020  HCT 39.1 09/07/2020   MCV 88.9 09/07/2020   PLT 184.0 09/07/2020   Lab Results  Component Value Date   ALT 32 09/07/2020   AST 31 09/07/2020   ALKPHOS 102 09/07/2020   BILITOT 0.9 09/07/2020     Patient Active Problem List   Diagnosis Date Noted  . Callus 09/06/2020  . Hyperlipidemia associated with type 2 diabetes mellitus ([place]) 08/17/2018  . Hypertrophic toenail 02/02/2018  . Anxiety disorder 09/12/2015  . Low HDL (under 40) 10/24/2014  . Routine general medical examination at a health care facility 03/08/2014  . History of endometrial cancer 12/15/2012  . Colon cancer screening 11/01/2012  . DM type 2 (diabetes mellitus, type 2) ([place]) 06/30/2012  . Varicose veins of leg with swelling, left 11/18/2011  . Morbid obesity ([place]) 11/12/2010  . Essential hypertension 07/12/2009  . Hypothyroidism 05/16/2009   Past Medical History:  Diagnosis Date  . Anxiety   . Breast lesion    benign  .  Diabetes mellitus without complication ([place])   . Elevated transaminase level    ? fatty liver   . 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  From [facility]GO Guidelines:  Recommended surveillance after treatment of endometrial cancer includes a follow-up visit every 3-6 months for 2 years, then every 6 months for 3 years, and annually thereafter. Each follow-up visit should include a thorough patient history; elicitation and investigation of any new symptoms associated with recurrence, such as vaginal bleeding, pelvic pain, weight loss, or lethargy; and a thorough speculum, pelvic, and rectovaginal examination  . HTN (hypertension)   . Hypothyroid   . UTI (urinary tract infection)    Past Surgical History:  Procedure Laterality Date  . ABDOMINAL HYSTERECTOMY    . BREAST BIOPSY     benign cyst 2011  . CESAREAN SECTION    . LAPAROSCOPIC TOTAL HYSTERECTOMY  11/2012   Social History   Tobacco Use  . Smoking status: Former Smoker    Quit date: 07/22/1979    Years since quitting: 41.1  . Smokeless tobacco: Never Used  . Tobacco comment: briefly smoked at 16  Substance Use Topics  . Alcohol use: No    Alcohol/week: 0.0 standard drinks    Comment: rarely  . Drug use: No   Family History  Problem Relation Age of Onset  . Hypothyroidism Mother   . Alzheimer's disease Mother   . Stroke Father   . Deep vein thrombosis Sister   . Colon cancer Neg Hx   . Esophageal cancer Neg Hx   . Stomach cancer Neg Hx   . Rectal cancer Neg Hx    No Known Allergies Current Outpatient Medications on File Prior to Visit  Medication Sig Dispense Refill  . levothyroxine (SYNTHROID) 125 MCG tablet TAKE 1 TABLET BY MOUTH EVERY DAY 90 tablet 3   No current facility-administered medications on file prior to visit.     Review of Systems  Constitutional: Negative for activity change, appetite change, fatigue, fever and  unexpected weight change.  HENT: Negative for congestion, ear pain, rhinorrhea, sinus pressure and sore throat.   Eyes: Negative for pain, redness and visual disturbance.  Respiratory: Negative for cough, shortness of breath and wheezing.   Cardiovascular: Negative for chest pain and palpitations.  Gastrointestinal: Negative for abdominal pain, blood in stool, constipation and diarrhea.  Endocrine: Negative for polydipsia and polyuria.  Genitourinary: Negative for dysuria, frequency and urgency.  Musculoskeletal:  Positive for arthralgias. Negative for back pain and myalgias.  Skin: Negative for pallor and rash.  Allergic/Immunologic: Negative for environmental allergies.  Neurological: Negative for dizziness, syncope and headaches.  Hematological: Negative for adenopathy. Does not bruise/bleed easily.  Psychiatric/Behavioral: Negative for decreased concentration and dysphoric mood. The patient is not nervous/anxious.        Objective:   Physical Exam Constitutional:      General: She is not in acute distress.    Appearance: Normal appearance. She is well-developed. She is obese. She is not ill-appearing or diaphoretic.  HENT:     Head: Normocephalic and atraumatic.     Right Ear: Tympanic membrane, ear canal and external ear normal.     Left Ear: Tympanic membrane, ear canal and external ear normal.     Nose: Nose normal. No congestion.     Mouth/Throat:     Mouth: Mucous membranes are moist.     Pharynx: Oropharynx is clear. No posterior oropharyngeal erythema.  Eyes:     General: No scleral icterus.    Extraocular Movements: Extraocular movements intact.     Conjunctiva/sclera: Conjunctivae normal.     Pupils: Pupils are equal, round, and reactive to light.  Neck:     Thyroid: No thyromegaly.     Vascular: No carotid bruit or JVD.  Cardiovascular:     Rate and Rhythm: Normal rate and regular rhythm.     Pulses: Normal pulses.     Heart sounds: Normal heart sounds. No  gallop.   Pulmonary:     Effort: Pulmonary effort is normal. No respiratory distress.     Breath sounds: Normal breath sounds. No wheezing.     Comments: Good air exch Chest:     Chest wall: No tenderness.  Abdominal:     General: Bowel sounds are normal. There is no distension or abdominal bruit.     Palpations: Abdomen is soft. There is no mass.     Tenderness: There is no abdominal tenderness.     Hernia: No hernia is present.  Genitourinary:    Comments: Breast exam: No mass, nodules, thickening, tenderness, bulging, retraction, inflamation, nipple discharge or skin changes noted.  No axillary or clavicular LA.     Musculoskeletal:        General: No tenderness. Normal range of motion.     Cervical back: Normal range of motion and neck supple. No rigidity. No muscular tenderness.     Right lower leg: No edema.     Left lower leg: No edema.  Lymphadenopathy:     Cervical: No cervical adenopathy.  Skin:    General: Skin is warm and dry.     Coloration: Skin is not pale.     Findings: No erythema or rash.     Comments: Solar lentigines diffusely sks and skin tags noted  Neurological:     Mental Status: She is alert. Mental status is at baseline.     Cranial Nerves: No cranial nerve deficit.     Motor: No abnormal muscle tone.     Coordination: Coordination normal.     Gait: Gait normal.     Deep Tendon Reflexes: Reflexes are normal and symmetric. Reflexes normal.  Psychiatric:        Mood and Affect: Mood normal.        Cognition and Memory: Cognition and memory normal.           Assessment & Plan:   Problem List Items Addressed This Visit  Cardiovascular and Mediastinum   Essential hypertension    bp in fair control at this time  BP Readings from Last 1 Encounters:  09/12/20 132/84   No changes needed Most recent labs reviewed  Disc lifstyle change with low sodium diet and exercise  Plan to continue losartan 100 mg daily       Relevant Medications    atorvastatin (LIPITOR) 10 MG tablet   losartan (COZAAR) 100 MG tablet     Endocrine   Hypothyroidism    Hypothyroidism  Pt has no clinical changes No change in energy level/ hair or skin/ edema and no tremor Lab Results  Component Value Date   TSH 0.42 09/07/2020    Continues levothyroxine and f/u with endocrinology      DM type 2 (diabetes mellitus, type 2) ([place])    Lab Results  Component Value Date   HGBA1C 7.2 (H) 09/07/2020   For the past mo diet and exercise have improved (since she retired)  Expect imp in 3 mo Will f/u  On statin and arb Good foot care-sees podiatry for nail care also        Relevant Medications   atorvastatin (LIPITOR) 10 MG tablet   glimepiride (AMARYL) 2 MG tablet   losartan (COZAAR) 100 MG tablet   metFORMIN (GLUCOPHAGE) 1000 MG tablet   Hyperlipidemia associated with type 2 diabetes mellitus ([facility])    Disc goals for lipids and reasons to control them Rev last labs with pt Rev low sat fat diet in detail Taking atorvastatin 10 mg daily  Eating better now  Plan to re check in 3 mo      Relevant Medications   atorvastatin (LIPITOR) 10 MG tablet   glimepiride (AMARYL) 2 MG tablet   losartan (COZAAR) 100 MG tablet   metFORMIN (GLUCOPHAGE) 1000 MG tablet     Other   Morbid obesity ([place])    Discussed how this problem influences overall health and the risks it imposes  Reviewed plan for weight loss with lower calorie diet (via better food choices and also portion control or program like weight watchers) and exercise building up to or more than 30 minutes 5 days per week including some aerobic activity         Relevant Medications   glimepiride (AMARYL) 2 MG tablet   metFORMIN (GLUCOPHAGE) 1000 MG tablet   Routine general medical examination at a health care facility - Primary    Reviewed health habits including diet and exercise and skin cancer prevention Reviewed appropriate screening tests for age  Also reviewed health mt list, fam hx  and immunization status , as well as social and family history   See HPI utd breast and colon cancer screening and eye exam  Due for gyn f/u (past h/o endometrial ca) covid immunized-plans to call with the dates       Anxiety disorder    Improved after retiring  Plan to continue buspar 7.5 mg bid  Reviewed stressors/ coping techniques/symptoms/ support sources/ tx options and side effects in detail today       Relevant Medications   busPIRone (BUSPAR) 15 MG tablet

## 2020-09-12 NOTE — Assessment & Plan Note (Signed)
Reviewed health habits including diet and exercise and skin cancer prevention Reviewed appropriate screening tests for age  Also reviewed health mt list, fam hx and immunization status , as well as social and family history   See HPI utd breast and colon cancer screening and eye exam  Due for gyn f/u (past h/o endometrial ca) covid immunized-plans to call with the dates

## 2020-12-02 ENCOUNTER — Telehealth: Payer: Self-pay | Admitting: Family Medicine

## 2020-12-02 DIAGNOSIS — E1169 Type 2 diabetes mellitus with other specified complication: Secondary | ICD-10-CM

## 2020-12-02 DIAGNOSIS — E119 Type 2 diabetes mellitus without complications: Secondary | ICD-10-CM

## 2020-12-02 DIAGNOSIS — E785 Hyperlipidemia, unspecified: Secondary | ICD-10-CM

## 2020-12-02 NOTE — Telephone Encounter (Signed)
-----   Message from Ellamae Sia sent at 11/19/2020 11:25 AM EDT ----- Regarding: Lab orders for Monday, 5.16.22 Lab orders for a 3 month follow up appt.

## 2020-12-03 ENCOUNTER — Other Ambulatory Visit (INDEPENDENT_AMBULATORY_CARE_PROVIDER_SITE_OTHER): Payer: 59

## 2020-12-03 ENCOUNTER — Other Ambulatory Visit: Payer: Self-pay

## 2020-12-03 DIAGNOSIS — E1169 Type 2 diabetes mellitus with other specified complication: Secondary | ICD-10-CM | POA: Diagnosis not present

## 2020-12-03 DIAGNOSIS — E785 Hyperlipidemia, unspecified: Secondary | ICD-10-CM

## 2020-12-03 DIAGNOSIS — E119 Type 2 diabetes mellitus without complications: Secondary | ICD-10-CM | POA: Diagnosis not present

## 2020-12-03 LAB — HEMOGLOBIN A1C: Hgb A1c MFr Bld: 8.1 % — ABNORMAL HIGH (ref 4.6–6.5)

## 2020-12-03 LAB — LIPID PANEL
Cholesterol: 168 mg/dL (ref 0–200)
HDL: 36.9 mg/dL — ABNORMAL LOW (ref >39.00)
LDL Cholesterol: 99 mg/dL (ref 0–99)
NonHDL: 130.86
Total CHOL/HDL Ratio: 5
Triglycerides: 160 mg/dL — ABNORMAL HIGH (ref 0.0–149.0)
VLDL: 32 mg/dL (ref 0.0–40.0)

## 2020-12-06 ENCOUNTER — Encounter: Payer: Self-pay | Admitting: Podiatry

## 2020-12-06 ENCOUNTER — Other Ambulatory Visit: Payer: Self-pay

## 2020-12-06 ENCOUNTER — Ambulatory Visit: Payer: 59 | Admitting: Podiatry

## 2020-12-06 DIAGNOSIS — M79675 Pain in left toe(s): Secondary | ICD-10-CM | POA: Diagnosis not present

## 2020-12-06 DIAGNOSIS — E119 Type 2 diabetes mellitus without complications: Secondary | ICD-10-CM

## 2020-12-06 DIAGNOSIS — B351 Tinea unguium: Secondary | ICD-10-CM | POA: Diagnosis not present

## 2020-12-06 DIAGNOSIS — M79674 Pain in right toe(s): Secondary | ICD-10-CM | POA: Diagnosis not present

## 2020-12-06 NOTE — Progress Notes (Signed)
Complaint:  Visit Type: Patient returns to my office for continued preventative foot care services. Complaint: Patient states" my nails have grown long and thick and become painful to walk and wear shoes" Patient has been diagnosed with DM with no foot complications. The patient presents for preventative foot care services. No changes to ROS  Podiatric Exam: Vascular: dorsalis pedis and posterior tibial pulses are palpable bilateral. Capillary return is immediate. Temperature gradient is WNL. Skin turgor WNL  Sensorium: Normal Semmes Weinstein monofilament test. Normal tactile sensation bilaterally. Nail Exam: Pt has thick disfigured discolored nails with subungual debris noted bilateral entire nail hallux through fifth toenails Ulcer Exam: There is no evidence of ulcer or pre-ulcerative changes or infection. Orthopedic Exam: Muscle tone and strength are WNL. No limitations in general ROM. No crepitus or effusions noted. Foot type and digits show no abnormalities. Bony prominences are unremarkable. Skin:  Porokeratosis symptomatic.Marland Kitchen No infection or ulcers.  Porokeratosis sub 2,4 left foot.  Diagnosis:  Onychomycosis, , Pain in right toe, pain in left toes.   Treatment & Plan Procedures and Treatment: Consent by patient was obtained for treatment procedures.   Debridement of mycotic and hypertrophic toenails, 1 through 5 bilateral and clearing of subungual debris. With nail nipper and dremel tool. No ulceration, no infection noted.  Return Visit-Office Procedure: Patient instructed to return to the office for a follow up visit 3 months for continued evaluation and treatment.    Gardiner Barefoot DPM

## 2020-12-10 ENCOUNTER — Ambulatory Visit (INDEPENDENT_AMBULATORY_CARE_PROVIDER_SITE_OTHER): Payer: 59 | Admitting: Family Medicine

## 2020-12-10 ENCOUNTER — Other Ambulatory Visit: Payer: Self-pay

## 2020-12-10 ENCOUNTER — Encounter: Payer: Self-pay | Admitting: Family Medicine

## 2020-12-10 VITALS — BP 128/80 | HR 78 | Temp 97.9°F | Ht 61.5 in | Wt 221.3 lb

## 2020-12-10 DIAGNOSIS — E1169 Type 2 diabetes mellitus with other specified complication: Secondary | ICD-10-CM

## 2020-12-10 DIAGNOSIS — I10 Essential (primary) hypertension: Secondary | ICD-10-CM

## 2020-12-10 DIAGNOSIS — E785 Hyperlipidemia, unspecified: Secondary | ICD-10-CM

## 2020-12-10 DIAGNOSIS — Z23 Encounter for immunization: Secondary | ICD-10-CM

## 2020-12-10 DIAGNOSIS — E119 Type 2 diabetes mellitus without complications: Secondary | ICD-10-CM

## 2020-12-10 DIAGNOSIS — E786 Lipoprotein deficiency: Secondary | ICD-10-CM | POA: Diagnosis not present

## 2020-12-10 MED ORDER — GLIMEPIRIDE 4 MG PO TABS: 4.0000 mg | ORAL_TABLET | Freq: Every day | ORAL | 1 refills | Status: DC

## 2020-12-10 NOTE — Assessment & Plan Note (Signed)
A1c is up significantly to 8.1 with sub optimal diet , now thinks she can improve  Also needs exercise- disc options for this to work up to at least 30 minutes per day  Will inc amaryl to 4 mg daily Continue metformin 1000 mg bid  Eye exam is overdue, pt plans to schedule and declines referral  Taking arb and statin  Regular foot exams from podiatry  Plan to check glucose bid and f/u in 4-6 wk to review  inst to update if any hypoglycemia

## 2020-12-10 NOTE — Patient Instructions (Addendum)
Make a plan to get at least 30 minutes of exercise daily (indoors and/or out)   Try to get most of your carbohydrates from produce (with the exception of white potatoes)  Eat less bread/pasta/rice/snack foods/cereals/sweets and other items from the middle of the grocery store (processed carbs)  Let's go up on amaryl dose to 4 mg daily   Check glucose am fasting, then 2 hours after a meal  We want to establish a trend  Keep a record  Follow up in 4-6 weeks  If any glucose under 70 - let us know

## 2020-12-10 NOTE — Assessment & Plan Note (Signed)
bp in fair control at this time  BP Readings from Last 1 Encounters:  12/10/20 128/80   No changes needed Most recent labs reviewed  Disc lifstyle change with low sodium diet and exercise  Plan to continue losartan 50 mg daily

## 2020-12-10 NOTE — Progress Notes (Signed)
Subjective:    Patient ID: Taylor Grimes, female    DOB: [birth year], 60 y.o.   MRN: [id number]  This visit occurred during the SARS-CoV-2 public health emergency.  Safety protocols were in place, including screening questions prior to the visit, additional usage of staff PPE, and extensive cleaning of exam room while observing appropriate contact time as indicated for disinfecting solutions.    HPI Pt presents for f/u of DM2 and other chronic medical problems   Wt Readings from Last 3 Encounters:  12/10/20 221 lb 5 oz (100.4 kg)  09/12/20 220 lb 7 oz (100 kg)  06/21/20 226 lb (102.5 kg)   41.14 kg/m  Feeling good   Eating less greasy food  Walks some - has a puppy that will be able to walk with her now   HTN bp is stable today  No cp or palpitations or headaches or edema  No side effects to medicines  BP Readings from Last 3 Encounters:  12/10/20 128/80  09/12/20 132/84  06/21/20 138/88     Pulse Readings from Last 3 Encounters:  12/10/20 78  09/12/20 79  06/21/20 67   Losartan 50 mg daily   DM2 Lab Results  Component Value Date   HGBA1C 8.1 (H) 12/03/2020   This is up from 7.2 Poor diet last time   Was not watching diet well , too many white potatoes  Now trying to avoid processed carbs   Glucose runs about 100 in am fasting   Metformin 1000 mg bid amaryl 2 mg daily   Eye care -exam is due and pt plans to schedule  Sees podiatry for foot care (vist 5/19)   On statin and arb  Hyperlipidemia Lab Results  Component Value Date   CHOL 168 12/03/2020   CHOL 168 09/07/2020   CHOL 177 08/19/2019   Lab Results  Component Value Date   HDL 36.90 (L) 12/03/2020   HDL 39.70 09/07/2020   HDL 40.40 08/19/2019   Lab Results  Component Value Date   LDLCALC 99 12/03/2020   LDLCALC 99 09/07/2020   LDLCALC 84 02/11/2019   Lab Results  Component Value Date   TRIG 160.0 (H) 12/03/2020   TRIG 147.0 09/07/2020   TRIG 202.0 (H) 08/19/2019   Lab Results   Component Value Date   CHOLHDL 5 12/03/2020   CHOLHDL 4 09/07/2020   CHOLHDL 4 08/19/2019   Lab Results  Component Value Date   LDLDIRECT 111.0 08/19/2019   LDLDIRECT 113.0 06/17/2017  no more greasy foods  Atorvastatin 10 mg daily   Wants to get her shingrix vaccine today  Patient Active Problem List   Diagnosis Date Noted  . Callus 09/06/2020  . Hyperlipidemia associated with type 2 diabetes mellitus ([place]) 08/17/2018  . Hypertrophic toenail 02/02/2018  . Anxiety disorder 09/12/2015  . Low HDL (under 40) 10/24/2014  . Routine general medical examination at a health care facility 03/08/2014  . History of endometrial cancer 12/15/2012  . Colon cancer screening 11/01/2012  . DM type 2 (diabetes mellitus, type 2) ([place]) 06/30/2012  . Varicose veins of leg with swelling, left 11/18/2011  . Morbid obesity ([place]) 11/12/2010  . Essential hypertension 07/12/2009  . Hypothyroidism 05/16/2009   Past Medical History:  Diagnosis Date  . Anxiety   . Breast lesion    benign  . Diabetes mellitus without complication ([place])   . Elevated transaminase level    ? fatty liver   . History of endometrial cancer 12/15/2012  FIGO Grade 2 endometrioid adenocarcinoma with squamous differentiation involving the mucosa only.  From SGO Guidelines:  Recommended surveillance after treatment of endometrial cancer includes a follow-up visit every 3-6 months for 2 years, then every 6 months for 3 years, and annually thereafter. Each follow-up visit should include a thorough patient history; elicitation and investigation of any new symptoms associated with recurrence, such as vaginal bleeding, pelvic pain, weight loss, or lethargy; and a thorough speculum, pelvic, and rectovaginal examination  . HTN (hypertension)   . Hypothyroid   . UTI (urinary tract infection)    Past Surgical History:  Procedure Laterality Date  . ABDOMINAL HYSTERECTOMY    . BREAST BIOPSY     benign cyst 2011  . CESAREAN SECTION     . LAPAROSCOPIC TOTAL HYSTERECTOMY  11/2012   Social History   Tobacco Use  . Smoking status: Former Smoker    Quit date: 07/22/1979    Years since quitting: 41.4  . Smokeless tobacco: Never Used  . Tobacco comment: briefly smoked at 16  Substance Use Topics  . Alcohol use: No    Alcohol/week: 0.0 standard drinks    Comment: rarely  . Drug use: No   Family History  Problem Relation Age of Onset  . Hypothyroidism Mother   . Alzheimer's disease Mother   . Stroke Father   . Deep vein thrombosis Sister   . Colon cancer Neg Hx   . Esophageal cancer Neg Hx   . Stomach cancer Neg Hx   . Rectal cancer Neg Hx    No Known Allergies Current Outpatient Medications on File Prior to Visit  Medication Sig Dispense Refill  . atorvastatin (LIPITOR) 10 MG tablet Take 1 tablet (10 mg total) by mouth daily. In evening with a low fat snack 90 tablet 3  . busPIRone (BUSPAR) 15 MG tablet Take 0.5 tablets (7.5 mg total) by mouth 2 (two) times daily. 90 tablet 3  . levothyroxine (SYNTHROID) 125 MCG tablet TAKE 1 TABLET BY MOUTH EVERY DAY 90 tablet 3  . losartan (COZAAR) 100 MG tablet Take 0.5 tablets (50 mg total) by mouth daily. 45 tablet 3  . metFORMIN (GLUCOPHAGE) 1000 MG tablet Take 1 tablet (1,000 mg total) by mouth 2 (two) times daily with a meal. 180 tablet 3   No current facility-administered medications on file prior to visit.    Review of Systems  Constitutional: Negative for activity change, appetite change, fatigue, fever and unexpected weight change.  HENT: Negative for congestion, ear pain, rhinorrhea, sinus pressure and sore throat.   Eyes: Negative for pain, redness and visual disturbance.  Respiratory: Negative for cough, shortness of breath and wheezing.   Cardiovascular: Negative for chest pain and palpitations.  Gastrointestinal: Negative for abdominal pain, blood in stool, constipation and diarrhea.  Endocrine: Negative for polydipsia and polyuria.  Genitourinary: Negative for  dysuria, frequency and urgency.  Musculoskeletal: Negative for arthralgias, back pain and myalgias.  Skin: Negative for pallor and rash.  Allergic/Immunologic: Negative for environmental allergies.  Neurological: Negative for dizziness, syncope and headaches.  Hematological: Negative for adenopathy. Does not bruise/bleed easily.  Psychiatric/Behavioral: Negative for decreased concentration and dysphoric mood. The patient is not nervous/anxious.        Objective:   Physical Exam Constitutional:      General: She is not in acute distress.    Appearance: Normal appearance. She is well-developed. She is obese. She is not ill-appearing.  HENT:     Head: Normocephalic and atraumatic.  Eyes:  Conjunctiva/sclera: Conjunctivae normal.     Pupils: Pupils are equal, round, and reactive to light.  Neck:     Thyroid: No thyromegaly.     Vascular: No carotid bruit or JVD.  Cardiovascular:     Rate and Rhythm: Normal rate and regular rhythm.     Heart sounds: Normal heart sounds. No gallop.      Comments: Varicosities in LEs Pulmonary:     Effort: Pulmonary effort is normal. No respiratory distress.     Breath sounds: Normal breath sounds. No wheezing or rales.  Abdominal:     General: Bowel sounds are normal. There is no distension or abdominal bruit.     Palpations: Abdomen is soft. There is no mass.     Tenderness: There is no abdominal tenderness.  Musculoskeletal:     Cervical back: Normal range of motion and neck supple.     Right lower leg: No edema.     Left lower leg: No edema.  Lymphadenopathy:     Cervical: No cervical adenopathy.  Skin:    General: Skin is warm and dry.     Coloration: Skin is not pale.     Findings: No erythema or rash.  Neurological:     Mental Status: She is alert.     Sensory: No sensory deficit.     Deep Tendon Reflexes: Reflexes are normal and symmetric. Reflexes normal.  Psychiatric:        Mood and Affect: Mood normal.            Assessment & Plan:   Problem List Items Addressed This Visit      Cardiovascular and Mediastinum   Essential hypertension    bp in fair control at this time  BP Readings from Last 1 Encounters:  12/10/20 128/80   No changes needed Most recent labs reviewed  Disc lifstyle change with low sodium diet and exercise  Plan to continue losartan 50 mg daily         Endocrine   DM type 2 (diabetes mellitus, type 2) (HCC) - Primary    A1c is up significantly to 8.1 with sub optimal diet , now thinks she can improve  Also needs exercise- disc options for this to work up to at least 30 minutes per day  Will inc amaryl to 4 mg daily Continue metformin 1000 mg bid  Eye exam is overdue, pt plans to schedule and declines referral  Taking arb and statin  Regular foot exams from podiatry  Plan to check glucose bid and f/u in 4-6 wk to review  inst to update if any hypoglycemia       Relevant Medications   glimepiride (AMARYL) 4 MG tablet   Hyperlipidemia associated with type 2 diabetes mellitus ([place])    Disc goals for lipids and reasons to control them Rev last labs with pt Rev low sat fat diet in detail Taking atorvastatin 10 mg  HDL is still low- discussed importance of exercise for this       Relevant Medications   glimepiride (AMARYL) 4 MG tablet     Other   Morbid obesity ([place])    Discussed how this problem influences overall health and the risks it imposes  Reviewed plan for weight loss with lower calorie diet (via better food choices and also portion control or program like weight watchers) and exercise building up to or more than 30 minutes 5 days per week including some aerobic activity   Offered ref to the  healthy weight clinic but pt does not drive in [place]        Relevant Medications   glimepiride (AMARYL) 4 MG tablet   Low HDL (under 40)    HDL of 36.9  Suspect hereditary Taking atorvastatin 10 mg daily  Discussed need for more exercise and plan to work on that         Other Visit Diagnoses    Need for shingles vaccine       Relevant Orders   Varicella-zoster vaccine IM (Shingrix) (Completed)

## 2020-12-10 NOTE — Assessment & Plan Note (Signed)
HDL of 36.9  Suspect hereditary Taking atorvastatin 10 mg daily  Discussed need for more exercise and plan to work on that

## 2020-12-10 NOTE — Assessment & Plan Note (Signed)
Disc goals for lipids and reasons to control them Rev last labs with pt Rev low sat fat diet in detail Taking atorvastatin 10 mg  HDL is still low- discussed importance of exercise for this

## 2020-12-10 NOTE — Assessment & Plan Note (Signed)
Discussed how this problem influences overall health and the risks it imposes  Reviewed plan for weight loss with lower calorie diet (via better food choices and also portion control or program like weight watchers) and exercise building up to or more than 30 minutes 5 days per week including some aerobic activity   Offered ref to the healthy weight clinic but pt does not drive in [facility]

## 2021-01-02 LAB — HM DIABETES EYE EXAM

## 2021-01-16 ENCOUNTER — Encounter: Payer: Self-pay | Admitting: Gastroenterology

## 2021-01-17 ENCOUNTER — Other Ambulatory Visit: Payer: Self-pay

## 2021-01-17 ENCOUNTER — Encounter: Payer: Self-pay | Admitting: Family Medicine

## 2021-01-17 ENCOUNTER — Ambulatory Visit (INDEPENDENT_AMBULATORY_CARE_PROVIDER_SITE_OTHER): Payer: 59 | Admitting: Family Medicine

## 2021-01-17 VITALS — BP 120/80 | HR 71 | Temp 97.6°F | Ht 62.0 in | Wt 221.0 lb

## 2021-01-17 DIAGNOSIS — E119 Type 2 diabetes mellitus without complications: Secondary | ICD-10-CM

## 2021-01-17 NOTE — Progress Notes (Signed)
Subjective:    Patient ID: Taylor Grimes, female    DOB: 02/14/1961, 60 y.o.   MRN: [id number]  This visit occurred during the SARS-CoV-2 public health emergency.  Safety protocols were in place, including screening questions prior to the visit, additional usage of staff PPE, and extensive cleaning of exam room while observing appropriate contact time as indicated for disinfecting solutions.   HPI Pt presents for f/u of DM2  Wt Readings from Last 3 Encounters:  01/17/21 221 lb (100.2 kg)  12/10/20 221 lb 5 oz (100.4 kg)  09/12/20 220 lb 7 oz (100 kg)   40.42 kg/m Lost 3 lb at home /her scale   Really watching her diet  Baking food  (stopped that)  More water   Cutting back on starches 1 pc bread  Pasta once per weeks   Has swapped other things for fruit  No sweets/out of the house   Walking every other day 30 minutes   Lab Results  Component Value Date   HGBA1C 8.1 (H) 12/03/2020   Metformin 1000 mg bid Amaryl 2 mg daily - inc to 4 mg daily at last visit  Diet was not good at the time   No low readings  Am -90s to low 100s Pm 120s to 130s usually   Feels a whole lot better now   Recent eye exam good/no retinopathy- Dr Heide Spark in [place]) [place] cataract formation    Patient Active Problem List   Diagnosis Date Noted   Callus 09/06/2020   Hyperlipidemia associated with type 2 diabetes mellitus ([place]) 08/17/2018   Hypertrophic toenail 02/02/2018   Anxiety disorder 09/12/2015   Low HDL (under 40) 10/24/2014   Routine general medical examination at a health care facility 03/08/2014   History of endometrial cancer 12/15/2012   Colon cancer screening 11/01/2012   DM type 2 (diabetes mellitus, type 2) ([place]) 06/30/2012   Varicose veins of leg with swelling, left 11/18/2011   Morbid obesity ([place]) 11/12/2010   Essential hypertension 07/12/2009   Hypothyroidism 05/16/2009   Past Medical History:  Diagnosis Date   Anxiety    Breast lesion    benign    Diabetes mellitus without complication ([facility])    Elevated transaminase level    ? fatty liver    History of endometrial cancer 12/15/2012   FIGO Grade 2 endometrioid adenocarcinoma with squamous differentiation involving the mucosa only.  From SGO Guidelines:  Recommended surveillance after treatment of endometrial cancer includes a follow-up visit every 3-6 months for 2 years, then every 6 months for 3 years, and annually thereafter. Each follow-up visit should include a thorough patient history; elicitation and investigation of any new symptoms associated with recurrence, such as vaginal bleeding, pelvic pain, weight loss, or lethargy; and a thorough speculum, pelvic, and rectovaginal examination   HTN (hypertension)    Hypothyroid    UTI (urinary tract infection)    Past Surgical History:  Procedure Laterality Date   ABDOMINAL HYSTERECTOMY     BREAST BIOPSY     benign cyst 2011   [place] TOTAL HYSTERECTOMY  11/2012   Social History   Tobacco Use   Smoking status: Former    Pack years: 0.00    Types: Cigarettes    Quit date: 07/22/1979    Years since quitting: 41.5   Smokeless tobacco: Never   Tobacco comments:    briefly smoked at 16  Substance Use Topics   Alcohol use: No  Alcohol/week: 0.0 standard drinks    Comment: rarely   Drug use: No   Family History  Problem Relation Age of Onset   Hypothyroidism Mother    Alzheimer's disease Mother    Stroke Father    Deep vein thrombosis Sister    Colon cancer Neg Hx    Esophageal cancer Neg Hx    Stomach cancer Neg Hx    Rectal cancer Neg Hx    No Known Allergies Current Outpatient Medications on File Prior to Visit  Medication Sig Dispense Refill   atorvastatin (LIPITOR) 10 MG tablet Take 1 tablet (10 mg total) by mouth daily. In evening with a low fat snack 90 tablet 3   busPIRone (BUSPAR) 15 MG tablet Take 0.5 tablets (7.5 mg total) by mouth 2 (two) times daily. 90 tablet 3   glimepiride  (AMARYL) 4 MG tablet Take 1 tablet (4 mg total) by mouth daily before breakfast. 90 tablet 1   levothyroxine (SYNTHROID) 125 MCG tablet TAKE 1 TABLET BY MOUTH EVERY DAY 90 tablet 3   losartan (COZAAR) 100 MG tablet Take 0.5 tablets (50 mg total) by mouth daily. 45 tablet 3   metFORMIN (GLUCOPHAGE) 1000 MG tablet Take 1 tablet (1,000 mg total) by mouth 2 (two) times daily with a meal. 180 tablet 3   No current facility-administered medications on file prior to visit.     Review of Systems  Constitutional:  Negative for activity change, appetite change, fatigue, fever and unexpected weight change.  HENT:  Negative for congestion, ear pain, rhinorrhea, sinus pressure and sore throat.   Eyes:  Negative for pain, redness and visual disturbance.  Respiratory:  Negative for cough, shortness of breath and wheezing.   Cardiovascular:  Negative for chest pain and palpitations.  Gastrointestinal:  Negative for abdominal pain, blood in stool, constipation and diarrhea.  Endocrine: Negative for polydipsia and polyuria.  Genitourinary:  Negative for dysuria, frequency and urgency.  Musculoskeletal:  Negative for arthralgias, back pain and myalgias.  Skin:  Negative for pallor and rash.  Allergic/Immunologic: Negative for environmental allergies.  Neurological:  Negative for dizziness, syncope and headaches.  Hematological:  Negative for adenopathy. Does not bruise/bleed easily.  Psychiatric/Behavioral:  Negative for decreased concentration and dysphoric mood. The patient is not nervous/anxious.       Objective:   Physical Exam Constitutional:      General: She is not in acute distress.    Appearance: Normal appearance. She is well-developed. She is obese. She is not ill-appearing or diaphoretic.  HENT:     Head: Normocephalic and atraumatic.  Eyes:     Conjunctiva/sclera: Conjunctivae normal.     Pupils: Pupils are equal, round, and reactive to light.  Neck:     Thyroid: No thyromegaly.      Vascular: No carotid bruit or JVD.  Cardiovascular:     Rate and Rhythm: Normal rate and regular rhythm.     Heart sounds: Normal heart sounds.    No gallop.  Pulmonary:     Effort: Pulmonary effort is normal. No respiratory distress.     Breath sounds: Normal breath sounds. No wheezing or rales.  Abdominal:     General: Bowel sounds are normal. There is no distension or abdominal bruit.     Palpations: Abdomen is soft. There is no mass.     Tenderness: There is no abdominal tenderness.  Musculoskeletal:     Cervical back: Normal range of motion and neck supple.     Right lower  leg: No edema.     Left lower leg: No edema.  Lymphadenopathy:     Cervical: No cervical adenopathy.  Skin:    General: Skin is warm and dry.     Coloration: Skin is not pale.     Findings: No rash.  Neurological:     Mental Status: She is alert.     Coordination: Coordination normal.     Deep Tendon Reflexes: Reflexes are normal and symmetric. Reflexes normal.  Psychiatric:        Mood and Affect: Mood normal.        Cognition and Memory: Cognition and memory normal.          Assessment & Plan:   Problem List Items Addressed This Visit       Endocrine   DM type 2 (diabetes mellitus, type 2) ([place]) - Primary    a1c was up to 8.1 last time  We inc amaryl to 4 mg daily  Continues metformin 1000 mg bid  Eating much better! -commended  Glucose readings are improved significantly  F/u in mid aug for visit with a1c Enc her to keep up the good work  Update if any hypoglycemia

## 2021-01-17 NOTE — Assessment & Plan Note (Signed)
a1c was up to 8.1 last time  We inc amaryl to 4 mg daily  Continues metformin 1000 mg bid  Eating much better! -commended  Glucose readings are improved significantly  F/u in mid aug for visit with a1c Enc her to keep up the good work  Update if any hypoglycemia

## 2021-01-17 NOTE — Patient Instructions (Addendum)
Try to get most of your carbohydrates from produce (with the exception of white potatoes)  Eat less bread/pasta/rice/snack foods/cereals/sweets and other items from the middle of the grocery store (processed carbs)  Keep up the good work  Stay on current medicines  Stop at check out to schedule follow up and sign release to get your eye exam report

## 2021-02-13 ENCOUNTER — Encounter: Payer: Self-pay | Admitting: Family Medicine

## 2021-03-05 ENCOUNTER — Other Ambulatory Visit: Payer: Self-pay

## 2021-03-05 ENCOUNTER — Ambulatory Visit (INDEPENDENT_AMBULATORY_CARE_PROVIDER_SITE_OTHER): Payer: 59 | Admitting: Family Medicine

## 2021-03-05 ENCOUNTER — Encounter: Payer: Self-pay | Admitting: Family Medicine

## 2021-03-05 VITALS — BP 134/84 | HR 78 | Temp 98.3°F | Ht 62.0 in | Wt 220.4 lb

## 2021-03-05 DIAGNOSIS — E785 Hyperlipidemia, unspecified: Secondary | ICD-10-CM

## 2021-03-05 DIAGNOSIS — I10 Essential (primary) hypertension: Secondary | ICD-10-CM | POA: Diagnosis not present

## 2021-03-05 DIAGNOSIS — E1169 Type 2 diabetes mellitus with other specified complication: Secondary | ICD-10-CM | POA: Diagnosis not present

## 2021-03-05 DIAGNOSIS — E119 Type 2 diabetes mellitus without complications: Secondary | ICD-10-CM | POA: Diagnosis not present

## 2021-03-05 LAB — POCT GLYCOSYLATED HEMOGLOBIN (HGB A1C): Hemoglobin A1C: 7.4 % — AB (ref 4.0–5.6)

## 2021-03-05 NOTE — Progress Notes (Signed)
Subjective:    Patient ID: Taylor Grimes, female    DOB: September 29, 1960, 60 y.o.   MRN: [id number]  This visit occurred during the SARS-CoV-2 public health emergency.  Safety protocols were in place, including screening questions prior to the visit, additional usage of staff PPE, and extensive cleaning of exam room while observing appropriate contact time as indicated for disinfecting solutions.   HPI Pt presents for f/u of DM2  Wt Readings from Last 3 Encounters:  03/05/21 220 lb 7 oz (100 kg)  01/17/21 221 lb (100.2 kg)  12/10/20 221 lb 5 oz (100.4 kg)   40.32 kg/m Last visit was eating much better   HTN bp is stable today  No cp or palpitations or headaches or edema  No side effects to medicines  BP Readings from Last 3 Encounters:  03/05/21 134/84  01/17/21 120/80  12/10/20 128/80     Losartan 50 mg daily  Lab Results  Component Value Date   CREATININE 0.84 09/07/2020   BUN 13 09/07/2020   NA 139 09/07/2020   K 4.1 09/07/2020   CL 104 09/07/2020   CO2 29 09/07/2020     Last visit:  DM type 2 (diabetes mellitus, type 2) ([place]) - Primary     a1c was up to 8.1 last time  We inc amaryl to 4 mg daily  Continues metformin 1000 mg bid  Eating much better! -commended  Glucose readings are improved significantly  F/u in mid aug for visit with a1c Enc her to keep up the good work  Update if any hypoglycemia    Lab Results  Component Value Date   HGBA1C 8.1 (H) 12/03/2020    Still working on it  Ate crackers this am with pb  Not working-she has more time to take care of herself  Continues metformin 1000 mg bid with the amaryl Avoids sweets    Today improved Lab Results  Component Value Date   HGBA1C 7.4 (A) 03/05/2021   No low readings  Checks glucose about once per day   Fasting - 130s (but up and down) -occ 90s  Does not check later in the day      Hyperlipidemia Lab Results  Component Value Date   CHOL 168 12/03/2020   HDL 36.90 (L) 12/03/2020    LDLCALC 99 12/03/2020   LDLDIRECT 111.0 08/19/2019   TRIG 160.0 (H) 12/03/2020   CHOLHDL 5 12/03/2020   Atorvastatin 10 mg daily  Avoids greasy and fatty food -now has an inside grill to cook meat on  Grilled chicken often Fruit and veggies   Patient Active Problem List   Diagnosis Date Noted   Hyperlipidemia associated with type 2 diabetes mellitus ([place]) 08/17/2018   Hypertrophic toenail 02/02/2018   Anxiety disorder 09/12/2015   Low HDL (under 40) 10/24/2014   Routine general medical examination at a health care facility 03/08/2014   History of endometrial cancer 12/15/2012   Colon cancer screening 11/01/2012   DM type 2 (diabetes mellitus, type 2) ([place]) 06/30/2012   Varicose veins of leg with swelling, left 11/18/2011   Morbid obesity ([place]) 11/12/2010   Essential hypertension 07/12/2009   Hypothyroidism 05/16/2009   Past Medical History:  Diagnosis Date   Anxiety    Breast lesion    benign   Diabetes mellitus without complication (HCC)    Elevated transaminase level    ? fatty liver    History of endometrial cancer 12/15/2012   FIGO Grade 2 endometrioid adenocarcinoma with squamous  differentiation involving the mucosa only.  From SGO Guidelines:  Recommended surveillance after treatment of endometrial cancer includes a follow-up visit every 3-6 months for 2 years, then every 6 months for 3 years, and annually thereafter. Each follow-up visit should include a thorough patient history; elicitation and investigation of any new symptoms associated with recurrence, such as vaginal bleeding, pelvic pain, weight loss, or lethargy; and a thorough speculum, pelvic, and rectovaginal examination   HTN (hypertension)    Hypothyroid    UTI (urinary tract infection)    Past Surgical History:  Procedure Laterality Date   ABDOMINAL HYSTERECTOMY     BREAST BIOPSY     benign cyst 2011   CESAREAN SECTION     LAPAROSCOPIC TOTAL HYSTERECTOMY  11/2012   Social History   Tobacco Use    Smoking status: Former    Types: Cigarettes    Quit date: 07/22/1979    Years since quitting: 41.6   Smokeless tobacco: Never   Tobacco comments:    briefly smoked at 31  Substance Use Topics   Alcohol use: No    Alcohol/week: 0.0 standard drinks    Comment: rarely   Drug use: No   Family History  Problem Relation Age of Onset   Hypothyroidism Mother    Alzheimer's disease Mother    Stroke Father    Deep vein thrombosis Sister    Colon cancer Neg Hx    Esophageal cancer Neg Hx    Stomach cancer Neg Hx    Rectal cancer Neg Hx    No Known Allergies Current Outpatient Medications on File Prior to Visit  Medication Sig Dispense Refill   atorvastatin (LIPITOR) 10 MG tablet Take 1 tablet (10 mg total) by mouth daily. In evening with a low fat snack 90 tablet 3   busPIRone (BUSPAR) 15 MG tablet Take 0.5 tablets (7.5 mg total) by mouth 2 (two) times daily. 90 tablet 3   glimepiride (AMARYL) 4 MG tablet Take 1 tablet (4 mg total) by mouth daily before breakfast. 90 tablet 1   levothyroxine (SYNTHROID) 125 MCG tablet TAKE 1 TABLET BY MOUTH EVERY DAY 90 tablet 3   losartan (COZAAR) 100 MG tablet Take 0.5 tablets (50 mg total) by mouth daily. 45 tablet 3   metFORMIN (GLUCOPHAGE) 1000 MG tablet Take 1 tablet (1,000 mg total) by mouth 2 (two) times daily with a meal. 180 tablet 3   No current facility-administered medications on file prior to visit.    Review of Systems  Constitutional:  Negative for activity change, appetite change, fatigue, fever and unexpected weight change.       Feels much better now not working  HENT:  Negative for congestion, ear pain, rhinorrhea, sinus pressure and sore throat.   Eyes:  Negative for pain, redness and visual disturbance.  Respiratory:  Negative for cough, shortness of breath and wheezing.   Cardiovascular:  Negative for chest pain and palpitations.  Gastrointestinal:  Negative for abdominal pain, blood in stool, constipation and diarrhea.   Endocrine: Negative for polydipsia and polyuria.  Genitourinary:  Negative for dysuria, frequency and urgency.  Musculoskeletal:  Negative for arthralgias, back pain and myalgias.  Skin:  Negative for pallor and rash.  Allergic/Immunologic: Negative for environmental allergies.  Neurological:  Negative for dizziness, syncope and headaches.  Hematological:  Negative for adenopathy. Does not bruise/bleed easily.  Psychiatric/Behavioral:  Negative for decreased concentration and dysphoric mood. The patient is not nervous/anxious.       Objective:   Physical  Exam Constitutional:      General: She is not in acute distress.    Appearance: Normal appearance. She is obese. She is not ill-appearing.  Eyes:     General: No scleral icterus.    Conjunctiva/sclera: Conjunctivae normal.     Pupils: Pupils are equal, round, and reactive to light.  Neck:     Vascular: No carotid bruit.  Cardiovascular:     Rate and Rhythm: Normal rate and regular rhythm.     Pulses: Normal pulses.     Heart sounds: Normal heart sounds.  Pulmonary:     Effort: Pulmonary effort is normal. No respiratory distress.     Breath sounds: Normal breath sounds. No wheezing or rales.  Musculoskeletal:     Cervical back: Normal range of motion and neck supple.     Right lower leg: No edema.     Left lower leg: No edema.  Lymphadenopathy:     Cervical: No cervical adenopathy.  Skin:    Comments: Many skin tags  Neurological:     Mental Status: She is alert.     Sensory: No sensory deficit.     Deep Tendon Reflexes: Reflexes normal.  Psychiatric:        Mood and Affect: Mood normal.          Assessment & Plan:   Problem List Items Addressed This Visit       Cardiovascular and Mediastinum   Essential hypertension    bp in fair control at this time  BP Readings from Last 1 Encounters:  03/05/21 134/84  No changes needed Most recent labs reviewed  Disc lifstyle change with low sodium diet and exercise   Plan to continue losartan 50 mg daily        Endocrine   DM type 2 (diabetes mellitus, type 2) ([place]) - Primary    Improved Lab Results  Component Value Date   HGBA1C 7.4 (A) 03/05/2021   Enc to keep working on diet and exercise and wt loss Tolerating amaryl 4 mg daily Continues metformin 1000 mg bid  Eye exam utd from June Encourage good foot care Will try to inc daily exercise       Relevant Orders   POCT glycosylated hemoglobin (Hb A1C) (Completed)   Hyperlipidemia associated with type 2 diabetes mellitus ([place])    Last LD of 99  Low HDL Enc more exercise  Disc goals for lipids and reasons to control them Rev last labs with pt Rev low sat fat diet in detail Plan to continue atorvastatin 10 mg daily  Eating more lean protein now

## 2021-03-05 NOTE — Patient Instructions (Addendum)
Try to get most of your carbohydrates from produce (with the exception of white potatoes)  Eat less bread/pasta/rice/snack foods/cereals/sweets and other items from the middle of the grocery store (processed carbs)  Every other day check glucose in am fasting  Every other day check glucose 2 hours after last meal of the day   Try and work up to 30 minutes of exercise daily (or more)  This will help blood sugar as well   A1c is down to 7.4  Goal is to get below 7  Any weight loss will help so keep working on it  Keep drinking your water

## 2021-03-05 NOTE — Assessment & Plan Note (Signed)
bp in fair control at this time  BP Readings from Last 1 Encounters:  03/05/21 134/84   No changes needed Most recent labs reviewed  Disc lifstyle change with low sodium diet and exercise  Plan to continue losartan 50 mg daily

## 2021-03-05 NOTE — Assessment & Plan Note (Signed)
Improved Lab Results  Component Value Date   HGBA1C 7.4 (A) 03/05/2021   Enc to keep working on diet and exercise and wt loss Tolerating amaryl 4 mg daily Continues metformin 1000 mg bid  Eye exam utd from June Encourage good foot care Will try to inc daily exercise

## 2021-03-05 NOTE — Assessment & Plan Note (Signed)
Last LD of 99  Low HDL Enc more exercise  Disc goals for lipids and reasons to control them Rev last labs with pt Rev low sat fat diet in detail Plan to continue atorvastatin 10 mg daily  Eating more lean protein now

## 2021-03-14 ENCOUNTER — Ambulatory Visit: Payer: 59 | Admitting: Podiatry

## 2021-04-01 ENCOUNTER — Encounter: Payer: Self-pay | Admitting: Podiatry

## 2021-04-01 ENCOUNTER — Ambulatory Visit: Payer: 59 | Admitting: Podiatry

## 2021-04-01 ENCOUNTER — Other Ambulatory Visit: Payer: Self-pay

## 2021-04-01 DIAGNOSIS — B351 Tinea unguium: Secondary | ICD-10-CM

## 2021-04-01 DIAGNOSIS — E119 Type 2 diabetes mellitus without complications: Secondary | ICD-10-CM

## 2021-04-01 DIAGNOSIS — M79674 Pain in right toe(s): Secondary | ICD-10-CM

## 2021-04-01 DIAGNOSIS — M79675 Pain in left toe(s): Secondary | ICD-10-CM | POA: Diagnosis not present

## 2021-04-01 NOTE — Progress Notes (Signed)
Complaint:  Visit Type: Patient returns to my office for continued preventative foot care services. Complaint: Patient states" my nails have grown long and thick and become painful to walk and wear shoes" Patient has been diagnosed with DM with no foot complications. The patient presents for preventative foot care services. No changes to ROS  Podiatric Exam: Vascular: dorsalis pedis and posterior tibial pulses are palpable bilateral. Capillary return is immediate. Temperature gradient is WNL. Skin turgor WNL  Sensorium: Normal Semmes Weinstein monofilament test. Normal tactile sensation bilaterally. Nail Exam: Pt has thick disfigured discolored nails with subungual debris noted bilateral entire nail hallux through fifth toenails Ulcer Exam: There is no evidence of ulcer or pre-ulcerative changes or infection. Orthopedic Exam: Muscle tone and strength are WNL. No limitations in general ROM. No crepitus or effusions noted. Foot type and digits show no abnormalities. Bony prominences are unremarkable. Skin:  Porokeratosis asymptomatic.. No infection or ulcers.    Diagnosis:  Onychomycosis, , Pain in right toe, pain in left toes.   Treatment & Plan Procedures and Treatment: Consent by patient was obtained for treatment procedures.   Debridement of mycotic and hypertrophic toenails, 1 through 5 bilateral and clearing of subungual debris. With nail nipper and dremel tool. No ulceration, no infection noted.  Return Visit-Office Procedure: Patient instructed to return to the office for a follow up visit 3 months for continued evaluation and treatment.    Samarah Hogle DPM 

## 2021-05-18 ENCOUNTER — Other Ambulatory Visit: Payer: Self-pay | Admitting: Internal Medicine

## 2021-05-18 ENCOUNTER — Other Ambulatory Visit: Payer: Self-pay | Admitting: Family Medicine

## 2021-06-27 ENCOUNTER — Encounter: Payer: Self-pay | Admitting: Internal Medicine

## 2021-06-27 ENCOUNTER — Ambulatory Visit (INDEPENDENT_AMBULATORY_CARE_PROVIDER_SITE_OTHER): Payer: 59 | Admitting: Internal Medicine

## 2021-06-27 ENCOUNTER — Other Ambulatory Visit: Payer: Self-pay

## 2021-06-27 VITALS — BP 128/82 | HR 82 | Ht 62.0 in | Wt 216.6 lb

## 2021-06-27 DIAGNOSIS — E039 Hypothyroidism, unspecified: Secondary | ICD-10-CM

## 2021-06-27 LAB — TSH: TSH: 0.39 u[IU]/mL (ref 0.35–5.50)

## 2021-06-27 LAB — T4, FREE: Free T4: 1.58 ng/dL (ref 0.60–1.60)

## 2021-06-27 NOTE — Progress Notes (Signed)
**Note De-Identified Taylor Obfuscation** Patient ID: Taylor Grimes, female   DOB: October 25, 1960, 60 y.o.   MRN: [id number]  This visit occurred during the SARS-CoV-2 public health emergency.  Safety protocols were in place, including screening questions prior to the visit, additional usage of staff PPE, and extensive cleaning of exam room while observing appropriate contact time as indicated for disinfecting solutions.   HPI  Taylor Grimes is a 61 y.o.-year-old female, returning for f/u for hypothyroidism, dx 2012. Last visit 1 year and 3 months ago. Her sister, Taylor Grimes, was also my pt. - she now moved to the [place].  Interim hx.: She retired since last [place]. No more stress, fatigue. She lost weight since last OV - 4 lbs. She has acid reflux, phlegm in throat.  Pt is on levothyroxine 125 mcg daily, taken: - in am - fasting - at least 30 min from b'fast - + calcium in the evening - no iron - no multivitamins - no PPIs - not on Biotin  TFTs reviewed: Lab Results  Component Value Date   TSH 0.42 09/07/2020   TSH 0.52 06/21/2020   TSH 3.59 08/19/2019   TSH 1.07 03/10/2019   TSH 7.75 (H) 08/02/2018   TSH 2.52 03/08/2018   TSH 2.69 02/24/2017   TSH 1.37 06/18/2016   TSH 1.50 01/14/2016   TSH 1.68 09/28/2014   FREET4 1.25 06/21/2020   FREET4 1.45 03/10/2019   FREET4 1.01 03/08/2018   FREET4 1.15 02/24/2017   FREET4 1.44 01/14/2016   FREET4 1.60 09/28/2014   FREET4 1.14 05/04/2014   FREET4 1.05 03/09/2014   FREET4 1.14 09/29/2013   FREET4 1.03 08/18/2013   Pt denies: - feeling nodules in neck - hoarseness - dysphagia - choking - SOB with lying down  Before last visit, she was waking up at 3 am to go to work and open [employer] at 4-7 am.  She limited her work days to 32 h per week. Her fatigue improved.  However, her fatigue improved significantly after retiring.  No FH of thyroid cancer. However, all women in her family: hypothyroidism. No FH of thyroid cancer. No h/o radiation tx to head or neck.  No herbal  supplements. No Biotin use. No recent steroids use.   ROS: + see HPI  I reviewed pt's medications, allergies, PMH, social hx, family hx, and changes were documented in the history of present illness. Otherwise, unchanged from my initial visit note.  Past Medical History:  Diagnosis Date   Anxiety    Breast lesion    benign   Diabetes mellitus without complication (HCC)    Elevated transaminase level    ? fatty liver    History of endometrial cancer 12/15/2012   FIGO Grade 2 endometrioid adenocarcinoma with squamous differentiation involving the mucosa only.  From SGO Guidelines:  Recommended surveillance after treatment of endometrial cancer includes a follow-up visit every 3-6 months for 2 years, then every 6 months for 3 years, and annually thereafter. Each follow-up visit should include a thorough patient history; elicitation and investigation of any new symptoms associated with recurrence, such as vaginal bleeding, pelvic pain, weight loss, or lethargy; and a thorough speculum, pelvic, and rectovaginal examination   HTN (hypertension)    Hypothyroid    UTI (urinary tract infection)    Past Surgical History:  Procedure Laterality Date   ABDOMINAL HYSTERECTOMY     BREAST BIOPSY     benign cyst 2011   CESAREAN SECTION     LAPAROSCOPIC TOTAL HYSTERECTOMY  11/2012  Social History   Socioeconomic History   Marital status: Legally Separated    Spouse name: Not on file   Number of children: 2   Years of education: Not on file   Highest education level: Not on file  Occupational History   Occupation: shift Programme researcher, broadcasting/film/video: [employer]  Tobacco Use   Smoking status: Former    Types: Cigarettes    Quit date: 07/22/1979    Years since quitting: 41.9   Smokeless tobacco: Never   Tobacco comments:    briefly smoked at 16  Substance and Sexual Activity   Alcohol use: No    Alcohol/week: 0.0 standard drinks    Comment: rarely   Drug use: No   Sexual activity: Yes     Birth control/protection: Post-menopausal  Other Topics Concern   Not on file  Social History Narrative   Not on file   Social Determinants of Health   Financial Resource Strain: Not on file  Food Insecurity: Not on file  Transportation Needs: Not on file  Physical Activity: Not on file  Stress: Not on file  Social Connections: Not on file  Intimate Partner Violence: Not on file   Current Outpatient Medications on File Prior to Visit  Medication Sig Dispense Refill   atorvastatin (LIPITOR) 10 MG tablet Take 1 tablet (10 mg total) by mouth daily. In evening with a low fat snack 90 tablet 3   busPIRone (BUSPAR) 15 MG tablet Take 0.5 tablets (7.5 mg total) by mouth 2 (two) times daily. 90 tablet 3   glimepiride (AMARYL) 4 MG tablet TAKE 1 TABLET BY MOUTH DAILY BEFORE BREAKFAST. 90 tablet 1   levothyroxine (SYNTHROID) 125 MCG tablet TAKE 1 TABLET BY MOUTH EVERY DAY 90 tablet 1   losartan (COZAAR) 100 MG tablet Take 0.5 tablets (50 mg total) by mouth daily. 45 tablet 3   metFORMIN (GLUCOPHAGE) 1000 MG tablet Take 1 tablet (1,000 mg total) by mouth 2 (two) times daily with a meal. 180 tablet 3   No current facility-administered medications on file prior to visit.   No Known Allergies Family History  Problem Relation Age of Onset   Hypothyroidism Mother    Alzheimer's disease Mother    Stroke Father    Deep vein thrombosis Sister    Colon cancer Neg Hx    Esophageal cancer Neg Hx    Stomach cancer Neg Hx    Rectal cancer Neg Hx     PE: BP 128/82 (BP Location: Right Arm, Patient Position: Sitting, Cuff Size: Normal)   Pulse 82   Ht 5\' 2"  (1.575 m)   Wt 216 lb 9.6 oz (98.2 kg)   LMP 07/21/2008   SpO2 97%   BMI 39.62 kg/m  Wt Readings from Last 3 Encounters:  06/27/21 216 lb 9.6 oz (98.2 kg)  03/05/21 220 lb 7 oz (100 kg)  01/17/21 221 lb (100.2 kg)   Constitutional: overweight, in NAD Eyes: PERRLA, EOMI, no exophthalmos ENT: moist mucous membranes, no thyromegaly,  no cervical lymphadenopathy Cardiovascular: RRR, No MRG Respiratory: CTA B Musculoskeletal: no deformities, strength intact in all 4 Skin: moist, warm, no rashes Neurological: no tremor with outstretched hands, DTR normal in all 4  ASSESSMENT: 1. Hypothyroidism  PLAN:  1. Patient with acquired hypothyroidism, well-controlled lately, on levothyroxine. - latest thyroid labs reviewed with pt. >> normal: Lab Results  Component Value Date   TSH 0.42 09/07/2020  - she continues on LT4 125 mcg daily - pt feels  good on this dose, much better after retirement. - we discussed about taking the thyroid hormone every day, with water, >30 minutes before breakfast, separated by >4 hours from acid reflux medications, calcium, iron, multivitamins. Pt. is taking it correctly. - will check thyroid tests today: TSH and fT4 - If labs are abnormal, she will need to return for repeat TFTs in 1.5 months - OTW, I will see her back in a year  Component     Latest Ref Rng & Units 06/27/2021  TSH     0.35 - 5.50 uIU/mL 0.39  T4,Free(Direct)     0.60 - 1.60 ng/dL 1.58  Labs are normal, but TSH is close to the lower limit of normal.  I would suggest to come back in 4 months for another TFT check.  Philemon Kingdom, MD PhD [facility] Endocrinology

## 2021-06-27 NOTE — Patient Instructions (Signed)
Please continue Levothyroxine 125 mcg daily.  Take the thyroid hormone every day, with water, at least 30 minutes before breakfast, separated by at least 4 hours from: - acid reflux medications - calcium - iron - multivitamins  Please stop at the lab.  Please come back for a follow-up appointment in 1 year.

## 2021-07-04 ENCOUNTER — Ambulatory Visit: Payer: 59 | Admitting: Podiatry

## 2021-08-15 ENCOUNTER — Ambulatory Visit: Payer: 59 | Admitting: Podiatry

## 2021-08-16 ENCOUNTER — Other Ambulatory Visit: Payer: Self-pay

## 2021-08-16 ENCOUNTER — Inpatient Hospital Stay
Admission: EM | Admit: 2021-08-16 | Discharge: 2021-08-19 | DRG: 854 | Disposition: A | Discharge status: Home / Self Care | Payer: 59 | Attending: Internal Medicine | Admitting: Internal Medicine

## 2021-08-16 ENCOUNTER — Encounter
Admission: EM | Disposition: A | Discharge status: Home / Self Care | Payer: Self-pay | Source: Home / Self Care | Attending: Internal Medicine

## 2021-08-16 ENCOUNTER — Inpatient Hospital Stay: Payer: Self-pay | Admitting: Anesthesiology

## 2021-08-16 ENCOUNTER — Emergency Department: Payer: Self-pay

## 2021-08-16 DIAGNOSIS — E1165 Type 2 diabetes mellitus with hyperglycemia: Secondary | ICD-10-CM

## 2021-08-16 DIAGNOSIS — Z8542 Personal history of malignant neoplasm of other parts of uterus: Secondary | ICD-10-CM

## 2021-08-16 DIAGNOSIS — F419 Anxiety disorder, unspecified: Secondary | ICD-10-CM | POA: Diagnosis present

## 2021-08-16 DIAGNOSIS — E039 Hypothyroidism, unspecified: Secondary | ICD-10-CM | POA: Diagnosis present

## 2021-08-16 DIAGNOSIS — E66811 Obesity, class 1: Secondary | ICD-10-CM

## 2021-08-16 DIAGNOSIS — L02211 Cutaneous abscess of abdominal wall: Secondary | ICD-10-CM | POA: Diagnosis present

## 2021-08-16 DIAGNOSIS — E6609 Other obesity due to excess calories: Secondary | ICD-10-CM

## 2021-08-16 DIAGNOSIS — Z87440 Personal history of urinary (tract) infections: Secondary | ICD-10-CM

## 2021-08-16 DIAGNOSIS — Z7984 Long term (current) use of oral hypoglycemic drugs: Secondary | ICD-10-CM

## 2021-08-16 DIAGNOSIS — A419 Sepsis, unspecified organism: Secondary | ICD-10-CM

## 2021-08-16 DIAGNOSIS — Z87891 Personal history of nicotine dependence: Secondary | ICD-10-CM

## 2021-08-16 DIAGNOSIS — N12 Tubulo-interstitial nephritis, not specified as acute or chronic: Secondary | ICD-10-CM | POA: Diagnosis present

## 2021-08-16 DIAGNOSIS — E11649 Type 2 diabetes mellitus with hypoglycemia without coma: Secondary | ICD-10-CM | POA: Diagnosis present

## 2021-08-16 DIAGNOSIS — L03311 Cellulitis of abdominal wall: Secondary | ICD-10-CM | POA: Diagnosis present

## 2021-08-16 DIAGNOSIS — Z79899 Other long term (current) drug therapy: Secondary | ICD-10-CM

## 2021-08-16 DIAGNOSIS — Z79890 Hormone replacement therapy (postmenopausal): Secondary | ICD-10-CM

## 2021-08-16 DIAGNOSIS — Z6836 Body mass index (BMI) 36.0-36.9, adult: Secondary | ICD-10-CM

## 2021-08-16 DIAGNOSIS — I10 Essential (primary) hypertension: Secondary | ICD-10-CM | POA: Diagnosis present

## 2021-08-16 DIAGNOSIS — A409 Streptococcal sepsis, unspecified: Principal | ICD-10-CM | POA: Diagnosis present

## 2021-08-16 DIAGNOSIS — B369 Superficial mycosis, unspecified: Secondary | ICD-10-CM | POA: Diagnosis present

## 2021-08-16 DIAGNOSIS — Z6841 Body Mass Index (BMI) 40.0 and over, adult: Secondary | ICD-10-CM

## 2021-08-16 DIAGNOSIS — Z20822 Contact with and (suspected) exposure to covid-19: Secondary | ICD-10-CM | POA: Diagnosis present

## 2021-08-16 HISTORY — PX: INCISION AND DRAINAGE ABSCESS: SHX5864

## 2021-08-16 LAB — URINALYSIS, COMPLETE (UACMP) WITH MICROSCOPIC
Glucose, UA: 500 mg/dL — AB
Ketones, ur: 15 mg/dL — AB
Nitrite: POSITIVE — AB
Protein, ur: 30 mg/dL — AB
Specific Gravity, Urine: <1.005 — ABNORMAL LOW (ref 1.005–1.030)
WBC, UA: >50 WBC/hpf (ref 0–5)
pH: 6 (ref 5.0–8.0)

## 2021-08-16 LAB — RESP PANEL BY RT-PCR (FLU A&B, COVID) ARPGX2
Influenza A by PCR: NEGATIVE
Influenza B by PCR: NEGATIVE
SARS Coronavirus 2 by RT PCR: NEGATIVE

## 2021-08-16 LAB — LACTIC ACID, PLASMA
Lactic Acid, Venous: 2.7 mmol/L (ref 0.5–1.9)
Lactic Acid, Venous: 2.5 mmol/L (ref 0.5–1.9)
Lactic Acid, Venous: 1.6 mmol/L (ref 0.5–1.9)
Lactic Acid, Venous: 1.5 mmol/L (ref 0.5–1.9)

## 2021-08-16 LAB — CBC WITH DIFFERENTIAL/PLATELET
Abs Immature Granulocytes: 0.11 10*3/uL — ABNORMAL HIGH (ref 0.00–0.07)
Basophils Absolute: 0.1 10*3/uL (ref 0.0–0.1)
Basophils Relative: 0 %
Eosinophils Absolute: 0 10*3/uL (ref 0.0–0.5)
Eosinophils Relative: 0 %
HCT: 39.6 % (ref 36.0–46.0)
Hemoglobin: 13.2 g/dL (ref 12.0–15.0)
Immature Granulocytes: 1 %
Lymphocytes Relative: 7 %
Lymphs Abs: 1.1 10*3/uL (ref 0.7–4.0)
MCH: 30.5 pg (ref 26.0–34.0)
MCHC: 33.3 g/dL (ref 30.0–36.0)
MCV: 91.5 fL (ref 80.0–100.0)
Monocytes Absolute: 1.7 10*3/uL — ABNORMAL HIGH (ref 0.1–1.0)
Monocytes Relative: 11 %
Neutro Abs: 12.4 10*3/uL — ABNORMAL HIGH (ref 1.7–7.7)
Neutrophils Relative %: 81 %
Platelets: 196 10*3/uL (ref 150–400)
RBC: 4.33 MIL/uL (ref 3.87–5.11)
RDW: 12.5 % (ref 11.5–15.5)
WBC: 15.4 10*3/uL — ABNORMAL HIGH (ref 4.0–10.5)
nRBC: 0 % (ref 0.0–0.2)

## 2021-08-16 LAB — COMPREHENSIVE METABOLIC PANEL
ALT: 22 U/L (ref 0–44)
AST: 29 U/L (ref 15–41)
Albumin: 3.1 g/dL — ABNORMAL LOW (ref 3.5–5.0)
Alkaline Phosphatase: 114 U/L (ref 38–126)
Anion gap: 10 (ref 5–15)
BUN: 16 mg/dL (ref 8–23)
CO2: 22 mmol/L (ref 22–32)
Calcium: 8.5 mg/dL — ABNORMAL LOW (ref 8.9–10.3)
Chloride: 101 mmol/L (ref 98–111)
Creatinine, Ser: 1.03 mg/dL — ABNORMAL HIGH (ref 0.44–1.00)
GFR, Estimated: >60 mL/min (ref >60)
Glucose, Bld: 358 mg/dL — ABNORMAL HIGH (ref 70–99)
Potassium: 3.6 mmol/L (ref 3.5–5.1)
Sodium: 133 mmol/L — ABNORMAL LOW (ref 135–145)
Total Bilirubin: 3.1 mg/dL — ABNORMAL HIGH (ref 0.3–1.2)
Total Protein: 7.7 g/dL (ref 6.5–8.1)

## 2021-08-16 LAB — CBG MONITORING, ED
Glucose-Capillary: 293 mg/dL — ABNORMAL HIGH (ref 70–99)
Glucose-Capillary: 269 mg/dL — ABNORMAL HIGH (ref 70–99)

## 2021-08-16 LAB — GLUCOSE, CAPILLARY
Glucose-Capillary: 230 mg/dL — ABNORMAL HIGH (ref 70–99)
Glucose-Capillary: 225 mg/dL — ABNORMAL HIGH (ref 70–99)

## 2021-08-16 LAB — PROTIME-INR
INR: 1.1 (ref 0.8–1.2)
Prothrombin Time: 13.9 seconds (ref 11.4–15.2)

## 2021-08-16 LAB — APTT: aPTT: 27 seconds (ref 24–36)

## 2021-08-16 SURGERY — INCISION AND DRAINAGE, ABSCESS
Anesthesia: General | Site: Abdomen | Laterality: Right

## 2021-08-16 MED ORDER — HYDROCODONE-ACETAMINOPHEN 5-325 MG PO TABS
1.0000 | ORAL_TABLET | ORAL | Status: DC | PRN
  Administered 2021-08-17 (×2): 1 via ORAL
  Filled 2021-08-16 (×2): qty 1

## 2021-08-16 MED ORDER — HYDROMORPHONE HCL 1 MG/ML IJ SOLN
INTRAMUSCULAR | Status: AC
  Filled 2021-08-16: qty 1

## 2021-08-16 MED ORDER — OXYCODONE HCL 5 MG/5ML PO SOLN: 5.0000 mg | Freq: Once | ORAL | Status: DC | PRN

## 2021-08-16 MED ORDER — HYDROMORPHONE HCL 1 MG/ML IJ SOLN
1.0000 mg | INTRAMUSCULAR | Status: DC | PRN
  Administered 2021-08-17: 1 mg via INTRAVENOUS
  Filled 2021-08-16: qty 1

## 2021-08-16 MED ORDER — INSULIN ASPART 100 UNIT/ML IJ SOLN
0.0000 [IU] | Freq: Every day | INTRAMUSCULAR | Status: DC
  Administered 2021-08-16: 2 [IU] via SUBCUTANEOUS
  Filled 2021-08-16: qty 1

## 2021-08-16 MED ORDER — ATORVASTATIN CALCIUM 10 MG PO TABS
10.0000 mg | ORAL_TABLET | Freq: Every evening | ORAL | Status: DC
Start: 2021-08-19 — End: 2021-08-19

## 2021-08-16 MED ORDER — ONDANSETRON HCL 4 MG/2ML IJ SOLN: 4.0000 mg | Freq: Once | INTRAMUSCULAR | Status: DC | PRN

## 2021-08-16 MED ORDER — ENOXAPARIN SODIUM 60 MG/0.6ML IJ SOSY
0.5000 mg/kg | PREFILLED_SYRINGE | INTRAMUSCULAR | Status: DC
  Administered 2021-08-16 – 2021-08-18 (×3): 50 mg via SUBCUTANEOUS
  Filled 2021-08-16 (×2): qty 0.6
  Filled 2021-08-16: qty 0.5
  Filled 2021-08-16: qty 0.6

## 2021-08-16 MED ORDER — FENTANYL CITRATE (PF) 100 MCG/2ML IJ SOLN: 25.0000 ug | INTRAMUSCULAR | Status: DC | PRN

## 2021-08-16 MED ORDER — SODIUM CHLORIDE 0.9 % IV SOLN
1.0000 g | Freq: Once | INTRAVENOUS | Status: AC
  Administered 2021-08-16: 1 g via INTRAVENOUS
  Filled 2021-08-16: qty 10

## 2021-08-16 MED ORDER — VANCOMYCIN HCL IN DEXTROSE 1-5 GM/200ML-% IV SOLN: 1000.0000 mg | Freq: Once | INTRAVENOUS | Status: DC

## 2021-08-16 MED ORDER — HYDROMORPHONE HCL 1 MG/ML IJ SOLN
INTRAMUSCULAR | Status: DC | PRN
Start: 2021-08-16 — End: 2021-08-16
  Administered 2021-08-16 (×2): .5 mg via INTRAVENOUS

## 2021-08-16 MED ORDER — BUSPIRONE HCL 15 MG PO TABS
7.5000 mg | ORAL_TABLET | Freq: Two times a day (BID) | ORAL | Status: DC
  Administered 2021-08-16 – 2021-08-19 (×5): 7.5 mg via ORAL
  Filled 2021-08-16 (×7): qty 1

## 2021-08-16 MED ORDER — INSULIN ASPART 100 UNIT/ML IJ SOLN
0.0000 [IU] | Freq: Three times a day (TID) | INTRAMUSCULAR | Status: DC
  Administered 2021-08-16: 5 [IU] via SUBCUTANEOUS
  Administered 2021-08-18: 2 [IU] via SUBCUTANEOUS
  Administered 2021-08-18: 3 [IU] via SUBCUTANEOUS
  Administered 2021-08-19: 2 [IU] via SUBCUTANEOUS
  Filled 2021-08-16 (×5): qty 1

## 2021-08-16 MED ORDER — 0.9 % SODIUM CHLORIDE (POUR BTL) OPTIME
TOPICAL | Status: DC | PRN
  Administered 2021-08-16: 550 mL

## 2021-08-16 MED ORDER — ACETAMINOPHEN 10 MG/ML IV SOLN
1000.0000 mg | Freq: Once | INTRAVENOUS | Status: DC | PRN
  Administered 2021-08-16: 1000 mg via INTRAVENOUS

## 2021-08-16 MED ORDER — OXYCODONE HCL 5 MG PO TABS: 5.0000 mg | ORAL_TABLET | Freq: Once | ORAL | Status: DC | PRN

## 2021-08-16 MED ORDER — VANCOMYCIN HCL IN DEXTROSE 1-5 GM/200ML-% IV SOLN: 1000.0000 mg | INTRAVENOUS | Status: DC

## 2021-08-16 MED ORDER — INSULIN ASPART 100 UNIT/ML IJ SOLN
5.0000 [IU] | Freq: Once | INTRAMUSCULAR | Status: AC
  Administered 2021-08-16: 5 [IU] via SUBCUTANEOUS

## 2021-08-16 MED ORDER — VANCOMYCIN HCL IN DEXTROSE 1-5 GM/200ML-% IV SOLN
1000.0000 mg | Freq: Once | INTRAVENOUS | Status: AC
  Administered 2021-08-16: 1000 mg via INTRAVENOUS

## 2021-08-16 MED ORDER — SODIUM CHLORIDE 0.9 % IV BOLUS
1000.0000 mL | Freq: Once | INTRAVENOUS | Status: AC
  Administered 2021-08-16: 1000 mL via INTRAVENOUS

## 2021-08-16 MED ORDER — VANCOMYCIN HCL IN DEXTROSE 1-5 GM/200ML-% IV SOLN
1000.0000 mg | Freq: Once | INTRAVENOUS | Status: DC
  Filled 2021-08-16: qty 200

## 2021-08-16 MED ORDER — LACTATED RINGERS IV SOLN: INTRAVENOUS | Status: DC

## 2021-08-16 MED ORDER — BUPIVACAINE-EPINEPHRINE (PF) 0.25% -1:200000 IJ SOLN
INTRAMUSCULAR | Status: AC
  Filled 2021-08-16: qty 30

## 2021-08-16 MED ORDER — INSULIN ASPART 100 UNIT/ML IJ SOLN
INTRAMUSCULAR | Status: AC
  Administered 2021-08-17: 1 [IU] via SUBCUTANEOUS
  Filled 2021-08-16: qty 1

## 2021-08-16 MED ORDER — LEVOTHYROXINE SODIUM 50 MCG PO TABS
125.0000 ug | ORAL_TABLET | Freq: Every day | ORAL | Status: DC
  Administered 2021-08-17 – 2021-08-19 (×3): 125 ug via ORAL
  Filled 2021-08-16 (×3): qty 1

## 2021-08-16 MED ORDER — MIDAZOLAM HCL 2 MG/2ML IJ SOLN
INTRAMUSCULAR | Status: AC
  Filled 2021-08-16: qty 2

## 2021-08-16 MED ORDER — PHENYLEPHRINE HCL (PRESSORS) 10 MG/ML IV SOLN
INTRAVENOUS | Status: DC | PRN
Start: 2021-08-16 — End: 2021-08-16
  Administered 2021-08-16 (×2): 100 ug via INTRAVENOUS

## 2021-08-16 MED ORDER — LIDOCAINE HCL (CARDIAC) PF 100 MG/5ML IV SOSY
PREFILLED_SYRINGE | INTRAVENOUS | Status: DC | PRN
  Administered 2021-08-16: 80 mg via INTRAVENOUS

## 2021-08-16 MED ORDER — VANCOMYCIN HCL IN DEXTROSE 1-5 GM/200ML-% IV SOLN
1000.0000 mg | INTRAVENOUS | Status: DC
  Administered 2021-08-16: 1000 mg via INTRAVENOUS
  Filled 2021-08-16 (×2): qty 200

## 2021-08-16 MED ORDER — PROPOFOL 10 MG/ML IV BOLUS
INTRAVENOUS | Status: DC | PRN
  Administered 2021-08-16: 150 mg via INTRAVENOUS

## 2021-08-16 MED ORDER — FENTANYL CITRATE (PF) 100 MCG/2ML IJ SOLN
INTRAMUSCULAR | Status: AC
  Filled 2021-08-16: qty 2

## 2021-08-16 MED ORDER — MIDAZOLAM HCL 2 MG/2ML IJ SOLN
INTRAMUSCULAR | Status: DC | PRN
Start: 2021-08-16 — End: 2021-08-16
  Administered 2021-08-16: 2 mg via INTRAVENOUS

## 2021-08-16 MED ORDER — ONDANSETRON HCL 4 MG/2ML IJ SOLN
INTRAMUSCULAR | Status: DC | PRN
  Administered 2021-08-16: 4 mg via INTRAVENOUS

## 2021-08-16 MED ORDER — ACETAMINOPHEN 10 MG/ML IV SOLN
INTRAVENOUS | Status: AC
  Filled 2021-08-16: qty 100

## 2021-08-16 MED ORDER — IOHEXOL 300 MG/ML  SOLN
100.0000 mL | Freq: Once | INTRAMUSCULAR | Status: AC | PRN
  Administered 2021-08-16: 100 mL via INTRAVENOUS
  Filled 2021-08-16: qty 100

## 2021-08-16 MED ORDER — SODIUM CHLORIDE 0.9 % IV SOLN
INTRAVENOUS | Status: DC | PRN
Start: 2021-08-16 — End: 2021-08-16

## 2021-08-16 MED ORDER — FLUCONAZOLE 100 MG PO TABS
200.0000 mg | ORAL_TABLET | Freq: Every day | ORAL | Status: DC
  Administered 2021-08-17 – 2021-08-19 (×3): 200 mg via ORAL
  Filled 2021-08-16 (×4): qty 2

## 2021-08-16 MED ORDER — FENTANYL CITRATE (PF) 100 MCG/2ML IJ SOLN
INTRAMUSCULAR | Status: DC | PRN
  Administered 2021-08-16 (×2): 25 ug via INTRAVENOUS

## 2021-08-16 SURGICAL SUPPLY — 33 items
BLADE SURG 15 STRL LF DISP TIS (BLADE) ×1 IMPLANT
BLADE SURG 15 STRL SS (BLADE) ×2
CANISTER WOUND CARE 500ML ATS (WOUND CARE) ×1 IMPLANT
DRAIN PENROSE 12X.25 LTX STRL (MISCELLANEOUS) IMPLANT
DRAIN PENROSE 5/8X18 LTX STRL (DRAIN) IMPLANT
DRAPE LAPAROTOMY 77X122 PED (DRAPES) ×2 IMPLANT
DRSG VAC ATS MED SENSATRAC (GAUZE/BANDAGES/DRESSINGS) ×1 IMPLANT
ELECT CAUTERY BLADE 6.4 (BLADE) ×2 IMPLANT
ELECT REM PT RETURN 9FT ADLT (ELECTROSURGICAL) ×2
ELECTRODE REM PT RTRN 9FT ADLT (ELECTROSURGICAL) ×1 IMPLANT
GAUZE 4X4 16PLY ~~LOC~~+RFID DBL (SPONGE) ×2 IMPLANT
GAUZE SPONGE 4X4 12PLY STRL (GAUZE/BANDAGES/DRESSINGS) IMPLANT
GLOVE SURG ORTHO LTX SZ7.5 (GLOVE) ×2 IMPLANT
GOWN STRL REUS W/ TWL LRG LVL3 (GOWN DISPOSABLE) ×2 IMPLANT
GOWN STRL REUS W/TWL LRG LVL3 (GOWN DISPOSABLE) ×4
KIT TURNOVER KIT A (KITS) ×2 IMPLANT
MANIFOLD NEPTUNE II (INSTRUMENTS) ×2 IMPLANT
NEEDLE HYPO 22GX1.5 SAFETY (NEEDLE) ×2 IMPLANT
NS IRRIG 1000ML POUR BTL (IV SOLUTION) ×2 IMPLANT
PACK BASIN MINOR ARMC (MISCELLANEOUS) ×2 IMPLANT
PAD ABD DERMACEA PRESS 5X9 (GAUZE/BANDAGES/DRESSINGS) IMPLANT
SOL PREP PVP 2OZ (MISCELLANEOUS) ×2
SOLUTION PREP PVP 2OZ (MISCELLANEOUS) ×1 IMPLANT
SPONGE T-LAP 18X18 ~~LOC~~+RFID (SPONGE) ×4 IMPLANT
SUT ETHILON 3-0 FS-10 30 BLK (SUTURE)
SUT VIC AB 2-0 SH 27 (SUTURE) ×2
SUT VIC AB 2-0 SH 27XBRD (SUTURE) IMPLANT
SUT VICRYL 0 AB UR-6 (SUTURE) ×1 IMPLANT
SUTURE EHLN 3-0 FS-10 30 BLK (SUTURE) IMPLANT
SWAB CULTURE AMIES ANAERIB BLU (MISCELLANEOUS) ×2 IMPLANT
SYR 10ML LL (SYRINGE) ×2 IMPLANT
SYR BULB IRRIG 60ML STRL (SYRINGE) ×2 IMPLANT
WATER STERILE IRR 500ML POUR (IV SOLUTION) ×2 IMPLANT

## 2021-08-16 NOTE — ED Notes (Signed)
Surgery PA at bedside.  

## 2021-08-16 NOTE — Consult Note (Signed)
° °  08/16/21 3:35 PM   Taylor Grimes [birth year] [id number]  Urology is asked to comment on incidental finding of gas in the bladder and within the left renal collecting system on CT today.  Patient currently in OR with general surgery for incision and drainage/debridement of necrotizing abscess of abdominal wall/pannus.  Per general surgery note, no UTI symptoms or flank pain.  Urinalysis contaminated with squamous cells, but suspicious for infection.  I personally viewed and interpreted the CT abdomen and pelvis with contrast that shows no hydronephrosis or stones, gas in the bladder, and small focus of gas in the left upper pole.  No obvious evidence of colovesical fistula.  No indication for urologic intervention Recommend continuing antibiotics, follow-up urine culture Contact urology on call if further questions arise  Nickolas Madrid, MD 08/16/2021  [place] [address] [place], [place] [address] [phone]

## 2021-08-16 NOTE — Anesthesia Preprocedure Evaluation (Addendum)
Anesthesia Evaluation  Patient identified by MRN, date of birth, ID band Patient awake    Reviewed: Allergy & Precautions, NPO status , Patient's Chart, lab work & pertinent test results  History of Anesthesia Complications Negative for: history of anesthetic complications  Airway Mallampati: I   Neck ROM: Full    Dental   Missing few molars:   Pulmonary former smoker (quit 1981),    Pulmonary exam normal breath sounds clear to auscultation       Cardiovascular hypertension, Normal cardiovascular exam Rhythm:Regular Rate:Normal     Neuro/Psych PSYCHIATRIC DISORDERS Anxiety negative neurological ROS     GI/Hepatic negative GI ROS,   Endo/Other  diabetes, Type 2Hypothyroidism Class 3 obesity  Renal/GU negative Renal ROS     Musculoskeletal   Abdominal   Peds  Hematology negative hematology ROS (+)   Anesthesia Other Findings   Reproductive/Obstetrics                            Anesthesia Physical Anesthesia Plan  ASA: 3  Anesthesia Plan: General   Post-op Pain Management:    Induction: Intravenous  PONV Risk Score and Plan: 3 and Ondansetron, Dexamethasone and Treatment may vary due to age or medical condition  Airway Management Planned: LMA  Additional Equipment:   Intra-op Plan:   Post-operative Plan: Extubation in OR  Informed Consent: I have reviewed the patients History and Physical, chart, labs and discussed the procedure including the risks, benefits and alternatives for the proposed anesthesia with the patient or authorized representative who has indicated his/her understanding and acceptance.     Dental advisory given  Plan Discussed with: CRNA  Anesthesia Plan Comments: (Patient consented for risks of anesthesia including but not limited to:  - adverse reactions to medications - damage to eyes, teeth, lips or other oral mucosa - nerve damage due to positioning   - sore throat or hoarseness - damage to heart, brain, nerves, lungs, other parts of body or loss of life  Informed patient about role of CRNA in peri- and intra-operative care.  Patient voiced understanding.)       Anesthesia Quick Evaluation

## 2021-08-16 NOTE — Anesthesia Procedure Notes (Signed)
Procedure Name: LMA Insertion Date/Time: 08/16/2021 1:52 PM Performed by: Lorie Apley, CRNA Pre-anesthesia Checklist: Patient identified, Patient being monitored, Timeout performed, Emergency Drugs available and Suction available Patient Re-evaluated:Patient Re-evaluated prior to induction Oxygen Delivery Method: Circle system utilized Preoxygenation: Pre-oxygenation with 100% oxygen Induction Type: IV induction Ventilation: Mask ventilation without difficulty LMA: LMA inserted LMA Size: 5.0 Tube type: Oral Number of attempts: 1 Placement Confirmation: positive ETCO2 and breath sounds checked- equal and bilateral Tube secured with: Tape Dental Injury: Teeth and Oropharynx as per pre-operative assessment

## 2021-08-16 NOTE — Progress Notes (Addendum)
Pharmacy Antibiotic Note  Taylor Grimes is a 61 y.o. female admitted on 08/16/2021 with cellulitis.  Pharmacy has been consulted for vancomycin dosing.  Plan: Pt scheduled to receive vancomycin 2 g IV x 1 loading dose in ED but has only received 1 of 2 g. Will start maintenance regimen tonight of 1g q24h so pt gets total of 2 g today.  Est AUC: 514 Used: Scr 1.03, Vd 0.5, IBW Obtain vanc levels around 4th or 5th dose if continued Follow up cultures  Height: 5\' 1"  (154.9 cm) Weight: 97.5 kg (215 lb) IBW/kg (Calculated) : 47.8  Temp (24hrs), Avg:99.5 F (37.5 C), Min:98.6 F (37 C), Max:100.6 F (38.1 C)  Recent Labs  Lab 08/16/21 1009 08/16/21 1200  WBC 15.4*  --   CREATININE 1.03*  --   LATICACIDVEN 2.7* 2.5*    Estimated Creatinine Clearance: 60.5 mL/min (A) (by C-G formula based on SCr of 1.03 mg/dL (H)).    No Known Allergies  Antimicrobials this admission: 1/27 Vancomycin    Microbiology results: 1/27 BCx: sent 1/27 WCx: sent  1/27 Ucx: sent    Thank you for allowing pharmacy to be a part of this patients care.  Forde Dandy Latessa Tillis 08/16/2021 4:59 PM

## 2021-08-16 NOTE — Anesthesia Postprocedure Evaluation (Signed)
Anesthesia Post Note  Patient: Taylor Grimes  Procedure(s) Performed: INCISION AND DRAINAGE ABDOMINAL WALL ABSCESS (Right: Abdomen)  Patient location during evaluation: PACU Anesthesia Type: General Level of consciousness: awake and alert Pain management: pain level controlled Vital Signs Assessment: post-procedure vital signs reviewed and stable Respiratory status: spontaneous breathing, nonlabored ventilation, respiratory function stable and patient connected to nasal cannula oxygen Cardiovascular status: blood pressure returned to baseline and stable Postop Assessment: no apparent nausea or vomiting Anesthetic complications: no   No notable events documented.   Last Vitals:  Vitals:   08/16/21 1500 08/16/21 1530  BP:  100/70  Pulse:  88  Resp:  13  Temp: 37.3 C 37.2 C  SpO2:  98%    Last Pain:  Vitals:   08/16/21 1530  TempSrc:   PainSc: 2                  Martha Clan

## 2021-08-16 NOTE — ED Notes (Signed)
Informed RN bed assigned 

## 2021-08-16 NOTE — ED Provider Notes (Addendum)
[facility] Provider Note    Event Date/Time   First MD Initiated Contact with Patient 08/16/21 0940     (approximate)   History   Abscess   HPI  Taylor Grimes is a 61 y.o. female presents emergency department complaining of a abscess on her abdomen.  Patient has history of diabetes in which she takes pills not insulin, states she has not felt well all week.  Noticed the bump on Sunday or Monday and then started having nausea and vomiting over the weekend.  Patient states she is just very tired and does not feel well.  She does not know that she has had a fever.  States the area on her abdomen has worsened and is more painful.      Physical Exam   Triage Vital Signs: ED Triage Vitals  Enc Vitals Group     BP 08/16/21 0938 (!) 153/96     Pulse Rate 08/16/21 0938 (!) 118     Resp 08/16/21 0938 (!) 26     Temp 08/16/21 0937 99.6 F (37.6 C)     Temp Source 08/16/21 0937 Oral     SpO2 08/16/21 0938 94 %     Weight 08/16/21 0938 215 lb (97.5 kg)     Height 08/16/21 0938 5\' 1"  (1.549 m)     Head Circumference --      Peak Flow --      Pain Score 08/16/21 0938 10     Pain Loc --      Pain Edu? --      Excl. in Early? --     Most recent vital signs: Vitals:   08/16/21 1204 08/16/21 1252  BP: 102/74 (!) 121/97  Pulse: (!) 103 100  Resp: 16 16  Temp:    SpO2: 97% 98%     General: Awake, no distress.   CV:  Good peripheral perfusion.  Tachycardic, regular rhythm Resp:  Normal effort. Lungs CTA Abd:  No distention.  Large abscess noted under the pannus, approximately 12 inches x 4 inches, area is very tender to palpation Other:      ED Results / Procedures / Treatments   Labs (all labs ordered are listed, but only abnormal results are displayed) Labs Reviewed  LACTIC ACID, PLASMA - Abnormal; Notable for the following components:      Result Value   Lactic Acid, Venous 2.7 (*)    All other components within normal limits  LACTIC ACID,  PLASMA - Abnormal; Notable for the following components:   Lactic Acid, Venous 2.5 (*)    All other components within normal limits  COMPREHENSIVE METABOLIC PANEL - Abnormal; Notable for the following components:   Sodium 133 (*)    Glucose, Bld 358 (*)    Creatinine, Ser 1.03 (*)    Calcium 8.5 (*)    Albumin 3.1 (*)    Total Bilirubin 3.1 (*)    All other components within normal limits  CBC WITH DIFFERENTIAL/PLATELET - Abnormal; Notable for the following components:   WBC 15.4 (*)    Neutro Abs 12.4 (*)    Monocytes Absolute 1.7 (*)    Abs Immature Granulocytes 0.11 (*)    All other components within normal limits  URINALYSIS, COMPLETE (UACMP) WITH MICROSCOPIC - Abnormal; Notable for the following components:   Color, Urine AMBER (*)    APPearance HAZY (*)    Specific Gravity, Urine <1.005 (*)    Glucose, UA 500 (*)  Hgb urine dipstick TRACE (*)    Bilirubin Urine SMALL (*)    Ketones, ur 15 (*)    Protein, ur 30 (*)    Nitrite POSITIVE (*)    Leukocytes,Ua SMALL (*)    Bacteria, UA MANY (*)    All other components within normal limits  RESP PANEL BY RT-PCR (FLU A&B, COVID) [id number]  CULTURE, BLOOD (ROUTINE X 2)  CULTURE, BLOOD (ROUTINE X 2)  URINE CULTURE  PROTIME-INR  APTT     EKG     RADIOLOGY CT abdomen/pelvis with IV contrast    PROCEDURES:   Procedures   MEDICATIONS ORDERED IN ED: Medications  vancomycin (VANCOCIN) IVPB 1000 mg/200 mL premix (0 mg Intravenous Stopped 08/16/21 1306)    Followed by  vancomycin (VANCOCIN) IVPB 1000 mg/200 mL premix (has no administration in time range)  sodium chloride 0.9 % bolus 1,000 mL (1,000 mLs Intravenous New Bag/Given 08/16/21 1047)  cefTRIAXone (ROCEPHIN) 1 g in sodium chloride 0.9 % 100 mL IVPB (0 g Intravenous Stopped 08/16/21 1125)  cefTRIAXone (ROCEPHIN) 1 g in sodium chloride 0.9 % 100 mL IVPB (0 g Intravenous Stopped 08/16/21 1257)  iohexol (OMNIPAQUE) 300 MG/ML solution 100 mL (100 mLs Intravenous  Contrast Given 08/16/21 1113)     IMPRESSION / MDM / ASSESSMENT AND PLAN / ED COURSE  I reviewed the triage vital signs and the nursing notes.                              Differential diagnosis includes, but is not limited to, sepsis, DKA, cellulitis, abscess  Due to the patient's symptoms along with her being tachycardic and low-grade temp will start evolving sepsis protocols.  Normal saline initiated, awaiting antibiotics for sepsis protocol but will start rocephin for cellulitis  ----------------------------------------- 11:09 AM on 08/16/2021 ----------------------------------------- Labs indicate sepsis.  Sepsis was confirmed with lactic acid of 2.7, code sepsis initiated  Patient CBC had elevated WBC of 15 with elevated neutrophils, comprehensive metabolic panel shows a glucose elevation of 358, creatinine at 1.03, total bili is at 3.1, PT and PTT are normal  UA and second lactic are still pending, COVID test initiated for admission CT of the abdomen and pelvis with IV contrast to evaluate the amount of infection causing sepsis  Plan at this time is to complete the CT and consult to hospitalist  CT of the abdomen/pelvis was reviewed by me.  Does appear to have a collection to indicate abscess.  Radiologist is read as an abscess in the abdominal wall, also has concerns about gas in the bladder and urinary pole.  When I discussed all of this with the hospitalist,  he is going consult urology about the gas in the bladder and the urinary pole.  Review of her past medical records do not see any interventions by a urologist in the recent months.  Patient states she had a bladder stent placed over 10 years ago.  I am consulting surgery to see if there is any surgical intervention that needs to be performed at this time  Shon Millet PA-C to come and assess patient for surgical consult  Patient is in stable condition at time of admission,  Surgery is taking to the OR today UA  shows infection with nitrites and small amount of leuks, urine culture ordered      FINAL CLINICAL IMPRESSION(S) / ED DIAGNOSES   Final diagnoses:  Acute sepsis ([place])  Abdominal wall cellulitis  Rx / DC Orders   ED Discharge Orders     None        Note:  This document was prepared using Dragon voice recognition software and may include unintentional dictation errors.    Versie Starks, PA-C 08/16/21 1229    Versie Starks, PA-C 08/16/21 1306    Caryn Section Linden Dolin, PA-C 08/16/21 1307    Harvest Dark, MD 08/16/21 1511

## 2021-08-16 NOTE — Sepsis Progress Note (Signed)
eLink is following this Code Sepsis. °

## 2021-08-16 NOTE — Consult Note (Signed)
[facility] SURGICAL CONSULTATION NOTE (initial) - cpt: 99244   HISTORY OF PRESENT ILLNESS (HPI):  61 y.o. female presented to [facility] ED today for evaluation of abdominal wall cellulitis. Patient reports she began "feeling bad" at the end of last week, and this persisted into this week. She reports generalized fatigue and malaise. On Monday, she noticed a "boil" to her right abdominal wall/pannus. She tried warm compresses without improvement. This has continued to increase in size with associated warmth, erythema, and severe pain. No fever, chills, cough, SOB, nausea, or emesis. She does have a history of  reportedly controlled DM. No history of similar in the past. Work up in the ED revealed a leukocytosis to 15.4K, hyperglycemia to 358, and lactic acidosis to 2.7. She did undergo CT Abdomen/Pelvis which was concerning for significant inflammatory changes to the right abdominal wall/pannus concerning for cellulitis and abscess. She interesting was also found to have a small amount of air in her bladder. She denied any pneumouria, dysuria, frequency, hematuria, or stool in her bladder. No reported recent instrumentation.   Surgery is consulted by emergency medicine provider Ilean Skill, PA-C in this context for evaluation and management of abdominal wall abscess.   PAST MEDICAL HISTORY (PMH):  Past Medical History:  Diagnosis Date   Anxiety    Breast lesion    benign   Diabetes mellitus without complication (HCC)    Elevated transaminase level    ? fatty liver    History of endometrial cancer 12/15/2012   FIGO Grade 2 endometrioid adenocarcinoma with squamous differentiation involving the mucosa only.  From [facility] Guidelines:  Recommended surveillance after treatment of endometrial cancer includes a follow-up visit every 3-6 months for 2 years, then every 6 months for 3 years, and annually thereafter. Each follow-up visit should include a thorough patient history; elicitation and  investigation of any new symptoms associated with recurrence, such as vaginal bleeding, pelvic pain, weight loss, or lethargy; and a thorough speculum, pelvic, and rectovaginal examination   HTN (hypertension)    Hypothyroid    UTI (urinary tract infection)      PAST SURGICAL HISTORY ([place]):  Past Surgical History:  Procedure Laterality Date   ABDOMINAL HYSTERECTOMY     BREAST BIOPSY     benign cyst 2011   CESAREAN SECTION     LAPAROSCOPIC TOTAL HYSTERECTOMY  11/2012     MEDICATIONS:  Prior to Admission medications   Medication Sig Start Date End Date Taking? Authorizing Provider  atorvastatin (LIPITOR) 10 MG tablet Take 1 tablet (10 mg total) by mouth daily. In evening with a low fat snack 09/12/20   Tower, Wynelle Fanny, MD  busPIRone (BUSPAR) 15 MG tablet Take 0.5 tablets (7.5 mg total) by mouth 2 (two) times daily. 09/12/20   Tower, Wynelle Fanny, MD  glimepiride (AMARYL) 4 MG tablet TAKE 1 TABLET BY MOUTH DAILY BEFORE BREAKFAST. 05/20/21   Tower, Wynelle Fanny, MD  levothyroxine (SYNTHROID) 125 MCG tablet TAKE 1 TABLET BY MOUTH EVERY DAY 05/20/21   Philemon Kingdom, MD  losartan (COZAAR) 100 MG tablet Take 0.5 tablets (50 mg total) by mouth daily. 09/12/20   Tower, Wynelle Fanny, MD  metFORMIN (GLUCOPHAGE) 1000 MG tablet Take 1 tablet (1,000 mg total) by mouth 2 (two) times daily with a meal. 09/12/20   Tower, Wynelle Fanny, MD     ALLERGIES:  No Known Allergies   SOCIAL HISTORY:  Social History   Socioeconomic History   Marital status: Legally Separated    Spouse name:  Not on file   Number of children: 2   Years of education: Not on file   Highest education level: Not on file  Occupational History   Occupation: shift Programme researcher, broadcasting/film/video: [employer]  Tobacco Use   Smoking status: Former    Types: Cigarettes    Quit date: 07/22/1979    Years since quitting: 42.0   Smokeless tobacco: Never   Tobacco comments:    briefly smoked at 54  Substance and Sexual Activity   Alcohol use: No     Alcohol/week: 0.0 standard drinks    Comment: rarely   Drug use: No   Sexual activity: Yes    Birth control/protection: Post-menopausal  Other Topics Concern   Not on file  Social History Narrative   Not on file   Social Determinants of Health   Financial Resource Strain: Not on file  Food Insecurity: Not on file  Transportation Needs: Not on file  Physical Activity: Not on file  Stress: Not on file  Social Connections: Not on file  Intimate Partner Violence: Not on file     FAMILY HISTORY:  Family History  Problem Relation Age of Onset   Hypothyroidism Mother    Alzheimer's disease Mother    Stroke Father    Deep vein thrombosis Sister    Colon cancer Neg Hx    Esophageal cancer Neg Hx    Stomach cancer Neg Hx    Rectal cancer Neg Hx       REVIEW OF SYSTEMS:  Review of Systems  Constitutional:  Positive for malaise/fatigue. Negative for chills and fever.  HENT:  Negative for congestion and sore throat.   Respiratory:  Negative for cough and shortness of breath.   Cardiovascular:  Negative for chest pain and palpitations.  Gastrointestinal:  Negative for abdominal pain, constipation, diarrhea, nausea and vomiting.  Genitourinary:  Negative for dysuria, hematuria and urgency.  Musculoskeletal:  Negative for myalgias and neck pain.  Skin:        + Cellulitis (right abdominal wall)   All other systems reviewed and are negative.  VITAL SIGNS:  Temp:  [99.6 F (37.6 C)] 99.6 F (37.6 C) (01/27 0938) Pulse Rate:  [103-118] 103 (01/27 1204) Resp:  [16-26] 16 (01/27 1204) BP: (102-153)/(74-96) 102/74 (01/27 1204) SpO2:  [94 %-97 %] 97 % (01/27 1204) Weight:  [97.5 kg] 97.5 kg (01/27 0938)     Height: 5\' 1"  (154.9 cm) Weight: 97.5 kg BMI (Calculated): 40.64   INTAKE/OUTPUT:  No intake/output data recorded.  PHYSICAL EXAM:  Physical Exam Vitals and nursing note reviewed. Exam conducted with a chaperone present.  Constitutional:      General: She is not in acute  distress.    Appearance: Normal appearance. She is obese. She is not ill-appearing.  HENT:     Head: Normocephalic and atraumatic.  Eyes:     General: No scleral icterus.    Conjunctiva/sclera: Conjunctivae normal.  Cardiovascular:     Rate and Rhythm: Tachycardia present.     Pulses: Normal pulses.  Pulmonary:     Effort: Pulmonary effort is normal. No respiratory distress.  Abdominal:     General: There is no distension.     Tenderness: There is abdominal tenderness in the right lower quadrant. There is no guarding or rebound.     Comments: Again, significant erythema, warmth, and tenderness to the right lower abdominal wall/pannus concerning for cellulitis and likely underlying abscess   Genitourinary:    Comments: Deferred  Musculoskeletal:     Right lower leg: No edema.     Left lower leg: No edema.  Skin:    General: Skin is warm and dry.     Findings: Abscess and erythema present.       Neurological:     General: No focal deficit present.     Mental Status: She is alert and oriented to person, place, and time.  Psychiatric:        Mood and Affect: Mood normal.        Behavior: Behavior normal.     Labs:  CBC Latest Ref Rng & Units 08/16/2021 09/07/2020 08/19/2019  WBC 4.0 - 10.5 K/uL 15.4(H) 6.6 8.6  Hemoglobin 12.0 - 15.0 g/dL 13.2 13.3 13.3  Hematocrit 36.0 - 46.0 % 39.6 39.1 39.9  Platelets 150 - 400 K/uL 196 184.0 214.0   CMP Latest Ref Rng & Units 08/16/2021 09/07/2020 08/19/2019  Glucose 70 - 99 mg/dL 358(H) 141(H) 206(H)  BUN 8 - 23 mg/dL 16 13 21   Creatinine 0.44 - 1.00 mg/dL 1.03(H) 0.84 1.00  Sodium 135 - 145 mmol/L 133(L) 139 140  Potassium 3.5 - 5.1 mmol/L 3.6 4.1 4.1  Chloride 98 - 111 mmol/L 101 104 103  CO2 22 - 32 mmol/L 22 29 28   Calcium 8.9 - 10.3 mg/dL 8.5(L) 9.6 9.3  Total Protein 6.5 - 8.1 g/dL 7.7 6.7 7.4  Total Bilirubin 0.3 - 1.2 mg/dL 3.1(H) 0.9 0.7  Alkaline Phos 38 - 126 U/L 114 102 95  AST 15 - 41 U/L 29 31 20   ALT 0 - 44 U/L 22 32 18      Imaging studies:   CT Abdomen/Pelvis (08/16/2021) personally reviewed with significant inflammation to the right abdominal wall/pannus concerning for cellulitis with phlegmon vs abscess, small amount of air in bladder, and radiologist report reviewed below:  IMPRESSION: 1. Cutaneous skin thickening with subcutaneous edema and a 9.7 x 5.9 cm phlegmonous collection in the right anterior abdominal wall. 2. Gas in the urinary bladder and left upper pole collecting system which may reflect sequela of recent instrumentation however emphysematous infection also possible. Correlate with laboratory values suggested. 3. Hepatic steatosis with cirrhotic hepatic morphology. 4. Colonic diverticulosis without findings of acute diverticulitis. 5. Emphysema (ICD10-J43.9).   Assessment/Plan: (ICD-10's: L55.211) 61 y.o. female with leukocytosis and hyperglycemia found to have significant cellulitis and likely underlying abscess to the right abdominal wall/pannus, complicated by pertinent comorbidities including uncontrolled DM.   - Will plan on I&D in the OR this afternoon with Dr Christian Mate pending OR/Anesthesia availability - All risks, benefits, and alternatives to above procedure(s) were discussed with the patient and son at bedside, all of her questions were answered to her expressed satisfaction, patient expresses she wishes to proceed, and informed consent was obtained.    - Continue NPO + IVF Resuscitation  - Continue IV Abx (Vancomycin)   - Pain control prn - Needs SSI for glucose control; monitor - Monitor leukocytosis - No clear etiology of air in bladder on imaging; she is without symptoms   - Further management per primary service; we will of course follow   All of the above findings and recommendations were discussed with the patient and her family, and all of patient's and her family's questions were answered to their expressed satisfaction.  Thank you for the opportunity to  participate in this patient's care.   -- Edison Simon, PA-C [facility] 08/16/2021, 12:34 PM [phone] M-F: 7am - 4pm

## 2021-08-16 NOTE — Transfer of Care (Signed)
Immediate Anesthesia Transfer of Care Note  Patient: Taylor Grimes  Procedure(s) Performed: INCISION AND DRAINAGE ABSCESS  Patient Location: PACU  Anesthesia Type:General  Level of Consciousness: drowsy  Airway & Oxygen Therapy: Patient Spontanous Breathing and Patient connected to face mask  Post-op Assessment: Report given to RN, Post -op Vital signs reviewed and stable and Patient moving all extremities  Post vital signs: Reviewed and stable  Last Vitals:  Vitals Value Taken Time  BP 90/75 08/16/21 1445  Temp    Pulse 98 08/16/21 1451  Resp 12 08/16/21 1451  SpO2 100 % 08/16/21 1451  Vitals shown include unvalidated device data.  Last Pain:  Vitals:   08/16/21 1330  TempSrc: Temporal  PainSc: 5          Complications: No notable events documented.

## 2021-08-16 NOTE — ED Triage Notes (Signed)
Pt c/o red swollen area to the right lower abd for the past 3 days

## 2021-08-16 NOTE — Progress Notes (Signed)
CODE SEPSIS - PHARMACY COMMUNICATION  **Broad Spectrum Antibiotics should be administered within 1 hour of Sepsis diagnosis**  Time Code Sepsis Called/Page Received: 1109  Antibiotics Ordered: ceftriaxone and vancomycin  Time of 1st antibiotic administration: [place] ,PharmD Clinical Pharmacist  08/16/2021  11:13 AM

## 2021-08-16 NOTE — H&P (Signed)
Patient came from home, he is independent of all ADLs at baseline. Chief Complaint: Abdominal wall redness and swelling. HPI: Taylor Grimes is a 61 year old female with history of morbid obesity, type 2 diabetes, hypertension, hypothyroidism, who presents to the hospital with 1 week history of sleepiness and nausea, 2 days of lower abdomen redness. Patient is a previously healthy, she started have nausea since last Friday, but no vomiting or diarrhea.  No fever or chills.  On Saturday, patient become very sleepy.  She has been sleeping all day long for the last few days, she has not been eating or drinking much. For the last 2 days, husband noticed that patient lower abdomen has been red and the swelling. In the emergency room, her temperature was 99.6, heart rate 118, respiratory rate 26,Leukocytosis of 15.4, lactic acid level 2.7. CT showed cutaneous skin thickening with subcutaneous edema and a 9.7 x 5.9 cm phlegmonous collection in the right anterior abdominal wall. Gas in the urinary bladder and left upper pole collecting system. Patient was given vancomycin and Rocephin.  General surgery consult was obtained, patient will be taken to the OR today for I&D.  Past Medical History:  Diagnosis Date   Anxiety    Breast lesion    benign   Diabetes mellitus without complication (HCC)    Elevated transaminase level    ? fatty liver    History of endometrial cancer 12/15/2012   FIGO Grade 2 endometrioid adenocarcinoma with squamous differentiation involving the mucosa only.  From SGO Guidelines:  Recommended surveillance after treatment of endometrial cancer includes a follow-up visit every 3-6 months for 2 years, then every 6 months for 3 years, and annually thereafter. Each follow-up visit should include a thorough patient history; elicitation and investigation of any new symptoms associated with recurrence, such as vaginal bleeding, pelvic pain, weight loss, or lethargy; and a thorough speculum,  pelvic, and rectovaginal examination   HTN (hypertension)    Hypothyroid    UTI (urinary tract infection)     Past Surgical History:  Procedure Laterality Date   ABDOMINAL HYSTERECTOMY     BREAST BIOPSY     benign cyst 2011   CESAREAN SECTION     LAPAROSCOPIC TOTAL HYSTERECTOMY  11/2012    Family History  Problem Relation Age of Onset   Hypothyroidism Mother    Alzheimer's disease Mother    Stroke Father    Deep vein thrombosis Sister    Colon cancer Neg Hx    Esophageal cancer Neg Hx    Stomach cancer Neg Hx    Rectal cancer Neg Hx    Social History:  reports that she quit smoking about 42 years ago. Her smoking use included cigarettes. She has never used smokeless tobacco. She reports that she does not drink alcohol and does not use drugs.  Allergies: No Known Allergies  Medications Prior to Admission  Medication Sig Dispense Refill   atorvastatin (LIPITOR) 10 MG tablet Take 1 tablet (10 mg total) by mouth daily. In evening with a low fat snack 90 tablet 3   busPIRone (BUSPAR) 15 MG tablet Take 0.5 tablets (7.5 mg total) by mouth 2 (two) times daily. 90 tablet 3   glimepiride (AMARYL) 4 MG tablet TAKE 1 TABLET BY MOUTH DAILY BEFORE BREAKFAST. 90 tablet 1   levothyroxine (SYNTHROID) 125 MCG tablet TAKE 1 TABLET BY MOUTH EVERY DAY 90 tablet 1   losartan (COZAAR) 100 MG tablet Take 0.5 tablets (50 mg total) by mouth daily. 45 tablet  3   metFORMIN (GLUCOPHAGE) 1000 MG tablet Take 1 tablet (1,000 mg total) by mouth 2 (two) times daily with a meal. 180 tablet 3    Results for orders placed or performed during the hospital encounter of 08/16/21 (from the past 48 hour(s))  Lactic acid, plasma     Status: Abnormal   Collection Time: 08/16/21 10:09 AM  Result Value Ref Range   Lactic Acid, Venous 2.7 (HH) 0.5 - 1.9 mmol/L    Comment: CRITICAL RESULT CALLED TO, READ BACK BY AND VERIFIED WITH LINDA LLAMB 08/16/21 1053 MW Performed at [facility], [address]  [place], [place] [address]   Comprehensive metabolic panel     Status: Abnormal   Collection Time: 08/16/21 10:09 AM  Result Value Ref Range   Sodium 133 (L) 135 - 145 mmol/L   Potassium 3.6 3.5 - 5.1 mmol/L   Chloride 101 98 - 111 mmol/L   CO2 22 22 - 32 mmol/L   Glucose, Bld 358 (H) 70 - 99 mg/dL    Comment: Glucose reference range applies only to samples taken after fasting for at least 8 hours.   BUN 16 8 - 23 mg/dL   Creatinine, Ser 1.03 (H) 0.44 - 1.00 mg/dL   Calcium 8.5 (L) 8.9 - 10.3 mg/dL   Total Protein 7.7 6.5 - 8.1 g/dL   Albumin 3.1 (L) 3.5 - 5.0 g/dL   AST 29 15 - 41 U/L   ALT 22 0 - 44 U/L   Alkaline Phosphatase 114 38 - 126 U/L   Total Bilirubin 3.1 (H) 0.3 - 1.2 mg/dL   GFR, Estimated >60 >60 mL/min    Comment: (NOTE) Calculated using the CKD-EPI Creatinine Equation (2021)    Anion gap 10 5 - 15    Comment: Performed at [facility], [address] [place], [place] [address]  CBC WITH DIFFERENTIAL     Status: Abnormal   Collection Time: 08/16/21 10:09 AM  Result Value Ref Range   WBC 15.4 (H) 4.0 - 10.5 K/uL   RBC 4.33 3.87 - 5.11 MIL/uL   Hemoglobin 13.2 12.0 - 15.0 g/dL   HCT 39.6 36.0 - 46.0 %   MCV 91.5 80.0 - 100.0 fL   MCH 30.5 26.0 - 34.0 pg   MCHC 33.3 30.0 - 36.0 g/dL   RDW 12.5 11.5 - 15.5 %   Platelets 196 150 - 400 K/uL   nRBC 0.0 0.0 - 0.2 %   Neutrophils Relative % 81 %   Neutro Abs 12.4 (H) 1.7 - 7.7 K/uL   Lymphocytes Relative 7 %   Lymphs Abs 1.1 0.7 - 4.0 K/uL   Monocytes Relative 11 %   Monocytes Absolute 1.7 (H) 0.1 - 1.0 K/uL   Eosinophils Relative 0 %   Eosinophils Absolute 0.0 0.0 - 0.5 K/uL   Basophils Relative 0 %   Basophils Absolute 0.1 0.0 - 0.1 K/uL   Immature Granulocytes 1 %   Abs Immature Granulocytes 0.11 (H) 0.00 - 0.07 K/uL    Comment: Performed at [facility], [address] [place], [place] [address]  Protime-INR     Status: None   Collection Time: 08/16/21 10:09 AM  Result Value Ref Range    Prothrombin Time 13.9 11.4 - 15.2 seconds   INR 1.1 0.8 - 1.2    Comment: (NOTE) INR goal varies based on device and disease states. Performed at [facility], [address], [place], [place] [address]   APTT  Status: None   Collection Time: 08/16/21 10:09 AM  Result Value Ref Range   aPTT 27 24 - 36 seconds    Comment: Performed at [facility], [address] [place], [place] [address]  Urinalysis, Complete w Microscopic Urine, Clean Catch     Status: Abnormal   Collection Time: 08/16/21 10:09 AM  Result Value Ref Range   Color, Urine AMBER (A) YELLOW    Comment: BIOCHEMICALS MAY BE AFFECTED BY COLOR   APPearance HAZY (A) CLEAR   Specific Gravity, Urine <1.005 (L) 1.005 - 1.030   pH 6.0 5.0 - 8.0   Glucose, UA 500 (A) NEGATIVE mg/dL   Hgb urine dipstick TRACE (A) NEGATIVE   Bilirubin Urine SMALL (A) NEGATIVE   Ketones, ur 15 (A) NEGATIVE mg/dL   Protein, ur 30 (A) NEGATIVE mg/dL   Nitrite POSITIVE (A) NEGATIVE   Leukocytes,Ua SMALL (A) NEGATIVE   Squamous Epithelial / LPF 6-10 0 - 5   WBC, UA >50 0 - 5 WBC/hpf   RBC / HPF 6-10 0 - 5 RBC/hpf   Bacteria, UA MANY (A) NONE SEEN    Comment: Performed at [facility], [address] [place], [place] [address]  Resp Panel by RT-PCR (Flu A&B, Covid) Nasopharyngeal Swab     Status: None   Collection Time: 08/16/21 10:46 AM   Specimen: Nasopharyngeal Swab; Nasopharyngeal(NP) swabs in vial transport medium  Result Value Ref Range   SARS Coronavirus 2 by RT PCR NEGATIVE NEGATIVE    Comment: (NOTE) SARS-CoV-2 target nucleic acids are NOT DETECTED.  The SARS-CoV-2 RNA is generally detectable in upper respiratory specimens during the acute phase of infection. The lowest concentration of SARS-CoV-2 viral copies this assay can detect is 138 copies/mL. A negative result does not preclude SARS-Cov-2 infection and should not be used as the sole basis for treatment or other patient management decisions. A  negative result may occur with  improper specimen collection/handling, submission of specimen other than nasopharyngeal swab, presence of viral mutation(s) within the areas targeted by this assay, and inadequate number of viral copies(<138 copies/mL). A negative result must be combined with clinical observations, patient history, and epidemiological information. The expected result is Negative.  Fact Sheet for Patients:  EntrepreneurPulse.com.au  Fact Sheet for Healthcare Providers:  IncredibleEmployment.be  This test is no t yet approved or cleared by the [place] FDA and  has been authorized for detection and/or diagnosis of SARS-CoV-2 by FDA under an Emergency Use Authorization (EUA). This EUA will remain  in effect (meaning this test can be used) for the duration of the COVID-19 declaration under Section 564(b)(1) of the Act, 21 U.S.C.section 360bbb-3(b)(1), unless the authorization is terminated  or revoked sooner.       Influenza A by PCR NEGATIVE NEGATIVE   Influenza B by PCR NEGATIVE NEGATIVE    Comment: (NOTE) The Xpert Xpress SARS-CoV-2/FLU/RSV plus assay is intended as an aid in the diagnosis of influenza from Nasopharyngeal swab specimens and should not be used as a sole basis for treatment. Nasal washings and aspirates are unacceptable for Xpert Xpress SARS-CoV-2/FLU/RSV testing.  Fact Sheet for Patients: EntrepreneurPulse.com.au  Fact Sheet for Healthcare Providers: IncredibleEmployment.be  This test is not yet approved or cleared by the [place] FDA and has been authorized for detection and/or diagnosis of SARS-CoV-2 by FDA under an Emergency Use Authorization (EUA). This EUA will remain in effect (meaning this test can be used) for the duration of the COVID-19 declaration under Section 564(b)(1)  of the Act, 21 U.S.C. section 360bbb-3(b)(1), unless the authorization is terminated  or revoked.  Performed at [facility], [place], [place] [address]   Lactic acid, plasma     Status: Abnormal   Collection Time: 08/16/21 12:00 PM  Result Value Ref Range   Lactic Acid, Venous 2.5 (HH) 0.5 - 1.9 mmol/L    Comment: CRITICAL VALUE NOTED. VALUE IS CONSISTENT WITH PREVIOUSLY REPORTED/CALLED VALUE MW Performed at [facility], [address] [place], [place] [address]    CT ABDOMEN PELVIS W CONTRAST  Result Date: 08/16/2021 CLINICAL DATA:  Sepsis EXAM: CT ABDOMEN AND PELVIS WITH CONTRAST TECHNIQUE: Multidetector CT imaging of the abdomen and pelvis was performed using the standard protocol following bolus administration of intravenous contrast. RADIATION DOSE REDUCTION: This exam was performed according to the departmental dose-optimization program which includes automated exposure control, adjustment of the mA and/or kV according to patient size and/or use of iterative reconstruction technique. CONTRAST:  135m OMNIPAQUE IOHEXOL 300 MG/ML  SOLN COMPARISON:  None. FINDINGS: Lower chest: No acute abnormality. Hepatobiliary: Hepatic steatosis with cirrhotic hepatic morphology. Gallbladder is unremarkable. No biliary ductal dilation. Pancreas: No pancreatic ductal dilation or evidence of acute inflammation. Spleen: No splenomegaly. Adrenals/Urinary Tract: Bilateral adrenal glands appear normal. There is gas in the urinary bladder and left upper pole collecting system which may reflect sequela of recent instrumentation however emphysematous infection also possible. No hydronephrosis. There is symmetric enhancement and excretion of contrast from the bilateral kidneys. Stomach/Bowel: No enteric contrast was administered. Stomach is unremarkable for degree of distension. No pathologic dilation of small or large bowel. The appendix and terminal ileum appear normal. Colonic diverticulosis without findings of acute diverticulitis. Vascular/Lymphatic: No  abdominal aortic aneurysm. Prominent periportal and retroperitoneal lymph nodes measuring up to 8 mm in short axis on image 47/2 are favored reactive. No pathologically enlarged abdominal or pelvic lymph nodes. Reproductive: Status post hysterectomy. No adnexal masses. Other: Cutaneous skin thickening with subcutaneous edema and a 9.7 x 5.9 cm phlegmonous collection in the right anterior abdominal wall. Fat containing ventral hernia. Musculoskeletal: Multilevel degenerative changes spine. No acute osseous abnormality. IMPRESSION: 1. Cutaneous skin thickening with subcutaneous edema and a 9.7 x 5.9 cm phlegmonous collection in the right anterior abdominal wall. 2. Gas in the urinary bladder and left upper pole collecting system which may reflect sequela of recent instrumentation however emphysematous infection also possible. Correlate with laboratory values suggested. 3. Hepatic steatosis with cirrhotic hepatic morphology. 4. Colonic diverticulosis without findings of acute diverticulitis. 5. Emphysema (ICD10-J43.9). Electronically Signed   By: JDahlia BailiffM.D.   On: 08/16/2021 11:51    Review of Systems  Constitutional:  Positive for fatigue. Negative for chills, fever and unexpected weight change.  HENT:  Negative for postnasal drip and rhinorrhea.   Eyes:  Negative for pain and discharge.  Respiratory:  Negative for cough, choking and shortness of breath.   Cardiovascular:  Negative for chest pain and leg swelling.  Gastrointestinal:  Positive for abdominal pain, nausea and vomiting. Negative for constipation and diarrhea.  Genitourinary:  Negative for dysuria, hematuria and urgency.  Musculoskeletal:  Negative for back pain and myalgias.  Neurological:  Negative for dizziness, syncope, light-headedness and headaches.  Psychiatric/Behavioral:  Positive for sleep disturbance. Negative for confusion.    Blood pressure (!) 121/97, pulse 100, temperature 99.6 F (37.6 C), temperature source Oral,  resp. rate 16, height 5' 1" (1.549 m), weight 97.5 kg, last menstrual period 07/21/2008, SpO2 98 %.  Physical Exam Constitutional:      General: She is not in acute distress.    Appearance: She is obese. She is not ill-appearing.  HENT:     Head: Normocephalic and atraumatic.     Nose: Nose normal. No congestion.     Mouth/Throat:     Mouth: Mucous membranes are moist.  Eyes:     Extraocular Movements: Extraocular movements intact.     Conjunctiva/sclera: Conjunctivae normal.     Pupils: Pupils are equal, round, and reactive to light.  Cardiovascular:     Rate and Rhythm: Normal rate and regular rhythm.     Heart sounds: No murmur heard.   No friction rub. No gallop.  Pulmonary:     Effort: Pulmonary effort is normal. No respiratory distress.     Breath sounds: Normal breath sounds. No wheezing or rales.  Abdominal:     General: Abdomen is flat. Bowel sounds are normal. There is no distension.     Palpations: Abdomen is soft.     Tenderness: There is no abdominal tenderness.  Musculoskeletal:        General: No swelling or tenderness. Normal range of motion.     Cervical back: Normal range of motion and neck supple. No rigidity.  Lymphadenopathy:     Cervical: No cervical adenopathy.  Skin:    General: Skin is warm and dry.     Coloration: Skin is not jaundiced.     Comments: Lower abdomenal wall redness, tender to touch with some swelling.  Neurological:     General: No focal deficit present.     Mental Status: She is alert and oriented to person, place, and time.     Cranial Nerves: No cranial nerve deficit.  Psychiatric:        Mood and Affect: Mood normal.        Thought Content: Thought content normal.     Assessment/Plan Sepsis secondary to cellulitis Abdominal wall cellulitis with abscess. Patient met sepsis criteria at the time of admission with tachycardia, tachypnea, lactic acid elevation, leukocytosis.  This is secondary to abdominal wall cellulitis and  abscess. CT has confirmed fluid collection.  We will continue antibiotics with Rocephin and vancomycin. Cellulitis also seem to involve skin fold, likely involving fungal infection with secondary bacterial infection. Will also start Diflucan 200 mg daily.  Erythematous pyelonephritis. Patient has abnormal urine, CT scan showed gas in the bladder and upper pole of his kidney.  Cannot rule out erythematous pyelonephritis.  Nephrology consult will be obtained. Rocephin should cover.  Uncontrolled type 2 diabetes with hyperglycemia We will hold off oral diabetic medicines, will start sliding scale insulin.  Patient may need insulin tomorrow after starting a diet. Check hemoglobin A1c.  Morbid obesity.  Essential hypertension Continues on home medicines  Prophylaxis  Start Lovenox and SCDs         Sharen Hones, MD 08/16/2021, 1:25 PM

## 2021-08-16 NOTE — ED Notes (Addendum)
See triage note   presents with possible abscess area to lower abd the patient has large red hard area to lower abd states she was sick with n/v last weekend   then developed abscess  area is red and swollen  low grade temp on arrival

## 2021-08-16 NOTE — Progress Notes (Signed)
Cross Cover Full code status entered in Epic EMR per Dr [place] Locks

## 2021-08-16 NOTE — Op Note (Addendum)
Incision and drainage of necrotizing abscess right lower quadrant abdominal wall, excisional debridement of skin and subcutaneous tissues.  Application of negative pressure wound therapy, (wound VAC).  Pre-operative Diagnosis: Necrotizing abscess right lower quadrant abdominal wall/pannus.  Post-operative Diagnosis: same.   Surgeon: Ronny Bacon, M.D., FACS  Anesthesia: General LMA  Findings: Black malodorous drainage, with liquefactive necrosis present.  Fairly well-defined tissue limits.  Some anterior lobular lysis of adipose planes.  Upon completion of debridement: Size the wound measures 15 cm from medial to lateral, 3 cm from cephalad to caudal, with 4 cm of depth.  There is no tunneling or undermining.  Estimated Blood Loss: 20 mL         Specimens: Abscess fluid for culture and sensitivity.          Complications: none              Procedure Details  The patient was seen again in the Holding Room. The benefits, complications, treatment options, and expected outcomes were discussed with the patient. The risks of bleeding, infection, recurrence of symptoms, failure to resolve symptoms, unanticipated injury, prosthetic placement, prosthetic infection, any of which could require further surgery were reviewed with the patient. The likelihood of improving the patient's symptoms with return to their baseline status is expected.  The patient and/or family concurred with the proposed plan, giving informed consent.  The patient was taken to Operating Room, identified and the procedure verified.    Prior to the induction of general anesthesia, antibiotic prophylaxis was administered. VTE prophylaxis was in place.  General LMA was then administered and tolerated well. After the induction, the patient was positioned in the supine position and the right lower quadrant abdomen was prepped with Chloraprep and draped in the sterile fashion.  A Time Out was held and the above information  confirmed.  Incision was made at the thinnest portion of the skin where drainage was beginning.  Immediate egress of black malodorous fluid was obtained.  This was a large volume that was under pressure.  With the suction and the cavity, we extended the incision laterally and medially.  We then proceeded with debridement of the necrotic soft tissues.  I widened the incision by elliptically excising the compromised skin both cephalad and caudad making a wider mouth to the wound.  We then explored the areas of discoloration, mobilizing a few lobules of adipose tissue that were harboring infection along the interlobular planes.  We then further explored the fascial planes and found no evidence of necrotizing fasciitis.  We then obtained adequate hemostasis with electrocautery.  Irrigated the wound on multiple occasions and confirmed hemostasis.  I then elected to apply negative pressure wound therapy, obtained a KCI wound VAC sponge, cut it to size and placed it within the soft tissues.  We then applied to cover, attached to And obtained a leak free seal. She tolerated the procedure well.  We will continue to monitor over the weekend while on antibiotics.  Anticipating wound VAC change on Monday.      Ronny Bacon M.D., [facility] 08/16/2021 2:44 PM

## 2021-08-16 NOTE — Progress Notes (Signed)
PHARMACIST - PHYSICIAN COMMUNICATION  CONCERNING:  Enoxaparin (Lovenox) for DVT Prophylaxis   DESCRIPTION: Patient was prescribed enoxaprin 40mg  q24 hours for VTE prophylaxis.   Filed Weights   08/16/21 0938  Weight: 97.5 kg (215 lb)    Body mass index is 40.62 kg/m.  Estimated Creatinine Clearance: 60.5 mL/min (A) (by C-G formula based on SCr of 1.03 mg/dL (H)).   Based on [place] patient is candidate for enoxaparin 0.5mg /kg TBW SQ every 24 hours based on BMI being >30.   RECOMMENDATION: Pharmacy has adjusted enoxaparin dose per [facility] policy.  Patient is now receiving enoxaparin 50 mg every 24 hours    Darnelle Bos, PharmD Clinical Pharmacist  08/16/2021 1:40 PM

## 2021-08-16 NOTE — Progress Notes (Signed)
PHARMACY -  BRIEF ANTIBIOTIC NOTE   Pharmacy has received consult(s) for vancomycin from an ED provider.  The patient's profile has been reviewed for ht/wt/allergies/indication/available labs.    One time order(s) placed for vancomycin 2 g IV  Further antibiotics/pharmacy consults should be ordered by admitting physician if indicated.                       Thank you, Taylor Grimes 08/16/2021  11:15 AM

## 2021-08-17 ENCOUNTER — Encounter: Payer: Self-pay | Admitting: Surgery

## 2021-08-17 LAB — URINE CULTURE: Culture: <10000 — AB

## 2021-08-17 LAB — GLUCOSE, CAPILLARY
Glucose-Capillary: 82 mg/dL (ref 70–99)
Glucose-Capillary: 122 mg/dL — ABNORMAL HIGH (ref 70–99)
Glucose-Capillary: 105 mg/dL — ABNORMAL HIGH (ref 70–99)

## 2021-08-17 LAB — CREATININE, SERUM
Creatinine, Ser: 0.81 mg/dL (ref 0.44–1.00)
GFR, Estimated: >60 mL/min (ref >60)

## 2021-08-17 MED ORDER — ENSURE MAX PROTEIN PO LIQD
11.0000 [oz_av] | Freq: Two times a day (BID) | ORAL | Status: DC
  Administered 2021-08-17 – 2021-08-18 (×2): 11 [oz_av] via ORAL
  Filled 2021-08-17: qty 330

## 2021-08-17 MED ORDER — INSULIN GLARGINE-YFGN 100 UNIT/ML ~~LOC~~ SOLN
4.0000 [IU] | Freq: Every day | SUBCUTANEOUS | Status: DC
  Administered 2021-08-17: 4 [IU] via SUBCUTANEOUS
  Filled 2021-08-17 (×2): qty 0.04

## 2021-08-17 MED ORDER — OCUVITE-LUTEIN PO CAPS
1.0000 | ORAL_CAPSULE | Freq: Every day | ORAL | Status: DC
  Administered 2021-08-18 – 2021-08-19 (×2): 1 via ORAL
  Filled 2021-08-17 (×2): qty 1

## 2021-08-17 MED ORDER — INSULIN GLARGINE-YFGN 100 UNIT/ML ~~LOC~~ SOLN
8.0000 [IU] | Freq: Every day | SUBCUTANEOUS | Status: DC
  Filled 2021-08-17: qty 0.08

## 2021-08-17 MED ORDER — ASCORBIC ACID 500 MG PO TABS
250.0000 mg | ORAL_TABLET | Freq: Two times a day (BID) | ORAL | Status: DC
  Administered 2021-08-17 – 2021-08-19 (×4): 250 mg via ORAL
  Filled 2021-08-17 (×4): qty 1

## 2021-08-17 MED ORDER — SODIUM CHLORIDE 0.9 % IV SOLN
3.0000 g | Freq: Four times a day (QID) | INTRAVENOUS | Status: DC
  Administered 2021-08-17 – 2021-08-19 (×8): 3 g via INTRAVENOUS
  Filled 2021-08-17: qty 8
  Filled 2021-08-17 (×2): qty 3
  Filled 2021-08-17 (×3): qty 8
  Filled 2021-08-17: qty 3
  Filled 2021-08-17: qty 8
  Filled 2021-08-17: qty 3
  Filled 2021-08-17 (×2): qty 8

## 2021-08-17 MED ORDER — VANCOMYCIN HCL 1250 MG/250ML IV SOLN
1250.0000 mg | INTRAVENOUS | Status: DC
  Administered 2021-08-17 – 2021-08-18 (×2): 1250 mg via INTRAVENOUS
  Filled 2021-08-17 (×3): qty 250

## 2021-08-17 NOTE — Progress Notes (Signed)
PROGRESS NOTE    Taylor Grimes  NWG:[id number] DOB: Apr 19, 1961 DOA: 08/16/2021 PCP: Abner Greenspan, MD    Brief Narrative:   Taylor Grimes is a 61 year old female with history of morbid obesity, type 2 diabetes, hypertension, hypothyroidism, who presents to the hospital with 1 week history of sleepiness and nausea, 2 days of lower abdomen redness. She was diagnosed with sepsis and right lower abdomen abscess with cellulitis.  Antibiotic was started, I&D was performed by general surgery on 1/27  Assessment & Plan:   Principal Problem:   Abscess of abdominal wall Active Problems:   Hypothyroidism (acquired)   Essential hypertension   Obesity, Class III, BMI 40-49.9 (morbid obesity) ([place])   Sepsis ([place])   Uncontrolled type 2 diabetes mellitus with hyperglycemia (H[facility])  Sepsis secondary to cellulitis Abdominal wall cellulitis with abscess. Patient is status post I&D, wound culture already showed gram-positive cocci, gram-negative rods and gram-positive rods.  Final culture results still pending. Continue vancomycin, also added Unasyn to cover gram-negative as well as anaerobic's. Flucaine for 5 days   Erythematous pyelonephritis. Patient has abnormal urine, CT scan showed gas in the bladder and upper pole of his kidney.  Cannot rule out erythematous pyelonephritis.  Discussed with urology, no need for surgery.  Continue antibiotic treatment.  Uncontrolled type 2 diabetes with hyperglycemia Continue sliding scale insulin, added low dose insulin glargine.  Morbid obesity.  Essential hypertension Continues on home medicines    DVT prophylaxis: Lovenox Code Status: full Family Communication:  Disposition Plan:    Status is: Inpatient  Remains inpatient appropriate because: Severity of disease, IV antibiotics.   I/O last 3 completed shifts: In: 2325 [P.O.:125; I.V.:2000; IV Piggyback:200] Out: 20 [Blood:20] No intake/output data recorded.     Consultants:  General  surgery, urology.  Procedures: I&D.  Antimicrobials: Vancomycin and Unasyn.  Subjective: Patient doing better, no significant pain. Denies any abdominal pain or nausea vomiting. No dysuria or hematuria. No fever or chills.  Objective: Vitals:   08/16/21 1612 08/16/21 2114 08/17/21 0515 08/17/21 0843  BP: 98/64 (!) 95/54 117/75 102/62  Pulse: 78 76 83 73  Resp: 20 20 18 15   Temp: 98.6 F (37 C)  98.2 F (36.8 C) 97.8 F (36.6 C)  TempSrc: Oral     SpO2: 98% 99% 100% 99%  Weight:      Height:        Intake/Output Summary (Last 24 hours) at 08/17/2021 1323 Last data filed at 08/16/2021 1923 Gross per 24 hour  Intake 2125 ml  Output 20 ml  Net 2105 ml   Filed Weights   08/16/21 0938  Weight: 97.5 kg    Examination:  General exam: Appears calm and comfortable, morbid obese. Respiratory system: Clear to auscultation. Respiratory effort normal. Cardiovascular system: S1 & S2 heard, RRR. No JVD, murmurs, rubs, gallops or clicks. No pedal edema. Gastrointestinal system: Abdomen is nondistended, soft and nontender. No organomegaly or masses felt. Normal bowel sounds heard. Central nervous system: Alert and oriented. No focal neurological deficits. Extremities: Symmetric 5 x 5 power. Skin: No rashes, lesions or ulcers Psychiatry: Judgement and insight appear normal. Mood & affect appropriate.     Data Reviewed: I have personally reviewed following labs and imaging studies  CBC: Recent Labs  Lab 08/16/21 1009  WBC 15.4*  NEUTROABS 12.4*  HGB 13.2  HCT 39.6  MCV 91.5  PLT 578   Basic Metabolic Panel: Recent Labs  Lab 08/16/21 1009 08/17/21 0658  NA 133*  --  K 3.6  --   CL 101  --   CO2 22  --   GLUCOSE 358*  --   BUN 16  --   CREATININE 1.03* 0.81  CALCIUM 8.5*  --    GFR: Estimated Creatinine Clearance: 77 mL/min (by C-G formula based on SCr of 0.81 mg/dL). Liver Function Tests: Recent Labs  Lab 08/16/21 1009  AST 29  ALT 22  ALKPHOS 114   BILITOT 3.1*  PROT 7.7  ALBUMIN 3.1*   No results for input(s): LIPASE, AMYLASE in the last 168 hours. No results for input(s): AMMONIA in the last 168 hours. Coagulation Profile: Recent Labs  Lab 08/16/21 1009  INR 1.1   Cardiac Enzymes: No results for input(s): CKTOTAL, CKMB, CKMBINDEX, TROPONINI in the last 168 hours. BNP (last 3 results) No results for input(s): PROBNP in the last 8760 hours. HbA1C: No results for input(s): HGBA1C in the last 72 hours. CBG: Recent Labs  Lab 08/16/21 1447 08/16/21 1650 08/16/21 2258 08/17/21 0845 08/17/21 1146  GLUCAP 269* 230* 225* 82 122*   Lipid Profile: No results for input(s): CHOL, HDL, LDLCALC, TRIG, CHOLHDL, LDLDIRECT in the last 72 hours. Thyroid Function Tests: No results for input(s): TSH, T4TOTAL, FREET4, T3FREE, THYROIDAB in the last 72 hours. Anemia Panel: No results for input(s): VITAMINB12, FOLATE, FERRITIN, TIBC, IRON, RETICCTPCT in the last 72 hours. Sepsis Labs: Recent Labs  Lab 08/16/21 1009 08/16/21 1200 08/16/21 1844 08/16/21 2021  LATICACIDVEN 2.7* 2.5* 1.6 1.5    Recent Results (from the past 240 hour(s))  Blood Culture (routine x 2)     Status: None (Preliminary result)   Collection Time: 08/16/21 10:09 AM   Specimen: BLOOD  Result Value Ref Range Status   Specimen Description BLOOD RIGHT ANTECUBITAL  Final   Special Requests   Final    BOTTLES DRAWN AEROBIC AND ANAEROBIC Blood Culture results may not be optimal due to an inadequate volume of blood received in culture bottles   Culture   Final    NO GROWTH < 24 HOURS Performed at [facility], [address], [place], [place] [address]    Report Status PENDING  Incomplete  Blood Culture (routine x 2)     Status: None (Preliminary result)   Collection Time: 08/16/21 10:10 AM   Specimen: BLOOD  Result Value Ref Range Status   Specimen Description BLOOD LEFT ANTECUBITAL  Final   Special Requests   Final    BOTTLES DRAWN AEROBIC AND  ANAEROBIC Blood Culture adequate volume   Culture   Final    NO GROWTH < 24 HOURS Performed at [facility], [address] [place],  [address]    Report Status PENDING  Incomplete  Resp Panel by RT-PCR (Flu A&B, Covid) Nasopharyngeal Swab     Status: None   Collection Time: 08/16/21 10:46 AM   Specimen: Nasopharyngeal Swab; Nasopharyngeal(NP) swabs in vial transport medium  Result Value Ref Range Status   SARS Coronavirus 2 by RT PCR NEGATIVE NEGATIVE Final    Comment: (NOTE) SARS-CoV-2 target nucleic acids are NOT DETECTED.  The SARS-CoV-2 RNA is generally detectable in upper respiratory specimens during the acute phase of infection. The lowest concentration of SARS-CoV-2 viral copies this assay can detect is 138 copies/mL. A negative result does not preclude SARS-Cov-2 infection and should not be used as the sole basis for treatment or other patient management decisions. A negative result may occur with  improper specimen collection/handling, submission of specimen other than nasopharyngeal  swab, presence of viral mutation(s) within the areas targeted by this assay, and inadequate number of viral copies(<138 copies/mL). A negative result must be combined with clinical observations, patient history, and epidemiological information. The expected result is Negative.  Fact Sheet for Patients:  EntrepreneurPulse.com.au  Fact Sheet for Healthcare Providers:  IncredibleEmployment.be  This test is no t yet approved or cleared by the [place] FDA and  has been authorized for detection and/or diagnosis of SARS-CoV-2 by FDA under an Emergency Use Authorization (EUA). This EUA will remain  in effect (meaning this test can be used) for the duration of the COVID-19 declaration under Section 564(b)(1) of the Act, 21 U.S.C.section 360bbb-3(b)(1), unless the authorization is terminated  or revoked sooner.       Influenza A by PCR  NEGATIVE NEGATIVE Final   Influenza B by PCR NEGATIVE NEGATIVE Final    Comment: (NOTE) The Xpert Xpress SARS-CoV-2/FLU/RSV plus assay is intended as an aid in the diagnosis of influenza from Nasopharyngeal swab specimens and should not be used as a sole basis for treatment. Nasal washings and aspirates are unacceptable for Xpert Xpress SARS-CoV-2/FLU/RSV testing.  Fact Sheet for Patients: EntrepreneurPulse.com.au  Fact Sheet for Healthcare Providers: IncredibleEmployment.be  This test is not yet approved or cleared by the [place] FDA and has been authorized for detection and/or diagnosis of SARS-CoV-2 by FDA under an Emergency Use Authorization (EUA). This EUA will remain in effect (meaning this test can be used) for the duration of the COVID-19 declaration under Section 564(b)(1) of the Act, 21 U.S.C. section 360bbb-3(b)(1), unless the authorization is terminated or revoked.  Performed at [facility], [place], [place] [address]   Aerobic/Anaerobic Culture w Gram Stain (surgical/deep wound)     Status: None (Preliminary result)   Collection Time: 08/16/21  2:14 PM   Specimen: PATH Other; Tissue  Result Value Ref Range Status   Specimen Description ABSCESS  Final   Special Requests ABDOMINAL WALL  Final   Gram Stain   Final    MODERATE WBC PRESENT,BOTH PMN AND MONONUCLEAR ABUNDANT GRAM NEGATIVE RODS MODERATE GRAM POSITIVE COCCI FEW GRAM POSITIVE RODS    Culture   Final    NO GROWTH < 24 HOURS Performed at [facility], [place] [address], [place], [place] [address]    Report Status PENDING  Incomplete         Radiology Studies: CT ABDOMEN PELVIS W CONTRAST  Result Date: 08/16/2021 CLINICAL DATA:  Sepsis EXAM: CT ABDOMEN AND PELVIS WITH CONTRAST TECHNIQUE: Multidetector CT imaging of the abdomen and pelvis was performed using the standard protocol following bolus administration of intravenous  contrast. RADIATION DOSE REDUCTION: This exam was performed according to the departmental dose-optimization program which includes automated exposure control, adjustment of the mA and/or kV according to patient size and/or use of iterative reconstruction technique. CONTRAST:  1109mL OMNIPAQUE IOHEXOL 300 MG/ML  SOLN COMPARISON:  None. FINDINGS: Lower chest: No acute abnormality. Hepatobiliary: Hepatic steatosis with cirrhotic hepatic morphology. Gallbladder is unremarkable. No biliary ductal dilation. Pancreas: No pancreatic ductal dilation or evidence of acute inflammation. Spleen: No splenomegaly. Adrenals/Urinary Tract: Bilateral adrenal glands appear normal. There is gas in the urinary bladder and left upper pole collecting system which may reflect sequela of recent instrumentation however emphysematous infection also possible. No hydronephrosis. There is symmetric enhancement and excretion of contrast from the bilateral kidneys. Stomach/Bowel: No enteric contrast was administered. Stomach is unremarkable for degree of distension. No pathologic dilation of small  or large bowel. The appendix and terminal ileum appear normal. Colonic diverticulosis without findings of acute diverticulitis. Vascular/Lymphatic: No abdominal aortic aneurysm. Prominent periportal and retroperitoneal lymph nodes measuring up to 8 mm in short axis on image 47/2 are favored reactive. No pathologically enlarged abdominal or pelvic lymph nodes. Reproductive: Status post hysterectomy. No adnexal masses. Other: Cutaneous skin thickening with subcutaneous edema and a 9.7 x 5.9 cm phlegmonous collection in the right anterior abdominal wall. Fat containing ventral hernia. Musculoskeletal: Multilevel degenerative changes spine. No acute osseous abnormality. IMPRESSION: 1. Cutaneous skin thickening with subcutaneous edema and a 9.7 x 5.9 cm phlegmonous collection in the right anterior abdominal wall. 2. Gas in the urinary bladder and left upper  pole collecting system which may reflect sequela of recent instrumentation however emphysematous infection also possible. Correlate with laboratory values suggested. 3. Hepatic steatosis with cirrhotic hepatic morphology. 4. Colonic diverticulosis without findings of acute diverticulitis. 5. Emphysema (ICD10-J43.9). Electronically Signed   By: Dahlia Bailiff M.D.   On: 08/16/2021 11:51        Scheduled Meds:  vitamin C  250 mg Oral BID   [START ON 08/19/2021] atorvastatin  10 mg Oral QPM   busPIRone  7.5 mg Oral BID   enoxaparin (LOVENOX) injection  0.5 mg/kg Subcutaneous Q24H   fluconazole  200 mg Oral Daily   insulin aspart  0-15 Units Subcutaneous TID WC   insulin aspart  0-5 Units Subcutaneous QHS   insulin glargine-yfgn  4 Units Subcutaneous Daily   levothyroxine  125 mcg Oral Daily   [START ON 08/18/2021] multivitamin-lutein  1 capsule Oral Daily   Ensure Max Protein  11 oz Oral BID   Continuous Infusions:  ampicillin-sulbactam (UNASYN) IV 3 g (08/17/21 1109)   vancomycin       LOS: 1 day    Time spent: 26 minutes    Sharen Hones, MD [facility]   To contact the attending provider between 7A-7P or the covering provider during after hours 7P-7A, please log into the web site [email or web address] and access using universal [place] password for that web site. If you do not have the password, please call the hospital operator.  08/17/2021, 1:23 PM

## 2021-08-17 NOTE — Progress Notes (Addendum)
Pharmacy Antibiotic Note  Taylor Grimes is a 61 y.o. female admitted on 08/16/2021 with cellulitis.  Pharmacy has been consulted for vancomycin dosing.  Plan: Pt received a loading dose of vancomycin 2000 mg on 1/27. Maintenance dose of 1000 mg q24H was adjusted to 1250 mg q24H. Predicted AUC 518. Goal AUC of 400-550. Plan to order levels after 4th or 5th dose. Scr 0.81, Vd 0.5.   Continue Unasyn 3 g q6H.   Continue fluconazole 200 mg daily.   Height: 5\' 1"  (154.9 cm) Weight: 97.5 kg (215 lb) IBW/kg (Calculated) : 47.8  Temp (24hrs), Avg:99.1 F (37.3 C), Min:97.8 F (36.6 C), Max:100.6 F (38.1 C)  Recent Labs  Lab 08/16/21 1009 08/16/21 1200 08/16/21 1844 08/16/21 2021 08/17/21 0658  WBC 15.4*  --   --   --   --   CREATININE 1.03*  --   --   --  0.81  LATICACIDVEN 2.7* 2.5* 1.6 1.5  --      Estimated Creatinine Clearance: 77 mL/min (by C-G formula based on SCr of 0.81 mg/dL).    No Known Allergies  Antimicrobials this admission: 1/27 Vancomycin    Microbiology results: 1/27 BCx: sent 1/27 tissue Cx:  ABUNDANT GRAM NEGATIVE RODS  MODERATE GRAM POSITIVE COCCI  FEW GRAM POSITIVE RODS 1/27 Ucx: sent    Thank you for allowing pharmacy to be a part of this patients care.  Oswald Hillock, PharmD 08/17/2021 11:21 AM

## 2021-08-17 NOTE — Progress Notes (Signed)
Initial Nutrition Assessment  DOCUMENTATION CODES:   Morbid obesity  INTERVENTION:   Ensure Max protein supplement BID, each supplement provides 150kcal and 30g of protein.  Ocuvite daily for wound healing (provides zinc, vitamin A, vitamin C, Vitamin E, copper, and selenium)  Vitamin C 500mg  po BID  Pt at moderate refeed risk; recommend monitor potassium, magnesium and phosphorus labs daily until stable  NUTRITION DIAGNOSIS:   Increased nutrient needs related to wound healing as evidenced by estimated needs.  GOAL:   Patient will meet greater than or equal to 90% of their needs  MONITOR:   PO intake, Supplement acceptance, Labs, Weight trends, Skin, I & O's  REASON FOR ASSESSMENT:   Malnutrition Screening Tool    ASSESSMENT:   61 y.o. female with h/o DM, HTN, edometrial cancer, anxiety and hypothyroidism who is admitted with abscess to the right abdominal wall/pannus now s/o I & D and VAC placement 1/27  RD working remotely.  Unable to reach pt by phone. Per chart review, pt with poor oral intake pta r/t nausea and abdominal pain. There are no documented meal intakes from this admission. RD will add supplements and vitamins to support wound healing. Per chart, pt is down 5lbs(2%) pta; this is not significant. RD will obtain nutrition related exam and history at follow up.   Medications reviewed and include: lovenox, diflucan, insulin, synthroid, unasyn, vancomycin   Labs reviewed: Na 133(L), K 3.6 wnl Wbc- 15.4(H) Cbgs- 122, 82 x 24 hrs AIC 7.4(H)- 02/2021  NUTRITION - FOCUSED PHYSICAL EXAM: Unable to perform at this time   Diet Order:   Diet Order             Diet Carb Modified Fluid consistency: Thin; Room service appropriate? Yes  Diet effective now                  EDUCATION NEEDS:   Not appropriate for education at this time  Skin:  Skin Assessment: Reviewed RN Assessment (Incision abdomen, VAC)  Last BM:  1/27  Height:   Ht Readings from  Last 1 Encounters:  08/16/21 5\' 1"  (1.549 m)    Weight:   Wt Readings from Last 1 Encounters:  08/16/21 97.5 kg    Ideal Body Weight:  47.7 kg  BMI:  Body mass index is 40.62 kg/m.  Estimated Nutritional Needs:   Kcal:  1900-2200kcal/day  Protein:  95-110g/day  Fluid:  1.5-1.7L/day  Koleen Distance MS, RD, LDN Please refer to [facility] for RD and/or RD on-call/weekend/after hours pager

## 2021-08-17 NOTE — Progress Notes (Signed)
[facility] Hospital Day(s): 1.   Post op day(s): 1 Day Post-Op.   Interval History: Patient seen and examined, no acute events or new complaints overnight. Patient reports good pain control.  Review of Systems:  Constitutional: denies fever, chills  Respiratory: denies any shortness of breath  Cardiovascular: denies chest pain or palpitations  Musculoskeletal: denies pain, decreased motor or sensation  Vital signs in last 24 hours: [min-max] current  Temp:  [97.8 F (36.6 C)-98.2 F (36.8 C)] 97.9 F (36.6 C) (01/28 1603) Pulse Rate:  [73-83] 79 (01/28 1603) Resp:  [15-20] 16 (01/28 1603) BP: (95-117)/(54-76) 111/76 (01/28 1603) SpO2:  [99 %-100 %] 100 % (01/28 1603)     Height: 5\' 1"  (154.9 cm) Weight: 97.5 kg BMI (Calculated): 40.64   Intake/Output last 2 shifts:  01/27 0701 - 01/28 0700 In: 2325 [P.O.:125; I.V.:2000; IV Piggyback:200] Out: 20 [Blood:20]   Physical Exam:  Constitutional: alert, cooperative and no distress  Respiratory: breathing non-labored at rest  Cardiovascular: regular rate and sinus rhythm  Gastrointestinal: soft, non-tender, and non-distended Integumentary: Wound VAC in place with serosanguineous output as expected.  No appreciable leaks present.  Labs:  CBC Latest Ref Rng & Units 08/16/2021 09/07/2020 08/19/2019  WBC 4.0 - 10.5 K/uL 15.4(H) 6.6 8.6  Hemoglobin 12.0 - 15.0 g/dL 13.2 13.3 13.3  Hematocrit 36.0 - 46.0 % 39.6 39.1 39.9  Platelets 150 - 400 K/uL 196 184.0 214.0   CMP Latest Ref Rng & Units 08/17/2021 08/16/2021 09/07/2020  Glucose 70 - 99 mg/dL - 358(H) 141(H)  BUN 8 - 23 mg/dL - 16 13  Creatinine 0.44 - 1.00 mg/dL 0.81 1.03(H) 0.84  Sodium 135 - 145 mmol/L - 133(L) 139  Potassium 3.5 - 5.1 mmol/L - 3.6 4.1  Chloride 98 - 111 mmol/L - 101 104  CO2 22 - 32 mmol/L - 22 29  Calcium 8.9 - 10.3 mg/dL - 8.5(L) 9.6  Total Protein 6.5 - 8.1 g/dL - 7.7 6.7  Total Bilirubin 0.3 - 1.2 mg/dL - 3.1(H)  0.9  Alkaline Phos 38 - 126 U/L - 114 102  AST 15 - 41 U/L - 29 31  ALT 0 - 44 U/L - 22 32     Imaging studies: No new pertinent imaging studies   Assessment/Plan:  61 y.o. female with  1 Day Post-Op s/p I&D right lower quadrant abdominal abscess, with debridement and placement of negative pressure wound therapy, complicated by pertinent comorbidities including .  Patient Active Problem List   Diagnosis Date Noted   Abscess of abdominal wall 08/16/2021   Sepsis ([place]) 08/16/2021   Uncontrolled type 2 diabetes mellitus with hyperglycemia ([place]) 08/16/2021   Hyperlipidemia associated with type 2 diabetes mellitus ([place]) 08/17/2018   Hypertrophic toenail 02/02/2018   Anxiety disorder 09/12/2015   Low HDL (under 40) 10/24/2014   Routine general medical examination at a health care facility 03/08/2014   History of endometrial cancer 12/15/2012   Colon cancer screening 11/01/2012   DM type 2 (diabetes mellitus, type 2) ([place]) 06/30/2012   Varicose veins of leg with swelling, left 11/18/2011   Obesity, Class III, BMI 40-49.9 (morbid obesity) ([place]) 11/12/2010   Essential hypertension 07/12/2009   Hypothyroidism (acquired) 05/16/2009    -Anticipate wound VAC change on Monday, it seems that her insurance status may not allow her to keep the wound VAC at home, if so we will need to transition to saline dressing changes twice daily.  -Would continue IV antibiotics for  now,  -Glycemic control.  -Pain control and advancement of diet as tolerated.  All of the above findings and recommendations were discussed with the patient, and all of patient's questions were answered to their expressed satisfaction.  -- Ronny Bacon, M.D., [facility] 08/17/2021

## 2021-08-18 LAB — GLUCOSE, CAPILLARY
Glucose-Capillary: 64 mg/dL — ABNORMAL LOW (ref 70–99)
Glucose-Capillary: 81 mg/dL (ref 70–99)
Glucose-Capillary: 120 mg/dL — ABNORMAL HIGH (ref 70–99)
Glucose-Capillary: 91 mg/dL (ref 70–99)
Glucose-Capillary: 161 mg/dL — ABNORMAL HIGH (ref 70–99)
Glucose-Capillary: 136 mg/dL — ABNORMAL HIGH (ref 70–99)
Glucose-Capillary: 107 mg/dL — ABNORMAL HIGH (ref 70–99)

## 2021-08-18 NOTE — Progress Notes (Signed)
Pt BS 62 this AM, OJ given, will continue to monitor per protocol.

## 2021-08-18 NOTE — Progress Notes (Signed)
PROGRESS NOTE    Taylor Grimes  NID:[id number] DOB: 1960-10-08 DOA: 08/16/2021 PCP: Abner Greenspan, MD    Brief Narrative:  Taylor Grimes is a 61 year old female with history of morbid obesity, type 2 diabetes, hypertension, hypothyroidism, who presents to the hospital with 1 week history of sleepiness and nausea, 2 days of lower abdomen redness. She was diagnosed with sepsis and right lower abdomen abscess with cellulitis.  Antibiotic was started, I&D was performed by general surgery on 1/27     Assessment & Plan:   Principal Problem:   Abscess of abdominal wall Active Problems:   Hypothyroidism (acquired)   Essential hypertension   Obesity, Class III, BMI 40-49.9 (morbid obesity) ([place])   Sepsis ([place])   Uncontrolled type 2 diabetes mellitus with hyperglycemia (HCC)   Sepsis secondary to cellulitis Abdominal wall cellulitis with abscess. Patient is status post I&D, culture still pending. Will continue antibiotics with vancomycin, Unasyn as well as Diflucan.     Erythematous pyelonephritis. Patient has abnormal urine, CT scan showed gas in the bladder and upper pole of his kidney.  Cannot rule out erythematous pyelonephritis.   Urine culture has low counts of bacteria, continue Unasyn.  Uncontrolled type 2 diabetes with hyperglycemia Hypoglycemia Patient developed hypoglycemia this morning, will discontinue all scheduled insulin.  Continue sliding scale only.  Morbid obesity.  Essential hypertension Continues on home medicines   DVT prophylaxis: Lovenox  Code Status: full Family Communication:  Disposition Plan: Home tomorrow   Status is: Inpatient  Remains inpatient appropriate because: IV antibiotics and severity disease.        I/O last 3 completed shifts: In: 100 [IV Piggyback:100] Out: 400 [Urine:400] No intake/output data recorded.     Consultants:  General surgery,  Procedures: I&D  Antimicrobials: Vancomycin and  Unasyn  Subjective: Patient doing well today, abdominal wall redness much improved.  Wound VAC. Denies any short of breath or cough. No abdominal pain nausea vomiting.  Objective: Vitals:   08/17/21 1603 08/17/21 2122 08/18/21 0516 08/18/21 0803  BP: 111/76 102/78 125/72 140/85  Pulse: 79 80 80 73  Resp: 16 20 20 18   Temp: 97.9 F (36.6 C) 97.9 F (36.6 C) 98 F (36.7 C) 98.1 F (36.7 C)  TempSrc:   Oral Oral  SpO2: 100% 97% 100% 99%  Weight:      Height:        Intake/Output Summary (Last 24 hours) at 08/18/2021 1122 Last data filed at 08/18/2021 0500 Gross per 24 hour  Intake 100 ml  Output 400 ml  Net -300 ml   Filed Weights   08/16/21 0938  Weight: 97.5 kg    Examination:  General exam: Appears calm and comfortable  Respiratory system: Clear to auscultation. Respiratory effort normal. Cardiovascular system: S1 & S2 heard, RRR. No JVD, murmurs, rubs, gallops or clicks. No pedal edema. Gastrointestinal system: Abdomen is nondistended, soft and nontender. No organomegaly or masses felt. Normal bowel sounds heard.  Lower abdomen wall redness essentially resolved.  Wound VAC in place. Central nervous system: Alert and oriented. No focal neurological deficits. Extremities: Symmetric 5 x 5 power. Skin: No rashes, lesions or ulcers Psychiatry: Judgement and insight appear normal. Mood & affect appropriate.     Data Reviewed: I have personally reviewed following labs and imaging studies  CBC: Recent Labs  Lab 08/16/21 1009  WBC 15.4*  NEUTROABS 12.4*  HGB 13.2  HCT 39.6  MCV 91.5  PLT 144   Basic Metabolic Panel: Recent Labs  Lab 08/16/21 1009 08/17/21 0658  NA 133*  --   K 3.6  --   CL 101  --   CO2 22  --   GLUCOSE 358*  --   BUN 16  --   CREATININE 1.03* 0.81  CALCIUM 8.5*  --    GFR: Estimated Creatinine Clearance: 77 mL/min (by C-G formula based on SCr of 0.81 mg/dL). Liver Function Tests: Recent Labs  Lab 08/16/21 1009  AST 29  ALT 22   ALKPHOS 114  BILITOT 3.1*  PROT 7.7  ALBUMIN 3.1*   No results for input(s): LIPASE, AMYLASE in the last 168 hours. No results for input(s): AMMONIA in the last 168 hours. Coagulation Profile: Recent Labs  Lab 08/16/21 1009  INR 1.1   Cardiac Enzymes: No results for input(s): CKTOTAL, CKMB, CKMBINDEX, TROPONINI in the last 168 hours. BNP (last 3 results) No results for input(s): PROBNP in the last 8760 hours. HbA1C: No results for input(s): HGBA1C in the last 72 hours. CBG: Recent Labs  Lab 08/17/21 1606 08/18/21 0424 08/18/21 0513 08/18/21 0612 08/18/21 0803  GLUCAP 105* 64* 81 120* 91   Lipid Profile: No results for input(s): CHOL, HDL, LDLCALC, TRIG, CHOLHDL, LDLDIRECT in the last 72 hours. Thyroid Function Tests: No results for input(s): TSH, T4TOTAL, FREET4, T3FREE, THYROIDAB in the last 72 hours. Anemia Panel: No results for input(s): VITAMINB12, FOLATE, FERRITIN, TIBC, IRON, RETICCTPCT in the last 72 hours. Sepsis Labs: Recent Labs  Lab 08/16/21 1009 08/16/21 1200 08/16/21 1844 08/16/21 2021  LATICACIDVEN 2.7* 2.5* 1.6 1.5    Recent Results (from the past 240 hour(s))  Blood Culture (routine x 2)     Status: None (Preliminary result)   Collection Time: 08/16/21 10:09 AM   Specimen: BLOOD  Result Value Ref Range Status   Specimen Description BLOOD RIGHT ANTECUBITAL  Final   Special Requests   Final    BOTTLES DRAWN AEROBIC AND ANAEROBIC Blood Culture results may not be optimal due to an inadequate volume of blood received in culture bottles   Culture   Final    NO GROWTH 2 DAYS Performed at [facility], [address] [place], [place] [address]    Report Status PENDING  Incomplete  Blood Culture (routine x 2)     Status: None (Preliminary result)   Collection Time: 08/16/21 10:10 AM   Specimen: BLOOD  Result Value Ref Range Status   Specimen Description BLOOD LEFT ANTECUBITAL  Final   Special Requests   Final    BOTTLES DRAWN AEROBIC  AND ANAEROBIC Blood Culture adequate volume   Culture   Final    NO GROWTH 2 DAYS Performed at [facility], [address] [place], [place] [address]    Report Status PENDING  Incomplete  Resp Panel by RT-PCR (Flu A&B, Covid) Nasopharyngeal Swab     Status: None   Collection Time: 08/16/21 10:46 AM   Specimen: Nasopharyngeal Swab; Nasopharyngeal(NP) swabs in vial transport medium  Result Value Ref Range Status   SARS Coronavirus 2 by RT PCR NEGATIVE NEGATIVE Final    Comment: (NOTE) SARS-CoV-2 target nucleic acids are NOT DETECTED.  The SARS-CoV-2 RNA is generally detectable in upper respiratory specimens during the acute phase of infection. The lowest concentration of SARS-CoV-2 viral copies this assay can detect is 138 copies/mL. A negative result does not preclude SARS-Cov-2 infection and should not be used as the sole basis for treatment or other patient management decisions. A negative result may occur with  improper specimen collection/handling, submission of specimen other than nasopharyngeal swab, presence of viral mutation(s) within the areas targeted by this assay, and inadequate number of viral copies(<138 copies/mL). A negative result must be combined with clinical observations, patient history, and epidemiological information. The expected result is Negative.  Fact Sheet for Patients:  EntrepreneurPulse.com.au  Fact Sheet for Healthcare Providers:  IncredibleEmployment.be  This test is no t yet approved or cleared by the [place] FDA and  has been authorized for detection and/or diagnosis of SARS-CoV-2 by FDA under an Emergency Use Authorization (EUA). This EUA will remain  in effect (meaning this test can be used) for the duration of the COVID-19 declaration under Section 564(b)(1) of the Act, 21 U.S.C.section 360bbb-3(b)(1), unless the authorization is terminated  or revoked sooner.       Influenza A by PCR  NEGATIVE NEGATIVE Final   Influenza B by PCR NEGATIVE NEGATIVE Final    Comment: (NOTE) The Xpert Xpress SARS-CoV-2/FLU/RSV plus assay is intended as an aid in the diagnosis of influenza from Nasopharyngeal swab specimens and should not be used as a sole basis for treatment. Nasal washings and aspirates are unacceptable for Xpert Xpress SARS-CoV-2/FLU/RSV testing.  Fact Sheet for Patients: EntrepreneurPulse.com.au  Fact Sheet for Healthcare Providers: IncredibleEmployment.be  This test is not yet approved or cleared by the [place] FDA and has been authorized for detection and/or diagnosis of SARS-CoV-2 by FDA under an Emergency Use Authorization (EUA). This EUA will remain in effect (meaning this test can be used) for the duration of the COVID-19 declaration under Section 564(b)(1) of the Act, 21 U.S.C. section 360bbb-3(b)(1), unless the authorization is terminated or revoked.  Performed at [facility], [address] [place], [place] [address]   Urine Culture     Status: Abnormal   Collection Time: 08/16/21 11:55 AM   Specimen: Urine, Clean Catch  Result Value Ref Range Status   Specimen Description   Final    URINE, CLEAN CATCH Performed at [facility], [address] [place], [place] [address]    Special Requests   Final    NONE Performed at [facility], [address], [place], [place] [address]    Culture (A)  Final    <10,000 COLONIES/mL INSIGNIFICANT GROWTH Performed at [place] [place] [address] [place], [place] [address]    Report Status 08/17/2021 FINAL  Final  Aerobic/Anaerobic Culture w Gram Stain (surgical/deep wound)     Status: None (Preliminary result)   Collection Time: 08/16/21  2:14 PM   Specimen: PATH Other; Tissue  Result Value Ref Range Status   Specimen Description ABSCESS  Final   Special Requests ABDOMINAL WALL  Final   Gram Stain   Final    MODERATE WBC  PRESENT,BOTH PMN AND MONONUCLEAR ABUNDANT GRAM NEGATIVE RODS MODERATE GRAM POSITIVE COCCI FEW GRAM POSITIVE RODS    Culture   Final    NO GROWTH < 24 HOURS Performed at [facility], [place] [address] [place] [employer], [place] [address]    Report Status PENDING  Incomplete         Radiology Studies: No results found.      Scheduled Meds:  vitamin C  250 mg Oral BID   [START ON 08/19/2021] atorvastatin  10 mg Oral QPM   busPIRone  7.5 mg Oral BID   enoxaparin (LOVENOX) injection  0.5 mg/kg Subcutaneous Q24H   fluconazole  200 mg Oral Daily   insulin aspart  0-15 Units  Subcutaneous TID WC   insulin aspart  0-5 Units Subcutaneous QHS   levothyroxine  125 mcg Oral Daily   multivitamin-lutein  1 capsule Oral Daily   Ensure Max Protein  11 oz Oral BID   Continuous Infusions:  ampicillin-sulbactam (UNASYN) IV 3 g (08/18/21 0919)   vancomycin 1,250 mg (08/17/21 2023)     LOS: 2 days    Time spent: 27 minutes    Sharen Hones, MD [facility]   To contact the attending provider between 7A-7P or the covering provider during after hours 7P-7A, please log into the web site [email or web address] and access using universal [place] password for that web site. If you do not have the password, please call the hospital operator.  08/18/2021, 11:22 AM

## 2021-08-19 LAB — HEMOGLOBIN A1C
Hgb A1c MFr Bld: 8.4 % — ABNORMAL HIGH (ref 4.8–5.6)
Hgb A1c MFr Bld: 8.3 % — ABNORMAL HIGH (ref 4.8–5.6)
Mean Plasma Glucose: 194 mg/dL
Mean Plasma Glucose: 192 mg/dL

## 2021-08-19 LAB — GLUCOSE, CAPILLARY
Glucose-Capillary: 75 mg/dL (ref 70–99)
Glucose-Capillary: 135 mg/dL — ABNORMAL HIGH (ref 70–99)

## 2021-08-19 LAB — CREATININE, SERUM
Creatinine, Ser: 0.87 mg/dL (ref 0.44–1.00)
GFR, Estimated: >60 mL/min (ref >60)

## 2021-08-19 MED ORDER — AMOXICILLIN-POT CLAVULANATE 875-125 MG PO TABS: 1.0000 | ORAL_TABLET | Freq: Two times a day (BID) | ORAL | 0 refills | Status: AC

## 2021-08-19 NOTE — Consult Note (Addendum)
WOC Nurse Consult Note: Patient receiving care in [facility] Reason for Consult: VAC change to abdomen Wound type: surgical The patient told me she didn't think her Family Medicaid would cover home VAC changes or home health visits.  I have reached out to PA-C Z. Olean Ree and the Case Manager and asked about this.  There is no reason to change the VAC dressing if they are going to send her home today with saline moistened dressings.  Addendum 8485: The discharge plan is to have the bedside nurse remove the [facility] dressing and teach the patient how to do saline moistened gauze dressings twice daily. Val Riles, RN, MSN, CWOCN, CNS-BC, pager [phone]

## 2021-08-19 NOTE — Discharge Summary (Signed)
Physician Discharge Summary  Patient ID: Taylor Grimes MRN: [id number] DOB/AGE: [birth year] 64 y.o.  Admit date: 08/16/2021 Discharge date: 08/19/2021  Admission Diagnoses:  Discharge Diagnoses:  Principal Problem:   Abscess of abdominal wall Active Problems:   Hypothyroidism (acquired)   Essential hypertension   Obesity, Class III, BMI 40-49.9 (morbid obesity) ([place])   Sepsis ([place])   Uncontrolled type 2 diabetes mellitus with hyperglycemia ([place])   Discharged Condition: good  Hospital Course:   Taylor Grimes is a 61 year old female with history of morbid obesity, type 2 diabetes, hypertension, hypothyroidism, who presents to the hospital with 1 week history of sleepiness and nausea, 2 days of lower abdomen redness. She was diagnosed with sepsis and right lower abdomen abscess with cellulitis.  Antibiotic was started, I&D was performed by general surgery on 1/27   Sepsis secondary to Streptococcus cellulitis Abdominal wall cellulitis with abscess secondary to Streptococcus viridans. Patient is status post I&D, condition much improved.  Cellulitis incision resolved.  She is treated with vancomycin and Unasyn.  Discussed with microbiology lab, there is some anaerobic growth, aerobic culture on the grow Streptococcus organism.  No staph. Discussed with general surgery, recommended wet-to-dry with the abscess site.  We will follow-up with general surgery in the near future.     Erythematous pyelonephritis. Patient has abnormal urine, CT scan showed gas in the bladder and upper pole of his kidney.  Cannot rule out erythematous pyelonephritis.   Urine culture has low counts of bacteria, continue 5 days of Augmentin.  Uncontrolled type 2 diabetes with hyperglycemia Hypoglycemia Resume home regimen.  Morbid obesity.  Essential hypertension Continues on home medicines  Consults: general surgery  Significant Diagnostic Studies:   Treatments: Antibiotics with vancomycin and  Unasyn.  Discharge Exam: Blood pressure 137/73, pulse 71, temperature 98.3 F (36.8 C), resp. rate 18, height 5\' 1"  (1.549 m), weight 97.5 kg, last menstrual period 07/21/2008, SpO2 100 %. General appearance: alert and cooperative Resp: clear to auscultation bilaterally Cardio: regular rate and rhythm, S1, S2 normal, no murmur, click, rub or gallop GI: soft, non-tender; bowel sounds normal; no masses,  no organomegaly Extremities: extremities normal, atraumatic, no cyanosis or edema  Lower abdomen skin redness has resolved.  Disposition: Discharge disposition: 01-Home or Self Care       Discharge Instructions     Diet - low sodium heart healthy   Complete by: As directed    Discharge wound care:   Complete by: As directed    Wet to dry   Increase activity slowly   Complete by: As directed       Allergies as of 08/19/2021   No Known Allergies      Medication List     TAKE these medications    amoxicillin-clavulanate 875-125 MG tablet Commonly known as: Augmentin Take 1 tablet by mouth 2 (two) times daily for 5 days.   atorvastatin 10 MG tablet Commonly known as: LIPITOR Take 1 tablet (10 mg total) by mouth daily. In evening with a low fat snack   busPIRone 15 MG tablet Commonly known as: BUSPAR Take 0.5 tablets (7.5 mg total) by mouth 2 (two) times daily.   glimepiride 4 MG tablet Commonly known as: AMARYL TAKE 1 TABLET BY MOUTH DAILY BEFORE BREAKFAST.   levothyroxine 125 MCG tablet Commonly known as: SYNTHROID TAKE 1 TABLET BY MOUTH EVERY DAY   losartan 100 MG tablet Commonly known as: COZAAR Take 0.5 tablets (50 mg total) by mouth daily.   metFORMIN 1000 MG  tablet Commonly known as: GLUCOPHAGE Take 1 tablet (1,000 mg total) by mouth 2 (two) times daily with a meal.               Discharge Care Instructions  (From admission, onward)           Start     Ordered   08/19/21 0000  Discharge wound care:       Comments: Wet to dry    08/19/21 3825            Follow-up Information     Ronny Bacon, MD. Schedule an appointment as soon as possible for a visit in 2 week(s).   Specialty: General Surgery Why: s/p RLQ abdominal wall abscess/debridement Contact information: [address] [place] [address] [phone]         Abner Greenspan, MD Follow up in 1 week(s).   Specialties: Family Medicine, Radiology Contact information: [address] [place] [address] [phone]                32 minutes Signed: Sharen Hones 08/19/2021, 9:44 AM

## 2021-08-19 NOTE — Discharge Instructions (Signed)
In addition to included general post-operative instructions,  Diet: Resume home diet.   Wound care: Pack RLQ wound daily with saline moistened gauze, cover with dry gauze +/- ABD pad, and secure with tape. This should be preformed daily and as needed. It is okay to remove dressing to shower; no bathing.   Call office [phone] / [phone]) at any time if any questions, worsening pain, fevers/chills, bleeding, drainage from incision site, or other concerns.

## 2021-08-19 NOTE — TOC Initial Note (Signed)
Transition of Care [facility]) - Initial/Assessment Note    Patient Details  Name: Taylor Grimes MRN: [id number] Date of Birth: 09-21-1960  [facility] CM/SW Contact:    Taylor Sessions, RN Phone Number: 08/19/2021, 10:43 AM  Clinical Narrative:                 Admitted for: Abdominal abscess  Admitted from: Home with son [id number] [facility] - [facility] for discharge medication.  Patient confirms she will be able to afford at discharge  Current home health/prior home health/DME: NA  Patient to discharge today with wet to dry dressing. Bedside RN to provide education and supplies prior to discharge.  Patient states if she needs assistance her sister in law will be able to assist at home.  Son to pick up at transport.  [place] delivered to room with Taylor Grimes from [place].    Expected Discharge Plan: Home/Self Care Barriers to Discharge: Barriers Resolved   Patient Goals and CMS Choice        Expected Discharge Plan and Services Expected Discharge Plan: Home/Self Care       Living arrangements for the past 2 months: Mobile Home Expected Discharge Date: 08/19/21               DME Arranged: Gilford Rile rolling DME Agency: [employer] Date DME Agency Contacted: 08/19/21   Representative spoke with at DME Agency: Taylor Grimes            Prior Living Arrangements/Services Living arrangements for the past 2 months: Mobile Home Lives with:: Adult Children   Do you feel safe going back to the place where you live?: Yes      Need for Family Participation in Patient Care: Yes (Comment) Care giver support system in place?: Yes (comment)   Criminal Activity/Legal Involvement Pertinent to Current Situation/Hospitalization: No - Comment as needed  Activities of Daily Living Home Assistive Devices/Equipment: None ADL Screening (condition at time of admission) Patient's cognitive ability adequate to safely complete daily activities?: Yes Is the patient deaf  or have difficulty hearing?: No Does the patient have difficulty seeing, even when wearing glasses/contacts?: No Does the patient have difficulty concentrating, remembering, or making decisions?: No Patient able to express need for assistance with ADLs?: Yes Does the patient have difficulty dressing or bathing?: No Independently performs ADLs?: Yes (appropriate for developmental age) Does the patient have difficulty walking or climbing stairs?: No Weakness of Legs: None Weakness of Arms/Hands: None  Permission Sought/Granted                  Emotional Assessment Appearance:: Appears stated age Attitude/Demeanor/Rapport: Gracious Affect (typically observed): Accepting Orientation: : Oriented to Self, Oriented to Place, Oriented to  Time, Oriented to Situation Alcohol / Substance Use: Not Applicable Psych Involvement: No (comment)  Admission diagnosis:  Abscess of abdominal wall [L02.211] Abdominal wall cellulitis [L03.311] Acute sepsis [facility]) [A41.9] Patient Active Problem List   Diagnosis Date Noted   Abscess of abdominal wall 08/16/2021   Sepsis ([place]) 08/16/2021   Uncontrolled type 2 diabetes mellitus with hyperglycemia ([place]) 08/16/2021   Hyperlipidemia associated with type 2 diabetes mellitus ([place]) 08/17/2018   Hypertrophic toenail 02/02/2018   Anxiety disorder 09/12/2015   Low HDL (under 40) 10/24/2014   Routine general medical examination at a health care facility 03/08/2014   History of endometrial cancer 12/15/2012   Colon cancer screening 11/01/2012   DM type 2 (diabetes mellitus, type 2) ([place]) 06/30/2012   Varicose veins  of leg with swelling, left 11/18/2011   Obesity, Class III, BMI 40-49.9 (morbid obesity) ([place]) 11/12/2010   Essential hypertension 07/12/2009   Hypothyroidism (acquired) 05/16/2009   PCP:  Taylor Greenspan, MD Pharmacy:   [facility] [address] [place] [address] Phone: [phone] Fax:  [facility] [place], [place] [address] [place] [address] Phone: [phone] Fax: [phone]  [id number] [place], [place] [address] [place] [address] Phone: [phone] Fax: [phone]     Social Determinants of Health (SDOH) Interventions    Readmission Risk Interventions No flowsheet data found.

## 2021-08-19 NOTE — Progress Notes (Signed)
[facility] Hospital Day(s): 3.   Post op day(s): 3 Days Post-Op.   Interval History:  Patient seen and examined No acute events or new complaints overnight.  Patient reports she is feeling good; no abdominal pain No fever, chills No new labs this morning Cx growing mixed organisms; reincubated She continues on Unasyn and Vancomycin  Wound vac with 25 ccs out; serosanguinous She is anxious to go home   Vital signs in last 24 hours: [min-max] current  Temp:  [97.7 F (36.5 C)-98.6 F (37 C)] 97.7 F (36.5 C) (01/30 0405) Pulse Rate:  [73-82] 73 (01/30 0405) Resp:  [18-20] 18 (01/30 0405) BP: (119-140)/(69-85) 123/69 (01/30 0405) SpO2:  [98 %-100 %] 98 % (01/30 0405)     Height: 5\' 1"  (154.9 cm) Weight: 97.5 kg BMI (Calculated): 40.64   Intake/Output last 2 shifts:  01/29 0701 - 01/30 0700 In: -  Out: 25 [Drains:25]   Physical Exam:  Constitutional: alert, cooperative and no distress  Respiratory: breathing non-labored at rest  Cardiovascular: regular rate and sinus rhythm  Gastrointestinal: soft, non-tender, and non-distended Integumentary: Wound vac to RLQ; site CDI; output serosanguinous  Labs:  CBC Latest Ref Rng & Units 08/16/2021 09/07/2020 08/19/2019  WBC 4.0 - 10.5 K/uL 15.4(H) 6.6 8.6  Hemoglobin 12.0 - 15.0 g/dL 13.2 13.3 13.3  Hematocrit 36.0 - 46.0 % 39.6 39.1 39.9  Platelets 150 - 400 K/uL 196 184.0 214.0   CMP Latest Ref Rng & Units 08/19/2021 08/17/2021 08/16/2021  Glucose 70 - 99 mg/dL - - 358(H)  BUN 8 - 23 mg/dL - - 16  Creatinine 0.44 - 1.00 mg/dL 0.87 0.81 1.03(H)  Sodium 135 - 145 mmol/L - - 133(L)  Potassium 3.5 - 5.1 mmol/L - - 3.6  Chloride 98 - 111 mmol/L - - 101  CO2 22 - 32 mmol/L - - 22  Calcium 8.9 - 10.3 mg/dL - - 8.5(L)  Total Protein 6.5 - 8.1 g/dL - - 7.7  Total Bilirubin 0.3 - 1.2 mg/dL - - 3.1(H)  Alkaline Phos 38 - 126 U/L - - 114  AST 15 - 41 U/L - - 29  ALT 0 - 44 U/L - - 22      Imaging studies: No new pertinent imaging studies   Assessment/Plan:  61 y.o. female 3 Days Post-Op s/p right lower quadrant abdominal abscess, with debridement and placement of negative pressure wound therapy   - Unfortunately, she can not get home health for wound vac care at home. Discussed options with patient. She is agreeable with transitioning to wet to dry dressings daily at home. She feels comfortable and her son lives near by to help.   - Continue IV Abx (Unasyn/Vancomycin); follow up Cx. She will need PO Abx for home to complete 14 days total (IV+PO)  - Pain control prn  - Further management per primary service   - Discharge Planning; She is okay for DC from surgical standpoint, wound care recommendations as above. She can follow up in ~2 weeks for wound check.   All of the above findings and recommendations were discussed with the patient, and the medical team, and all of patient's questions were answered to her expressed satisfaction.  -- Edison Simon, PA-C [facility] 08/19/2021, 7:40 AM [phone] M-F: 7am - 4pm

## 2021-08-19 NOTE — Plan of Care (Signed)
Patient discharged to home with son.  Patient wound care instructions reviewed and demonstrated. Supplies given and discharge packet reviewed to include medciations, appointment follow-up, care instructions and medication voucher with patient who verbalized understanding.  PIV removed prior to discharge.  Wound vac removed and wet to dry dressing placed as ordered.

## 2021-08-20 ENCOUNTER — Other Ambulatory Visit: Payer: Self-pay

## 2021-08-20 ENCOUNTER — Encounter: Payer: Self-pay | Admitting: Family Medicine

## 2021-08-20 ENCOUNTER — Ambulatory Visit (INDEPENDENT_AMBULATORY_CARE_PROVIDER_SITE_OTHER): Payer: Self-pay | Admitting: Family Medicine

## 2021-08-20 VITALS — BP 130/83 | HR 80 | Temp 97.4°F | Ht 62.0 in | Wt 212.5 lb

## 2021-08-20 DIAGNOSIS — E119 Type 2 diabetes mellitus without complications: Secondary | ICD-10-CM

## 2021-08-20 DIAGNOSIS — L02211 Cutaneous abscess of abdominal wall: Secondary | ICD-10-CM

## 2021-08-20 DIAGNOSIS — A419 Sepsis, unspecified organism: Secondary | ICD-10-CM

## 2021-08-20 NOTE — Assessment & Plan Note (Signed)
Recent hospitalization for this in the setting of cellulitis (low abdomen) and sepsis Reviewed hospital records, lab results and studies in detail  Much clinical improvement/reassuring exam  Pt has been able to dress/pack her wound  Planning f/u with gen surgeon on 09/03/21 but will call earlier if needed  F/u planned here in mid feb for health mt and can re check wound then as well

## 2021-08-20 NOTE — Progress Notes (Signed)
Subjective:    Patient ID: Taylor Grimes, female    DOB: Oct 25, 1960, 61 y.o.   MRN: [id number]  This visit occurred during the SARS-CoV-2 public health emergency.  Safety protocols were in place, including screening questions prior to the visit, additional usage of staff PPE, and extensive cleaning of exam room while observing appropriate contact time as indicated for disinfecting solutions.   HPI Pt presents for f/u of hospitalization for abd cellulitis with abscess  Wt Readings from Last 3 Encounters:  08/20/21 212 lb 8 oz (96.4 kg)  08/16/21 215 lb (97.5 kg)  06/27/21 216 lb 9.6 oz (98.2 kg)   38.87 kg/m Pt was hospitalized from 1/27 to 08/19/21 for cellulitis and abscess of low abdomen causing sepsis   Hosp course as follows:  Hospital Course:    Taylor Grimes is a 61 year old female with history of morbid obesity, type 2 diabetes, hypertension, hypothyroidism, who presents to the hospital with 1 week history of sleepiness and nausea, 2 days of lower abdomen redness. She was diagnosed with sepsis and right lower abdomen abscess with cellulitis.  Antibiotic was started, I&D was performed by general surgery on 1/27   Sepsis secondary to Streptococcus cellulitis Abdominal wall cellulitis with abscess secondary to Streptococcus viridans. Patient is status post I&D, condition much improved.  Cellulitis incision resolved.  She is treated with vancomycin and Unasyn.  Discussed with microbiology lab, there is some anaerobic growth, aerobic culture on the grow Streptococcus organism.  No staph. Discussed with general surgery, recommended wet-to-dry with the abscess site.  We will follow-up with general surgery in the near future.   Pt has f/u appt with Dr Christian Mate on 09/03/21 for a re check    blood culture results did return nl   Lab Results  Component Value Date   CREATININE 0.87 08/19/2021   BUN 16 08/16/2021   NA 133 (L) 08/16/2021   K 3.6 08/16/2021   CL 101 08/16/2021    CO2 22 08/16/2021   Lab Results  Component Value Date   WBC 15.4 (H) 08/16/2021   HGB 13.2 08/16/2021   HCT 39.6 08/16/2021   MCV 91.5 08/16/2021   PLT 196 08/16/2021      Erythematous pyelonephritis. Patient has abnormal urine, CT scan showed gas in the bladder and upper pole of his kidney.  Cannot rule out erythematous pyelonephritis.   Urine culture has low counts of bacteria, continue 5 days of Augmentin.  Uncontrolled type 2 diabetes with hyperglycemia Hypoglycemia Resume home regimen.  Lab Results  Component Value Date   HGBA1C 8.3 (H) 08/17/2021   Amaryl 4 mg daily  Metformin 1000 mg bid  Takes arb and statin   Morbid obesity.  Essential hypertension Continues on home medicines   Consults: general surgery   Significant Diagnostic Studies:    Treatments: Antibiotics with vancomycin and Unasyn.  HTN  BP Readings from Last 3 Encounters:  08/20/21 130/83  08/19/21 137/73  06/27/21 128/82   Well controlled   Losartan 50 mg daily   She takes it easy at home  Has a family memory to pack her wound and her sister in law will help  It is a difficult spot  Not a lot of drainage that she can tell/ just a little blood on it  Using gauze and solution - wet and then dry   Has not needed pain medicine at all / has tolerated it well  Feels tired but much better   Patient Active Problem List  Diagnosis Date Noted   Abscess of abdominal wall 08/16/2021   Sepsis ([place]) 08/16/2021   Uncontrolled type 2 diabetes mellitus with hyperglycemia ([place]) 08/16/2021   Hyperlipidemia associated with type 2 diabetes mellitus ([place]) 08/17/2018   Hypertrophic toenail 02/02/2018   Anxiety disorder 09/12/2015   Low HDL (under 40) 10/24/2014   Routine general medical examination at a health care facility 03/08/2014   History of endometrial cancer 12/15/2012   Colon cancer screening 11/01/2012   DM type 2 (diabetes mellitus, type 2) ([place]) 06/30/2012   Varicose veins of leg with  swelling, left 11/18/2011   Obesity, Class III, BMI 40-49.9 (morbid obesity) ([place]) 11/12/2010   Essential hypertension 07/12/2009   Hypothyroidism (acquired) 05/16/2009   Past Medical History:  Diagnosis Date   Anxiety    Breast lesion    benign   Diabetes mellitus without complication (HCC)    Elevated transaminase level    ? fatty liver    History of endometrial cancer 12/15/2012   FIGO Grade 2 endometrioid adenocarcinoma with squamous differentiation involving the mucosa only.  From SGO Guidelines:  Recommended surveillance after treatment of endometrial cancer includes a follow-up visit every 3-6 months for 2 years, then every 6 months for 3 years, and annually thereafter. Each follow-up visit should include a thorough patient history; elicitation and investigation of any new symptoms associated with recurrence, such as vaginal bleeding, pelvic pain, weight loss, or lethargy; and a thorough speculum, pelvic, and rectovaginal examination   HTN (hypertension)    Hypothyroid    UTI (urinary tract infection)    Past Surgical History:  Procedure Laterality Date   ABDOMINAL HYSTERECTOMY     BREAST BIOPSY     benign cyst 2011   CESAREAN SECTION     INCISION AND DRAINAGE ABSCESS Right 08/16/2021   Procedure: INCISION AND DRAINAGE ABDOMINAL WALL ABSCESS;  Surgeon: Ronny Bacon, MD;  Location: [facility] ORS;  Service: General;  Laterality: Right;   LAPAROSCOPIC TOTAL HYSTERECTOMY  11/2012   Social History   Tobacco Use   Smoking status: Former    Types: Cigarettes    Quit date: 07/22/1979    Years since quitting: 42.1   Smokeless tobacco: Never   Tobacco comments:    briefly smoked at 22  Substance Use Topics   Alcohol use: No    Alcohol/week: 0.0 standard drinks    Comment: rarely   Drug use: No   Family History  Problem Relation Age of Onset   Hypothyroidism Mother    Alzheimer's disease Mother    Stroke Father    Deep vein thrombosis Sister    Colon cancer Neg Hx     Esophageal cancer Neg Hx    Stomach cancer Neg Hx    Rectal cancer Neg Hx    No Known Allergies Current Outpatient Medications on File Prior to Visit  Medication Sig Dispense Refill   amoxicillin-clavulanate (AUGMENTIN) 875-125 MG tablet Take 1 tablet by mouth 2 (two) times daily for 5 days. 10 tablet 0   atorvastatin (LIPITOR) 10 MG tablet Take 1 tablet (10 mg total) by mouth daily. In evening with a low fat snack 90 tablet 3   glimepiride (AMARYL) 4 MG tablet TAKE 1 TABLET BY MOUTH DAILY BEFORE BREAKFAST. 90 tablet 1   levothyroxine (SYNTHROID) 125 MCG tablet TAKE 1 TABLET BY MOUTH EVERY DAY 90 tablet 1   losartan (COZAAR) 100 MG tablet Take 0.5 tablets (50 mg total) by mouth daily. 45 tablet 3   metFORMIN (GLUCOPHAGE) 1000 MG  tablet Take 1 tablet (1,000 mg total) by mouth 2 (two) times daily with a meal. 180 tablet 3   No current facility-administered medications on file prior to visit.    Review of Systems  Constitutional:  Positive for fatigue. Negative for activity change, appetite change, fever and unexpected weight change.  HENT:  Negative for congestion, ear pain, rhinorrhea, sinus pressure and sore throat.   Eyes:  Negative for pain, redness and visual disturbance.  Respiratory:  Negative for cough, shortness of breath and wheezing.   Cardiovascular:  Negative for chest pain and palpitations.  Gastrointestinal:  Negative for abdominal pain, blood in stool, constipation and diarrhea.  Endocrine: Negative for polydipsia and polyuria.  Genitourinary:  Negative for dysuria, frequency and urgency.  Musculoskeletal:  Negative for arthralgias, back pain and myalgias.  Skin:  Negative for pallor and rash.       Redness and swelling around wound is much improved   Allergic/Immunologic: Negative for environmental allergies.  Neurological:  Negative for dizziness, syncope and headaches.  Hematological:  Negative for adenopathy. Does not bruise/bleed easily.  Psychiatric/Behavioral:   Negative for decreased concentration and dysphoric mood. The patient is not nervous/anxious.       Objective:   Physical Exam Constitutional:      General: She is not in acute distress.    Appearance: Normal appearance. She is well-developed. She is obese. She is not ill-appearing or diaphoretic.  HENT:     Head: Normocephalic and atraumatic.  Eyes:     General: No scleral icterus.    Conjunctiva/sclera: Conjunctivae normal.     Pupils: Pupils are equal, round, and reactive to light.  Neck:     Thyroid: No thyromegaly.     Vascular: No carotid bruit or JVD.  Cardiovascular:     Rate and Rhythm: Regular rhythm. Tachycardia present.     Heart sounds: Normal heart sounds.    No gallop.  Pulmonary:     Effort: Pulmonary effort is normal. No respiratory distress.     Breath sounds: Normal breath sounds. No wheezing or rales.  Abdominal:     General: Bowel sounds are normal. There is no distension or abdominal bruit.     Palpations: Abdomen is soft. There is no mass.     Tenderness: There is no abdominal tenderness. There is no guarding or rebound.  Musculoskeletal:     Cervical back: Normal range of motion and neck supple.     Right lower leg: No edema.     Left lower leg: No edema.  Lymphadenopathy:     Cervical: No cervical adenopathy.  Skin:    General: Skin is warm and dry.     Coloration: Skin is not pale.     Findings: No rash.     Comments: Wound on lower abdomen is packed with gauze and no active drainage (some dried blood on gauze in R corner) No swelling  Minimal to no redness No drainage  Neurological:     Mental Status: She is alert.     Coordination: Coordination normal.     Deep Tendon Reflexes: Reflexes are normal and symmetric. Reflexes normal.  Psychiatric:        Mood and Affect: Mood normal.          Assessment & Plan:   Problem List Items Addressed This Visit       Endocrine   DM type 2 (diabetes mellitus, type 2) ([place])    Planning f/u next  mo  Lab Results  Component Value Date   HGBA1C 8.3 (H) 08/17/2021   This was in setting of sepsis, suspect glucose is better at home  Some wt loss Plan to continue amaryl 4 mg daily and metformin 1000 mg bid          Other   Abscess of abdominal wall - Primary    Recent hospitalization for this in the setting of cellulitis (low abdomen) and sepsis Reviewed hospital records, lab results and studies in detail  Much clinical improvement/reassuring exam  Pt has been able to dress/pack her wound  Planning f/u with gen surgeon on 09/03/21 but will call earlier if needed  F/u planned here in mid feb for health mt and can re check wound then as well      Sepsis ([place])    Recent hospitalization for cellulitis and abd abscess  Much improvement with abx and I and D from gen surgeon  Continues augmentin after tx with vanc and unasyn Reassuring exam  Encouraged to finish antibiotics and drink fluids inst to call if no further imp

## 2021-08-20 NOTE — Assessment & Plan Note (Signed)
Planning f/u next mo  Lab Results  Component Value Date   HGBA1C 8.3 (H) 08/17/2021   This was in setting of sepsis, suspect glucose is better at home  Some wt loss Plan to continue amaryl 4 mg daily and metformin 1000 mg bid

## 2021-08-20 NOTE — Patient Instructions (Signed)
Keep your wound area clean  Continue dressing as instructed - get help with that from your sister in law   Watch for increased pain /redness or swelling or if the drainage changes   If you get a fever or any new symptoms let us know   Follow up with surgeon on 2/14 as planned   We will see you next month

## 2021-08-20 NOTE — Assessment & Plan Note (Signed)
Recent hospitalization for cellulitis and abd abscess  Much improvement with abx and I and D from gen surgeon  Continues augmentin after tx with vanc and unasyn Reassuring exam  Encouraged to finish antibiotics and drink fluids inst to call if no further imp

## 2021-08-21 LAB — CULTURE, BLOOD (ROUTINE X 2)
Culture: NO GROWTH
Culture: NO GROWTH
Special Requests: ADEQUATE

## 2021-08-22 ENCOUNTER — Ambulatory Visit: Payer: Self-pay | Admitting: Podiatry

## 2021-08-22 LAB — AEROBIC/ANAEROBIC CULTURE W GRAM STAIN (SURGICAL/DEEP WOUND)

## 2021-09-03 ENCOUNTER — Encounter: Payer: Self-pay | Admitting: Surgery

## 2021-09-03 ENCOUNTER — Other Ambulatory Visit: Payer: Self-pay

## 2021-09-03 ENCOUNTER — Ambulatory Visit (INDEPENDENT_AMBULATORY_CARE_PROVIDER_SITE_OTHER): Payer: Self-pay | Admitting: Surgery

## 2021-09-03 VITALS — BP 138/78 | HR 88 | Temp 98.3°F | Ht 62.0 in | Wt 210.0 lb

## 2021-09-03 DIAGNOSIS — L02211 Cutaneous abscess of abdominal wall: Secondary | ICD-10-CM

## 2021-09-03 NOTE — Progress Notes (Signed)
[facility] POST-OP OFFICE VISIT  09/03/2021  HPI: Taylor Grimes is a 61 y.o. female 17 days s/p excisional debridement/I&D for necrotizing abscess of the right lower quadrant abdominal wall.  She has been getting good home care with essentially twice daily dressing changes.  She has her sister assist her in the evenings and is getting excellent intention.  Vital signs: BP 138/78    Pulse 88    Temp 98.3 F (36.8 C)    Ht 5\' 2"  (1.575 m)    Wt 210 lb (95.3 kg)    LMP 07/21/2008    SpO2 97%    BMI 38.41 kg/m    Physical Exam: Constitutional: Overall she appears well, nontoxic, no pain or tenderness. Abdomen: Soft and nontender. Skin: Right lower quadrant skin fold there is a 99% granulated wound that runs from medial to lateral, its less than a centimeter and a half wide and the widest areas there is 1 focus of depth that had discolored grayish tissue and it, this was sharply debrided with a curette completing an excisional debridement of skin and subcutaneous tissues.  This was well-tolerated and we arrived at all viable tissue.  This was less than a centimeter in diameter.  She had some proud flesh which was also sharply debrided.  Good healthy bloody ooze with beefy red tissue throughout.  Adjacent skin is nonmacerated.  Assessment/Plan: This is a 61 y.o. female 17 days s/p excisional debridement of right lower quadrant necrotizing abscess.  Patient Active Problem List   Diagnosis Date Noted   Abscess of abdominal wall 08/16/2021   Sepsis ([place]) 08/16/2021   Uncontrolled type 2 diabetes mellitus with hyperglycemia ([place]) 08/16/2021   Hyperlipidemia associated with type 2 diabetes mellitus ([place]) 08/17/2018   Hypertrophic toenail 02/02/2018   Anxiety disorder 09/12/2015   Low HDL (under 40) 10/24/2014   Routine general medical examination at a health care facility 03/08/2014   History of endometrial cancer 12/15/2012   Colon cancer screening 11/01/2012   DM type 2  (diabetes mellitus, type 2) ([place]) 06/30/2012   Varicose veins of leg with swelling, left 11/18/2011   Obesity, Class III, BMI 40-49.9 (morbid obesity) ([place]) 11/12/2010   Essential hypertension 07/12/2009   Hypothyroidism (acquired) 05/16/2009    -We will continue doing her wound care with damp to dry gauze considering that her skin fold keeps it excessively moist.  We will have her follow-up in 2 weeks or as needed.   Ronny Bacon M.D., FACS 09/03/2021, 10:46 AM

## 2021-09-03 NOTE — Patient Instructions (Signed)
Continue to pack the area with damp wed gauze. Cover with dry gauze and then the ABD pad.  Try keeping the skin folds as dry as possible.   Follow up here in 2 weeks.

## 2021-09-05 ENCOUNTER — Telehealth: Payer: Self-pay | Admitting: Family Medicine

## 2021-09-05 DIAGNOSIS — E119 Type 2 diabetes mellitus without complications: Secondary | ICD-10-CM

## 2021-09-05 DIAGNOSIS — E785 Hyperlipidemia, unspecified: Secondary | ICD-10-CM

## 2021-09-05 DIAGNOSIS — E1165 Type 2 diabetes mellitus with hyperglycemia: Secondary | ICD-10-CM

## 2021-09-05 DIAGNOSIS — I10 Essential (primary) hypertension: Secondary | ICD-10-CM

## 2021-09-05 DIAGNOSIS — E1169 Type 2 diabetes mellitus with other specified complication: Secondary | ICD-10-CM

## 2021-09-05 DIAGNOSIS — E039 Hypothyroidism, unspecified: Secondary | ICD-10-CM

## 2021-09-05 NOTE — Telephone Encounter (Signed)
-----   Message from Ellamae Sia sent at 08/22/2021  3:50 PM EST ----- Regarding: Lab orders for Friday, 2.17.23 Patient is scheduled for CPX labs, please order future labs, Thanks , Karna Christmas

## 2021-09-06 ENCOUNTER — Other Ambulatory Visit (INDEPENDENT_AMBULATORY_CARE_PROVIDER_SITE_OTHER): Payer: 59

## 2021-09-06 ENCOUNTER — Other Ambulatory Visit: Payer: Self-pay

## 2021-09-06 DIAGNOSIS — I10 Essential (primary) hypertension: Secondary | ICD-10-CM | POA: Diagnosis not present

## 2021-09-06 DIAGNOSIS — E785 Hyperlipidemia, unspecified: Secondary | ICD-10-CM

## 2021-09-06 DIAGNOSIS — E1169 Type 2 diabetes mellitus with other specified complication: Secondary | ICD-10-CM | POA: Diagnosis not present

## 2021-09-06 DIAGNOSIS — E039 Hypothyroidism, unspecified: Secondary | ICD-10-CM | POA: Diagnosis not present

## 2021-09-06 LAB — CBC WITH DIFFERENTIAL/PLATELET
Basophils Absolute: 0 10*3/uL (ref 0.0–0.1)
Basophils Relative: 0.6 % (ref 0.0–3.0)
Eosinophils Absolute: 0.3 10*3/uL (ref 0.0–0.7)
Eosinophils Relative: 7 % — ABNORMAL HIGH (ref 0.0–5.0)
HCT: 37.3 % (ref 36.0–46.0)
Hemoglobin: 12.5 g/dL (ref 12.0–15.0)
Lymphocytes Relative: 45 % (ref 12.0–46.0)
Lymphs Abs: 2 10*3/uL (ref 0.7–4.0)
MCHC: 33.5 g/dL (ref 30.0–36.0)
MCV: 91.5 fl (ref 78.0–100.0)
Monocytes Absolute: 0.4 10*3/uL (ref 0.1–1.0)
Monocytes Relative: 8.7 % (ref 3.0–12.0)
Neutro Abs: 1.7 10*3/uL (ref 1.4–7.7)
Neutrophils Relative %: 38.7 % — ABNORMAL LOW (ref 43.0–77.0)
Platelets: 159 10*3/uL (ref 150.0–400.0)
RBC: 4.08 Mil/uL (ref 3.87–5.11)
RDW: 14 % (ref 11.5–15.5)
WBC: 4.5 10*3/uL (ref 4.0–10.5)

## 2021-09-06 LAB — COMPREHENSIVE METABOLIC PANEL
ALT: 30 U/L (ref 0–35)
AST: 38 U/L — ABNORMAL HIGH (ref 0–37)
Albumin: 3.7 g/dL (ref 3.5–5.2)
Alkaline Phosphatase: 94 U/L (ref 39–117)
BUN: 10 mg/dL (ref 6–23)
CO2: 31 mEq/L (ref 19–32)
Calcium: 9.1 mg/dL (ref 8.4–10.5)
Chloride: 106 mEq/L (ref 96–112)
Creatinine, Ser: 0.81 mg/dL (ref 0.40–1.20)
GFR: 77.7 mL/min (ref >60.00)
Glucose, Bld: 120 mg/dL — ABNORMAL HIGH (ref 70–99)
Potassium: 3.7 mEq/L (ref 3.5–5.1)
Sodium: 143 mEq/L (ref 135–145)
Total Bilirubin: 1.2 mg/dL (ref 0.2–1.2)
Total Protein: 6.5 g/dL (ref 6.0–8.3)

## 2021-09-06 LAB — LIPID PANEL
Cholesterol: 163 mg/dL (ref 0–200)
HDL: 38.3 mg/dL — ABNORMAL LOW (ref >39.00)
LDL Cholesterol: 90 mg/dL (ref 0–99)
NonHDL: 125.12
Total CHOL/HDL Ratio: 4
Triglycerides: 174 mg/dL — ABNORMAL HIGH (ref 0.0–149.0)
VLDL: 34.8 mg/dL (ref 0.0–40.0)

## 2021-09-06 LAB — TSH: TSH: 0.08 u[IU]/mL — ABNORMAL LOW (ref 0.35–5.50)

## 2021-09-13 ENCOUNTER — Encounter: Payer: 59 | Admitting: Family Medicine

## 2021-09-17 ENCOUNTER — Encounter: Payer: Self-pay | Admitting: Surgery

## 2021-09-17 ENCOUNTER — Ambulatory Visit (INDEPENDENT_AMBULATORY_CARE_PROVIDER_SITE_OTHER): Payer: 59 | Admitting: Surgery

## 2021-09-17 ENCOUNTER — Other Ambulatory Visit: Payer: Self-pay

## 2021-09-17 VITALS — BP 148/90 | HR 93 | Temp 98.2°F | Ht 61.0 in | Wt 208.0 lb

## 2021-09-17 DIAGNOSIS — L929 Granulomatous disorder of the skin and subcutaneous tissue, unspecified: Secondary | ICD-10-CM

## 2021-09-17 NOTE — Progress Notes (Signed)
Wound check:  She has been changing her dressing twice daily with the assistance of her son.  He is still utilizing saline. On evaluation her wound is linear, about 8 cm in length but 5 mm 3 mm in width.  Hyper granulated, with some proud flesh.  Silver nitrate was applied. We advised changing to cleansing with hydrogen peroxide and utilizing a dry dressing.  I believe because this is in a skin fold it will likely continue to hypergranulate.  Hopefully keeping it dry will help the epidermis to finish healing this wound.  We will see her back in 2 weeks.

## 2021-09-17 NOTE — Patient Instructions (Signed)
Continue to wash with soap and water and place a dry dressing over the area and secure with tape.  Please see your follow up appointment listed below.

## 2021-09-18 ENCOUNTER — Other Ambulatory Visit: Payer: Self-pay | Admitting: Family Medicine

## 2021-09-19 ENCOUNTER — Ambulatory Visit: Payer: 59 | Admitting: Podiatry

## 2021-09-19 ENCOUNTER — Encounter: Payer: Self-pay | Admitting: Podiatry

## 2021-09-19 ENCOUNTER — Other Ambulatory Visit: Payer: Self-pay

## 2021-09-19 DIAGNOSIS — B351 Tinea unguium: Secondary | ICD-10-CM | POA: Diagnosis not present

## 2021-09-19 DIAGNOSIS — M79675 Pain in left toe(s): Secondary | ICD-10-CM | POA: Diagnosis not present

## 2021-09-19 DIAGNOSIS — E119 Type 2 diabetes mellitus without complications: Secondary | ICD-10-CM

## 2021-09-19 DIAGNOSIS — M79674 Pain in right toe(s): Secondary | ICD-10-CM

## 2021-09-19 NOTE — Progress Notes (Signed)
Complaint:  Visit Type: Patient returns to my office for continued preventative foot care services. Complaint: Patient states" my nails have grown long and thick and become painful to walk and wear shoes" Patient has been diagnosed with DM with no foot complications. The patient presents for preventative foot care services. No changes to ROS  Podiatric Exam: Vascular: dorsalis pedis and posterior tibial pulses are palpable bilateral. Capillary return is immediate. Temperature gradient is WNL. Skin turgor WNL  Sensorium: Normal Semmes Weinstein monofilament test. Normal tactile sensation bilaterally. Nail Exam: Pt has thick disfigured discolored nails with subungual debris noted bilateral entire nail hallux through fifth toenails Ulcer Exam: There is no evidence of ulcer or pre-ulcerative changes or infection. Orthopedic Exam: Muscle tone and strength are WNL. No limitations in general ROM. No crepitus or effusions noted. Foot type and digits show no abnormalities. Bony prominences are unremarkable. Skin:  Porokeratosis asymptomatic.. No infection or ulcers.    Diagnosis:  Onychomycosis, , Pain in right toe, pain in left toes.   Treatment & Plan Procedures and Treatment: Consent by patient was obtained for treatment procedures.   Debridement of mycotic and hypertrophic toenails, 1 through 5 bilateral and clearing of subungual debris. With nail nipper and dremel tool. No ulceration, no infection noted.  Return Visit-Office Procedure: Patient instructed to return to the office for a follow up visit 3 months for continued evaluation and treatment.    Rivka Baune DPM 

## 2021-10-01 ENCOUNTER — Encounter: Payer: Self-pay | Admitting: Surgery

## 2021-10-01 ENCOUNTER — Other Ambulatory Visit: Payer: Self-pay

## 2021-10-01 ENCOUNTER — Ambulatory Visit (INDEPENDENT_AMBULATORY_CARE_PROVIDER_SITE_OTHER): Payer: 59 | Admitting: Surgery

## 2021-10-01 VITALS — BP 159/89 | HR 91 | Temp 98.2°F | Ht 61.0 in | Wt 210.0 lb

## 2021-10-01 DIAGNOSIS — L929 Granulomatous disorder of the skin and subcutaneous tissue, unspecified: Secondary | ICD-10-CM | POA: Diagnosis not present

## 2021-10-01 NOTE — Progress Notes (Signed)
Taylor Grimes returns today for follow-up of her right lower quadrant abdominal wall incision wound.  Her wound is significantly smaller, but because its in the fold she continues to have some proud flesh.  This was sharply excised with a curette, and the hypergranulation tissue was then treated with silver nitrate.  She tolerated this well.  And will continue cleansing with hydrogen peroxide, and covering with a dry dressing.  We will see her back in 2 weeks. ?

## 2021-10-01 NOTE — Patient Instructions (Signed)
Please continue to wash the area with soap and water. Dab peroxide on the area as well to help dry the area.  ? ?Please see your follow up appointment listed below. ?

## 2021-10-04 ENCOUNTER — Encounter: Payer: Self-pay | Admitting: Family Medicine

## 2021-10-04 ENCOUNTER — Other Ambulatory Visit: Payer: Self-pay

## 2021-10-04 ENCOUNTER — Ambulatory Visit (INDEPENDENT_AMBULATORY_CARE_PROVIDER_SITE_OTHER): Payer: 59 | Admitting: Family Medicine

## 2021-10-04 VITALS — BP 124/90 | HR 75 | Temp 98.2°F | Ht 62.0 in | Wt 210.4 lb

## 2021-10-04 DIAGNOSIS — E785 Hyperlipidemia, unspecified: Secondary | ICD-10-CM

## 2021-10-04 DIAGNOSIS — Z0000 Encounter for general adult medical examination without abnormal findings: Secondary | ICD-10-CM

## 2021-10-04 DIAGNOSIS — Z6838 Body mass index (BMI) 38.0-38.9, adult: Secondary | ICD-10-CM

## 2021-10-04 DIAGNOSIS — E1169 Type 2 diabetes mellitus with other specified complication: Secondary | ICD-10-CM

## 2021-10-04 DIAGNOSIS — E119 Type 2 diabetes mellitus without complications: Secondary | ICD-10-CM

## 2021-10-04 DIAGNOSIS — Z1211 Encounter for screening for malignant neoplasm of colon: Secondary | ICD-10-CM

## 2021-10-04 DIAGNOSIS — E039 Hypothyroidism, unspecified: Secondary | ICD-10-CM

## 2021-10-04 DIAGNOSIS — Z1231 Encounter for screening mammogram for malignant neoplasm of breast: Secondary | ICD-10-CM

## 2021-10-04 DIAGNOSIS — I10 Essential (primary) hypertension: Secondary | ICD-10-CM | POA: Diagnosis not present

## 2021-10-04 MED ORDER — LOSARTAN POTASSIUM 100 MG PO TABS: 50.0000 mg | ORAL_TABLET | Freq: Every day | ORAL | 3 refills | Status: DC

## 2021-10-04 MED ORDER — ATORVASTATIN CALCIUM 10 MG PO TABS: 10.0000 mg | ORAL_TABLET | Freq: Every day | ORAL | 3 refills | Status: DC

## 2021-10-04 MED ORDER — GLIMEPIRIDE 4 MG PO TABS: 4.0000 mg | ORAL_TABLET | Freq: Every day | ORAL | 3 refills | Status: DC

## 2021-10-04 MED ORDER — METFORMIN HCL 1000 MG PO TABS: 1000.0000 mg | ORAL_TABLET | Freq: Two times a day (BID) | ORAL | 3 refills | Status: DC

## 2021-10-04 NOTE — Assessment & Plan Note (Signed)
Last A1c was elevated to 8.3 after an episode of sepsis ?Next A1c is due in May ?Diet is improved and patient is kept weight off ?Plan to continue Amaryl 4 mg daily and metformin 1000 mg twice daily ?Low glycemic diet is reviewed ?Eye exam is up-to-date and foot care is good ?

## 2021-10-04 NOTE — Assessment & Plan Note (Signed)
No changes in breast exam ?Mammogram was ordered at the [facility] breast center ?Patient will call to schedule ?

## 2021-10-04 NOTE — Assessment & Plan Note (Signed)
bp in fair control at this time  ?BP Readings from Last 1 Encounters:  ?10/04/21 124/90  ? ?No changes needed ?Most recent labs reviewed  ?Disc lifstyle change with low sodium diet and exercise  ?Plan to continue losartan 50 mg daily ?

## 2021-10-04 NOTE — Progress Notes (Signed)
? ?Subjective:  ? ? Patient ID: Taylor Grimes, female    DOB: Sep 24, 1960, 61 y.o.   MRN: [id number] ? ?This visit occurred during the SARS-CoV-2 public health emergency.  Safety protocols were in place, including screening questions prior to the visit, additional usage of staff PPE, and extensive cleaning of exam room while observing appropriate contact time as indicated for disinfecting solutions.  ? ?HPI ?Here for health maintenance exam and to review chronic medical problems   ? ? ?Wt Readings from Last 3 Encounters:  ?10/04/21 210 lb 6.4 oz (95.4 kg)  ?10/01/21 210 lb (95.3 kg)  ?09/17/21 208 lb (94.3 kg)  ? ?38.48 kg/m? ? ?Feeling ok  ?Gradually getting her strength back /endurance is slow to return  ?Takes breaks  ? ? ?Mammogram  05/2020 ?Ordered at [facility]-she will call to schedule her  ?Self breast exam: no lumps  ? ?Gyn care  ?H/o endom cancer in past -no longer gets paps  ? ?Colonoscopy 12/2013 with 5 y recall  ?Not ready to schedule  ?Wants to do ifob instead  ? ? ?Covid immunized  ?Td 2021 ?Flu shot 05/2021 ?Zostavax 11/2020 ? ? ?Abd wound : doing well  ?Continue to heal  ?Taking care of herself  ?Son is helping out along with her sister in law  ? ? ? ?HTN ?bp is stable today  ?No cp or palpitations or headaches or edema  ?No side effects to medicines  ?BP Readings from Last 3 Encounters:  ?10/04/21 124/90  ?10/01/21 (!) 159/89  ?09/17/21 (!) 148/90  ?  Losartan 50 mg daily  ? ?Lab Results  ?Component Value Date  ? CREATININE 0.81 09/06/2021  ? BUN 10 09/06/2021  ? NA 143 09/06/2021  ? K 3.7 09/06/2021  ? CL 106 09/06/2021  ? CO2 31 09/06/2021  ? ? ?DM2  ?Lab Results  ?Component Value Date  ? HGBA1C 8.3 (H) 08/17/2021  ? ?This was up after bout of infection with sepsis ? ?Amaryl 4 mg daily  ?Metformin 1000 mg bid  ?Eating well  ?Avoids sugar ?Less carbs  ?Portions remain smaller/has not gained her weight back and proud of that  ?Increasing her activity gradually  ? ?Blood glucose is a little over 100 in am   ? ?Hyperlipidemia ?Lab Results  ?Component Value Date  ? CHOL 163 09/06/2021  ? CHOL 168 12/03/2020  ? CHOL 168 09/07/2020  ? ?Lab Results  ?Component Value Date  ? HDL 38.30 (L) 09/06/2021  ? HDL 36.90 (L) 12/03/2020  ? HDL 39.70 09/07/2020  ? ?Lab Results  ?Component Value Date  ? [place] 90 09/06/2021  ? [place] 99 12/03/2020  ? [place] 99 09/07/2020  ? ?Lab Results  ?Component Value Date  ? TRIG 174.0 (H) 09/06/2021  ? TRIG 160.0 (H) 12/03/2020  ? TRIG 147.0 09/07/2020  ? ?Lab Results  ?Component Value Date  ? CHOLHDL 4 09/06/2021  ? CHOLHDL 5 12/03/2020  ? CHOLHDL 4 09/07/2020  ? ?Lab Results  ?Component Value Date  ? LDLDIRECT 111.0 08/19/2019  ? LDLDIRECT 113.0 06/17/2017  ? ?Atorvastatin 10 mg daily  ? ?Hypothyroidism  ?Pt has no clinical changes ?No change in energy level/ hair or skin/ edema and no tremor ?Lab Results  ?Component Value Date  ? TSH 0.08 (L) 09/06/2021  ?   ?Sees Dr Cruzita Lederer  ?Levothyroxine 125 mcg daily ? ?Patient Active Problem List  ? Diagnosis Date Noted  ? Encounter for screening mammogram for breast cancer 10/04/2021  ? Hypergranulation 09/17/2021  ?  Abscess of abdominal wall 08/16/2021  ? Uncontrolled type 2 diabetes mellitus with hyperglycemia ([place]) 08/16/2021  ? Hyperlipidemia associated with type 2 diabetes mellitus ([place]) 08/17/2018  ? Hypertrophic toenail 02/02/2018  ? Anxiety disorder 09/12/2015  ? Low HDL (under 40) 10/24/2014  ? Routine general medical examination at a health care facility 03/08/2014  ? History of endometrial cancer 12/15/2012  ? Colon cancer screening 11/01/2012  ? DM type 2 (diabetes mellitus, type 2) ([place]) 06/30/2012  ? Varicose veins of leg with swelling, left 11/18/2011  ? Class 2 obesity due to excess calories with body mass index (BMI) of 38.0 to 38.9 in adult 11/12/2010  ? Essential hypertension 07/12/2009  ? Hypothyroidism (acquired) 05/16/2009  ? ?Past Medical History:  ?Diagnosis Date  ? Anxiety   ? Breast lesion   ? benign  ? Diabetes mellitus  without complication ([place])   ? Elevated transaminase level   ? ? fatty liver   ? History of endometrial cancer 12/15/2012  ? FIGO Grade 2 endometrioid adenocarcinoma with squamous differentiation involving the mucosa only.  From SGO Guidelines:  Recommended surveillance after treatment of endometrial cancer includes a follow-up visit every 3-6 months for 2 years, then every 6 months for 3 years, and annually thereafter. Each follow-up visit should include a thorough patient history; elicitation and investigation of any new symptoms associated with recurrence, such as vaginal bleeding, pelvic pain, weight loss, or lethargy; and a thorough speculum, pelvic, and rectovaginal examination  ? HTN (hypertension)   ? Hypothyroid   ? UTI (urinary tract infection)   ? ?Past Surgical History:  ?Procedure Laterality Date  ? ABDOMINAL HYSTERECTOMY    ? BREAST BIOPSY    ? benign cyst 2011  ? CESAREAN SECTION    ? INCISION AND DRAINAGE ABSCESS Right 08/16/2021  ? Procedure: INCISION AND DRAINAGE ABDOMINAL WALL ABSCESS;  Surgeon: Ronny Bacon, MD;  Location: [facility] OR[facility];  Service: General;  Laterality: Right;  ? LAPAROSCOPIC TOTAL HYSTERECTOMY  11/2012  ? ?Social History  ? ?Tobacco Use  ? Smoking status: Former  ?  Types: Cigarettes  ?  Quit date: 07/22/1979  ?  Years since quitting: 42.2  ?  Passive exposure: Never  ? Smokeless tobacco: Never  ? Tobacco comments:  ?  briefly smoked at 16  ?Vaping Use  ? Vaping Use: Never used  ?Substance Use Topics  ? Alcohol use: No  ?  Alcohol/week: 0.0 standard drinks  ?  Comment: rarely  ? Drug use: No  ? ?Family History  ?Problem Relation Age of Onset  ? Hypothyroidism Mother   ? Alzheimer's disease Mother   ? Stroke Father   ? Deep vein thrombosis Sister   ? Colon cancer Neg Hx   ? Esophageal cancer Neg Hx   ? Stomach cancer Neg Hx   ? Rectal cancer Neg Hx   ? ?No Known Allergies ?Current Outpatient Medications on File Prior to Visit  ?Medication Sig Dispense Refill  ? levothyroxine (SYNTHROID)  125 MCG tablet TAKE 1 TABLET BY MOUTH EVERY DAY 90 tablet 1  ? ?No current facility-administered medications on file prior to visit.  ?  ? ?Review of Systems  ?Constitutional:  Negative for activity change, appetite change, fatigue, fever and unexpected weight change.  ?HENT:  Negative for congestion, ear pain, rhinorrhea, sinus pressure and sore throat.   ?Eyes:  Negative for pain, redness and visual disturbance.  ?Respiratory:  Negative for cough, shortness of breath and wheezing.   ?Cardiovascular:  Negative for chest  pain and palpitations.  ?Gastrointestinal:  Negative for abdominal pain, blood in stool, constipation and diarrhea.  ?Endocrine: Negative for polydipsia and polyuria.  ?Genitourinary:  Negative for dysuria, frequency and urgency.  ?Musculoskeletal:  Negative for arthralgias, back pain and myalgias.  ?Skin:  Positive for wound. Negative for pallor and rash.  ?     Abdominal wound much improved   ?Allergic/Immunologic: Negative for environmental allergies.  ?Neurological:  Negative for dizziness, syncope and headaches.  ?Hematological:  Negative for adenopathy. Does not bruise/bleed easily.  ?Psychiatric/Behavioral:  Negative for decreased concentration and dysphoric mood. The patient is not nervous/anxious.   ? ?   ?Objective:  ? Physical Exam ?Constitutional:   ?   General: She is not in acute distress. ?   Appearance: Normal appearance. She is well-developed. She is obese. She is not ill-appearing or diaphoretic.  ?HENT:  ?   Head: Normocephalic and atraumatic.  ?   Right Ear: Tympanic membrane, ear canal and external ear normal.  ?   Left Ear: Tympanic membrane, ear canal and external ear normal.  ?   Nose: Nose normal. No congestion.  ?   Mouth/Throat:  ?   Mouth: Mucous membranes are moist.  ?   Pharynx: Oropharynx is clear. No posterior oropharyngeal erythema.  ?Eyes:  ?   General: No scleral icterus. ?   Extraocular Movements: Extraocular movements intact.  ?   Conjunctiva/sclera: Conjunctivae  normal.  ?   Pupils: Pupils are equal, round, and reactive to light.  ?Neck:  ?   Thyroid: No thyromegaly.  ?   Vascular: No carotid bruit or JVD.  ?Cardiovascular:  ?   Rate and Rhythm: Normal rate and reg

## 2021-10-04 NOTE — Assessment & Plan Note (Signed)
Disc goals for lipids and reasons to control them ?Rev last labs with pt ?Rev low sat fat diet in detail ?Fair control with LDL of 90 on atorvastatin 10 mg daily ?

## 2021-10-04 NOTE — Patient Instructions (Addendum)
I'm glad you are doing better  ? ?Check your glucose at some different times later in the day  ? ?Schedule labs for early May  ? ?Take care of yourself  ? ?Do the stool kit (ifob) for colon cancer screening, until you are ready to do a colonsocopy  ? ?Call and schedule your mammogram at [facility] breast center ? ?Please call the location of your choice from the menu below to schedule your Mammogram and/or Bone Density appointment.   ? ?[place]  ? ?[facility]                ?      Phone:  [phone] ?[address]                               ?[place], [place] [address]                                                             ?Services: Traditional and 3D Mammogram, Bone Density  ? ?[facility]           ?      Phone: [phone] ?[address]                                                       ?[place], [place] [address]    ?Service: Bone Density ONLY  ? *this site does NOT perform mammograms ? ?[place]                       ? Phone:  [phone] ?[address]                                  ?[place], [place] [address]                                            ?Services:  3D Mammogram and Bone Density  ? ? ?[place] ? ?[place] [facility]   ?Phone:  [phone]   ?[place],  [address]                                            ?Services: 3D Mammogram and Bone Density ? ?  [place] at [facility])  ?Phone:  [phone]   ?[address]                        ?[place], [place] [address]                                              ?Services:  3D Mammogram and Bone Density ? ?

## 2021-10-04 NOTE — Assessment & Plan Note (Signed)
Under the care of endocrinology, Dr. Cruzita Lederer [facility] G[facility] ?TSH was low on last check at 0.08 ?Patient is now taking levothyroxine 125 mcg daily with no clinical changes ?TSH will be checked at next labs ?

## 2021-10-04 NOTE — Assessment & Plan Note (Signed)
Discussed how this problem influences overall health and the risks it imposes  ?Reviewed plan for weight loss with lower calorie diet (via better food choices and also portion control or program like weight watchers) and exercise building up to or more than 30 minutes 5 days per week including some aerobic activity  ? ?Has kept weight off ?Commended ? ?

## 2021-10-04 NOTE — Assessment & Plan Note (Signed)
Last colonoscopy was in June 2015 with a 5-year recall ?Patient declines currently ?Given I fob kit  ?

## 2021-10-04 NOTE — Assessment & Plan Note (Signed)
Reviewed health habits including diet and exercise and skin cancer prevention ?Reviewed appropriate screening tests for age  ?Also reviewed health mt list, fam hx and immunization status , as well as social and family history   ?See HPI ?Labs reviewed  ?Mammogram ordered at the [facility] breast center, patient will call to schedule ?Colonoscopy is due but patient declines, given I fob kit ?First Zostavax was May 2020 22-second 1 pending ? ?

## 2021-10-15 ENCOUNTER — Other Ambulatory Visit: Payer: Self-pay

## 2021-10-15 ENCOUNTER — Ambulatory Visit (INDEPENDENT_AMBULATORY_CARE_PROVIDER_SITE_OTHER): Payer: 59 | Admitting: Surgery

## 2021-10-15 ENCOUNTER — Encounter: Payer: Self-pay | Admitting: Surgery

## 2021-10-15 ENCOUNTER — Other Ambulatory Visit: Payer: Self-pay | Admitting: Surgery

## 2021-10-15 VITALS — BP 163/98 | HR 96 | Temp 98.3°F | Ht 61.0 in | Wt 207.4 lb

## 2021-10-15 DIAGNOSIS — B372 Candidiasis of skin and nail: Secondary | ICD-10-CM

## 2021-10-15 DIAGNOSIS — L929 Granulomatous disorder of the skin and subcutaneous tissue, unspecified: Secondary | ICD-10-CM | POA: Diagnosis not present

## 2021-10-15 MED ORDER — NYSTATIN 100000 UNIT/GM EX POWD: 1.0000 "application " | Freq: Three times a day (TID) | CUTANEOUS | 0 refills | Status: DC

## 2021-10-15 NOTE — Progress Notes (Signed)
[facility] ?POST-OP OFFICE VISIT ? ?10/15/2021 ? ?HPI: ?Taylor Grimes is a 61 y.o. female 2 months s/p incision and drainage of a right lower quadrant abdominal wall abscess.  She continues to have a lingering hypergranulation tissue at the site, about a 1 cm diameter oval.  This is within a skin fold, and immediately there is some moisture and evidence of yeast infiltration.  She denies any pain.  She reports she has been utilizing hydrogen peroxide to wash with.  She has been utilizing gauze to try to keep the area dry, she admits she does not have to use tape at times because the skin fold keeps the gauze in place. ? ?Vital signs: ?LMP 07/21/2008   ? ?Physical Exam: ?Constitutional: She appears well. ?Abdomen: Small foci of hypergranulation tissue right lower quadrant within skin fold. ?Skin: Evidence of pink superficial cellulitic change consistent with a yeast odor. ? ?Assessment/Plan: ?This is a 61 y.o. female 2 months s/p incision and drainage.  Persisting hypergranulation tissue.  Now with yeast in skin folds. ? ?Patient Active Problem List  ? Diagnosis Date Noted  ? Encounter for screening mammogram for breast cancer 10/04/2021  ? Hypergranulation 09/17/2021  ? Abscess of abdominal wall 08/16/2021  ? Uncontrolled type 2 diabetes mellitus with hyperglycemia ([place]) 08/16/2021  ? Hyperlipidemia associated with type 2 diabetes mellitus ([place]) 08/17/2018  ? Hypertrophic toenail 02/02/2018  ? Anxiety disorder 09/12/2015  ? Low HDL (under 40) 10/24/2014  ? Routine general medical examination at a health care facility 03/08/2014  ? History of endometrial cancer 12/15/2012  ? Colon cancer screening 11/01/2012  ? DM type 2 (diabetes mellitus, type 2) ([place]) 06/30/2012  ? Varicose veins of leg with swelling, left 11/18/2011  ? Class 2 obesity due to excess calories with body mass index (BMI) of 38.0 to 38.9 in adult 11/12/2010  ? Essential hypertension 07/12/2009  ? Hypothyroidism (acquired) 36/14/4315   ? ? -Application of silver nitrate to the hypergranulation focus.  Continue cleansing, allow air to the area.  Keep clean and dry. ?To the area of yeast, the same keep it clean and dry but applying nystatin powder 3 times daily.  We will have her return for follow-up in 2 weeks. ? ? ?Ronny Bacon M.D., FACS ?10/15/2021, 9:15 AM  ?

## 2021-10-15 NOTE — Patient Instructions (Addendum)
You may get Nystatin Powder over the counter to help with moistness and rash.  Apply 2-3 times daily. ? ? ?If you have any concerns or questions, please feel free to call our office. See follow up appointment below.  ? ? ?Skin Yeast Infection ? ?A skin yeast infection is a condition in which there is an overgrowth of yeast (Candida) that normally lives on the skin. This condition usually occurs in areas of the skin that are constantly warm and moist, such as the skin under the breasts or armpits, or in the groin and other body folds. ?What are the causes? ?This condition is caused by a change in the normal balance of the yeast that live on the skin. ?What increases the risk? ?You are more likely to develop this condition if you: ?Are obese. ?Are pregnant. ?Are 46 years of age or older. ?Wear tight clothing. ?Have any of the following conditions: ?Diabetes. ?Malnutrition. ?A weak body defense system (immune system). ?Take medicines such as: ?Birth control pills. ?Antibiotics. ?Steroid medicines. ?What are the signs or symptoms? ?The most common symptom of this condition is itchiness in the affected area. Other symptoms include: ?A red, swollen area of the skin. ?Bumps on the skin. ?How is this diagnosed? ?This condition is diagnosed with a medical history and physical exam. Your health care provider may check for yeast by taking scrapings of the skin to be viewed under a microscope. ?How is this treated? ?This condition is treated with medicine. Medicines may be prescribed or available over the counter. The medicines may be: ?Taken by mouth (orally). ?Applied as a cream or powder to your skin. ?Follow these instructions at home: ? ?Take or apply over-the-counter and prescription medicines only as told by your health care provider. ?Maintain a healthy weight. If you need help losing weight, talk with your health care provider. ?Keep your skin clean and dry. ?Wear loose-fitting clothing. ?If you have diabetes, keep  your blood sugar under control. ?Keep all follow-up visits. This is important. ?Contact a health care provider if: ?Your symptoms go away and then come back. ?Your symptoms do not get better with treatment. ?Your symptoms get worse. ?Your rash spreads. ?You have a fever or chills. ?You have new symptoms. ?You have new warmth or redness of your skin. ?Your rash is painful or bleeding. ?Summary ?A skin yeast infection is a condition in which there is an overgrowth of yeast (Candida) that normally lives on the skin. ?Take or apply over-the-counter and prescription medicines only as told by your health care provider. ?Keep your skin clean and dry. ?Contact a health care provider if your symptoms do not get better with treatment. ?This information is not intended to replace advice given to you by your health care provider. Make sure you discuss any questions you have with your health care provider. ?Document Revised: 09/25/2020 Document Reviewed: 09/25/2020 ?Elsevier Patient Education ? Taylor Grimes. ? ?

## 2021-10-22 ENCOUNTER — Other Ambulatory Visit (INDEPENDENT_AMBULATORY_CARE_PROVIDER_SITE_OTHER): Payer: 59

## 2021-10-22 DIAGNOSIS — Z1211 Encounter for screening for malignant neoplasm of colon: Secondary | ICD-10-CM

## 2021-10-23 ENCOUNTER — Telehealth: Payer: Self-pay | Admitting: Radiology

## 2021-10-23 DIAGNOSIS — R195 Other fecal abnormalities: Secondary | ICD-10-CM | POA: Insufficient documentation

## 2021-10-23 LAB — FECAL OCCULT BLOOD, IMMUNOCHEMICAL: Fecal Occult Bld: POSITIVE — AB

## 2021-10-23 NOTE — Telephone Encounter (Signed)
The referral is in  ?Please let me know when she gets the appt  ?Thanks ?

## 2021-10-23 NOTE — Telephone Encounter (Signed)
[facility] called a POSITIVE ifob result. Results given to Dr Glori Bickers. ?

## 2021-10-23 NOTE — Telephone Encounter (Signed)
I want to refer for colonoscopy for a positive screening test  ?This could indicate polyps  ?Is that ok? Does she prefer [facility] or [facility]? ?

## 2021-10-23 NOTE — Telephone Encounter (Signed)
Pt advised of ifob and Dr. Marliss Coots comments. Pt does want to proceed with colonoscopy she would like to see someone in [place], I advise pt if she hasn't received a phone call in 2 weeks regarding this to let us know. ?

## 2021-10-23 NOTE — Telephone Encounter (Signed)
Left VM requesting pt to call the office back 

## 2021-10-23 NOTE — Telephone Encounter (Signed)
I want to refer for colonoscopy ?

## 2021-10-24 ENCOUNTER — Telehealth: Payer: Self-pay

## 2021-10-24 NOTE — Telephone Encounter (Signed)
CALLED PATIENT NO ANSWER LEFT VOICEMAIL FOR A CALL BACK ?Hx of polyps and new patient  any ?

## 2021-10-24 NOTE — Telephone Encounter (Signed)
[facility] office has reached out to the patient and patient has called back, they are trying to get in touch with patient again. Will keep this note in here for now until scheduled ?

## 2021-10-24 NOTE — Telephone Encounter (Signed)
Patient returned call, requesting call back.

## 2021-10-29 ENCOUNTER — Ambulatory Visit (INDEPENDENT_AMBULATORY_CARE_PROVIDER_SITE_OTHER): Payer: 59 | Admitting: Surgery

## 2021-10-29 ENCOUNTER — Telehealth: Payer: Self-pay

## 2021-10-29 ENCOUNTER — Encounter: Payer: Self-pay | Admitting: Surgery

## 2021-10-29 VITALS — BP 143/85 | HR 83 | Temp 98.6°F | Ht 61.0 in | Wt 207.0 lb

## 2021-10-29 DIAGNOSIS — B372 Candidiasis of skin and nail: Secondary | ICD-10-CM

## 2021-10-29 NOTE — Progress Notes (Signed)
Prior wound has healed, there is no evidence of residual hypergranulation.  She has done well with her topical nystatin. ?She will follow-up as needed. ?

## 2021-10-29 NOTE — Patient Instructions (Signed)
Continue to keep the area dry. You may keep using the Nystatin powder. ? ?Follow-up with our office as needed. ? ?Please call and ask to speak with a nurse if you develop questions or concerns. ? ?

## 2021-10-29 NOTE — Telephone Encounter (Signed)
CALLED PATIENT NO ANSWER LEFT VOICEMAIL FOR A CALL BACK ? ?

## 2021-10-30 NOTE — Telephone Encounter (Signed)
Patient returned your call. Requesting a call back.

## 2021-10-31 NOTE — Telephone Encounter (Signed)
[facility] has tried to call pt 3x with no response. ? ?Called pt and no answer so Left VM requesting pt to call the office back to f/u on this. ? ?FYI to PCP  ?

## 2021-10-31 NOTE — Telephone Encounter (Signed)
Thanks for calling her, can send a letter if she does not respond ?

## 2021-11-01 ENCOUNTER — Telehealth: Payer: Self-pay

## 2021-11-01 NOTE — Telephone Encounter (Signed)
CALLED PATIENT NO ANSWER LEFT VOICEMAIL FOR A CALL BACK ? ?

## 2021-11-01 NOTE — Telephone Encounter (Signed)
Patient returned your call, requesting call back.  ?

## 2021-11-04 NOTE — Telephone Encounter (Signed)
Pt calling back, she is wanting to schedule colonoscopy ?

## 2021-11-05 ENCOUNTER — Telehealth: Payer: Self-pay

## 2021-11-05 ENCOUNTER — Other Ambulatory Visit: Payer: Self-pay

## 2021-11-05 DIAGNOSIS — Z86010 Personal history of colonic polyps: Secondary | ICD-10-CM

## 2021-11-05 DIAGNOSIS — R195 Other fecal abnormalities: Secondary | ICD-10-CM

## 2021-11-05 MED ORDER — NA SULFATE-K SULFATE-MG SULF 17.5-3.13-1.6 GM/177ML PO SOLN: 354.0000 mL | Freq: Once | ORAL | 0 refills | Status: AC

## 2021-11-05 NOTE — Telephone Encounter (Signed)
CALLED PATIENT NO ANSWER LEFT VOICEMAIL FOR A CALL BACK ? ?

## 2021-11-05 NOTE — Telephone Encounter (Signed)
Gastroenterology Pre-Procedure Review ? ?Request Date: 11/20/2021 ?Requesting Physician: Dr. Marius Ditch ? ?PATIENT REVIEW QUESTIONS: The patient responded to the following health history questions as indicated:   ? ?1. Are you having any GI issues? no ?2. Do you have a personal history of Polyps? Yes 5 years ago ?3. Do you have a family history of Colon Cancer or Polyps? Sister colon polyps  ?4. Diabetes Mellitus? Yes Type 2 ?5. Joint replacements in the past 12 months?no ?6. Major health problems in the past 3 months?no ?7. Any artificial heart valves, MVP, or defibrillator?no ?   ?MEDICATIONS & ALLERGIES:    ?Patient reports the following regarding taking any anticoagulation/antiplatelet therapy:   ?Plavix, Coumadin, Eliquis, Xarelto, Lovenox, Pradaxa, Brilinta, or Effient? no ?Aspirin? no ? ?Patient confirms/reports the following medications:  ?Current Outpatient Medications  ?Medication Sig Dispense Refill  ? atorvastatin (LIPITOR) 10 MG tablet Take 1 tablet (10 mg total) by mouth daily. In evening with a low fat snack 90 tablet 3  ? glimepiride (AMARYL) 4 MG tablet Take 1 tablet (4 mg total) by mouth daily before breakfast. 90 tablet 3  ? levothyroxine (SYNTHROID) 125 MCG tablet TAKE 1 TABLET BY MOUTH EVERY DAY 90 tablet 1  ? losartan (COZAAR) 100 MG tablet Take 0.5 tablets (50 mg total) by mouth daily. 45 tablet 3  ? metFORMIN (GLUCOPHAGE) 1000 MG tablet Take 1 tablet (1,000 mg total) by mouth 2 (two) times daily with a meal. 180 tablet 3  ? nystatin powder Apply 1 application. topically 3 (three) times daily. 15 g 0  ? ?No current facility-administered medications for this visit.  ? ? ?Patient confirms/reports the following allergies:  ?No Known Allergies ? ?No orders of the defined types were placed in this encounter. ? ? ?AUTHORIZATION INFORMATION ?Primary Insurance: ?1D#: ?Group #: ? ?Secondary Insurance: ?1D#: ?Group #: ? ?SCHEDULE INFORMATION: ?Date:  ?Time: ?Location: ? ?

## 2021-11-05 NOTE — Telephone Encounter (Signed)
GI was able to get in touch with her and appt has been made  ?

## 2021-11-14 LAB — HM DIABETES EYE EXAM

## 2021-11-19 ENCOUNTER — Other Ambulatory Visit: Payer: Self-pay | Admitting: Surgery

## 2021-11-19 ENCOUNTER — Encounter: Payer: Self-pay | Admitting: Gastroenterology

## 2021-11-20 ENCOUNTER — Ambulatory Visit
Admission: RE | Admit: 2021-11-20 | Discharge: 2021-11-20 | Disposition: A | Discharge status: Home / Self Care | Payer: 59 | Attending: Gastroenterology | Admitting: Gastroenterology

## 2021-11-20 ENCOUNTER — Telehealth: Payer: Self-pay

## 2021-11-20 ENCOUNTER — Ambulatory Visit: Payer: 59 | Admitting: Anesthesiology

## 2021-11-20 ENCOUNTER — Other Ambulatory Visit: Payer: Self-pay

## 2021-11-20 ENCOUNTER — Encounter
Admission: RE | Disposition: A | Discharge status: Home / Self Care | Payer: Self-pay | Source: Home / Self Care | Attending: Gastroenterology

## 2021-11-20 ENCOUNTER — Encounter: Payer: Self-pay | Admitting: Gastroenterology

## 2021-11-20 DIAGNOSIS — K5730 Diverticulosis of large intestine without perforation or abscess without bleeding: Secondary | ICD-10-CM | POA: Insufficient documentation

## 2021-11-20 DIAGNOSIS — D120 Benign neoplasm of cecum: Secondary | ICD-10-CM | POA: Diagnosis not present

## 2021-11-20 DIAGNOSIS — Z87891 Personal history of nicotine dependence: Secondary | ICD-10-CM | POA: Diagnosis not present

## 2021-11-20 DIAGNOSIS — Z6838 Body mass index (BMI) 38.0-38.9, adult: Secondary | ICD-10-CM | POA: Insufficient documentation

## 2021-11-20 DIAGNOSIS — Z860100 Personal history of colon polyps, unspecified: Secondary | ICD-10-CM

## 2021-11-20 DIAGNOSIS — K648 Other hemorrhoids: Secondary | ICD-10-CM | POA: Insufficient documentation

## 2021-11-20 DIAGNOSIS — K635 Polyp of colon: Secondary | ICD-10-CM

## 2021-11-20 DIAGNOSIS — R195 Other fecal abnormalities: Secondary | ICD-10-CM | POA: Diagnosis not present

## 2021-11-20 DIAGNOSIS — I10 Essential (primary) hypertension: Secondary | ICD-10-CM | POA: Insufficient documentation

## 2021-11-20 DIAGNOSIS — Z1211 Encounter for screening for malignant neoplasm of colon: Secondary | ICD-10-CM | POA: Diagnosis not present

## 2021-11-20 DIAGNOSIS — K644 Residual hemorrhoidal skin tags: Secondary | ICD-10-CM | POA: Diagnosis not present

## 2021-11-20 DIAGNOSIS — E039 Hypothyroidism, unspecified: Secondary | ICD-10-CM | POA: Insufficient documentation

## 2021-11-20 DIAGNOSIS — E119 Type 2 diabetes mellitus without complications: Secondary | ICD-10-CM | POA: Diagnosis not present

## 2021-11-20 HISTORY — PX: COLONOSCOPY WITH PROPOFOL: SHX5780

## 2021-11-20 LAB — GLUCOSE, CAPILLARY: Glucose-Capillary: 142 mg/dL — ABNORMAL HIGH (ref 70–99)

## 2021-11-20 SURGERY — COLONOSCOPY WITH PROPOFOL
Anesthesia: General

## 2021-11-20 MED ORDER — SODIUM CHLORIDE 0.9 % IV SOLN: INTRAVENOUS | Status: DC

## 2021-11-20 MED ORDER — PROPOFOL 10 MG/ML IV BOLUS
INTRAVENOUS | Status: DC | PRN
  Administered 2021-11-20 (×2): 20 mg via INTRAVENOUS

## 2021-11-20 MED ORDER — PROPOFOL 500 MG/50ML IV EMUL
INTRAVENOUS | Status: DC | PRN
  Administered 2021-11-20: 155 ug/kg/min via INTRAVENOUS

## 2021-11-20 MED ORDER — LIDOCAINE HCL (PF) 1 % IJ SOLN
INTRAMUSCULAR | Status: AC
  Administered 2021-11-20: 0.3 mg
  Filled 2021-11-20: qty 2

## 2021-11-20 MED ORDER — LIDOCAINE HCL (CARDIAC) PF 100 MG/5ML IV SOSY
PREFILLED_SYRINGE | INTRAVENOUS | Status: DC | PRN
  Administered 2021-11-20: 80 mg via INTRAVENOUS
  Administered 2021-11-20: 20 mg via INTRAVENOUS

## 2021-11-20 MED ORDER — GLYCOPYRROLATE 0.2 MG/ML IJ SOLN
INTRAMUSCULAR | Status: DC | PRN
  Administered 2021-11-20: .2 mg via INTRAVENOUS

## 2021-11-20 NOTE — Telephone Encounter (Signed)
Patient called requesting a call back from [place]. No further information was given.  ?

## 2021-11-20 NOTE — Telephone Encounter (Signed)
-----   Message from Lin Landsman, MD sent at 11/20/2021 10:56 AM EDT ----- ?Regarding: Banding ?Please schedule hemorrhoid banding, new pt ?Ok to [place] ?Did colonoscopy today ? ?RV ? ?

## 2021-11-20 NOTE — Op Note (Signed)
[facility] ?Gastroenterology ?Patient Name: Taylor Grimes ?Procedure Date: 11/20/2021 9:59 AM ?MRN: [id number] ?Account #: [id number] ?Date of Birth: [birth year] ?Admit Type: Outpatient ?Age: 61 ?Room: [facility] ENDO ROOM 4 ?Gender: Female ?Note Status: Finalized ?Instrument Name: Colonoscope [id number] ?Procedure:             Colonoscopy ?Indications:           Positive fecal immunochemical test ?Providers:             Lin Landsman MD, MD ?Referring MD:          Wynelle Fanny. Tower (Referring MD) ?Medicines:             General Anesthesia ?Complications:         No immediate complications. Estimated blood loss: None. ?Procedure:             Pre-Anesthesia Assessment: ?                       - Prior to the procedure, a History and Physical was  ?                       performed, and patient medications and allergies were  ?                       reviewed. The patient is competent. The risks and  ?                       benefits of the procedure and the sedation options and  ?                       risks were discussed with the patient. All questions  ?                       were answered and informed consent was obtained.  ?                       Patient identification and proposed procedure were  ?                       verified by the physician, the nurse, the  ?                       anesthesiologist, the anesthetist and the technician  ?                       in the pre-procedure area in the procedure room in the  ?                       endoscopy suite. Mental Status Examination: alert and  ?                       oriented. Airway Examination: normal oropharyngeal  ?                       airway and neck mobility. Respiratory Examination:  ?                       clear to auscultation. CV Examination: normal.  ?  Prophylactic Antibiotics: The patient does not require  ?                       prophylactic antibiotics. Prior Anticoagulants: The  ?                       patient has  taken no previous anticoagulant or  ?                       antiplatelet agents. ASA Grade Assessment: III - A  ?                       patient with severe systemic disease. After reviewing  ?                       the risks and benefits, the patient was deemed in  ?                       satisfactory condition to undergo the procedure. The  ?                       anesthesia plan was to use general anesthesia.  ?                       Immediately prior to administration of medications,  ?                       the patient was re-assessed for adequacy to receive  ?                       sedatives. The heart rate, respiratory rate, oxygen  ?                       saturations, blood pressure, adequacy of pulmonary  ?                       ventilation, and response to care were monitored  ?                       throughout the procedure. The physical status of the  ?                       patient was re-assessed after the procedure. ?                       After obtaining informed consent, the colonoscope was  ?                       passed under direct vision. Throughout the procedure,  ?                       the patient's blood pressure, pulse, and oxygen  ?                       saturations were monitored continuously. The  ?                       Colonoscope was introduced through the anus and  ?  advanced to the the cecum, identified by appendiceal  ?                       orifice and ileocecal valve. The colonoscopy was  ?                       performed without difficulty. The patient tolerated  ?                       the procedure well. The quality of the bowel  ?                       preparation was good. ?Findings: ?     The perianal and digital rectal examinations were normal. Pertinent  ?     negatives include normal sphincter tone and no palpable rectal lesions. ?     A 4 mm polyp was found in the cecum. The polyp was sessile. The polyp  ?     was removed with a cold snare.  Resection and retrieval were complete.  ?     Estimated blood loss: none. ?     Multiple diverticula were found in the recto-sigmoid colon and sigmoid  ?     colon. ?     External and internal hemorrhoids were found during retroflexion. The  ?     hemorrhoids were medium-sized. ?Impression:            - One 4 mm polyp in the cecum, removed with a cold  ?                       snare. Resected and retrieved. ?                       - Diverticulosis in the recto-sigmoid colon and in the  ?                       sigmoid colon. ?                       - External and internal hemorrhoids. ?Recommendation:        - Discharge patient to home (with escort). ?                       - Resume previous diet today. ?                       - Continue present medications. ?                       - Await pathology results. ?                       - Repeat colonoscopy in 7-10 years for surveillance  ?                       based on pathology results. ?                       - Follow up in my clinic for hemorrhoid banding ?Procedure Code(s):     --- Professional --- ?  45385, Colonoscopy, flexible; with removal of  ?                       tumor(s), polyp(s), or other lesion(s) by snare  ?                       technique ?Diagnosis Code(s):     --- Professional --- ?                       K63.5, Polyp of colon ?                       K64.8, Other hemorrhoids ?                       R19.5, Other fecal abnormalities ?                       K57.30, Diverticulosis of large intestine without  ?                       perforation or abscess without bleeding ?CPT copyright 2019 American Medical Association. All rights reserved. ?The codes documented in this report are preliminary and upon coder review may  ?be revised to meet current compliance requirements. ?Dr. Ulyess Mort ?Jonmarc Bodkin Raeanne Gathers MD, MD ?11/20/2021 10:43:01 AM ?This report has been signed electronically. ?Number of Addenda: 0 ?Note Initiated On: 11/20/2021 9:59  AM ?Scope Withdrawal Time: 0 hours 11 minutes 31 seconds  ?Total Procedure Duration: 0 hours 16 minutes 14 seconds  ?Estimated Blood Loss:  Estimated blood loss: none. ?     [facility] ?

## 2021-11-20 NOTE — Anesthesia Postprocedure Evaluation (Signed)
Anesthesia Post Note ? ?Patient: Taylor Grimes ? ?Procedure(s) Performed: COLONOSCOPY WITH PROPOFOL ? ?Patient location during evaluation: PACU ?Anesthesia Type: General ?Level of consciousness: awake and oriented ?Pain management: pain level controlled ?Vital Signs Assessment: post-procedure vital signs reviewed and stable ?Respiratory status: spontaneous breathing and respiratory function stable ?Anesthetic complications: no ? ? ?No notable events documented. ? ? ?Last Vitals:  ?Vitals:  ? 11/20/21 1043 11/20/21 1053  ?BP: 115/70 124/77  ?Pulse:    ?Resp:    ?Temp: (!) 35.8 ?C   ?SpO2:    ?  ?Last Pain:  ?Vitals:  ? 11/20/21 1103  ?TempSrc:   ?PainSc: 0-No pain  ? ? ?  ?  ?  ?  ?  ?  ? ?VAN STAVEREN,Ludwig Tugwell ? ? ? ? ?

## 2021-11-20 NOTE — Telephone Encounter (Signed)
Made appointment for patient on 12/03/21 ?

## 2021-11-20 NOTE — Anesthesia Preprocedure Evaluation (Signed)
Anesthesia Evaluation  ?Patient identified by MRN, date of birth, ID band ?Patient awake ? ? ? ?Reviewed: ?Allergy & Precautions, NPO status , Patient's Chart, lab work & pertinent test results ? ?Airway ?Mallampati: II ? ?TM Distance: >3 FB ?Neck ROM: Full ? ? ? Dental ? ?(+) Teeth Intact ?  ?Pulmonary ?neg pulmonary ROS, former smoker,  ?  ?Pulmonary exam normal ? ?+ decreased breath sounds ? ? ? ? ? Cardiovascular ?Exercise Tolerance: Good ?hypertension, Pt. on medications ?negative cardio ROS ?Normal cardiovascular exam ?Rhythm:Regular Rate:Normal ? ? ?  ?Neuro/Psych ?Anxiety negative neurological ROS ? negative psych ROS  ? GI/Hepatic ?negative GI ROS, Neg liver ROS,   ?Endo/Other  ?negative endocrine ROSdiabetes, Well Controlled, Type 2Hypothyroidism Morbid obesity ? Renal/GU ?negative Renal ROS  ?negative genitourinary ?  ?Musculoskeletal ?negative musculoskeletal ROS ?(+)  ? Abdominal ?(+) + obese,   ?Peds ?negative pediatric ROS ?(+)  Hematology ?negative hematology ROS ?(+)   ?Anesthesia Other Findings ?Past Medical History: ?No date: Anxiety ?No date: Breast lesion ?    Comment:  benign ?No date: Diabetes mellitus without complication ([place]) ?No date: Elevated transaminase level ?    Comment:  ? fatty liver  ?12/15/2012: History of endometrial cancer ?    Comment:  FIGO Grade 2 endometrioid adenocarcinoma with squamous  ?             differentiation involving the mucosa only.  From SGO  ?             Guidelines:  Recommended surveillance after treatment of  ?             endometrial cancer includes a follow-up visit every 3-6  ?             months for 2 years, then every 6 months for 3 years, and  ?             annually thereafter. Each follow-up visit should include  ?             a thorough patient history; elicitation and investigation ?             of any new symptoms associated with recurrence, such as  ?             vaginal bleeding, pelvic pain, weight loss, or  lethargy;  ?             and a thorough speculum, pelvic, and rectovaginal  ?             examination ?No date: HTN (hypertension) ?No date: Hypothyroid ?No date: UTI (urinary tract infection) ? ?Past Surgical History: ?No date: ABDOMINAL HYSTERECTOMY ?No date: BREAST BIOPSY ?    Comment:  benign cyst 2011 ?No date: CESAREAN SECTION ?08/16/2021: INCISION AND DRAINAGE ABSCESS; Right ?    Comment:  Procedure: INCISION AND DRAINAGE ABDOMINAL WALL ABSCESS; ?             Surgeon: Ronny Bacon, MD;  Location: [facility] ORS;   ?             Service: General;  Laterality: Right; ?11/2012: LAPAROSCOPIC TOTAL HYSTERECTOMY ? ?BMI   ? Body Mass Index: 38.73 kg/m?  ?  ? ? Reproductive/Obstetrics ?negative OB ROS ? ?  ? ? ? ? ? ? ? ? ? ? ? ? ? ?  ?  ? ? ? ? ? ? ? ? ?Anesthesia Physical ?Anesthesia Plan ? ?ASA: 3 ? ?Anesthesia Plan: General  ? ?Post-op  Pain Management:   ? ?Induction: Intravenous ? ?PONV Risk Score and Plan: Propofol infusion and TIVA ? ?Airway Management Planned: Natural Airway and Nasal Cannula ? ?Additional Equipment:  ? ?Intra-op Plan:  ? ?Post-operative Plan:  ? ?Informed Consent: I have reviewed the patients History and Physical, chart, labs and discussed the procedure including the risks, benefits and alternatives for the proposed anesthesia with the patient or authorized representative who has indicated his/her understanding and acceptance.  ? ? ? ?Dental Advisory Given ? ?Plan Discussed with: CRNA and Surgeon ? ?Anesthesia Plan Comments:   ? ? ? ? ? ? ?Anesthesia Quick Evaluation ? ?

## 2021-11-20 NOTE — H&P (Signed)
?Taylor Darby, MD ?[address]  ?[address]  ?[place], [place] [address]  ?Main: [phone]  ?Fax: [phone] ?Pager: [phone] ? ?Primary Care Physician:  Tower, Wynelle Fanny, MD ?Primary Gastroenterologist:  Dr. Cephas Grimes ? ?Pre-Procedure History & Physical: ?HPI:  Taylor Grimes is a 61 y.o. female is here for an colonoscopy. ?  ?Past Medical History:  ?Diagnosis Date  ? Anxiety   ? Breast lesion   ? benign  ? Diabetes mellitus without complication ([place])   ? Elevated transaminase level   ? ? fatty liver   ? History of endometrial cancer 12/15/2012  ? FIGO Grade 2 endometrioid adenocarcinoma with squamous differentiation involving the mucosa only.  From SGO Guidelines:  Recommended surveillance after treatment of endometrial cancer includes a follow-up visit every 3-6 months for 2 years, then every 6 months for 3 years, and annually thereafter. Each follow-up visit should include a thorough patient history; elicitation and investigation of any new symptoms associated with recurrence, such as vaginal bleeding, pelvic pain, weight loss, or lethargy; and a thorough speculum, pelvic, and rectovaginal examination  ? HTN (hypertension)   ? Hypothyroid   ? UTI (urinary tract infection)   ? ? ?Past Surgical History:  ?Procedure Laterality Date  ? ABDOMINAL HYSTERECTOMY    ? BREAST BIOPSY    ? benign cyst 2011  ? CESAREAN SECTION    ? INCISION AND DRAINAGE ABSCESS Right 08/16/2021  ? Procedure: INCISION AND DRAINAGE ABDOMINAL WALL ABSCESS;  Surgeon: Ronny Bacon, MD;  Location: [facility] ORS;  Service: General;  Laterality: Right;  ? LAPAROSCOPIC TOTAL HYSTERECTOMY  11/2012  ? ? ?Prior to Admission medications   ?Medication Sig Start Date End Date Taking? Authorizing Provider  ?atorvastatin (LIPITOR) 10 MG tablet Take 1 tablet (10 mg total) by mouth daily. In evening with a low fat snack 10/04/21  Yes Tower, Wynelle Fanny, MD  ?glimepiride (AMARYL) 4 MG tablet Take 1 tablet (4 mg total) by mouth daily before breakfast.  10/04/21  Yes Tower, Wynelle Fanny, MD  ?levothyroxine (SYNTHROID) 125 MCG tablet TAKE 1 TABLET BY MOUTH EVERY DAY 05/20/21  Yes Philemon Kingdom, MD  ?losartan (COZAAR) 100 MG tablet Take 0.5 tablets (50 mg total) by mouth daily. 10/04/21  Yes Tower, Wynelle Fanny, MD  ?metFORMIN (GLUCOPHAGE) 1000 MG tablet Take 1 tablet (1,000 mg total) by mouth 2 (two) times daily with a meal. 10/04/21  Yes Tower, Wynelle Fanny, MD  ?nystatin (MYCOSTATIN/NYSTOP) powder APPLY 1 APPLICATION. TOPICALLY 3 (THREE) TIMES DAILY. 11/19/21   Ronny Bacon, MD  ? ? ?Allergies as of 11/05/2021  ? (No Known Allergies)  ? ? ?Family History  ?Problem Relation Age of Onset  ? Hypothyroidism Mother   ? Alzheimer's disease Mother   ? Stroke Father   ? Deep vein thrombosis Sister   ? Colon cancer Neg Hx   ? Esophageal cancer Neg Hx   ? Stomach cancer Neg Hx   ? Rectal cancer Neg Hx   ? ? ?Social History  ? ?Socioeconomic History  ? Marital status: Legally Separated  ?  Spouse name: Not on file  ? Number of children: 2  ? Years of education: Not on file  ? Highest education level: Not on file  ?Occupational History  ? Occupation: shift Freight forwarder   ?  Employer: [employer]  ?Tobacco Use  ? Smoking status: Former  ?  Types: Cigarettes  ?  Quit date: 07/22/1979  ?  Years since quitting: 42.3  ?  Passive exposure:  Never  ? Smokeless tobacco: Never  ? Tobacco comments:  ?  briefly smoked at 16  ?Vaping Use  ? Vaping Use: Never used  ?Substance and Sexual Activity  ? Alcohol use: No  ?  Alcohol/week: 0.0 standard drinks  ?  Comment: rarely  ? Drug use: No  ? Sexual activity: Yes  ?  Birth control/protection: Post-menopausal  ?Other Topics Concern  ? Not on file  ?Social History Narrative  ? Not on file  ? ?Social Determinants of Health  ? ?Financial Resource Strain: Not on file  ?Food Insecurity: Not on file  ?Transportation Needs: Not on file  ?Physical Activity: Not on file  ?Stress: Not on file  ?Social Connections: Not on file  ?Intimate Partner Violence: Not on  file  ? ? ?Review of Systems: ?See HPI, otherwise negative ROS ? ?Physical Exam: ?BP 135/79   Pulse 78   Temp (!) 96.2 ?F (35.7 ?C) (Temporal)   Resp 18   Ht '5\' 1"'$  (1.549 m)   Wt 93 kg   LMP 07/21/2008   SpO2 100%   BMI 38.73 kg/m?  ?General:   Alert,  pleasant and cooperative in NAD ?Head:  Normocephalic and atraumatic. ?Neck:  Supple; no masses or thyromegaly. ?Lungs:  Clear throughout to auscultation.    ?Heart:  Regular rate and rhythm. ?Abdomen:  Soft, nontender and nondistended. Normal bowel sounds, without guarding, and without rebound.   ?Neurologic:  Alert and  oriented x4;  grossly normal neurologically. ? ?Impression/Plan: ?Taylor Grimes is here for an colonoscopy to be performed for FOBT positive ? ?Risks, benefits, limitations, and alternatives regarding  colonoscopy have been reviewed with the patient.  Questions have been answered.  All parties agreeable. ? ? ?Sherri Sear, MD  11/20/2021, 9:42 AM ?

## 2021-11-20 NOTE — Anesthesia Procedure Notes (Signed)
Procedure Name: General with mask airway ?Date/Time: 11/20/2021 10:26 AM ?Performed by: Kelton Pillar, CRNA ?Pre-anesthesia Checklist: Patient identified, Emergency Drugs available, Suction available and Patient being monitored ?Patient Re-evaluated:Patient Re-evaluated prior to induction ?Oxygen Delivery Method: Simple face mask ?Induction Type: IV induction ?Placement Confirmation: positive ETCO2, CO2 detector and breath sounds checked- equal and bilateral ?Dental Injury: Teeth and Oropharynx as per pre-operative assessment  ? ? ? ? ?

## 2021-11-20 NOTE — Transfer of Care (Signed)
Immediate Anesthesia Transfer of Care Note ? ?Patient: AURORE REDINGER ? ?Procedure(s) Performed: COLONOSCOPY WITH PROPOFOL ? ?Patient Location: Endoscopy Unit ? ?Anesthesia Type:General ? ?Level of Consciousness: awake, drowsy and patient cooperative ? ?Airway & Oxygen Therapy: Patient Spontanous Breathing and Patient connected to face mask oxygen ? ?Post-op Assessment: Report given to RN and Post -op Vital signs reviewed and stable ? ?Post vital signs: Reviewed and stable ? ?Last Vitals:  ?Vitals Value Taken Time  ?BP 115/70 11/20/21 1043  ?Temp 35.8 ?C 11/20/21 1043  ?Pulse 98 11/20/21 1050  ?Resp 21 11/20/21 1050  ?SpO2 97 % 11/20/21 1050  ?Vitals shown include unvalidated device data. ? ?Last Pain:  ?Vitals:  ? 11/20/21 1043  ?TempSrc: Temporal  ?PainSc:   ?   ? ?  ? ?Complications: No notable events documented. ?

## 2021-11-20 NOTE — Telephone Encounter (Signed)
Called and left a message for call back  

## 2021-11-21 LAB — SURGICAL PATHOLOGY

## 2021-11-22 ENCOUNTER — Encounter: Payer: Self-pay | Admitting: Gastroenterology

## 2021-11-27 ENCOUNTER — Encounter: Payer: Self-pay | Admitting: Family Medicine

## 2021-12-03 ENCOUNTER — Encounter: Payer: Self-pay | Admitting: Gastroenterology

## 2021-12-03 ENCOUNTER — Ambulatory Visit: Payer: 59 | Admitting: Gastroenterology

## 2021-12-03 ENCOUNTER — Telehealth: Payer: Self-pay | Admitting: Family Medicine

## 2021-12-03 VITALS — BP 149/88 | HR 79 | Temp 98.5°F | Ht 61.0 in | Wt 210.2 lb

## 2021-12-03 DIAGNOSIS — E1165 Type 2 diabetes mellitus with hyperglycemia: Secondary | ICD-10-CM

## 2021-12-03 DIAGNOSIS — K640 First degree hemorrhoids: Secondary | ICD-10-CM

## 2021-12-03 DIAGNOSIS — L290 Pruritus ani: Secondary | ICD-10-CM | POA: Diagnosis not present

## 2021-12-03 DIAGNOSIS — I10 Essential (primary) hypertension: Secondary | ICD-10-CM

## 2021-12-03 DIAGNOSIS — R21 Rash and other nonspecific skin eruption: Secondary | ICD-10-CM

## 2021-12-03 MED ORDER — CLOTRIMAZOLE 1 % EX CREA: 1.0000 "application " | TOPICAL_CREAM | Freq: Two times a day (BID) | CUTANEOUS | 0 refills | Status: DC

## 2021-12-03 NOTE — Addendum Note (Signed)
Addended by: Sherri Sear R on: 12/03/2021 01:17 PM ? ? Modules accepted: Level of Service ? ?

## 2021-12-03 NOTE — Progress Notes (Signed)

## 2021-12-03 NOTE — Telephone Encounter (Signed)
-----   Message from Velna Hatchet, RT sent at 11/18/2021 10:19 AM EDT ----- ?Regarding: Lab Thu 12/05/21 ?Lab orders needed for appt on 12/05/21, please.  Thanks,  Anda Kraft ? ?

## 2021-12-03 NOTE — Progress Notes (Signed)
?  ?Cephas Darby, MD ?[address]  ?[address]  ?[place], [place] [address]  ?Main: [phone]  ?Fax: [phone] ? ? ? ?Gastroenterology Consultation ? ?Referring Provider:     Abner Greenspan, MD ?Primary Care Physician:  Tower, Wynelle Fanny, MD ?Primary Gastroenterologist:  Dr. Cephas Darby ?Reason for Consultation:     Symptomatic hemorrhoids ?      ? HPI:   ?Taylor Grimes is a 61 y.o. female referred by Dr. Glori Bickers, Wynelle Fanny, MD  for consultation & management of symptomatic hemorrhoids.  Patient reports that she has been experiencing intense perianal itching.  She denies any constipation or diarrhea.  She underwent colonoscopy recently, found to have large internal and external hemorrhoids.  Patient denies any other hemorrhoidal symptoms ? ?NSAIDs: None ? ?Antiplts/Anticoagulants/Anti thrombotics: None ? ?GI Procedures:  ?Colonoscopy 11/20/2021 ?- One 4 mm polyp in the cecum, removed with a cold snare. Resected and retrieved. ?- Diverticulosis in the recto-sigmoid colon and in the sigmoid colon. ?- External and internal hemorrhoids. ?DIAGNOSIS:  ?A. COLON POLYP, CECUM; COLD SNARE:  ?- TUBULAR ADENOMA.  ?- NEGATIVE FOR HIGH-GRADE DYSPLASIA AND MALIGNANCY.  ? ?Past Medical History:  ?Diagnosis Date  ? Anxiety   ? Breast lesion   ? benign  ? Diabetes mellitus without complication ([place])   ? Elevated transaminase level   ? ? fatty liver   ? History of endometrial cancer 12/15/2012  ? FIGO Grade 2 endometrioid adenocarcinoma with squamous differentiation involving the mucosa only.  From SGO Guidelines:  Recommended surveillance after treatment of endometrial cancer includes a follow-up visit every 3-6 months for 2 years, then every 6 months for 3 years, and annually thereafter. Each follow-up visit should include a thorough patient history; elicitation and investigation of any new symptoms associated with recurrence, such as vaginal bleeding, pelvic pain, weight loss, or lethargy; and a thorough speculum, pelvic, and  rectovaginal examination  ? HTN (hypertension)   ? Hypothyroid   ? UTI (urinary tract infection)   ? ? ?Past Surgical History:  ?Procedure Laterality Date  ? ABDOMINAL HYSTERECTOMY    ? BREAST BIOPSY    ? benign cyst 2011  ? CESAREAN SECTION    ? COLONOSCOPY WITH PROPOFOL N/A 11/20/2021  ? Procedure: COLONOSCOPY WITH PROPOFOL;  Surgeon: Lin Landsman, MD;  Location: [facility] ENDOSCOPY;  Service: Gastroenterology;  Laterality: N/A;  ? INCISION AND DRAINAGE ABSCESS Right 08/16/2021  ? Procedure: INCISION AND DRAINAGE ABDOMINAL WALL ABSCESS;  Surgeon: Ronny Bacon, MD;  Location: [facility] ORS;  Service: General;  Laterality: Right;  ? LAPAROSCOPIC TOTAL HYSTERECTOMY  11/2012  ? ?Current Outpatient Medications:  ?  atorvastatin (LIPITOR) 10 MG tablet, Take 1 tablet (10 mg total) by mouth daily. In evening with a low fat snack, Disp: 90 tablet, Rfl: 3 ?  clotrimazole (ANTIFUNGAL, CLOTRIMAZOLE,) 1 % cream, Apply 1 application. topically 2 (two) times daily., Disp: 30 g, Rfl: 0 ?  glimepiride (AMARYL) 4 MG tablet, Take 1 tablet (4 mg total) by mouth daily before breakfast., Disp: 90 tablet, Rfl: 3 ?  levothyroxine (SYNTHROID) 125 MCG tablet, TAKE 1 TABLET BY MOUTH EVERY DAY, Disp: 90 tablet, Rfl: 1 ?  losartan (COZAAR) 100 MG tablet, Take 0.5 tablets (50 mg total) by mouth daily., Disp: 45 tablet, Rfl: 3 ?  metFORMIN (GLUCOPHAGE) 1000 MG tablet, Take 1 tablet (1,000 mg total) by mouth 2 (two) times daily with a meal., Disp: 180 tablet, Rfl: 3 ?  nystatin (MYCOSTATIN/NYSTOP) powder, APPLY 1 APPLICATION.  TOPICALLY 3 (THREE) TIMES DAILY., Disp: 15 g, Rfl: 0 ? ?Family History  ?Problem Relation Age of Onset  ? Hypothyroidism Mother   ? Alzheimer's disease Mother   ? Stroke Father   ? Deep vein thrombosis Sister   ? Colon cancer Neg Hx   ? Esophageal cancer Neg Hx   ? Stomach cancer Neg Hx   ? Rectal cancer Neg Hx   ?  ? ?Social History  ? ?Tobacco Use  ? Smoking status: Former  ?  Types: Cigarettes  ?  Quit date: 07/22/1979  ?   Years since quitting: 42.3  ?  Passive exposure: Never  ? Smokeless tobacco: Never  ? Tobacco comments:  ?  briefly smoked at 16  ?Vaping Use  ? Vaping Use: Never used  ?Substance Use Topics  ? Alcohol use: No  ?  Alcohol/week: 0.0 standard drinks  ?  Comment: rarely  ? Drug use: No  ? ? ?Allergies as of 12/03/2021  ? (No Known Allergies)  ? ? ?Review of Systems:    ?All systems reviewed and negative except where noted in HPI. ? ? Physical Exam:  ?BP (!) 149/88 (BP Location: Left Arm, Patient Position: Sitting, Cuff Size: Large)   Pulse 79   Temp 98.5 ?F (36.9 ?C) (Oral)   Ht '5\' 1"'$  (1.549 m)   Wt 210 lb 4 oz (95.4 kg)   LMP 07/21/2008   BMI 39.73 kg/m?  ?Patient's last menstrual period was 07/21/2008. ? ?General:   Alert,  Well-developed, well-nourished, pleasant and cooperative in NAD ?Head:  Normocephalic and atraumatic. ?Eyes:  Sclera clear, no icterus.   Conjunctiva pink. ?Ears:  Normal auditory acuity. ?Nose:  No deformity, discharge, or lesions. ?Mouth:  No deformity or lesions,oropharynx pink & moist. ?Neck:  Supple; no masses or thyromegaly. ?Lungs:  Respirations even and unlabored.  Clear throughout to auscultation.   No wheezes, crackles, or rhonchi. No acute distress. ?Heart:  Regular rate and rhythm; no murmurs, clicks, rubs, or gallops. ?Abdomen:  Normal bowel sounds. Soft, non-tender and non-distended without masses, hepatosplenomegaly or hernias noted.  No guarding or rebound tenderness.   ?Rectal: Perianal rash with superficial skin breakdown, nontender digital rectal exam, brown stool in the rectal vault ?Msk:  Symmetrical without gross deformities. Good, equal movement & strength bilaterally. ?Pulses:  Normal pulses noted. ?Extremities:  No clubbing or edema.  No cyanosis. ?Neurologic:  Alert and oriented x3;  grossly normal neurologically. ?Skin:  Intact without significant lesions or rashes. No jaundice. ?Psych:  Alert and cooperative. Normal mood and affect. ? ?Imaging  Studies: ?Reviewed ? ?Assessment and Plan:  ? ?Taylor Grimes is a 61 y.o. pleasant female with grade 1 symptomatic hemorrhoids ?Today, I have discussed regarding outpatient hemorrhoid ligation including the procedure, risks and benefits.  Consent obtained, perform hemorrhoid ligation today ?Also, recommend clotrimazole cream twice daily in the perianal area ? ? ?Follow up in 2 weeks ? ? ?Cephas Darby, MD ? ?

## 2021-12-04 ENCOUNTER — Other Ambulatory Visit: Payer: Self-pay | Admitting: Internal Medicine

## 2021-12-05 ENCOUNTER — Other Ambulatory Visit: Payer: Self-pay | Admitting: Internal Medicine

## 2021-12-05 ENCOUNTER — Encounter: Payer: Self-pay | Admitting: Internal Medicine

## 2021-12-05 ENCOUNTER — Other Ambulatory Visit (INDEPENDENT_AMBULATORY_CARE_PROVIDER_SITE_OTHER): Payer: 59

## 2021-12-05 DIAGNOSIS — I10 Essential (primary) hypertension: Secondary | ICD-10-CM | POA: Diagnosis not present

## 2021-12-05 DIAGNOSIS — E039 Hypothyroidism, unspecified: Secondary | ICD-10-CM | POA: Diagnosis not present

## 2021-12-05 DIAGNOSIS — E1165 Type 2 diabetes mellitus with hyperglycemia: Secondary | ICD-10-CM

## 2021-12-05 LAB — BASIC METABOLIC PANEL
BUN: 10 mg/dL (ref 6–23)
CO2: 30 mEq/L (ref 19–32)
Calcium: 9.4 mg/dL (ref 8.4–10.5)
Chloride: 102 mEq/L (ref 96–112)
Creatinine, Ser: 0.78 mg/dL (ref 0.40–1.20)
GFR: 81.16 mL/min (ref >60.00)
Glucose, Bld: 123 mg/dL — ABNORMAL HIGH (ref 70–99)
Potassium: 3.8 mEq/L (ref 3.5–5.1)
Sodium: 141 mEq/L (ref 135–145)

## 2021-12-05 LAB — HEMOGLOBIN A1C: Hgb A1c MFr Bld: 7.7 % — ABNORMAL HIGH (ref 4.6–6.5)

## 2021-12-05 LAB — TSH: TSH: 0.28 u[IU]/mL — ABNORMAL LOW (ref 0.35–5.50)

## 2021-12-05 LAB — T4, FREE: Free T4: 1.57 ng/dL (ref 0.60–1.60)

## 2021-12-05 MED ORDER — LEVOTHYROXINE SODIUM 112 MCG PO TABS: 112.0000 ug | ORAL_TABLET | Freq: Every day | ORAL | 3 refills | Status: DC

## 2021-12-12 ENCOUNTER — Ambulatory Visit
Admission: RE | Admit: 2021-12-12 | Discharge: 2021-12-12 | Disposition: A | Discharge status: Home / Self Care | Payer: 59 | Source: Ambulatory Visit | Attending: Family Medicine | Admitting: Family Medicine

## 2021-12-12 DIAGNOSIS — Z1231 Encounter for screening mammogram for malignant neoplasm of breast: Secondary | ICD-10-CM | POA: Diagnosis present

## 2021-12-18 ENCOUNTER — Ambulatory Visit: Payer: 59 | Admitting: Gastroenterology

## 2021-12-18 ENCOUNTER — Other Ambulatory Visit: Payer: Self-pay

## 2021-12-18 ENCOUNTER — Encounter: Payer: Self-pay | Admitting: Gastroenterology

## 2021-12-18 VITALS — BP 127/85 | HR 87 | Temp 98.9°F | Ht 61.0 in | Wt 211.0 lb

## 2021-12-18 DIAGNOSIS — R21 Rash and other nonspecific skin eruption: Secondary | ICD-10-CM | POA: Diagnosis not present

## 2021-12-18 DIAGNOSIS — L290 Pruritus ani: Secondary | ICD-10-CM | POA: Diagnosis not present

## 2021-12-18 DIAGNOSIS — K640 First degree hemorrhoids: Secondary | ICD-10-CM | POA: Diagnosis not present

## 2021-12-18 NOTE — Progress Notes (Signed)

## 2021-12-23 ENCOUNTER — Encounter: Payer: Self-pay | Admitting: Podiatry

## 2021-12-23 ENCOUNTER — Ambulatory Visit (INDEPENDENT_AMBULATORY_CARE_PROVIDER_SITE_OTHER): Payer: 59 | Admitting: Podiatry

## 2021-12-23 DIAGNOSIS — M79674 Pain in right toe(s): Secondary | ICD-10-CM

## 2021-12-23 DIAGNOSIS — E119 Type 2 diabetes mellitus without complications: Secondary | ICD-10-CM

## 2021-12-23 DIAGNOSIS — B351 Tinea unguium: Secondary | ICD-10-CM

## 2021-12-23 DIAGNOSIS — M79675 Pain in left toe(s): Secondary | ICD-10-CM

## 2021-12-23 NOTE — Progress Notes (Signed)
Complaint:  Visit Type: Patient returns to my office for continued preventative foot care services. Complaint: Patient states" my nails have grown long and thick and become painful to walk and wear shoes" Patient has been diagnosed with DM with no foot complications. The patient presents for preventative foot care services. No changes to ROS  Podiatric Exam: Vascular: dorsalis pedis and posterior tibial pulses are palpable bilateral. Capillary return is immediate. Temperature gradient is WNL. Skin turgor WNL  Sensorium: Normal Semmes Weinstein monofilament test. Normal tactile sensation bilaterally. Nail Exam: Pt has thick disfigured discolored nails with subungual debris noted bilateral entire nail hallux through fifth toenails Ulcer Exam: There is no evidence of ulcer or pre-ulcerative changes or infection. Orthopedic Exam: Muscle tone and strength are WNL. No limitations in general ROM. No crepitus or effusions noted. Foot type and digits show no abnormalities. Bony prominences are unremarkable. Skin:  Porokeratosis asymptomatic.. No infection or ulcers.    Diagnosis:  Onychomycosis, , Pain in right toe, pain in left toes.   Treatment & Plan Procedures and Treatment: Consent by patient was obtained for treatment procedures.   Debridement of mycotic and hypertrophic toenails, 1 through 5 bilateral and clearing of subungual debris. With nail nipper and dremel tool. No ulceration, no infection noted.  Return Visit-Office Procedure: Patient instructed to return to the office for a follow up visit 3 months for continued evaluation and treatment.    Cameren Earnest DPM 

## 2021-12-24 ENCOUNTER — Ambulatory Visit (INDEPENDENT_AMBULATORY_CARE_PROVIDER_SITE_OTHER): Payer: 59 | Admitting: Family Medicine

## 2021-12-24 VITALS — BP 124/84 | HR 72 | Ht 61.0 in | Wt 208.0 lb

## 2021-12-24 DIAGNOSIS — I10 Essential (primary) hypertension: Secondary | ICD-10-CM

## 2021-12-24 DIAGNOSIS — E119 Type 2 diabetes mellitus without complications: Secondary | ICD-10-CM

## 2021-12-24 DIAGNOSIS — E1169 Type 2 diabetes mellitus with other specified complication: Secondary | ICD-10-CM | POA: Diagnosis not present

## 2021-12-24 DIAGNOSIS — E039 Hypothyroidism, unspecified: Secondary | ICD-10-CM

## 2021-12-24 DIAGNOSIS — E785 Hyperlipidemia, unspecified: Secondary | ICD-10-CM

## 2021-12-24 NOTE — Assessment & Plan Note (Signed)
Lab Results  Component Value Date   HGBA1C 7.7 (H) 12/05/2021   This is down from 8.3 Eating better  Home glucose readings are good Plan to continue metformin 1000 mg bid and amaryl 4 mg daily  On ace and statin  Follow up late aug for re check  Good foot care and eye exam is utd Will plan microalb at next labs

## 2021-12-24 NOTE — Patient Instructions (Addendum)
Eat lean protein and fruit and veggies instead of sweets and bread,pasta, rice and snacks   Keep up the good work    Aim for at least 30 minutes of continuous exercise (walking) daily  The more the better    Schedule fasting labs for late august and then follow up with me

## 2021-12-24 NOTE — Assessment & Plan Note (Signed)
bp in fair control at this time  BP Readings from Last 1 Encounters:  12/24/21 124/84   No changes needed Most recent labs reviewed  Disc lifstyle change with low sodium diet and exercise  Plan to continue losaratan 50 mg daily  Commended on wt loss

## 2021-12-24 NOTE — Progress Notes (Signed)
Subjective:    Patient ID: Taylor Grimes, female    DOB: 1960/09/27, 61 y.o.   MRN: [id number]  HPI Pt presents for f/u of DM2  Wt Readings from Last 3 Encounters:  12/24/21 208 lb (94.3 kg)  12/18/21 211 lb (95.7 kg)  12/03/21 210 lb 4 oz (95.4 kg)   39.30 kg/m    Lab Results  Component Value Date   HGBA1C 7.7 (H) 12/05/2021  Was higher prior to this with episode of sepsis   Amaryl 4 mg daily  Metformin 1000 mg bid   Diet : better  Drinking water  Working on weight loss / smaller portions  No fried foods or red meat  Eating salads   Trying to avoid sugar  Sweets /just 2 bites twice weekly at most  Bread: wheat only, 1-2 times per week Pasta : once per week  Rice : not often  Snack foods : some crackers and peanut butter    Blood sugar  Am: 90s to low 100s   Pm:  around 140s   None higher  None below 70   Exercise - has been doing some walking outside  Takes care of kittens   Eye exam utd spring  Has cataract surgery planned   Had her colonoscopy and mammogram Getting up to date   Getting hemorrhoid surgery   Saw podiatry also   Takes statin and arb   Lab Results  Component Value Date   CHOL 163 09/06/2021   HDL 38.30 (L) 09/06/2021   LDLCALC 90 09/06/2021   LDLDIRECT 111.0 08/19/2019   TRIG 174.0 (H) 09/06/2021   CHOLHDL 4 09/06/2021   Lab Results  Component Value Date   CREATININE 0.78 12/05/2021   BUN 10 12/05/2021   NA 141 12/05/2021   K 3.8 12/05/2021   CL 102 12/05/2021   CO2 30 12/05/2021    Thyroid is improved  Lab Results  Component Value Date   TSH 0.28 (L) 12/05/2021   Dr Cruzita Lederer did change her dose  Will need labs next time   BP Readings from Last 3 Encounters:  12/24/21 124/84  12/18/21 127/85  12/03/21 (!) 149/88    Patient Active Problem List   Diagnosis Date Noted   Personal history of colonic polyps    Polyp of cecum    Fecal occult blood test positive 10/23/2021   Yeast dermatitis 10/15/2021    Encounter for screening mammogram for breast cancer 10/04/2021   Hypergranulation 09/17/2021   Abscess of abdominal wall 08/16/2021   Hyperlipidemia associated with type 2 diabetes mellitus ([place]) 08/17/2018   Hypertrophic toenail 02/02/2018   Anxiety disorder 09/12/2015   Low HDL (under 40) 10/24/2014   Routine general medical examination at a health care facility 03/08/2014   History of endometrial cancer 12/15/2012   Colon cancer screening 11/01/2012   DM type 2 (diabetes mellitus, type 2) ([place]) 06/30/2012   Varicose veins of leg with swelling, left 11/18/2011   Class 2 obesity due to excess calories with body mass index (BMI) of 38.0 to 38.9 in adult 11/12/2010   Essential hypertension 07/12/2009   Hypothyroidism (acquired) 05/16/2009   Past Medical History:  Diagnosis Date   Anxiety    Breast lesion    benign   Diabetes mellitus without complication (HCC)    Elevated transaminase level    ? fatty liver    History of endometrial cancer 12/15/2012   FIGO Grade 2 endometrioid adenocarcinoma with squamous differentiation involving the mucosa only.  From SGO Guidelines:  Recommended surveillance after treatment of endometrial cancer includes a follow-up visit every 3-6 months for 2 years, then every 6 months for 3 years, and annually thereafter. Each follow-up visit should include a thorough patient history; elicitation and investigation of any new symptoms associated with recurrence, such as vaginal bleeding, pelvic pain, weight loss, or lethargy; and a thorough speculum, pelvic, and rectovaginal examination   HTN (hypertension)    Hypothyroid    UTI (urinary tract infection)    Past Surgical History:  Procedure Laterality Date   ABDOMINAL HYSTERECTOMY     BREAST BIOPSY     benign cyst 2011   CESAREAN SECTION     COLONOSCOPY WITH PROPOFOL N/A 11/20/2021   Procedure: COLONOSCOPY WITH PROPOFOL;  Surgeon: Lin Landsman, MD;  Location: [facility] ENDOSCOPY;  Service: Gastroenterology;   Laterality: N/A;   INCISION AND DRAINAGE ABSCESS Right 08/16/2021   Procedure: INCISION AND DRAINAGE ABDOMINAL WALL ABSCESS;  Surgeon: Ronny Bacon, MD;  Location: [facility] ORS;  Service: General;  Laterality: Right;   LAPAROSCOPIC TOTAL HYSTERECTOMY  11/2012   Social History   Tobacco Use   Smoking status: Former    Types: Cigarettes    Quit date: 07/22/1979    Years since quitting: 42.4    Passive exposure: Never   Smokeless tobacco: Never   Tobacco comments:    briefly smoked at 53  Vaping Use   Vaping Use: Never used  Substance Use Topics   Alcohol use: Yes    Comment: rarely   Drug use: No   Family History  Problem Relation Age of Onset   Hypothyroidism Mother    Alzheimer's disease Mother    Stroke Father    Deep vein thrombosis Sister    Colon cancer Neg Hx    Esophageal cancer Neg Hx    Stomach cancer Neg Hx    Rectal cancer Neg Hx    Breast cancer Neg Hx    No Known Allergies Current Outpatient Medications on File Prior to Visit  Medication Sig Dispense Refill   atorvastatin (LIPITOR) 10 MG tablet Take 1 tablet (10 mg total) by mouth daily. In evening with a low fat snack 90 tablet 3   clotrimazole (ANTIFUNGAL, CLOTRIMAZOLE,) 1 % cream Apply 1 application. topically 2 (two) times daily. 30 g 0   glimepiride (AMARYL) 4 MG tablet Take 1 tablet (4 mg total) by mouth daily before breakfast. 90 tablet 3   levothyroxine (SYNTHROID) 125 MCG tablet Take 125 mcg by mouth daily.     losartan (COZAAR) 100 MG tablet Take 0.5 tablets (50 mg total) by mouth daily. 45 tablet 3   metFORMIN (GLUCOPHAGE) 1000 MG tablet Take 1 tablet (1,000 mg total) by mouth 2 (two) times daily with a meal. 180 tablet 3   nystatin (MYCOSTATIN/NYSTOP) powder APPLY 1 APPLICATION. TOPICALLY 3 (THREE) TIMES DAILY. 15 g 0   No current facility-administered medications on file prior to visit.     Review of Systems  Constitutional:  Negative for activity change, appetite change, fatigue, fever and  unexpected weight change.  HENT:  Negative for congestion, ear pain, rhinorrhea, sinus pressure and sore throat.   Eyes:  Negative for pain, redness and visual disturbance.  Respiratory:  Negative for cough, shortness of breath and wheezing.   Cardiovascular:  Negative for chest pain and palpitations.  Gastrointestinal:  Negative for abdominal pain, blood in stool, constipation and diarrhea.  Endocrine: Negative for polydipsia and polyuria.  Genitourinary:  Negative for dysuria, frequency  and urgency.  Musculoskeletal:  Negative for arthralgias, back pain and myalgias.  Skin:  Negative for pallor and rash.  Allergic/Immunologic: Negative for environmental allergies.  Neurological:  Negative for dizziness, syncope and headaches.  Hematological:  Negative for adenopathy. Does not bruise/bleed easily.  Psychiatric/Behavioral:  Negative for decreased concentration and dysphoric mood. The patient is not nervous/anxious.       Objective:   Physical Exam Constitutional:      General: She is not in acute distress.    Appearance: Normal appearance. She is well-developed. She is obese. She is not ill-appearing or diaphoretic.  HENT:     Head: Normocephalic and atraumatic.     Mouth/Throat:     Mouth: Mucous membranes are moist.  Eyes:     Conjunctiva/sclera: Conjunctivae normal.     Pupils: Pupils are equal, round, and reactive to light.  Neck:     Thyroid: No thyromegaly.     Vascular: No carotid bruit or JVD.  Cardiovascular:     Rate and Rhythm: Normal rate and regular rhythm.     Heart sounds: Normal heart sounds.    No gallop.  Pulmonary:     Effort: Pulmonary effort is normal. No respiratory distress.     Breath sounds: Normal breath sounds. No wheezing or rales.  Abdominal:     General: There is no distension or abdominal bruit.     Palpations: Abdomen is soft.  Musculoskeletal:     Cervical back: Normal range of motion and neck supple.     Right lower leg: No edema.     Left  lower leg: No edema.  Lymphadenopathy:     Cervical: No cervical adenopathy.  Skin:    General: Skin is warm and dry.     Coloration: Skin is not pale.     Findings: No rash.  Neurological:     Mental Status: She is alert.     Coordination: Coordination normal.     Deep Tendon Reflexes: Reflexes are normal and symmetric. Reflexes normal.  Psychiatric:        Mood and Affect: Mood normal.          Assessment & Plan:   Problem List Items Addressed This Visit       Cardiovascular and Mediastinum   Essential hypertension    bp in fair control at this time  BP Readings from Last 1 Encounters:  12/24/21 124/84  No changes needed Most recent labs reviewed  Disc lifstyle change with low sodium diet and exercise  Plan to continue losaratan 50 mg daily  Commended on wt loss       Relevant Orders   Lipid panel   Basic metabolic panel     Endocrine   DM type 2 (diabetes mellitus, type 2) ([place]) - Primary    Lab Results  Component Value Date   HGBA1C 7.7 (H) 12/05/2021  This is down from 8.3 Eating better  Home glucose readings are good Plan to continue metformin 1000 mg bid and amaryl 4 mg daily  On ace and statin  Follow up late aug for re check  Good foot care and eye exam is utd Will plan microalb at next labs        Relevant Orders   Hemoglobin A1c   Lipid panel   Basic metabolic panel   Microalbumin / creatinine urine ratio   Hyperlipidemia associated with type 2 diabetes mellitus ([place])    Disc goals for lipids and reasons to control them Rev  last labs with pt Rev low sat fat diet in detail  Tolerating atorvastatin 10 mg daily  Enc to cut back red meat      Relevant Orders   Lipid panel   Hypothyroidism (acquired)    Endocrinology care Taking levothy 125 mcg daily  Feels ok   Will add tsh to next labs in aug       Relevant Orders   TSH

## 2021-12-24 NOTE — Assessment & Plan Note (Signed)
Disc goals for lipids and reasons to control them Rev last labs with pt Rev low sat fat diet in detail  Tolerating atorvastatin 10 mg daily  Enc to cut back red meat

## 2021-12-24 NOTE — Assessment & Plan Note (Signed)
Endocrinology care Taking levothy 125 mcg daily  Feels ok   Will add tsh to next labs in aug

## 2021-12-31 ENCOUNTER — Encounter: Payer: Self-pay | Admitting: Ophthalmology

## 2022-01-01 ENCOUNTER — Encounter: Payer: Self-pay | Admitting: Gastroenterology

## 2022-01-01 ENCOUNTER — Ambulatory Visit: Payer: 59 | Admitting: Gastroenterology

## 2022-01-01 VITALS — BP 134/87 | HR 86 | Temp 97.8°F | Ht 61.0 in | Wt 212.0 lb

## 2022-01-01 DIAGNOSIS — K640 First degree hemorrhoids: Secondary | ICD-10-CM

## 2022-01-01 NOTE — Progress Notes (Signed)
PROCEDURE NOTE: The patient presents with symptomatic grade 1 hemorrhoids, unresponsive to maximal medical therapy, requesting rubber band ligation of his/her hemorrhoidal disease.  All risks, benefits and alternative forms of therapy were described and informed consent was obtained.  The decision was made to band the LL internal hemorrhoid, and the [facility] O'Regan System was used to perform band ligation without complication.  Digital anorectal examination was then performed to assure proper positioning of the band, and to adjust the banded tissue as required.  The patient was discharged home without pain or other issues.  Dietary and behavioral recommendations were given and (if necessary - prescriptions were given), along with follow-up instructions.  The patient will return 2 weeks for follow-up and possible additional banding as required.  No complications were encountered and the patient tolerated the procedure well.    

## 2022-01-01 NOTE — Discharge Instructions (Signed)

## 2022-01-07 ENCOUNTER — Encounter: Payer: Self-pay | Admitting: Ophthalmology

## 2022-01-07 ENCOUNTER — Encounter
Admission: RE | Disposition: A | Discharge status: Home / Self Care | Payer: Self-pay | Source: Home / Self Care | Attending: Ophthalmology

## 2022-01-07 ENCOUNTER — Other Ambulatory Visit: Payer: Self-pay

## 2022-01-07 ENCOUNTER — Ambulatory Visit
Admission: RE | Admit: 2022-01-07 | Discharge: 2022-01-07 | Disposition: A | Discharge status: Home / Self Care | Payer: 59 | Attending: Ophthalmology | Admitting: Ophthalmology

## 2022-01-07 ENCOUNTER — Ambulatory Visit: Payer: 59 | Admitting: Anesthesiology

## 2022-01-07 DIAGNOSIS — E039 Hypothyroidism, unspecified: Secondary | ICD-10-CM | POA: Diagnosis not present

## 2022-01-07 DIAGNOSIS — E1136 Type 2 diabetes mellitus with diabetic cataract: Secondary | ICD-10-CM | POA: Diagnosis not present

## 2022-01-07 DIAGNOSIS — Z6839 Body mass index (BMI) 39.0-39.9, adult: Secondary | ICD-10-CM | POA: Insufficient documentation

## 2022-01-07 DIAGNOSIS — H2511 Age-related nuclear cataract, right eye: Secondary | ICD-10-CM | POA: Insufficient documentation

## 2022-01-07 DIAGNOSIS — Z87891 Personal history of nicotine dependence: Secondary | ICD-10-CM | POA: Diagnosis not present

## 2022-01-07 DIAGNOSIS — I10 Essential (primary) hypertension: Secondary | ICD-10-CM | POA: Diagnosis not present

## 2022-01-07 DIAGNOSIS — Z860100 Personal history of colon polyps, unspecified: Secondary | ICD-10-CM

## 2022-01-07 HISTORY — PX: CATARACT EXTRACTION W/PHACO: SHX586

## 2022-01-07 LAB — GLUCOSE, CAPILLARY
Glucose-Capillary: 217 mg/dL — ABNORMAL HIGH (ref 70–99)
Glucose-Capillary: 203 mg/dL — ABNORMAL HIGH (ref 70–99)

## 2022-01-07 SURGERY — PHACOEMULSIFICATION, CATARACT, WITH IOL INSERTION
Anesthesia: Monitor Anesthesia Care | Site: Eye | Laterality: Right

## 2022-01-07 MED ORDER — SIGHTPATH DOSE#1 NA CHONDROIT SULF-NA HYALURON 40-17 MG/ML IO SOLN
INTRAOCULAR | Status: DC | PRN
  Administered 2022-01-07: 1 mL via INTRAOCULAR

## 2022-01-07 MED ORDER — ARMC OPHTHALMIC DILATING DROPS
1.0000 "application " | OPHTHALMIC | Status: DC | PRN
  Administered 2022-01-07 (×3): 1 via OPHTHALMIC

## 2022-01-07 MED ORDER — FENTANYL CITRATE (PF) 100 MCG/2ML IJ SOLN
INTRAMUSCULAR | Status: DC | PRN
  Administered 2022-01-07: 50 ug via INTRAVENOUS

## 2022-01-07 MED ORDER — SIGHTPATH DOSE#1 BSS IO SOLN
INTRAOCULAR | Status: DC | PRN
  Administered 2022-01-07: 67 mL via OPHTHALMIC

## 2022-01-07 MED ORDER — LACTATED RINGERS IV SOLN: INTRAVENOUS | Status: DC

## 2022-01-07 MED ORDER — ACETAMINOPHEN 160 MG/5ML PO SOLN: 325.0000 mg | Freq: Once | ORAL | Status: DC

## 2022-01-07 MED ORDER — SIGHTPATH DOSE#1 BSS IO SOLN
INTRAOCULAR | Status: DC | PRN
  Administered 2022-01-07: 15 mL

## 2022-01-07 MED ORDER — SIGHTPATH DOSE#1 BSS IO SOLN
INTRAOCULAR | Status: DC | PRN
  Administered 2022-01-07: 1 mL via INTRAMUSCULAR

## 2022-01-07 MED ORDER — MOXIFLOXACIN HCL 0.5 % OP SOLN
OPHTHALMIC | Status: DC | PRN
  Administered 2022-01-07: 0.2 mL via OPHTHALMIC

## 2022-01-07 MED ORDER — BRIMONIDINE TARTRATE-TIMOLOL 0.2-0.5 % OP SOLN
OPHTHALMIC | Status: DC | PRN
  Administered 2022-01-07: 1 [drp] via OPHTHALMIC

## 2022-01-07 MED ORDER — ACETAMINOPHEN 325 MG PO TABS: 325.0000 mg | ORAL_TABLET | Freq: Once | ORAL | Status: DC

## 2022-01-07 MED ORDER — MIDAZOLAM HCL 2 MG/2ML IJ SOLN
INTRAMUSCULAR | Status: DC | PRN
  Administered 2022-01-07: 1 mg via INTRAVENOUS

## 2022-01-07 MED ORDER — TETRACAINE HCL 0.5 % OP SOLN
1.0000 [drp] | OPHTHALMIC | Status: DC | PRN
  Administered 2022-01-07 (×3): 1 [drp] via OPHTHALMIC

## 2022-01-07 SURGICAL SUPPLY — 10 items
CATARACT SUITE SIGHTPATH (MISCELLANEOUS) ×2 IMPLANT
FEE CATARACT SUITE SIGHTPATH (MISCELLANEOUS) ×1 IMPLANT
GLOVE SURG ENC TEXT LTX SZ8 (GLOVE) ×2 IMPLANT
GLOVE SURG TRIUMPH 8.0 PF LTX (GLOVE) ×2 IMPLANT
LENS IOL TECNIS EYHANCE 22.5 (Intraocular Lens) ×1 IMPLANT
NDL FILTER BLUNT 18X1 1/2 (NEEDLE) ×1 IMPLANT
NEEDLE FILTER BLUNT 18X 1/2SAF (NEEDLE) ×1
NEEDLE FILTER BLUNT 18X1 1/2 (NEEDLE) ×1 IMPLANT
SYR 3ML LL SCALE MARK (SYRINGE) ×2 IMPLANT
WATER STERILE IRR 250ML POUR (IV SOLUTION) ×2 IMPLANT

## 2022-01-07 NOTE — Op Note (Signed)
PREOPERATIVE DIAGNOSIS:  Nuclear sclerotic cataract of the right eye.   POSTOPERATIVE DIAGNOSIS:  * No post-op diagnosis entered *   OPERATIVE PROCEDURE:ORPROCALL@   SURGEON:  Birder Robson, MD.   ANESTHESIA:  Anesthesiologist: Ronelle Nigh, MD CRNA: Dionne Bucy, CRNA  1.      Managed anesthesia care. 2.      0.83m of Shugarcaine was instilled in the eye following the paracentesis.   COMPLICATIONS:  None.   TECHNIQUE:   Stop and chop   DESCRIPTION OF PROCEDURE:  The patient was examined and consented in the preoperative holding area where the aforementioned topical anesthesia was applied to the right eye and then brought back to the Operating Room where the right eye was prepped and draped in the usual sterile ophthalmic fashion and a lid speculum was placed. A paracentesis was created with the side port blade and the anterior chamber was filled with viscoelastic. A near clear corneal incision was performed with the steel keratome. A continuous curvilinear capsulorrhexis was performed with a cystotome followed by the capsulorrhexis forceps. Hydrodissection and hydrodelineation were carried out with BSS on a blunt cannula. The lens was removed in a stop and chop  technique and the remaining cortical material was removed with the irrigation-aspiration handpiece. The capsular bag was inflated with viscoelastic and the Technis [id number]  lens was placed in the capsular bag without complication. The remaining viscoelastic was removed from the eye with the irrigation-aspiration handpiece. The wounds were hydrated. The anterior chamber was flushed with BSS and the eye was inflated to physiologic pressure. 0.162mof Vigamox was placed in the anterior chamber. The wounds were found to be water tight. The eye was dressed with Combigan. The patient was given protective glasses to wear throughout the day and a shield with which to sleep tonight. The patient was also given drops with which to begin a  drop regimen today and will follow-up with me in one day. Implant Name Type Inv. Item Serial No. Manufacturer Lot No. LRB No. Used Action  LENS IOL TECNIS EYHANCE 22.5 - [id number] Lens LENS IOL TECNIS EYHANCE 22.5 [id number]  Right 1 Implanted   Procedure(s) with comments: CATARACT EXTRACTION PHACO AND INTRAOCULAR LENS PLACEMENT (IOC) RIGHT DIABETIC 8.04 01:01.7 (Right) - Diabetic  Electronically signed: WiBirder Robson/20/2023 7:51 AM

## 2022-01-07 NOTE — H&P (Signed)
[facility]   Primary Care Physician:  Tower, Wynelle Fanny, MD Ophthalmologist: Dr. George Ina  Pre-Procedure History & Physical: HPI:  Taylor Grimes is a 61 y.o. female here for cataract surgery.   Past Medical History:  Diagnosis Date   Anxiety    Breast lesion    benign   Diabetes mellitus without complication (HCC)    Elevated transaminase level    ? fatty liver    History of endometrial cancer 12/15/2012   FIGO Grade 2 endometrioid adenocarcinoma with squamous differentiation involving the mucosa only.  From SGO Guidelines:  Recommended surveillance after treatment of endometrial cancer includes a follow-up visit every 3-6 months for 2 years, then every 6 months for 3 years, and annually thereafter. Each follow-up visit should include a thorough patient history; elicitation and investigation of any new symptoms associated with recurrence, such as vaginal bleeding, pelvic pain, weight loss, or lethargy; and a thorough speculum, pelvic, and rectovaginal examination   HTN (hypertension)    Hypothyroid    UTI (urinary tract infection)     Past Surgical History:  Procedure Laterality Date   ABDOMINAL HYSTERECTOMY     BREAST BIOPSY     benign cyst 2011   CESAREAN SECTION     COLONOSCOPY WITH PROPOFOL N/A 11/20/2021   Procedure: COLONOSCOPY WITH PROPOFOL;  Surgeon: Lin Landsman, MD;  Location: [facility] ENDOSCOPY;  Service: Gastroenterology;  Laterality: N/A;   INCISION AND DRAINAGE ABSCESS Right 08/16/2021   Procedure: INCISION AND DRAINAGE ABDOMINAL WALL ABSCESS;  Surgeon: Ronny Bacon, MD;  Location: [facility] ORS;  Service: General;  Laterality: Right;   LAPAROSCOPIC TOTAL HYSTERECTOMY  11/2012    Prior to Admission medications   Medication Sig Start Date End Date Taking? Authorizing Provider  atorvastatin (LIPITOR) 10 MG tablet Take 1 tablet (10 mg total) by mouth daily. In evening with a low fat snack 10/04/21  Yes Tower, Wynelle Fanny, MD  glimepiride (AMARYL) 4 MG tablet Take 1  tablet (4 mg total) by mouth daily before breakfast. 10/04/21  Yes Tower, Wynelle Fanny, MD  levothyroxine (SYNTHROID) 125 MCG tablet Take 125 mcg by mouth daily. 12/09/21  Yes [provider]  losartan (COZAAR) 100 MG tablet Take 0.5 tablets (50 mg total) by mouth daily. 10/04/21  Yes Tower, Wynelle Fanny, MD  metFORMIN (GLUCOPHAGE) 1000 MG tablet Take 1 tablet (1,000 mg total) by mouth 2 (two) times daily with a meal. 10/04/21  Yes Tower, Marne A, MD  clotrimazole (ANTIFUNGAL, CLOTRIMAZOLE,) 1 % cream Apply 1 application. topically 2 (two) times daily. 12/03/21   Lin Landsman, MD  nystatin (MYCOSTATIN/NYSTOP) powder APPLY 1 APPLICATION. TOPICALLY 3 (THREE) TIMES DAILY. 11/19/21   Ronny Bacon, MD    Allergies as of 12/09/2021   (No Known Allergies)    Family History  Problem Relation Age of Onset   Hypothyroidism Mother    Alzheimer's disease Mother    Stroke Father    Deep vein thrombosis Sister    Colon cancer Neg Hx    Esophageal cancer Neg Hx    Stomach cancer Neg Hx    Rectal cancer Neg Hx    Breast cancer Neg Hx     Social History   Socioeconomic History   Marital status: Legally Separated    Spouse name: Not on file   Number of children: 2   Years of education: Not on file   Highest education level: Not on file  Occupational History   Occupation: shift Freight forwarder  Employer: [employer]  Tobacco Use   Smoking status: Former    Types: Cigarettes    Quit date: 07/22/1979    Years since quitting: 42.4    Passive exposure: Never   Smokeless tobacco: Never   Tobacco comments:    briefly smoked at 37.  (May occasionally have 1 cig)  Vaping Use   Vaping Use: Never used  Substance and Sexual Activity   Alcohol use: Yes    Comment: rarely   Drug use: No   Sexual activity: Yes    Birth control/protection: Post-menopausal  Other Topics Concern   Not on file  Social History Narrative   Not on file   Social Determinants of Health   Financial Resource  Strain: Not on file  Food Insecurity: Not on file  Transportation Needs: Not on file  Physical Activity: Not on file  Stress: Not on file  Social Connections: Not on file  Intimate Partner Violence: Not on file    Review of Systems: See HPI, otherwise negative ROS  Physical Exam: BP (!) 188/93   Pulse 80   Temp 97.9 F (36.6 C)   Ht '5\' 1"'$  (1.549 m)   Wt 94.8 kg   LMP 07/21/2008   SpO2 98%   BMI 39.49 kg/m  General:   Alert, cooperative in NAD Head:  Normocephalic and atraumatic. Respiratory:  Normal work of breathing. Cardiovascular:  RRR  Impression/Plan: Taylor Grimes is here for cataract surgery.  Risks, benefits, limitations, and alternatives regarding cataract surgery have been reviewed with the patient.  Questions have been answered.  All parties agreeable.   Birder Robson, MD  01/07/2022, 7:14 AM

## 2022-01-07 NOTE — Transfer of Care (Signed)
Immediate Anesthesia Transfer of Care Note  Patient: Taylor Grimes  Procedure(s) Performed: CATARACT EXTRACTION PHACO AND INTRAOCULAR LENS PLACEMENT (IOC) RIGHT DIABETIC 8.04 01:01.7 (Right: Eye)  Patient Location: PACU  Anesthesia Type: MAC  Level of Consciousness: awake, alert  and patient cooperative  Airway and Oxygen Therapy: Patient Spontanous Breathing and Patient connected to supplemental oxygen  Post-op Assessment: Post-op Vital signs reviewed, Patient's Cardiovascular Status Stable, Respiratory Function Stable, Patent Airway and No signs of Nausea or vomiting  Post-op Vital Signs: Reviewed and stable  Complications: No notable events documented.

## 2022-01-07 NOTE — Anesthesia Preprocedure Evaluation (Signed)
Anesthesia Evaluation  Patient identified by MRN, date of birth, ID band Patient awake    Reviewed: Allergy & Precautions, H&P , NPO status , Patient's Chart, lab work & pertinent test results  Airway Mallampati: II  TM Distance: >3 FB Neck ROM: full    Dental no notable dental hx.    Pulmonary former smoker,    Pulmonary exam normal breath sounds clear to auscultation       Cardiovascular hypertension, Normal cardiovascular exam Rhythm:regular Rate:Normal     Neuro/Psych PSYCHIATRIC DISORDERS    GI/Hepatic   Endo/Other  diabetesHypothyroidism Morbid obesity  Renal/GU      Musculoskeletal   Abdominal   Peds  Hematology   Anesthesia Other Findings   Reproductive/Obstetrics                             Anesthesia Physical Anesthesia Plan  ASA: 3  Anesthesia Plan: MAC   Post-op Pain Management: Minimal or no pain anticipated   Induction:   PONV Risk Score and Plan: 2 and Treatment may vary due to age or medical condition, TIVA and Midazolam  Airway Management Planned:   Additional Equipment:   Intra-op Plan:   Post-operative Plan:   Informed Consent: I have reviewed the patients History and Physical, chart, labs and discussed the procedure including the risks, benefits and alternatives for the proposed anesthesia with the patient or authorized representative who has indicated his/her understanding and acceptance.     Dental Advisory Given  Plan Discussed with: CRNA  Anesthesia Plan Comments:         Anesthesia Quick Evaluation

## 2022-01-07 NOTE — Anesthesia Procedure Notes (Signed)
Procedure Name: MAC Date/Time: 01/07/2022 7:30 AM  Performed by: Dionne Bucy, CRNAPre-anesthesia Checklist: Patient identified, Emergency Drugs available, Suction available, Patient being monitored and Timeout performed Patient Re-evaluated:Patient Re-evaluated prior to induction Oxygen Delivery Method: Nasal cannula Placement Confirmation: positive ETCO2

## 2022-01-07 NOTE — Anesthesia Postprocedure Evaluation (Signed)
Anesthesia Post Note  Patient: Taylor Grimes  Procedure(s) Performed: CATARACT EXTRACTION PHACO AND INTRAOCULAR LENS PLACEMENT (IOC) RIGHT DIABETIC 8.04 01:01.7 (Right: Eye)     Patient location during evaluation: PACU Anesthesia Type: MAC Level of consciousness: awake and alert and oriented Pain management: satisfactory to patient Vital Signs Assessment: post-procedure vital signs reviewed and stable Respiratory status: spontaneous breathing, nonlabored ventilation and respiratory function stable Cardiovascular status: blood pressure returned to baseline and stable Postop Assessment: Adequate PO intake and No signs of nausea or vomiting Anesthetic complications: no   No notable events documented.  Raliegh Ip

## 2022-01-08 ENCOUNTER — Encounter: Payer: Self-pay | Admitting: Ophthalmology

## 2022-02-18 ENCOUNTER — Other Ambulatory Visit: Payer: Self-pay | Admitting: Internal Medicine

## 2022-03-17 ENCOUNTER — Other Ambulatory Visit (INDEPENDENT_AMBULATORY_CARE_PROVIDER_SITE_OTHER): Payer: 59

## 2022-03-17 ENCOUNTER — Encounter: Payer: Self-pay | Admitting: Internal Medicine

## 2022-03-17 DIAGNOSIS — E1169 Type 2 diabetes mellitus with other specified complication: Secondary | ICD-10-CM | POA: Diagnosis not present

## 2022-03-17 DIAGNOSIS — I10 Essential (primary) hypertension: Secondary | ICD-10-CM

## 2022-03-17 DIAGNOSIS — E039 Hypothyroidism, unspecified: Secondary | ICD-10-CM | POA: Diagnosis not present

## 2022-03-17 DIAGNOSIS — E119 Type 2 diabetes mellitus without complications: Secondary | ICD-10-CM | POA: Diagnosis not present

## 2022-03-17 DIAGNOSIS — E785 Hyperlipidemia, unspecified: Secondary | ICD-10-CM | POA: Diagnosis not present

## 2022-03-17 LAB — LIPID PANEL
Cholesterol: 189 mg/dL (ref 0–200)
HDL: 33.5 mg/dL — ABNORMAL LOW (ref >39.00)
LDL Cholesterol: 118 mg/dL — ABNORMAL HIGH (ref 0–99)
NonHDL: 155.51
Total CHOL/HDL Ratio: 6
Triglycerides: 188 mg/dL — ABNORMAL HIGH (ref 0.0–149.0)
VLDL: 37.6 mg/dL (ref 0.0–40.0)

## 2022-03-17 LAB — HEMOGLOBIN A1C: Hgb A1c MFr Bld: 9.2 % — ABNORMAL HIGH (ref 4.6–6.5)

## 2022-03-17 LAB — BASIC METABOLIC PANEL
BUN: 12 mg/dL (ref 6–23)
CO2: 27 mEq/L (ref 19–32)
Calcium: 10 mg/dL (ref 8.4–10.5)
Chloride: 103 mEq/L (ref 96–112)
Creatinine, Ser: 0.81 mg/dL (ref 0.40–1.20)
GFR: 77.41 mL/min (ref >60.00)
Glucose, Bld: 173 mg/dL — ABNORMAL HIGH (ref 70–99)
Potassium: 4.1 mEq/L (ref 3.5–5.1)
Sodium: 138 mEq/L (ref 135–145)

## 2022-03-17 LAB — MICROALBUMIN / CREATININE URINE RATIO
Creatinine,U: 103.9 mg/dL
Microalb Creat Ratio: 2.5 mg/g (ref 0.0–30.0)
Microalb, Ur: 2.6 mg/dL — ABNORMAL HIGH (ref 0.0–1.9)

## 2022-03-17 LAB — T4, FREE: Free T4: 1.59 ng/dL (ref 0.60–1.60)

## 2022-03-17 LAB — TSH: TSH: 0.37 u[IU]/mL (ref 0.35–5.50)

## 2022-03-18 ENCOUNTER — Other Ambulatory Visit: Payer: Self-pay | Admitting: Internal Medicine

## 2022-03-18 MED ORDER — LEVOTHYROXINE SODIUM 112 MCG PO TABS
112.0000 ug | ORAL_TABLET | Freq: Every day | ORAL | 3 refills | Status: DC
Start: 2022-03-18 — End: 2022-07-22

## 2022-03-25 ENCOUNTER — Ambulatory Visit (INDEPENDENT_AMBULATORY_CARE_PROVIDER_SITE_OTHER): Payer: 59 | Admitting: Family Medicine

## 2022-03-25 ENCOUNTER — Encounter: Payer: Self-pay | Admitting: Family Medicine

## 2022-03-25 VITALS — BP 118/72 | HR 71 | Temp 97.6°F | Ht 61.0 in | Wt 207.4 lb

## 2022-03-25 DIAGNOSIS — E119 Type 2 diabetes mellitus without complications: Secondary | ICD-10-CM

## 2022-03-25 DIAGNOSIS — E1169 Type 2 diabetes mellitus with other specified complication: Secondary | ICD-10-CM

## 2022-03-25 DIAGNOSIS — E785 Hyperlipidemia, unspecified: Secondary | ICD-10-CM

## 2022-03-25 DIAGNOSIS — I10 Essential (primary) hypertension: Secondary | ICD-10-CM

## 2022-03-25 NOTE — Patient Instructions (Addendum)
I want you to work up to at least 30 minutes of exercise daily   Walking or going to the gym or doing indoor exercise at home   For diabetes- limit fruit portions Try to get most of your carbohydrates from produce (with the exception of white potatoes)  Eat less bread/pasta/rice/snack foods/cereals/sweets and other items from the middle of the grocery store (processed carbs)  For cholesterol  Avoid red meat/ fried foods/ egg yolks/ fatty breakfast meats/ butter, cheese and high fat dairy/ and shellfish    Please check glucose twice daily  In the am (fasting)  Then 2 hours after evening meal  Keep a log Also write down what you eat   Follow up here in approx 2-3 weeks

## 2022-03-25 NOTE — Assessment & Plan Note (Signed)
Lab Results  Component Value Date   HGBA1C 9.2 (H) 03/17/2022  this is up significantly from 7.7  Suspect eating sub optimally now that she is not working  Declines DM teaching  Discussed low glycemic diet  Plan to check glucose bid and prn and f/u 2-3 wk with glucose log and food log Continue metformin 1000 mg bid  amaryl 4 mg daily  Taking ace and statin  Eye exam is utd Good foot care

## 2022-03-25 NOTE — Progress Notes (Signed)
Subjective:    Patient ID: Taylor Grimes, female    DOB: 1960/09/03, 61 y.o.   MRN: [id number]  HPI Pt presents for f/u of DM2 and chronic health problems   Wt Readings from Last 3 Encounters:  03/25/22 207 lb 6 oz (94.1 kg)  01/07/22 209 lb (94.8 kg)  01/01/22 212 lb (96.2 kg)   39.18 kg/m  Had cataract surgery  One eye and it went well   [place]    HTN bp is stable today  No cp or palpitations or headaches or edema  No side effects to medicines  BP Readings from Last 3 Encounters:  03/25/22 118/72  01/07/22 (!) 151/81  01/01/22 134/87     Losartan 50 mg daily  Pulse Readings from Last 3 Encounters:  03/25/22 71  01/07/22 74  01/01/22 86    DM2 Lab Results  Component Value Date   HGBA1C 9.2 (H) 03/17/2022   This is up significantly from 7.7  Metformin 1000 mg bid Amaryl 4 mg daily  Taking ace and statin   Eating more fruit - she eats a container of water melon or cantelope  A little rice Bread- more than she was/ at home more  Pasta once per week Some crackers  No white potatoes   No sweets  A honey bun 2 times per week for 3 weeks  Drinking more water   She has never been to DM ed  She uses her friend's paper from her class  Declines DM ed   Wants to start walking for exercise  Not working- would have time to go to the gym    No exercise   Eye exam utd from April  Lab Results  Component Value Date   MICROALBUR 2.6 (H) 03/17/2022  Ratio of 2.5   Flu shot - declines, will get later  Hyperlipidemia Lab Results  Component Value Date   CHOL 189 03/17/2022   CHOL 163 09/06/2021   CHOL 168 12/03/2020   Lab Results  Component Value Date   HDL 33.50 (L) 03/17/2022   HDL 38.30 (L) 09/06/2021   HDL 36.90 (L) 12/03/2020   Lab Results  Component Value Date   LDLCALC 118 (H) 03/17/2022   LDLCALC 90 09/06/2021   LDLCALC 99 12/03/2020   Lab Results  Component Value Date   TRIG 188.0 (H) 03/17/2022    TRIG 174.0 (H) 09/06/2021   TRIG 160.0 (H) 12/03/2020   Lab Results  Component Value Date   CHOLHDL 6 03/17/2022   CHOLHDL 4 09/06/2021   CHOLHDL 5 12/03/2020   Lab Results  Component Value Date   LDLDIRECT 111.0 08/19/2019   LDLDIRECT 113.0 06/17/2017   Atorvastatin 10 mg daily   Eats beef  Sometimes fried food  Using olive oil    Patient Active Problem List   Diagnosis Date Noted   Personal history of colonic polyps    Polyp of cecum    Fecal occult blood test positive 10/23/2021   Yeast dermatitis 10/15/2021   Encounter for screening mammogram for breast cancer 10/04/2021   Hypergranulation 09/17/2021   Abscess of abdominal wall 08/16/2021   Hyperlipidemia associated with type 2 diabetes mellitus ([place]) 08/17/2018   Hypertrophic toenail 02/02/2018   Anxiety disorder 09/12/2015   Low HDL (under 40) 10/24/2014   Routine general medical examination at a health care facility 03/08/2014   History of endometrial cancer 12/15/2012   Colon cancer screening 11/01/2012   DM type  2 (diabetes mellitus, type 2) ([place]) 06/30/2012   Varicose veins of leg with swelling, left 11/18/2011   Class 2 obesity due to excess calories with body mass index (BMI) of 38.0 to 38.9 in adult 11/12/2010   Essential hypertension 07/12/2009   Hypothyroidism (acquired) 05/16/2009   Past Medical History:  Diagnosis Date   Anxiety    Breast lesion    benign   Diabetes mellitus without complication (HCC)    Elevated transaminase level    ? fatty liver    History of endometrial cancer 12/15/2012   FIGO Grade 2 endometrioid adenocarcinoma with squamous differentiation involving the mucosa only.  From SGO Guidelines:  Recommended surveillance after treatment of endometrial cancer includes a follow-up visit every 3-6 months for 2 years, then every 6 months for 3 years, and annually thereafter. Each follow-up visit should include a thorough patient history; elicitation and investigation of any new symptoms  associated with recurrence, such as vaginal bleeding, pelvic pain, weight loss, or lethargy; and a thorough speculum, pelvic, and rectovaginal examination   HTN (hypertension)    Hypothyroid    UTI (urinary tract infection)    Past Surgical History:  Procedure Laterality Date   ABDOMINAL HYSTERECTOMY     BREAST BIOPSY     benign cyst 2011   CATARACT EXTRACTION W/PHACO Right 01/07/2022   Procedure: CATARACT EXTRACTION PHACO AND INTRAOCULAR LENS PLACEMENT ([place]) RIGHT DIABETIC 8.04 01:01.7;  Surgeon: Birder Robson, MD;  Location: [place];  Service: Ophthalmology;  Laterality: Right;  Diabetic   CESAREAN SECTION     COLONOSCOPY WITH PROPOFOL N/A 11/20/2021   Procedure: COLONOSCOPY WITH PROPOFOL;  Surgeon: Lin Landsman, MD;  Location: [facility] ENDOSCOPY;  Service: Gastroenterology;  Laterality: N/A;   INCISION AND DRAINAGE ABSCESS Right 08/16/2021   Procedure: INCISION AND DRAINAGE ABDOMINAL WALL ABSCESS;  Surgeon: Ronny Bacon, MD;  Location: [facility];  Service: General;  Laterality: Right;   LAPAROSCOPIC TOTAL HYSTERECTOMY  11/2012   Social History   Tobacco Use   Smoking status: Former    Types: Cigarettes    Quit date: 07/22/1979    Years since quitting: 42.7    Passive exposure: Never   Smokeless tobacco: Never   Tobacco comments:    briefly smoked at 70.  (May occasionally have 1 cig)  Vaping Use   Vaping Use: Never used  Substance Use Topics   Alcohol use: Yes    Comment: rarely   Drug use: No   Family History  Problem Relation Age of Onset   Hypothyroidism Mother    Alzheimer's disease Mother    Stroke Father    Deep vein thrombosis Sister    Colon cancer Neg Hx    Esophageal cancer Neg Hx    Stomach cancer Neg Hx    Rectal cancer Neg Hx    Breast cancer Neg Hx    No Known Allergies Current Outpatient Medications on File Prior to Visit  Medication Sig Dispense Refill   atorvastatin (LIPITOR) 10 MG tablet Take 1 tablet (10 mg total) by mouth  daily. In evening with a low fat snack 90 tablet 3   glimepiride (AMARYL) 4 MG tablet Take 1 tablet (4 mg total) by mouth daily before breakfast. 90 tablet 3   levothyroxine (SYNTHROID) 112 MCG tablet Take 1 tablet (112 mcg total) by mouth daily. 90 tablet 3   losartan (COZAAR) 100 MG tablet Take 0.5 tablets (50 mg total) by mouth daily. 45 tablet 3   metFORMIN (GLUCOPHAGE) 1000 MG  tablet Take 1 tablet (1,000 mg total) by mouth 2 (two) times daily with a meal. 180 tablet 3   No current facility-administered medications on file prior to visit.      Review of Systems  Constitutional:  Negative for activity change, appetite change, fatigue, fever and unexpected weight change.  HENT:  Negative for congestion, ear pain, rhinorrhea, sinus pressure and sore throat.   Eyes:  Negative for pain, redness and visual disturbance.  Respiratory:  Negative for cough, shortness of breath and wheezing.   Cardiovascular:  Negative for chest pain and palpitations.  Gastrointestinal:  Negative for abdominal pain, blood in stool, constipation and diarrhea.  Endocrine: Negative for polydipsia and polyuria.  Genitourinary:  Negative for dysuria, frequency and urgency.  Musculoskeletal:  Negative for arthralgias, back pain and myalgias.  Skin:  Negative for pallor and rash.  Allergic/Immunologic: Negative for environmental allergies.  Neurological:  Negative for dizziness, syncope and headaches.  Hematological:  Negative for adenopathy. Does not bruise/bleed easily.  Psychiatric/Behavioral:  Negative for decreased concentration and dysphoric mood. The patient is not nervous/anxious.        Objective:   Physical Exam Constitutional:      General: She is not in acute distress.    Appearance: Normal appearance. She is well-developed. She is obese. She is not ill-appearing or diaphoretic.  HENT:     Head: Normocephalic and atraumatic.     Mouth/Throat:     Mouth: Mucous membranes are moist.  Eyes:      General:        Right eye: No discharge.        Left eye: No discharge.     Conjunctiva/sclera: Conjunctivae normal.     Pupils: Pupils are equal, round, and reactive to light.  Neck:     Thyroid: No thyromegaly.     Vascular: No carotid bruit or JVD.  Cardiovascular:     Rate and Rhythm: Normal rate and regular rhythm.     Heart sounds: Normal heart sounds.     No gallop.  Pulmonary:     Effort: Pulmonary effort is normal. No respiratory distress.     Breath sounds: Normal breath sounds. No wheezing or rales.  Abdominal:     General: There is no distension or abdominal bruit.     Palpations: Abdomen is soft.  Musculoskeletal:     Cervical back: Normal range of motion and neck supple.     Right lower leg: No edema.     Left lower leg: No edema.  Lymphadenopathy:     Cervical: No cervical adenopathy.  Skin:    General: Skin is warm and dry.     Coloration: Skin is not pale.     Findings: No rash.  Neurological:     Mental Status: She is alert.     Coordination: Coordination normal.     Deep Tendon Reflexes: Reflexes are normal and symmetric. Reflexes normal.  Psychiatric:        Mood and Affect: Mood normal.           Assessment & Plan:   Problem List Items Addressed This Visit       Cardiovascular and Mediastinum   Essential hypertension    bp in fair control at this time  BP Readings from Last 1 Encounters:  03/25/22 118/72  No changes needed Most recent labs reviewed  Disc lifstyle change with low sodium diet and exercise  Plan to continue losartan 50 mg daily  Endocrine   DM type 2 (diabetes mellitus, type 2) ([place]) - Primary    Lab Results  Component Value Date   HGBA1C 9.2 (H) 03/17/2022  this is up significantly from 7.7  Suspect eating sub optimally now that she is not working  Declines DM teaching  Discussed low glycemic diet  Plan to check glucose bid and prn and f/u 2-3 wk with glucose log and food log Continue metformin 1000 mg  bid  amaryl 4 mg daily  Taking ace and statin  Eye exam is utd Good foot care        Hyperlipidemia associated with type 2 diabetes mellitus ([place])    Disc goals for lipids and reasons to control them Rev last labs with pt Rev low sat fat diet in detail  Trig are up likely due to hyperglycemia Tolerates atorvastatin 10 mg daily  LDL up due to diet- 118  Will work on this and re check in 3 months  Consider inc statin if needed to get to goal

## 2022-03-25 NOTE — Assessment & Plan Note (Signed)
bp in fair control at this time  BP Readings from Last 1 Encounters:  03/25/22 118/72   No changes needed Most recent labs reviewed  Disc lifstyle change with low sodium diet and exercise  Plan to continue losartan 50 mg daily

## 2022-03-25 NOTE — Assessment & Plan Note (Signed)
Disc goals for lipids and reasons to control them Rev last labs with pt Rev low sat fat diet in detail  Trig are up likely due to hyperglycemia Tolerates atorvastatin 10 mg daily  LDL up due to diet- 118  Will work on this and re check in 3 months  Consider inc statin if needed to get to goal

## 2022-04-03 ENCOUNTER — Encounter: Payer: Self-pay | Admitting: Podiatry

## 2022-04-03 ENCOUNTER — Ambulatory Visit (INDEPENDENT_AMBULATORY_CARE_PROVIDER_SITE_OTHER): Payer: 59 | Admitting: Podiatry

## 2022-04-03 DIAGNOSIS — B351 Tinea unguium: Secondary | ICD-10-CM | POA: Diagnosis not present

## 2022-04-03 DIAGNOSIS — E119 Type 2 diabetes mellitus without complications: Secondary | ICD-10-CM | POA: Diagnosis not present

## 2022-04-03 DIAGNOSIS — M79675 Pain in left toe(s): Secondary | ICD-10-CM

## 2022-04-03 DIAGNOSIS — M79674 Pain in right toe(s): Secondary | ICD-10-CM

## 2022-04-03 NOTE — Progress Notes (Signed)
Complaint:  Visit Type: Patient returns to my office for continued preventative foot care services. Complaint: Patient states" my nails have grown long and thick and become painful to walk and wear shoes" Patient has been diagnosed with DM with no foot complications. The patient presents for preventative foot care services. No changes to ROS  Podiatric Exam: Vascular: dorsalis pedis and posterior tibial pulses are palpable bilateral. Capillary return is immediate. Temperature gradient is WNL. Skin turgor WNL  Sensorium: Normal Semmes Weinstein monofilament test. Normal tactile sensation bilaterally. Nail Exam: Pt has thick disfigured discolored nails with subungual debris noted bilateral entire nail hallux through fifth toenails Ulcer Exam: There is no evidence of ulcer or pre-ulcerative changes or infection. Orthopedic Exam: Muscle tone and strength are WNL. No limitations in general ROM. No crepitus or effusions noted. Foot type and digits show no abnormalities. Bony prominences are unremarkable. Skin:  Porokeratosis asymptomatic.. No infection or ulcers.    Diagnosis:  Onychomycosis, , Pain in right toe, pain in left toes.   Treatment & Plan Procedures and Treatment: Consent by patient was obtained for treatment procedures.   Debridement of mycotic and hypertrophic toenails, 1 through 5 bilateral and clearing of subungual debris. With nail nipper and dremel tool. No ulceration, no infection noted.  Return Visit-Office Procedure: Patient instructed to return to the office for a follow up visit 3 months for continued evaluation and treatment.    Shad Ledvina DPM 

## 2022-04-15 ENCOUNTER — Encounter: Payer: Self-pay | Admitting: Family Medicine

## 2022-04-15 ENCOUNTER — Ambulatory Visit (INDEPENDENT_AMBULATORY_CARE_PROVIDER_SITE_OTHER): Payer: 59 | Admitting: Family Medicine

## 2022-04-15 VITALS — BP 124/76 | HR 76 | Temp 97.5°F | Ht 61.0 in | Wt 206.2 lb

## 2022-04-15 DIAGNOSIS — Z23 Encounter for immunization: Secondary | ICD-10-CM | POA: Diagnosis not present

## 2022-04-15 DIAGNOSIS — E119 Type 2 diabetes mellitus without complications: Secondary | ICD-10-CM

## 2022-04-15 NOTE — Progress Notes (Signed)
Subjective:    Patient ID: Taylor Grimes, female    DOB: April 12, 1961, 61 y.o.   MRN: [id number]  HPI Pt presents for f/u of DM2  Wt Readings from Last 3 Encounters:  04/15/22 206 lb 3.2 oz (93.5 kg)  03/25/22 207 lb 6 oz (94.1 kg)  01/07/22 209 lb (94.8 kg)   38.96 kg/m   DM2 Lab Results  Component Value Date   HGBA1C 9.2 (H) 03/17/2022   This was up from 7.7 Was eating poorly  Declined dm teaching   Inst to check glucose bid for 2-3 wk  Metformin 1000 mg bid Amaryl 4 mg daily   On ace and statin Eye exam  Lab Results  Component Value Date   MICROALBUR 2.6 (H) 03/17/2022   Eating better  Egg /occ bacon in am  Some fruit Plans to try oatmeal   Sometimes cheerios   Some corn   Salads  Green peas  Cucumbers  Onions   Has a snack at noon- nuts  And /or grapes   Podiatrist appt was 9/14    Does not eat after 6 pm  Drinks mostly water   On elliptical - about 15-30 minutes   Glucose this am 109 Runs around low 100s  (loweste 91)   Later in the day 127  Very good !    BP Readings from Last 3 Encounters:  04/15/22 124/76  03/25/22 118/72  01/07/22 (!) 151/81   Patient Active Problem List   Diagnosis Date Noted   Personal history of colonic polyps    Polyp of cecum    Fecal occult blood test positive 10/23/2021   Yeast dermatitis 10/15/2021   Encounter for screening mammogram for breast cancer 10/04/2021   Hypergranulation 09/17/2021   Abscess of abdominal wall 08/16/2021   Hyperlipidemia associated with type 2 diabetes mellitus ([place]) 08/17/2018   Hypertrophic toenail 02/02/2018   Anxiety disorder 09/12/2015   Low HDL (under 40) 10/24/2014   Routine general medical examination at a health care facility 03/08/2014   History of endometrial cancer 12/15/2012   Colon cancer screening 11/01/2012   DM type 2 (diabetes mellitus, type 2) ([place]) 06/30/2012   Varicose veins of leg with swelling, left 11/18/2011   Class 2 obesity due to excess  calories with body mass index (BMI) of 38.0 to 38.9 in adult 11/12/2010   Essential hypertension 07/12/2009   Hypothyroidism (acquired) 05/16/2009   Past Medical History:  Diagnosis Date   Anxiety    Breast lesion    benign   Diabetes mellitus without complication (HCC)    Elevated transaminase level    ? fatty liver    History of endometrial cancer 12/15/2012   FIGO Grade 2 endometrioid adenocarcinoma with squamous differentiation involving the mucosa only.  From SGO Guidelines:  Recommended surveillance after treatment of endometrial cancer includes a follow-up visit every 3-6 months for 2 years, then every 6 months for 3 years, and annually thereafter. Each follow-up visit should include a thorough patient history; elicitation and investigation of any new symptoms associated with recurrence, such as vaginal bleeding, pelvic pain, weight loss, or lethargy; and a thorough speculum, pelvic, and rectovaginal examination   HTN (hypertension)    Hypothyroid    UTI (urinary tract infection)    Past Surgical History:  Procedure Laterality Date   ABDOMINAL HYSTERECTOMY     BREAST BIOPSY     benign cyst 2011   CATARACT EXTRACTION W/PHACO Right 01/07/2022   Procedure: CATARACT EXTRACTION PHACO AND  INTRAOCULAR LENS PLACEMENT ([facility]) RIGHT DIABETIC 8.04 01:01.7;  Surgeon: Birder Robson, MD;  Location: [place];  Service: Ophthalmology;  Laterality: Right;  Diabetic   CESAREAN SECTION     COLONOSCOPY WITH PROPOFOL N/A 11/20/2021   Procedure: COLONOSCOPY WITH PROPOFOL;  Surgeon: Lin Landsman, MD;  Location[facility];  Service: Gastroenterology;  Laterality: N/A;   INCISION AND DRAINAGE ABSCESS Right 08/16/2021   Procedure: INCISION AND DRAINAGE ABDOMINAL WALL ABSCESS;  Surgeon: Ronny Bacon, MD;  Location: [facility];  Service: General;  Laterality: Right;   LAPAROSCOPIC TOTAL HYSTERECTOMY  11/2012   Social History   Tobacco Use   Smoking status: Former    Types:  Cigarettes    Quit date: 07/22/1979    Years since quitting: 42.7    Passive exposure: Never   Smokeless tobacco: Never   Tobacco comments:    briefly smoked at 89.  (May occasionally have 1 cig)  Vaping Use   Vaping Use: Never used  Substance Use Topics   Alcohol use: Yes    Comment: rarely   Drug use: No   Family History  Problem Relation Age of Onset   Hypothyroidism Mother    Alzheimer's disease Mother    Stroke Father    Deep vein thrombosis Sister    Colon cancer Neg Hx    Esophageal cancer Neg Hx    Stomach cancer Neg Hx    Rectal cancer Neg Hx    Breast cancer Neg Hx    No Known Allergies Current Outpatient Medications on File Prior to Visit  Medication Sig Dispense Refill   atorvastatin (LIPITOR) 10 MG tablet Take 1 tablet (10 mg total) by mouth daily. In evening with a low fat snack 90 tablet 3   glimepiride (AMARYL) 4 MG tablet Take 1 tablet (4 mg total) by mouth daily before breakfast. 90 tablet 3   levothyroxine (SYNTHROID) 112 MCG tablet Take 1 tablet (112 mcg total) by mouth daily. 90 tablet 3   losartan (COZAAR) 100 MG tablet Take 0.5 tablets (50 mg total) by mouth daily. 45 tablet 3   metFORMIN (GLUCOPHAGE) 1000 MG tablet Take 1 tablet (1,000 mg total) by mouth 2 (two) times daily with a meal. 180 tablet 3   No current facility-administered medications on file prior to visit.     Review of Systems  Constitutional:  Negative for activity change, appetite change, fatigue, fever and unexpected weight change.  HENT:  Negative for congestion, ear pain, rhinorrhea, sinus pressure and sore throat.   Eyes:  Negative for pain, redness and visual disturbance.  Respiratory:  Negative for cough, shortness of breath and wheezing.   Cardiovascular:  Negative for chest pain and palpitations.  Gastrointestinal:  Negative for abdominal pain, blood in stool, constipation and diarrhea.  Endocrine: Negative for polydipsia and polyuria.  Genitourinary:  Negative for dysuria,  frequency and urgency.  Musculoskeletal:  Negative for arthralgias, back pain and myalgias.  Skin:  Negative for pallor and rash.  Allergic/Immunologic: Negative for environmental allergies.  Neurological:  Negative for dizziness, syncope and headaches.  Hematological:  Negative for adenopathy. Does not bruise/bleed easily.  Psychiatric/Behavioral:  Negative for decreased concentration and dysphoric mood. The patient is not nervous/anxious.        Objective:   Physical Exam Constitutional:      General: She is not in acute distress.    Appearance: Normal appearance. She is well-developed. She is obese. She is not ill-appearing or diaphoretic.  HENT:  Head: Normocephalic and atraumatic.  Eyes:     General: No scleral icterus.    Conjunctiva/sclera: Conjunctivae normal.     Pupils: Pupils are equal, round, and reactive to light.  Neck:     Thyroid: No thyromegaly.     Vascular: No carotid bruit or JVD.  Cardiovascular:     Rate and Rhythm: Normal rate and regular rhythm.     Heart sounds: Normal heart sounds.     No gallop.  Pulmonary:     Effort: Pulmonary effort is normal. No respiratory distress.     Breath sounds: Normal breath sounds. No wheezing or rales.  Abdominal:     General: There is no distension or abdominal bruit.     Palpations: Abdomen is soft.  Musculoskeletal:     Cervical back: Normal range of motion and neck supple.     Right lower leg: No edema.     Left lower leg: No edema.  Lymphadenopathy:     Cervical: No cervical adenopathy.  Skin:    General: Skin is warm and dry.     Coloration: Skin is not pale.     Findings: No rash.  Neurological:     Mental Status: She is alert.     Coordination: Coordination normal.     Deep Tendon Reflexes: Reflexes are normal and symmetric. Reflexes normal.  Psychiatric:        Mood and Affect: Mood normal.           Assessment & Plan:   Problem List Items Addressed This Visit       Endocrine   DM type  2 (diabetes mellitus, type 2) ([place]) - Primary    Lab Results  Component Value Date   HGBA1C 9.2 (H) 03/17/2022  Eating better now  Glucose avg low 100s in am and 120s in pm  Less carbs  Exercising up to 30 minutes daily  Few lb wt loss  Commended and urged to keep it up  Plan to continue  Metformin 1000 mg bid amaryl 4 mg daily   F/u in dec for visit and a1c

## 2022-04-15 NOTE — Patient Instructions (Signed)
Shingrix vaccine today    Keep up the great work with healthy diet and exercise  Weight loss will help also  Continue current medicines   Follow up in December

## 2022-04-15 NOTE — Assessment & Plan Note (Signed)
Lab Results  Component Value Date   HGBA1C 9.2 (H) 03/17/2022   Eating better now  Glucose avg low 100s in am and 120s in pm  Less carbs  Exercising up to 30 minutes daily  Few lb wt loss  Commended and urged to keep it up  Plan to continue  Metformin 1000 mg bid amaryl 4 mg daily   F/u in dec for visit and a1c

## 2022-06-23 ENCOUNTER — Ambulatory Visit (INDEPENDENT_AMBULATORY_CARE_PROVIDER_SITE_OTHER): Payer: 59 | Admitting: Family Medicine

## 2022-06-23 ENCOUNTER — Encounter: Payer: Self-pay | Admitting: Family Medicine

## 2022-06-23 VITALS — HR 79 | Temp 98.1°F | Ht 61.0 in | Wt 201.0 lb

## 2022-06-23 DIAGNOSIS — E1169 Type 2 diabetes mellitus with other specified complication: Secondary | ICD-10-CM | POA: Diagnosis not present

## 2022-06-23 DIAGNOSIS — E119 Type 2 diabetes mellitus without complications: Secondary | ICD-10-CM

## 2022-06-23 DIAGNOSIS — E785 Hyperlipidemia, unspecified: Secondary | ICD-10-CM

## 2022-06-23 LAB — POCT GLYCOSYLATED HEMOGLOBIN (HGB A1C): Hemoglobin A1C: 8.5 % — AB (ref 4.0–5.6)

## 2022-06-23 NOTE — Progress Notes (Signed)
Subjective:    Patient ID: Taylor Grimes, female    DOB: [birth year], 61 y.o.   MRN: [id number]  HPI Pt presents for f/u of DM2 and chronic medical problems   Wt Readings from Last 3 Encounters:  06/23/22 201 lb (91.2 kg)  04/15/22 206 lb 3.2 oz (93.5 kg)  03/25/22 207 lb 6 oz (94.1 kg)   37.98 kg/m  Doing ok  Has been taking care of grandkids - she enjoyed it teens and pre teens)   Taking care of herself  Down another 5 lb   Does not eat anything after 6 pm    HTN  bp is stable today  No cp or palpitations or headaches or edema  No side effects to medicines    Losartan 50 mg daily   DM2 Lab Results  Component Value Date   HGBA1C 9.2 (H) 03/17/2022   Started eating better and some exercise   Down to 8.5 today   Glucose from 95-125 Evenings 145 avg (4 pm) (tries to eat around 3 )   Doing well with this  Avoiding potatoes and bread  Smaller portions - stops eating earlier   Eating more green veggies  Salads, green peas  Protein from meat   Feels better  Walks outside and elliptical for exercise    Metformin 1000 mg bid Amaryl 4 mg daily   Had podiatry visit in mid sept   Hyperlipidemia Lab Results  Component Value Date   CHOL 189 03/17/2022   HDL 33.50 (L) 03/17/2022   LDLCALC 118 (H) 03/17/2022   LDLDIRECT 111.0 08/19/2019   TRIG 188.0 (H) 03/17/2022   CHOLHDL 6 03/17/2022   Atorvastatin 10 mg daily   Patient Active Problem List   Diagnosis Date Noted   Personal history of colonic polyps    Polyp of cecum    Fecal occult blood test positive 10/23/2021   Yeast dermatitis 10/15/2021   Encounter for screening mammogram for breast cancer 10/04/2021   Hypergranulation 09/17/2021   Abscess of abdominal wall 08/16/2021   Hyperlipidemia associated with type 2 diabetes mellitus ([place]) 08/17/2018   Hypertrophic toenail 02/02/2018   Anxiety disorder 09/12/2015   Low HDL (under 40) 10/24/2014   Routine general medical examination at a health  care facility 03/08/2014   History of endometrial cancer 12/15/2012   Colon cancer screening 11/01/2012   DM type 2 (diabetes mellitus, type 2) ([place]) 06/30/2012   Varicose veins of leg with swelling, left 11/18/2011   Class 2 obesity due to excess calories with body mass index (BMI) of 38.0 to 38.9 in adult 11/12/2010   Essential hypertension 07/12/2009   Hypothyroidism (acquired) 05/16/2009   Past Medical History:  Diagnosis Date   Anxiety    Breast lesion    benign   Diabetes mellitus without complication (HCC)    Elevated transaminase level    ? fatty liver    History of endometrial cancer 12/15/2012   FIGO Grade 2 endometrioid adenocarcinoma with squamous differentiation involving the mucosa only.  From SGO Guidelines:  Recommended surveillance after treatment of endometrial cancer includes a follow-up visit every 3-6 months for 2 years, then every 6 months for 3 years, and annually thereafter. Each follow-up visit should include a thorough patient history; elicitation and investigation of any new symptoms associated with recurrence, such as vaginal bleeding, pelvic pain, weight loss, or lethargy; and a thorough speculum, pelvic, and rectovaginal examination   HTN (hypertension)    Hypothyroid    UTI (urinary  tract infection)    Past Surgical History:  Procedure Laterality Date   ABDOMINAL HYSTERECTOMY     BREAST BIOPSY     benign cyst 2011   CATARACT EXTRACTION W/PHACO Right 01/07/2022   Procedure: CATARACT EXTRACTION PHACO AND INTRAOCULAR LENS PLACEMENT ([facility]) RIGHT DIABETIC 8.04 01:01.7;  Surgeon: Birder Robson, MD;  Location: [facility];  Service: Ophthalmology;  Laterality: Right;  Diabetic   CESAREAN SECTION     COLONOSCOPY WITH PROPOFOL N/A 11/20/2021   Procedure: COLONOSCOPY WITH PROPOFOL;  Surgeon: Lin Landsman, MD;  Location: [facility] ENDOSCOPY;  Service: Gastroenterology;  Laterality: N/A;   INCISION AND DRAINAGE ABSCESS Right 08/16/2021   Procedure:  INCISION AND DRAINAGE ABDOMINAL WALL ABSCESS;  Surgeon: Ronny Bacon, MD;  Location: [facility];  Service: General;  Laterality: Right;   LAPAROSCOPIC TOTAL HYSTERECTOMY  11/2012   Social History   Tobacco Use   Smoking status: Former    Types: Cigarettes    Quit date: 07/22/1979    Years since quitting: 42.9    Passive exposure: Never   Smokeless tobacco: Never   Tobacco comments:    briefly smoked at 7.  (May occasionally have 1 cig)  Vaping Use   Vaping Use: Never used  Substance Use Topics   Alcohol use: Yes    Comment: rarely   Drug use: No   Family History  Problem Relation Age of Onset   Hypothyroidism Mother    Alzheimer's disease Mother    Stroke Father    Deep vein thrombosis Sister    Colon cancer Neg Hx    Esophageal cancer Neg Hx    Stomach cancer Neg Hx    Rectal cancer Neg Hx    Breast cancer Neg Hx    No Known Allergies Current Outpatient Medications on File Prior to Visit  Medication Sig Dispense Refill   atorvastatin (LIPITOR) 10 MG tablet Take 1 tablet (10 mg total) by mouth daily. In evening with a low fat snack 90 tablet 3   glimepiride (AMARYL) 4 MG tablet Take 1 tablet (4 mg total) by mouth daily before breakfast. 90 tablet 3   levothyroxine (SYNTHROID) 112 MCG tablet Take 1 tablet (112 mcg total) by mouth daily. 90 tablet 3   losartan (COZAAR) 100 MG tablet Take 0.5 tablets (50 mg total) by mouth daily. 45 tablet 3   metFORMIN (GLUCOPHAGE) 1000 MG tablet Take 1 tablet (1,000 mg total) by mouth 2 (two) times daily with a meal. 180 tablet 3   No current facility-administered medications on file prior to visit.     Review of Systems  Constitutional:  Negative for activity change, appetite change, fatigue, fever and unexpected weight change.  HENT:  Negative for congestion, ear pain, rhinorrhea, sinus pressure and sore throat.   Eyes:  Negative for pain, redness and visual disturbance.  Respiratory:  Negative for cough, shortness of breath and  wheezing.   Cardiovascular:  Negative for chest pain and palpitations.  Gastrointestinal:  Negative for abdominal pain, blood in stool, constipation and diarrhea.  Endocrine: Negative for polydipsia and polyuria.  Genitourinary:  Negative for dysuria, frequency and urgency.  Musculoskeletal:  Negative for arthralgias, back pain and myalgias.  Skin:  Negative for pallor and rash.  Allergic/Immunologic: Negative for environmental allergies.  Neurological:  Negative for dizziness, syncope and headaches.  Hematological:  Negative for adenopathy. Does not bruise/bleed easily.  Psychiatric/Behavioral:  Negative for decreased concentration and dysphoric mood. The patient is not nervous/anxious.  Objective:   Physical Exam Constitutional:      General: She is not in acute distress.    Appearance: Normal appearance. She is well-developed. She is obese. She is not ill-appearing or diaphoretic.  HENT:     Head: Normocephalic and atraumatic.  Eyes:     Conjunctiva/sclera: Conjunctivae normal.     Pupils: Pupils are equal, round, and reactive to light.  Neck:     Thyroid: No thyromegaly.     Vascular: No carotid bruit or JVD.  Cardiovascular:     Rate and Rhythm: Normal rate and regular rhythm.     Heart sounds: Normal heart sounds.     No gallop.  Pulmonary:     Effort: Pulmonary effort is normal. No respiratory distress.     Breath sounds: Normal breath sounds. No wheezing or rales.  Abdominal:     General: There is no distension or abdominal bruit.     Palpations: Abdomen is soft.  Musculoskeletal:     Cervical back: Normal range of motion and neck supple.     Right lower leg: No edema.     Left lower leg: No edema.  Lymphadenopathy:     Cervical: No cervical adenopathy.  Skin:    General: Skin is warm and dry.     Coloration: Skin is not pale.     Findings: No rash.  Neurological:     Mental Status: She is alert.     Coordination: Coordination normal.     Deep Tendon  Reflexes: Reflexes are normal and symmetric. Reflexes normal.  Psychiatric:        Mood and Affect: Mood normal.           Assessment & Plan:   Problem List Items Addressed This Visit       Endocrine   DM type 2 (diabetes mellitus, type 2) ([place]) - Primary    Some improvement with better diet  Lab Results  Component Value Date   HGBA1C 8.5 (A) 06/23/2022  Her glucose readings are good am and 4 pm Asked her to check at different times of day and keep a log (along with what she eats) Strongly enc 30 or more minutes of exercise daily  Will continue Metformin 1000 mg bid  Amaryl 4 mg daily  She will get [place] her log in 2 wk and based on that may raise amaryl dose  Will keep working on low glycemic diet  Statin and arb Eye care utd  F/u 3 mo for annual exam      Relevant Orders   POCT glycosylated hemoglobin (Hb A1C) (Completed)   Hyperlipidemia associated with type 2 diabetes mellitus ([place])    Disc goals for lipids and reasons to control them Rev last labs with pt Rev low sat fat diet in detail Last LDL was not at goal  On atorvastatin 10  Now diet is improved Will re check this in 3 mo and consider inc dose

## 2022-06-23 NOTE — Patient Instructions (Addendum)
Check some glucose readings at random times - mid day, before bed  Keep a log and note with each reading what you ate prior and what you ate before that)  I want to get a feeling    Protein- meat, fish , dairy products, dried beans, soy, nuts and nut butters  Try to get most of your carbohydrates from produce (with the exception of white potatoes)  Eat less bread/pasta/rice/snack foods/cereals/sweets and other items from the middle of the grocery store (processed carbs)  Aim for 30 or more minutes of exercise per day- increase this as you get in better shape   We may go up on your amaryl (glimiperide) dose  Get [place] some blood sugar readings in 2 weeks Keep a log - drop it off or mail/ email to [place]  Depending on that we may change the dose   Keep working on diet and exercise !

## 2022-06-23 NOTE — Assessment & Plan Note (Signed)
Disc goals for lipids and reasons to control them Rev last labs with pt Rev low sat fat diet in detail Last LDL was not at goal  On atorvastatin 10  Now diet is improved Will re check this in 3 mo and consider inc dose

## 2022-06-23 NOTE — Assessment & Plan Note (Signed)
Some improvement with better diet  Lab Results  Component Value Date   HGBA1C 8.5 (A) 06/23/2022   Her glucose readings are good am and 4 pm Asked her to check at different times of day and keep a log (along with what she eats) Strongly enc 30 or more minutes of exercise daily  Will continue Metformin 1000 mg bid  Amaryl 4 mg daily  She will get [place] her log in 2 wk and based on that may raise amaryl dose  Will keep working on low glycemic diet  Statin and arb Eye care utd  F/u 3 mo for annual exam

## 2022-06-26 ENCOUNTER — Encounter: Payer: Self-pay | Admitting: Internal Medicine

## 2022-06-26 ENCOUNTER — Ambulatory Visit (INDEPENDENT_AMBULATORY_CARE_PROVIDER_SITE_OTHER): Payer: 59 | Admitting: Internal Medicine

## 2022-06-26 VITALS — BP 122/70 | HR 80 | Ht 61.0 in | Wt 201.6 lb

## 2022-06-26 DIAGNOSIS — E039 Hypothyroidism, unspecified: Secondary | ICD-10-CM

## 2022-06-26 LAB — TSH: TSH: 0.6 u[IU]/mL (ref 0.35–5.50)

## 2022-06-26 LAB — T4, FREE: Free T4: 1.34 ng/dL (ref 0.60–1.60)

## 2022-06-26 NOTE — Progress Notes (Signed)
Patient ID: Taylor Grimes, female   DOB: 10/07/1960, 61 y.o.   MRN: [id number]   HPI  Taylor Grimes is a 61 y.o.-year-old female, returning for f/u for hypothyroidism, dx 2012. Last visit 1 year ago. Her sister, Taylor Grimes, was also my pt. - she now moved to the [place].  Interim hx.: She retired since last [place]. No more stress, fatigue. She had abd. Wall cellulitis from an infected inguinal abscess >> had surgery.  Now resolved. She lost weight since last OV -another 15 lbs. She does not eat after 6 PM, also reduced starches.  Pt is on levothyroxine 112  mcg daily, taken: - in am - fasting - at least 30 min from b'fast - + calcium in the evening, + occasional Tums - no iron - no multivitamins - no PPIs - not on Biotin  TFTs reviewed: Lab Results  Component Value Date   TSH 0.37 03/17/2022   TSH 0.28 (L) 12/05/2021   TSH 0.08 (L) 09/06/2021   TSH 0.39 06/27/2021   TSH 0.42 09/07/2020   TSH 0.52 06/21/2020   TSH 3.59 08/19/2019   TSH 1.07 03/10/2019   TSH 7.75 (H) 08/02/2018   TSH 2.52 03/08/2018   FREET4 1.59 03/17/2022   FREET4 1.57 12/05/2021   FREET4 1.58 06/27/2021   FREET4 1.25 06/21/2020   FREET4 1.45 03/10/2019   FREET4 1.01 03/08/2018   FREET4 1.15 02/24/2017   FREET4 1.44 01/14/2016   FREET4 1.60 09/28/2014   FREET4 1.14 05/04/2014   Pt denies: - feeling nodules in neck - hoarseness - dysphagia - choking Before last visit, she was waking up at 3 am to go to work and open [employer] at 4-7 am.  She limited her work days to 32 h per week. Her fatigue improved.  However, her fatigue improved significantly after retiring.  No FH of thyroid cancer. However, all women in her family: hypothyroidism. No FH of thyroid cancer. No h/o radiation tx to head or neck. No herbal supplements. No Biotin use. No recent steroids use.   ROS: + see HPI  I reviewed pt's medications, allergies, PMH, social hx, family hx, and changes were documented in the history of present  illness. Otherwise, unchanged from my initial visit note.  Past Medical History:  Diagnosis Date   Anxiety    Breast lesion    benign   Diabetes mellitus without complication (HCC)    Elevated transaminase level    ? fatty liver    History of endometrial cancer 12/15/2012   FIGO Grade 2 endometrioid adenocarcinoma with squamous differentiation involving the mucosa only.  From SGO Guidelines:  Recommended surveillance after treatment of endometrial cancer includes a follow-up visit every 3-6 months for 2 years, then every 6 months for 3 years, and annually thereafter. Each follow-up visit should include a thorough patient history; elicitation and investigation of any new symptoms associated with recurrence, such as vaginal bleeding, pelvic pain, weight loss, or lethargy; and a thorough speculum, pelvic, and rectovaginal examination   HTN (hypertension)    Hypothyroid    UTI (urinary tract infection)    Past Surgical History:  Procedure Laterality Date   ABDOMINAL HYSTERECTOMY     BREAST BIOPSY     benign cyst 2011   CATARACT EXTRACTION W/PHACO Right 01/07/2022   Procedure: CATARACT EXTRACTION PHACO AND INTRAOCULAR LENS PLACEMENT ([place]) RIGHT DIABETIC 8.04 01:01.7;  Surgeon: Birder Robson, MD;  Location: [facility];  Service: Ophthalmology;  Laterality: Right;  Diabetic   CESAREAN  SECTION     COLONOSCOPY WITH PROPOFOL N/A 11/20/2021   Procedure: COLONOSCOPY WITH PROPOFOL;  Surgeon: Lin Landsman, MD;  Location: [facility] ENDOSCOPY;  Service: Gastroenterology;  Laterality: N/A;   INCISION AND DRAINAGE ABSCESS Right 08/16/2021   Procedure: INCISION AND DRAINAGE ABDOMINAL WALL ABSCESS;  Surgeon: Ronny Bacon, MD;  Location: [facility];  Service: General;  Laterality: Right;   LAPAROSCOPIC TOTAL HYSTERECTOMY  11/2012   Social History   Socioeconomic History   Marital status: Legally Separated    Spouse name: Not on file   Number of children: 2   Years of education: Not on file    Highest education level: Not on file  Occupational History   Occupation: shift Freight forwarder     Employer: [employer]  Tobacco Use   Smoking status: Former    Types: Cigarettes    Quit date: 07/22/1979    Years since quitting: 42.9    Passive exposure: Never   Smokeless tobacco: Never   Tobacco comments:    briefly smoked at 84.  (May occasionally have 1 cig)  Vaping Use   Vaping Use: Never used  Substance and Sexual Activity   Alcohol use: Yes    Comment: rarely   Drug use: No   Sexual activity: Yes    Birth control/protection: Post-menopausal  Other Topics Concern   Not on file  Social History Narrative   Not on file   Social Determinants of Health   Financial Resource Strain: Not on file  Food Insecurity: Not on file  Transportation Needs: Not on file  Physical Activity: Not on file  Stress: Not on file  Social Connections: Not on file  Intimate Partner Violence: Not on file   Current Outpatient Medications on File Prior to Visit  Medication Sig Dispense Refill   atorvastatin (LIPITOR) 10 MG tablet Take 1 tablet (10 mg total) by mouth daily. In evening with a low fat snack 90 tablet 3   glimepiride (AMARYL) 4 MG tablet Take 1 tablet (4 mg total) by mouth daily before breakfast. 90 tablet 3   levothyroxine (SYNTHROID) 112 MCG tablet Take 1 tablet (112 mcg total) by mouth daily. 90 tablet 3   losartan (COZAAR) 100 MG tablet Take 0.5 tablets (50 mg total) by mouth daily. 45 tablet 3   metFORMIN (GLUCOPHAGE) 1000 MG tablet Take 1 tablet (1,000 mg total) by mouth 2 (two) times daily with a meal. 180 tablet 3   No current facility-administered medications on file prior to visit.   No Known Allergies Family History  Problem Relation Age of Onset   Hypothyroidism Mother    Alzheimer's disease Mother    Stroke Father    Deep vein thrombosis Sister    Colon cancer Neg Hx    Esophageal cancer Neg Hx    Stomach cancer Neg Hx    Rectal cancer Neg Hx    Breast  cancer Neg Hx    PE: BP 122/70   Pulse 80   Ht '5\' 1"'$  (1.549 m)   Wt 201 lb 9.6 oz (91.4 kg)   LMP 07/21/2008   SpO2 98%   BMI 38.09 kg/m  Wt Readings from Last 3 Encounters:  06/26/22 201 lb 9.6 oz (91.4 kg)  06/23/22 201 lb (91.2 kg)  04/15/22 206 lb 3.2 oz (93.5 kg)  Constitutional: overweight, in NAD Eyes:  EOMI, no exophthalmos ENT: no neck masses, no cervical lymphadenopathy Cardiovascular: RRR, No MRG Respiratory: CTA B Musculoskeletal: no deformities Skin:no rashes Neurological: + Very mild  tremor with outstretched hands  ASSESSMENT: 1. Hypothyroidism  PLAN:  1. Patient with acquired hypothyroidism, on levothyroxine. - latest thyroid labs reviewed with pt. >> normal: Lab Results  Component Value Date   TSH 0.37 03/17/2022  - she continues on LT4 112 mcg daily - pt feels good on this dose.  She lost 15 pounds since last visit.  Due to the weight loss, we discussed that she may start requiring less LT4.   - we discussed about taking the thyroid hormone every day, with water, >30 minutes before breakfast, separated by >4 hours from acid reflux medications, calcium, iron, multivitamins. Pt. is taking it correctly. - will check thyroid tests today: TSH and fT4 - If labs are abnormal, she will need to return for repeat TFTs in 1.5 months - I will see her back in a year  She has a new 112 mcg batch at home.  Component     Latest Ref Rng 06/26/2022  TSH     0.35 - 5.50 uIU/mL 0.60   T4,Free(Direct)     0.60 - 1.60 ng/dL 1.34   Thyroid tests are normal.  We can continue the same levothyroxine dose for now.  Philemon Kingdom, MD PhD [facility] Endocrinology

## 2022-06-26 NOTE — Patient Instructions (Addendum)
Please continue Levothyroxine 112 mcg daily.  Take the thyroid hormone every day, with water, at least 30 minutes before breakfast, separated by at least 4 hours from: - acid reflux medications - calcium - iron - multivitamins  Please stop at the lab.  Please come back for a follow-up appointment in 1 year, but sooner for labs, if you continue to lose weight.

## 2022-07-03 ENCOUNTER — Ambulatory Visit (INDEPENDENT_AMBULATORY_CARE_PROVIDER_SITE_OTHER): Payer: 59 | Admitting: Podiatry

## 2022-07-03 DIAGNOSIS — E119 Type 2 diabetes mellitus without complications: Secondary | ICD-10-CM

## 2022-07-03 DIAGNOSIS — M79674 Pain in right toe(s): Secondary | ICD-10-CM | POA: Diagnosis not present

## 2022-07-03 DIAGNOSIS — M79675 Pain in left toe(s): Secondary | ICD-10-CM | POA: Diagnosis not present

## 2022-07-03 DIAGNOSIS — B351 Tinea unguium: Secondary | ICD-10-CM

## 2022-07-03 NOTE — Progress Notes (Signed)
Complaint:  Visit Type: Patient returns to my office for continued preventative foot care services. Complaint: Patient states" my nails have grown long and thick and become painful to walk and wear shoes" Patient has been diagnosed with DM with no foot complications. The patient presents for preventative foot care services. No changes to ROS  Podiatric Exam: Vascular: dorsalis pedis and posterior tibial pulses are palpable bilateral. Capillary return is immediate. Temperature gradient is WNL. Skin turgor WNL  Sensorium: Normal Semmes Weinstein monofilament test. Normal tactile sensation bilaterally. Nail Exam: Pt has thick disfigured discolored nails with subungual debris noted bilateral entire nail hallux through fifth toenails Ulcer Exam: There is no evidence of ulcer or pre-ulcerative changes or infection. Orthopedic Exam: Muscle tone and strength are WNL. No limitations in general ROM. No crepitus or effusions noted. Foot type and digits show no abnormalities. Bony prominences are unremarkable. Skin:  Porokeratosis asymptomatic.Marland Kitchen No infection or ulcers.    Diagnosis:  Onychomycosis, , Pain in right toe, pain in left toes.   Treatment & Plan Procedures and Treatment: Consent by patient was obtained for treatment procedures.   Debridement of mycotic and hypertrophic toenails, 1 through 5 bilateral and clearing of subungual debris. With nail nipper and dremel tool. No ulceration, no infection noted.  Return Visit-Office Procedure: Patient instructed to return to the office for a follow up visit 3 months for continued evaluation and treatment.    Gardiner Barefoot DPM

## 2022-07-08 DIAGNOSIS — Z8541 Personal history of malignant neoplasm of cervix uteri: Secondary | ICD-10-CM | POA: Diagnosis not present

## 2022-07-08 DIAGNOSIS — E039 Hypothyroidism, unspecified: Secondary | ICD-10-CM | POA: Diagnosis not present

## 2022-07-08 DIAGNOSIS — E11630 Type 2 diabetes mellitus with periodontal disease: Secondary | ICD-10-CM | POA: Diagnosis not present

## 2022-07-08 DIAGNOSIS — I10 Essential (primary) hypertension: Secondary | ICD-10-CM | POA: Diagnosis not present

## 2022-07-08 DIAGNOSIS — E785 Hyperlipidemia, unspecified: Secondary | ICD-10-CM | POA: Diagnosis not present

## 2022-07-08 DIAGNOSIS — Z87891 Personal history of nicotine dependence: Secondary | ICD-10-CM | POA: Diagnosis not present

## 2022-07-08 DIAGNOSIS — Z7984 Long term (current) use of oral hypoglycemic drugs: Secondary | ICD-10-CM | POA: Diagnosis not present

## 2022-07-08 DIAGNOSIS — Z6837 Body mass index (BMI) 37.0-37.9, adult: Secondary | ICD-10-CM | POA: Diagnosis not present

## 2022-07-08 DIAGNOSIS — K5900 Constipation, unspecified: Secondary | ICD-10-CM | POA: Diagnosis not present

## 2022-07-20 ENCOUNTER — Other Ambulatory Visit: Payer: Self-pay | Admitting: Internal Medicine

## 2022-07-24 ENCOUNTER — Telehealth: Payer: Self-pay | Admitting: Family Medicine

## 2022-07-24 MED ORDER — GLIMEPIRIDE 4 MG PO TABS
6.0000 mg | ORAL_TABLET | Freq: Every day | ORAL | 2 refills | Status: DC
Start: 2022-07-24 — End: 2022-10-21

## 2022-07-24 NOTE — Addendum Note (Signed)
Addended by: Loura Pardon A on: 07/24/2022 10:49 AM   Modules accepted: Orders

## 2022-07-24 NOTE — Telephone Encounter (Signed)
Pt notified of Dr. Marliss Coots comments and instructions and verbalized understanding. Pt will increase med to 1.5 tabs daily and keep f/u

## 2022-07-24 NOTE — Telephone Encounter (Signed)
Many of her glucose readings are over 170  I appreciate the info and want her to continue to work on low glycemic diet and exercise as tolerated   Go up on amaryl to 6 mg daily (that is 1 1 /2 of the 4 mg pills) Watch for low blood sugar (if she does let me know)   Follow up as planned  We may go up to 8 mg next time depending on how she does

## 2022-07-24 NOTE — Telephone Encounter (Signed)
It's a letter with what she has eaten with BS levels, letter placed in your inbox

## 2022-07-24 NOTE — Telephone Encounter (Signed)
Patient came by and dropped off some papers that Dr. Glori Bickers needed. Placed in her box.

## 2022-08-19 ENCOUNTER — Other Ambulatory Visit: Payer: Self-pay | Admitting: Family Medicine

## 2022-08-28 ENCOUNTER — Telehealth: Payer: Self-pay | Admitting: Pharmacist

## 2022-08-28 NOTE — Progress Notes (Signed)
Contacted patient for medication review in light of recent transition to Managed Medicaid. Patient verbalizes agreement and phone visit scheduled for next Friday.   Catie Hedwig Morton, PharmD, [place], [facility] [phone]

## 2022-09-03 DIAGNOSIS — H2512 Age-related nuclear cataract, left eye: Secondary | ICD-10-CM | POA: Diagnosis not present

## 2022-09-03 LAB — HM DIABETES EYE EXAM

## 2022-09-05 ENCOUNTER — Other Ambulatory Visit: Payer: 59 | Admitting: Pharmacist

## 2022-09-05 NOTE — Progress Notes (Signed)
09/05/2022 Name: BHARGAVI WALLOCK MRN: [id number] DOB: [birth year]  Chief Complaint  Patient presents with   Medication Management   Diabetes    Mildreth PEJA EBLIN is a 62 y.o. year old female who presented for a telephone visit.   They were referred to the pharmacist by a quality report for assistance in managing diabetes.   Patient is participating in a Managed Medicaid Plan:  Yes  Subjective:  Care Team: Primary Care Provider: Abner Greenspan, MD ; Next Scheduled Visit: 09/22/22  Medication Access/Adherence  Current Pharmacy:  [facility] [phone] [address], [place] [address] Phone: [phone] Fax: Bertram Thornton, [address] [place] [address] Phone: [phone] Fax: [phone]  [id number] [id number]: [phone]: [phone]  Patient reports affordability concerns with their medications: No  Patient reports access/transportation concerns to their pharmacy: No  Patient reports adherence concerns with their medications:  No      Diabetes:  Current medications: metformin 1000 mg twice daily, glimepiride 6 mg (1.5 tablets of 4 mg)  First diagnosed 2013-2014. Has never been on anything   Current glucose readings: fastings: 115; is not checking later in the day lately, but historically was 200s  Patient denies hypoglycemic s/sx including dizziness, shakiness, sweating. Patient denies hyperglycemic symptoms including polyuria, polydipsia, polyphagia, nocturia, neuropathy, blurred vision.  Current meal patterns:  - Breakfast: boiled egg, bacon; 1 piece of toast; water - Lunch: snacks - orange, apple, pack of nabs; isn't too hungry but  - Supper: salad, vegetables; once in a while will have protein; occasionally she cooks chicken, pork chop; cooks in the [employer]- Snacks: avoids desserts;  notes her boyfriend bought her chocolates for Valentine's day and she made him take them back, she knew she would eat the whole thing - Drinks: water; infrequent tropical   Current physical activity: has an elliptical (2x weekly for 10 minutes); sometimes will walk to the mailbox (daily);   Hypertension:  Current medications: losartan 50 mg (1/2 of 100 mg tablet)  Patient does not have a validated, automated, upper arm home BP cuff  Patient denies hypotensive s/sx including dizziness, lightheadedness.  Patient denies hypertensive symptoms including headache, chest pain, shortness of breath  Hyperlipidemia/ASCVD Risk Reduction  Current lipid lowering medications: atorvastatin 10 mg daily  Reports a sister passed from an MI at age 64 PREVENT Risk Score: 10 year risk of CVD: 14.4% - 10 year risk of ASCVD: 9% - 10 year risk of HF: 11.1%   Hypothyroidism: Current medications: levothyroxine 112 mcg daily   Objective:  Lab Results  Component Value Date   HGBA1C 8.5 (A) 06/23/2022    Lab Results  Component Value Date   CREATININE 0.81 03/17/2022   BUN 12 03/17/2022   NA 138 03/17/2022   K 4.1 03/17/2022   CL 103 03/17/2022   CO2 27 03/17/2022    Lab Results  Component Value Date   CHOL 189 03/17/2022   HDL 33.50 (L) 03/17/2022   LDLCALC 118 (H) 03/17/2022   LDLDIRECT 111.0 08/19/2019   TRIG 188.0 (H) 03/17/2022   CHOLHDL 6 03/17/2022    Medications Reviewed Today     Reviewed by HOsker Mason RPH-CPP (Pharmacist) on 09/05/22 at 1006  Med List Status: <None>   Medication Order Taking? Sig  Documenting Provider Last Dose Status Informant  atorvastatin (LIPITOR) 10 MG tablet [id number] Yes Take 1 tablet (10 mg total) by mouth daily. In evening with a low fat snack Tower, Wynelle Fanny, MD Taking Active   glimepiride (AMARYL) 4 MG tablet [id number] Yes Take 1.5 tablets (6 mg total) by mouth daily before breakfast. Tower, Wynelle Fanny, MD Taking Active   levothyroxine  (SYNTHROID) 112 MCG tablet [id number] Yes TAKE 1 TABLET BY MOUTH ONCE DAILY Philemon Kingdom, MD Taking Active   losartan (COZAAR) 100 MG tablet [id number] Yes TAKE 1/2 TABLET BY MOUTH DAILY Tower, Wynelle Fanny, MD Taking Active   metFORMIN (GLUCOPHAGE) 1000 MG tablet [id number] Yes Take 1 tablet (1,000 mg total) by mouth 2 (two) times daily with a meal. Tower, Wynelle Fanny, MD Taking Active               Assessment/Plan:   Medication Management: - 90 day supplies are the same copay as 30 day on Managed Medicaid plans, recommend to send refills for 90 days moving forward.   Diabetes: - Currently uncontrolled - Reviewed long term cardiovascular and renal outcomes of uncontrolled blood sugar - Reviewed goal A1c, goal fasting, and goal 2 hour post prandial glucose - Reviewed dietary modifications including: focus on lean proteins, vegetables and fruits, whole grains. Praised for focus on moderating sugar intake - Reviewed lifestyle modifications including: setting attainable goals to increase physical activity on a weekly basis - Counseled on GLP1, as Medicaid will cover Ozempic or Trulicity. Discussed mechanism of action, side effects, cardiometabolic benefits. Patient is interested in discussing with PCP at upcoming appointment. Discussed that glimepiride is not a preferred agent when other agents are equally affordable - Recommend to check glucose twice daily, fasting and 2 hour post prandial  Hypertension: - Currently controlled - Reviewed long term cardiovascular and renal outcomes of uncontrolled blood pressure - Reviewed appropriate blood pressure monitoring technique and reviewed goal blood pressure. Recommended to check home blood pressure and heart rate periodically. Will pursue if DME supplier in [place] can still bill Medicaid for DME - Recommend to continue current regimen   Hyperlipidemia/ASCVD Risk Reduction: - Currently uncontrolled, as LDL goal of <70 would be recommended for  patients with DM, especially considering family risk.  - Reviewed dietary recommendations including praised for baking, avoiding high fat meats - Recommend to increase atorvastatin to 40 mg daily. Encouraged patient to discuss with PCP at next appointment.   Will also connect with case management to support with disease state management and resource review.  Follow Up Plan: phone call in 6 weeks  Catie TJodi Mourning, PharmD, [place], [facility] [phone]

## 2022-09-05 NOTE — Patient Instructions (Addendum)
Posey,   It was great talking to you today!  Talk to Dr. Glori Bickers about the weekly injectable medications for diabetes. I think it could be a better option for you in the long run instead of the glimepiride, now that you have Medicaid.   Check your blood sugars twice daily:  1) Fasting, first thing in the morning before breakfast and  2) 2 hours after your largest meal.   For a goal A1c of less than 7%, goal fasting readings are less than 130 and goal 2 hour after meal readings are less than 180.    I'll see what local pharmacies can bill Medicaid for a blood pressure machine.   Keep up the fantastic work!  Catie Hedwig Morton, PharmD, [place], [facility] [phone]

## 2022-09-12 ENCOUNTER — Encounter: Payer: Self-pay | Admitting: Pharmacist

## 2022-09-14 ENCOUNTER — Telehealth: Payer: Self-pay | Admitting: Family Medicine

## 2022-09-14 DIAGNOSIS — E039 Hypothyroidism, unspecified: Secondary | ICD-10-CM

## 2022-09-14 DIAGNOSIS — I10 Essential (primary) hypertension: Secondary | ICD-10-CM

## 2022-09-14 DIAGNOSIS — E119 Type 2 diabetes mellitus without complications: Secondary | ICD-10-CM

## 2022-09-14 DIAGNOSIS — E1169 Type 2 diabetes mellitus with other specified complication: Secondary | ICD-10-CM

## 2022-09-14 NOTE — Telephone Encounter (Signed)
-----   Message from Velna Hatchet, RT sent at 09/02/2022 11:52 AM EST ----- Regarding: Mon 2/26 lab Future lab orders for 3 month follow up needed, please.  Thanks, Anda Kraft

## 2022-09-15 ENCOUNTER — Other Ambulatory Visit (INDEPENDENT_AMBULATORY_CARE_PROVIDER_SITE_OTHER): Payer: 59

## 2022-09-15 DIAGNOSIS — I10 Essential (primary) hypertension: Secondary | ICD-10-CM | POA: Diagnosis not present

## 2022-09-15 DIAGNOSIS — E785 Hyperlipidemia, unspecified: Secondary | ICD-10-CM

## 2022-09-15 DIAGNOSIS — E119 Type 2 diabetes mellitus without complications: Secondary | ICD-10-CM

## 2022-09-15 DIAGNOSIS — E1169 Type 2 diabetes mellitus with other specified complication: Secondary | ICD-10-CM | POA: Diagnosis not present

## 2022-09-15 DIAGNOSIS — E039 Hypothyroidism, unspecified: Secondary | ICD-10-CM

## 2022-09-15 LAB — TSH: TSH: 0.43 u[IU]/mL (ref 0.35–5.50)

## 2022-09-15 LAB — COMPREHENSIVE METABOLIC PANEL
ALT: 23 U/L (ref 0–35)
AST: 30 U/L (ref 0–37)
Albumin: 3.5 g/dL (ref 3.5–5.2)
Alkaline Phosphatase: 104 U/L (ref 39–117)
BUN: 13 mg/dL (ref 6–23)
CO2: 28 mEq/L (ref 19–32)
Calcium: 9.6 mg/dL (ref 8.4–10.5)
Chloride: 104 mEq/L (ref 96–112)
Creatinine, Ser: 0.81 mg/dL (ref 0.40–1.20)
GFR: 77.14 mL/min (ref >60.00)
Glucose, Bld: 209 mg/dL — ABNORMAL HIGH (ref 70–99)
Potassium: 4.2 mEq/L (ref 3.5–5.1)
Sodium: 142 mEq/L (ref 135–145)
Total Bilirubin: 1.1 mg/dL (ref 0.2–1.2)
Total Protein: 6.7 g/dL (ref 6.0–8.3)

## 2022-09-15 LAB — CBC WITH DIFFERENTIAL/PLATELET
Basophils Absolute: 0.1 10*3/uL (ref 0.0–0.1)
Basophils Relative: 1.3 % (ref 0.0–3.0)
Eosinophils Absolute: 0.8 10*3/uL — ABNORMAL HIGH (ref 0.0–0.7)
Eosinophils Relative: 9.6 % — ABNORMAL HIGH (ref 0.0–5.0)
HCT: 39.7 % (ref 36.0–46.0)
Hemoglobin: 13.5 g/dL (ref 12.0–15.0)
Lymphocytes Relative: 34.3 % (ref 12.0–46.0)
Lymphs Abs: 2.7 10*3/uL (ref 0.7–4.0)
MCHC: 34.1 g/dL (ref 30.0–36.0)
MCV: 91.8 fl (ref 78.0–100.0)
Monocytes Absolute: 0.6 10*3/uL (ref 0.1–1.0)
Monocytes Relative: 7.6 % (ref 3.0–12.0)
Neutro Abs: 3.7 10*3/uL (ref 1.4–7.7)
Neutrophils Relative %: 47.2 % (ref 43.0–77.0)
Platelets: 151 10*3/uL (ref 150.0–400.0)
RBC: 4.32 Mil/uL (ref 3.87–5.11)
RDW: 13.3 % (ref 11.5–15.5)
WBC: 7.9 10*3/uL (ref 4.0–10.5)

## 2022-09-15 LAB — LIPID PANEL
Cholesterol: 191 mg/dL (ref 0–200)
HDL: 34.1 mg/dL — ABNORMAL LOW (ref >39.00)
LDL Cholesterol: 118 mg/dL — ABNORMAL HIGH (ref 0–99)
NonHDL: 156.67
Total CHOL/HDL Ratio: 6
Triglycerides: 193 mg/dL — ABNORMAL HIGH (ref 0.0–149.0)
VLDL: 38.6 mg/dL (ref 0.0–40.0)

## 2022-09-15 LAB — HEMOGLOBIN A1C: Hgb A1c MFr Bld: 8.5 % — ABNORMAL HIGH (ref 4.6–6.5)

## 2022-09-17 DIAGNOSIS — H2512 Age-related nuclear cataract, left eye: Secondary | ICD-10-CM | POA: Diagnosis not present

## 2022-09-22 ENCOUNTER — Other Ambulatory Visit: Payer: 59 | Admitting: *Deleted

## 2022-09-22 ENCOUNTER — Encounter: Payer: Self-pay | Admitting: *Deleted

## 2022-09-22 ENCOUNTER — Encounter: Payer: Self-pay | Admitting: Family Medicine

## 2022-09-22 ENCOUNTER — Ambulatory Visit (INDEPENDENT_AMBULATORY_CARE_PROVIDER_SITE_OTHER): Payer: 59 | Admitting: Family Medicine

## 2022-09-22 VITALS — BP 132/70 | HR 75 | Temp 97.9°F | Ht 61.25 in | Wt 196.4 lb

## 2022-09-22 DIAGNOSIS — E785 Hyperlipidemia, unspecified: Secondary | ICD-10-CM

## 2022-09-22 DIAGNOSIS — E039 Hypothyroidism, unspecified: Secondary | ICD-10-CM

## 2022-09-22 DIAGNOSIS — Z6836 Body mass index (BMI) 36.0-36.9, adult: Secondary | ICD-10-CM

## 2022-09-22 DIAGNOSIS — Z1211 Encounter for screening for malignant neoplasm of colon: Secondary | ICD-10-CM | POA: Diagnosis not present

## 2022-09-22 DIAGNOSIS — Z0000 Encounter for general adult medical examination without abnormal findings: Secondary | ICD-10-CM | POA: Diagnosis not present

## 2022-09-22 DIAGNOSIS — E1169 Type 2 diabetes mellitus with other specified complication: Secondary | ICD-10-CM

## 2022-09-22 DIAGNOSIS — E119 Type 2 diabetes mellitus without complications: Secondary | ICD-10-CM

## 2022-09-22 DIAGNOSIS — I10 Essential (primary) hypertension: Secondary | ICD-10-CM | POA: Diagnosis not present

## 2022-09-22 MED ORDER — TRULICITY 0.75 MG/0.5ML ~~LOC~~ SOAJ: 0.7500 mg | SUBCUTANEOUS | 1 refills | Status: DC

## 2022-09-22 MED ORDER — ATORVASTATIN CALCIUM 20 MG PO TABS: 20.0000 mg | ORAL_TABLET | Freq: Every day | ORAL | 3 refills | Status: DC

## 2022-09-22 NOTE — Progress Notes (Signed)
Subjective:    Patient ID: Taylor Grimes, female    DOB: 1961-07-19, 62 y.o.   MRN: [id number]  HPI Here for health maintenance exam and to review chronic medical problems    Wt Readings from Last 3 Encounters:  09/22/22 196 lb 6 oz (89.1 kg)  06/26/22 201 lb 9.6 oz (91.4 kg)  06/23/22 201 lb (91.2 kg)   36.80 kg/m  Losing weight  Walks 15 minutes per day  Wants to inc later   Able to do more since she lost wt   Immunization History  Administered Date(s) Administered   Influenza Split 06/23/2012   Influenza,inj,Quad PF,6+ Mos 05/09/2013, 06/30/2014, 06/29/2015, 05/13/2016, 05/05/2017, 08/17/2018, 06/01/2019, 06/21/2020, 05/28/2021   Janssen (J&J) SARS-COV-2 Vaccination 10/08/2019   Moderna SARS-COV2 Booster Vaccination 07/19/2020   Moderna Sars-Covid-2 Vaccination 07/19/2020   Pneumococcal Polysaccharide-23 11/01/2012, 08/23/2019   Td 04/21/2007, 08/23/2019   Zoster Recombinat (Shingrix) 12/10/2020, 04/15/2022   There are no preventive care reminders to display for this patient.  Mammogram 11/2021 Self breast exam: no lumps   Past h/o endomet cancer Had hysterectomy Plans to follow up with gyn    Eye exam 08/2022  Colonoscopy 11/2021   Sister = has ? Liver problem / pending diagnosis   Mood    09/22/2022    8:03 AM 12/24/2021   10:44 AM 10/04/2021   11:26 AM 09/12/2020   10:09 AM 08/23/2019   10:57 AM  Depression screen PHQ 2/9  Decreased Interest 0 0 0 0 2  Down, Depressed, Hopeless 0 0 0 0 0  PHQ - 2 Score 0 0 0 0 2  Altered sleeping 0   0 0  Tired, decreased energy 0   0   Change in appetite 0   0 2  Feeling bad or failure about yourself  0   0 0  Trouble concentrating 0   0 0  Moving slowly or fidgety/restless 0   0 0  Suicidal thoughts 0   0 0  PHQ-9 Score 0   0 4  Difficult doing work/chores Not difficult at all   Not difficult at all Not difficult at all     HTN bp is stable today  No cp or palpitations or headaches or edema  No side effects to  medicines  BP Readings from Last 3 Encounters:  09/22/22 132/70  06/26/22 122/70  04/15/22 124/76    Losartan 50 mg daily   DM2 Lab Results  Component Value Date   HGBA1C 8.5 (H) 09/15/2022   This is stable from dec Metformin 1000 mg bid  Amaryl 6 mg daily - we recommended inc to 6 mg daily /tolerates well  In the am- fasting 110 avg Then 130s-140 later in the day   Talked to the pharmacist   Avoids bread  Avoids fried foods Eating some oatmeal  Avoid sweets  Drinks mostly water   Supposed to meet with population specialist  Cataract surgery planned soon   Lab Results  Component Value Date   MICROALBUR 2.6 (H) 03/17/2022   Hyperlipidemia Lab Results  Component Value Date   CHOL 191 09/15/2022   CHOL 189 03/17/2022   CHOL 163 09/06/2021   Lab Results  Component Value Date   HDL 34.10 (L) 09/15/2022   HDL 33.50 (L) 03/17/2022   HDL 38.30 (L) 09/06/2021   Lab Results  Component Value Date   LDLCALC 118 (H) 09/15/2022   LDLCALC 118 (H) 03/17/2022   [place] 90 09/06/2021   Lab  Results  Component Value Date   TRIG 193.0 (H) 09/15/2022   TRIG 188.0 (H) 03/17/2022   TRIG 174.0 (H) 09/06/2021   Lab Results  Component Value Date   CHOLHDL 6 09/15/2022   CHOLHDL 6 03/17/2022   CHOLHDL 4 09/06/2021   Lab Results  Component Value Date   LDLDIRECT 111.0 08/19/2019   LDLDIRECT 113.0 06/17/2017   Atorvastatin 20 mg daily   Uses olive oil      Hypothyroidism  Pt has no clinical changes No change in energy level/ hair or skin/ edema and no tremor Lab Results  Component Value Date   TSH 0.43 09/15/2022    Levothy 112 mcg daily    Patient Active Problem List   Diagnosis Date Noted   Personal history of colonic polyps    Polyp of cecum    Yeast dermatitis 10/15/2021   Encounter for screening mammogram for breast cancer 10/04/2021   Hyperlipidemia associated with type 2 diabetes mellitus ([place]) 08/17/2018   Hypertrophic toenail 02/02/2018    Anxiety disorder 09/12/2015   Low HDL (under 40) 10/24/2014   Routine general medical examination at a health care facility 03/08/2014   History of endometrial cancer 12/15/2012   Colon cancer screening 11/01/2012   DM type 2 (diabetes mellitus, type 2) ([place]) 06/30/2012   Varicose veins of leg with swelling, left 11/18/2011   BMI 36.0-36.9,adult 11/12/2010   Essential hypertension 07/12/2009   Hypothyroidism (acquired) 05/16/2009   Past Medical History:  Diagnosis Date   Anxiety    Breast lesion    benign   Diabetes mellitus without complication (HCC)    Elevated transaminase level    ? fatty liver    History of endometrial cancer 12/15/2012   FIGO Grade 2 endometrioid adenocarcinoma with squamous differentiation involving the mucosa only.  From SGO Guidelines:  Recommended surveillance after treatment of endometrial cancer includes a follow-up visit every 3-6 months for 2 years, then every 6 months for 3 years, and annually thereafter. Each follow-up visit should include a thorough patient history; elicitation and investigation of any new symptoms associated with recurrence, such as vaginal bleeding, pelvic pain, weight loss, or lethargy; and a thorough speculum, pelvic, and rectovaginal examination   HTN (hypertension)    Hypothyroid    UTI (urinary tract infection)    Past Surgical History:  Procedure Laterality Date   ABDOMINAL HYSTERECTOMY     BREAST BIOPSY     benign cyst 2011   CATARACT EXTRACTION W/PHACO Right 01/07/2022   Procedure: CATARACT EXTRACTION PHACO AND INTRAOCULAR LENS PLACEMENT ([place]) RIGHT DIABETIC 8.04 01:01.7;  Surgeon: Birder Robson, MD;  Location: [facility];  Service: Ophthalmology;  Laterality: Right;  Diabetic   CESAREAN SECTION     COLONOSCOPY WITH PROPOFOL N/A 11/20/2021   Procedure: COLONOSCOPY WITH PROPOFOL;  Surgeon: Lin Landsman, MD;  Location: [facility] ENDOSCOPY;  Service: Gastroenterology;  Laterality: N/A;   INCISION AND DRAINAGE  ABSCESS Right 08/16/2021   Procedure: INCISION AND DRAINAGE ABDOMINAL WALL ABSCESS;  Surgeon: Ronny Bacon, MD;  Location: [facility] ORS;  Service: General;  Laterality: Right;   LAPAROSCOPIC TOTAL HYSTERECTOMY  11/2012   Social History   Tobacco Use   Smoking status: Former    Types: Cigarettes    Quit date: 07/22/1979    Years since quitting: 43.2    Passive exposure: Never   Smokeless tobacco: Never   Tobacco comments:    briefly smoked at 64.  (May occasionally have 1 cig)  Vaping Use   Vaping  Use: Never used  Substance Use Topics   Alcohol use: Yes    Comment: rarely   Drug use: No   Family History  Problem Relation Age of Onset   Hypothyroidism Mother    Alzheimer's disease Mother    Stroke Father    Deep vein thrombosis Sister    Colon cancer Neg Hx    Esophageal cancer Neg Hx    Stomach cancer Neg Hx    Rectal cancer Neg Hx    Breast cancer Neg Hx    No Known Allergies Current Outpatient Medications on File Prior to Visit  Medication Sig Dispense Refill   glimepiride (AMARYL) 4 MG tablet Take 1.5 tablets (6 mg total) by mouth daily before breakfast. 45 tablet 2   levothyroxine (SYNTHROID) 112 MCG tablet TAKE 1 TABLET BY MOUTH ONCE DAILY 30 tablet 4   losartan (COZAAR) 100 MG tablet TAKE 1/2 TABLET BY MOUTH DAILY 45 tablet 0   metFORMIN (GLUCOPHAGE) 1000 MG tablet Take 1 tablet (1,000 mg total) by mouth 2 (two) times daily with a meal. 180 tablet 3   No current facility-administered medications on file prior to visit.    Review of Systems  Constitutional:  Negative for activity change, appetite change, fatigue, fever and unexpected weight change.  HENT:  Negative for congestion, ear pain, rhinorrhea, sinus pressure and sore throat.   Eyes:  Negative for pain, redness and visual disturbance.       Cataract surgery planned soon   Respiratory:  Negative for cough, shortness of breath and wheezing.   Cardiovascular:  Negative for chest pain and palpitations.   Gastrointestinal:  Negative for abdominal pain, blood in stool, constipation and diarrhea.  Endocrine: Negative for polydipsia and polyuria.  Genitourinary:  Negative for dysuria, frequency and urgency.  Musculoskeletal:  Positive for arthralgias. Negative for back pain and myalgias.  Skin:  Negative for pallor and rash.  Allergic/Immunologic: Negative for environmental allergies.  Neurological:  Negative for dizziness, syncope and headaches.  Hematological:  Negative for adenopathy. Does not bruise/bleed easily.  Psychiatric/Behavioral:  Negative for decreased concentration and dysphoric mood. The patient is not nervous/anxious.        Objective:   Physical Exam Constitutional:      General: She is not in acute distress.    Appearance: Normal appearance. She is well-developed. She is obese. She is not ill-appearing or diaphoretic.  HENT:     Head: Normocephalic and atraumatic.     Right Ear: Tympanic membrane, ear canal and external ear normal.     Left Ear: Tympanic membrane, ear canal and external ear normal.     Nose: Nose normal. No congestion.     Mouth/Throat:     Mouth: Mucous membranes are moist.     Pharynx: Oropharynx is clear. No posterior oropharyngeal erythema.  Eyes:     General: No scleral icterus.    Extraocular Movements: Extraocular movements intact.     Conjunctiva/sclera: Conjunctivae normal.     Pupils: Pupils are equal, round, and reactive to light.  Neck:     Thyroid: No thyromegaly.     Vascular: No carotid bruit or JVD.  Cardiovascular:     Rate and Rhythm: Normal rate and regular rhythm.     Pulses: Normal pulses.     Heart sounds: Normal heart sounds.     No gallop.  Pulmonary:     Effort: Pulmonary effort is normal. No respiratory distress.     Breath sounds: Normal breath sounds. No wheezing.  Comments: Good air exch Chest:     Chest wall: No tenderness.  Abdominal:     General: Bowel sounds are normal. There is no distension or  abdominal bruit.     Palpations: Abdomen is soft. There is no mass.     Tenderness: There is no abdominal tenderness.     Hernia: No hernia is present.  Genitourinary:    Comments: Breast exam: No mass, nodules, thickening, tenderness, bulging, retraction, inflamation, nipple discharge or skin changes noted.  No axillary or clavicular LA.     Musculoskeletal:        General: No tenderness. Normal range of motion.     Cervical back: Normal range of motion and neck supple. No rigidity. No muscular tenderness.     Right lower leg: No edema.     Left lower leg: No edema.     Comments: No kyphosis   Lymphadenopathy:     Cervical: No cervical adenopathy.  Skin:    General: Skin is warm and dry.     Coloration: Skin is not pale.     Findings: No erythema or rash.     Comments: Solar lentigines diffusely Scattered sks  Neurological:     Mental Status: She is alert. Mental status is at baseline.     Cranial Nerves: No cranial nerve deficit.     Motor: No abnormal muscle tone.     Coordination: Coordination normal.     Gait: Gait normal.     Deep Tendon Reflexes: Reflexes are normal and symmetric. Reflexes normal.  Psychiatric:        Mood and Affect: Mood normal.        Cognition and Memory: Cognition and memory normal.           Assessment & Plan:   Problem List Items Addressed This Visit       Cardiovascular and Mediastinum   Essential hypertension    bp in fair control at this time  BP Readings from Last 1 Encounters:  09/22/22 132/70  No changes needed Most recent labs reviewed  Disc lifstyle change with low sodium diet and exercise  Plan to continue losartan 50 mg daily        Relevant Medications   atorvastatin (LIPITOR) 20 MG tablet     Endocrine   DM type 2 (diabetes mellitus, type 2) (HCC)    Some improvement with better diet  Lab Results  Component Value Date   HGBA1C 8.5 (H) 09/15/2022  This is stable but not imp  Taking metformin 1000 mg bid and  amaryl 6 mg daily  Home readings are not too back overall  Diet and exercise are both better  I think a GLP medicine could help DM and weight  Sent trulicity px to pharm/ will do prior auth and see if covered  If so will call to get training here re: how to use  Disc option of GLP medication including possible side effects like GI intolerance and risk of thyroid and endocrine cancer, pancreatitis and gallstones, kidney problems and diabetic retinopathy  Follow up planned 3 mo       Relevant Medications   atorvastatin (LIPITOR) 20 MG tablet   Dulaglutide (TRULICITY) [id number] 0000000 SOPN   Hyperlipidemia associated with type 2 diabetes mellitus ([place])    Disc goals for lipids and reasons to control them Rev last labs with pt Rev low sat fat diet in detail Last LDL was not at goal  On atorvastatin 20 mg  LDL of 118  Now diet is improved May need inc dose statin in future to get to goal once we get glucose better controlled      Relevant Medications   atorvastatin (LIPITOR) 20 MG tablet   Dulaglutide (TRULICITY) [id number] 0000000 SOPN   Hypothyroidism (acquired)    Under care of endocrinology Lab Results  Component Value Date   TSH 0.43 09/15/2022  Takes levothy 112 mcg        Other   BMI 36.0-36.9,adult    Discussed how this problem influences overall health and the risks it imposes  Reviewed plan for weight loss with lower calorie diet (via better food choices and also portion control or program like weight watchers) and exercise building up to or more than 30 minutes 5 days per week including some aerobic activity  Enc pt to keep up the good job with wt loss so far       Colon cancer screening    Colonoscopy utd 11/2021      Routine general medical examination at a health care facility - Primary    Reviewed health habits including diet and exercise and skin cancer prevention Reviewed appropriate screening tests for age  Also reviewed health mt list, fam hx and immunization  status , as well as social and family history   See HPI Labs reviewed Mammogram due in may  Eye exam utd Planning f/u with her gyn  Colonoscopy utd 11/2021 Enc ca and D intake for bone health  PHQ score of 0

## 2022-09-22 NOTE — Assessment & Plan Note (Signed)
Colonoscopy utd 11/2021

## 2022-09-22 NOTE — Assessment & Plan Note (Signed)
Disc goals for lipids and reasons to control them Rev last labs with pt Rev low sat fat diet in detail Last LDL was not at goal  On atorvastatin 20 mg  LDL of 118  Now diet is improved May need inc dose statin in future to get to goal once we get glucose better controlled

## 2022-09-22 NOTE — Assessment & Plan Note (Signed)
Discussed how this problem influences overall health and the risks it imposes  Reviewed plan for weight loss with lower calorie diet (via better food choices and also portion control or program like weight watchers) and exercise building up to or more than 30 minutes 5 days per week including some aerobic activity  Enc pt to keep up the good job with wt loss so far

## 2022-09-22 NOTE — Patient Instructions (Addendum)
Try to get 1200-1500 mg of calcium per day with at least 1000 iu of vitamin D - for bone health   Go up on atrovastatin to 20 mg daily  If you have any problems let us know   I will send px for the generic of trulicity and we will work on getting it covered  When you get your medicine let us know so I can have someone show you how to use it   Then follow up about a month after starting that medicine   Keep up the good work with diet and exercise and weight loss

## 2022-09-22 NOTE — Assessment & Plan Note (Signed)
Under care of endocrinology Lab Results  Component Value Date   TSH 0.43 09/15/2022   Takes levothy 112 mcg

## 2022-09-22 NOTE — Assessment & Plan Note (Signed)
bp in fair control at this time  BP Readings from Last 1 Encounters:  09/22/22 132/70   No changes needed Most recent labs reviewed  Disc lifstyle change with low sodium diet and exercise  Plan to continue losartan 50 mg daily

## 2022-09-22 NOTE — Patient Outreach (Signed)
Medicaid Managed Care   Nurse Care Manager Note  09/22/2022 Name:  Taylor Grimes MRN:  [id number] DOB:  1960-08-11  Taylor Grimes is an 62 y.o. year old female who is [facility] primary patient of Tower, Taylor Fanny, MD.  The Medicaid Managed Care Coordination team was consulted for assistance with:    DMII  [name] was given information about Medicaid Managed Care Coordination team services today. Taylor Grimes Patient agreed to services and verbal consent obtained.  Engaged with patient by telephone for follow up visit in response to provider referral for case management and/or care coordination services.   Assessments/Interventions:  Review of past medical history, allergies, medications, health status, including review of consultants reports, laboratory and other test data, was performed as part of comprehensive evaluation and provision of chronic care management services.  SDOH (Social Determinants of Health) assessments and interventions performed: SDOH Interventions    Flowsheet Row Patient Outreach Telephone from 09/22/2022 in [place] Office Visit from 02/15/2019 in [facility] [place] Interventions Intervention Not Indicated --  Housing Interventions Intervention Not Indicated --  Transportation Interventions Intervention Not Indicated --  Utilities Interventions Intervention Not Indicated --  Depression Interventions/Treatment  -- Medication       Care Plan  No Known Allergies  Medications Reviewed Today     Reviewed by Taylor Montane, RN (Registered Nurse) on 09/22/22 at 1342  Med List Status: <None>   Medication Order Taking? Sig Documenting Provider Last Dose Status Informant  atorvastatin (LIPITOR) 20 MG tablet [id number] Yes Take 1 tablet (20 mg total) by mouth daily. Tower, Taylor Fanny, MD Taking Active   Dulaglutide (TRULICITY) [id number] SOPN [id number] No Inject 0.75 mg into  the skin once [facility] week.  Patient not taking: Reported on 09/22/2022   Taylor Greenspan, MD Not Taking Active            Med Note Taylor Grimes, Taylor Grimes   Mon Sep 22, 2022  1:39 PM) Plans to pick up this week and start taking  glimepiride (AMARYL) 4 MG tablet [id number] Yes Take 1.5 tablets (6 mg total) by mouth daily before breakfast. Tower, Taylor Fanny, MD Taking Active            Med Note (Taylor Grimes   Mon Sep 22, 2022  1:40 PM) Will stop taking once taking trulicity  levothyroxine (SYNTHROID) 112 MCG tablet [id number] Yes TAKE 1 TABLET BY MOUTH ONCE DAILY Taylor Kingdom, MD Taking Active   losartan (COZAAR) 100 MG tablet [id number] Yes TAKE 1/2 TABLET BY MOUTH DAILY Tower, Taylor Fanny, MD Taking Active   metFORMIN (GLUCOPHAGE) 1000 MG tablet [id number] Yes Take 1 tablet (1,000 mg total) by mouth 2 (two) times daily with [facility] meal. Tower, Taylor Fanny, MD Taking Active             Patient Active Problem List   Diagnosis Date Noted   Personal history of colonic polyps    Polyp of cecum    Fecal occult blood test positive 10/23/2021   Yeast dermatitis 10/15/2021   Encounter for screening mammogram for breast cancer 10/04/2021   Hypergranulation 09/17/2021   Abscess of abdominal wall 08/16/2021   Hyperlipidemia associated with type 2 diabetes mellitus ([place]) 08/17/2018   Hypertrophic toenail 02/02/2018   Anxiety disorder 09/12/2015   Low HDL (under 40) 10/24/2014   Routine general medical examination at [facility] 03/08/2014  History of endometrial cancer 12/15/2012   Colon cancer screening 11/01/2012   DM type 2 (diabetes mellitus, type 2) ([place]) 06/30/2012   Varicose veins of leg with swelling, left 11/18/2011   BMI 36.0-36.9,adult 11/12/2010   Essential hypertension 07/12/2009   Hypothyroidism (acquired) 05/16/2009    Conditions to be addressed/monitored per PCP order:  DMII  Care Plan : [place] of Care  Updates made by Taylor Montane, RN since 09/22/2022 12:00 AM      Problem: Health Management needs related to DMII      Long-Range Goal: Development of Plan of Care to address Health Management needs related to DMII   Start Date: 09/22/2022  Expected End Date: 12/21/2022  Note:   Current Barriers:  Chronic Disease Management support and education needs related to DMII  RNCM Clinical Goal(s):  Patient will verbalize understanding of plan for management of DMII as evidenced by patient reports take all medications exactly as prescribed and will call provider for medication related questions as evidenced by patient reports and EMR documentation    attend all scheduled medical appointments: 10/07/22 for left Cataract Surgery, 10/09/22 with TFC and 10/22/22 with [facility] Pharmacist as evidenced by provider documentation        through collaboration with RN Care manager, provider, and care team.   Interventions: Inter-disciplinary care team collaboration (see longitudinal plan of care) Evaluation of current treatment plan related to  self management and patient's adherence to plan as established by provider   Diabetes:  (Status: New goal.) Long Term Goal   Lab Results  Component Value Date   HGBA1C 8.5 (H) 09/15/2022   @ Assessed patient's understanding of A1c goal: <7% Provided education to patient about basic DM disease process; Reviewed medications with patient and discussed importance of medication adherence;        Reviewed prescribed diet with patient limit carbohydrates, increase lean protein, vegetables and fruits; Counseled on importance of regular laboratory monitoring as prescribed;        Discussed plans with patient for ongoing care management follow up and provided patient with direct contact information for care management team;      Reviewed scheduled/upcoming provider appointments including: 10/07/22 for left Cataract Surgery, 10/09/22 with T[facility](or call to reschedule);         call provider for findings outside established parameters;       Review of  patient status, including review of consultants reports, relevant laboratory and other test results, and medications completed;       Assessed social determinant of health barriers;         Patient Goals/Self-Care Activities: Take medications as prescribed   Attend all scheduled provider appointments Call provider office for new concerns or questions  drink 6 to 8 glasses of water each day fill half of plate with vegetables manage portion size Increase exercise to 150 minutes/week       Follow Up:  Patient agrees to Care Plan and Follow-up.  Plan: The Managed Medicaid care management team will reach out to the patient again over the next 30 days.  Date/time of next scheduled RN care management/care coordination outreach:  11/06/22 @ 1:15pm  Lurena Joiner RN, BSN [place]  [employer] manager

## 2022-09-22 NOTE — Assessment & Plan Note (Signed)
Reviewed health habits including diet and exercise and skin cancer prevention Reviewed appropriate screening tests for age  Also reviewed health mt list, fam hx and immunization status , as well as social and family history   See HPI Labs reviewed Mammogram due in may  Eye exam utd Planning f/u with her gyn  Colonoscopy utd 11/2021 Enc ca and D intake for bone health  PHQ score of 0

## 2022-09-22 NOTE — Patient Instructions (Signed)
Visit Information  Taylor Grimes was given information about Medicaid Managed Care team care coordination services as a part of their [place] Medicaid benefit. Taylor Grimes verbally consented to engagement with the [facility] Managed Care team.   If you are experiencing a medical emergency, please call 911 or report to your local emergency department or urgent care.   If you have a non-emergency medical problem during routine business hours, please contact your provider's office and ask to speak with a nurse.   For questions related to your [facility], please call: [phone] or visit the homepage here: [email or web address]  If you would like to schedule transportation through your [facility], please call the following number at least 2 days in advance of your appointment: [phone]   Rides for urgent appointments can also be made after hours by calling Member Services.  Call the [place] at [phone], at any time, 24 hours a day, 7 days a week. If you are in danger or need immediate medical attention call 911.  If you would like help to quit smoking, call 1-800-QUIT-NOW (805)836-2235) OR Espaol: 1-855-Djelo-Ya HD:1601594) o para ms informacin haga clic aqu or Text READY to 200-400 to register via text  Taylor Grimes,   Please see education materials related to DM provided by MyChart link.  Patient verbalizes understanding of instructions and care plan provided today and agrees to view in [place]. Active MyChart status and patient understanding of how to access instructions and care plan via MyChart confirmed with patient.     Telephone follow up appointment with Managed Medicaid care management team member scheduled for:11/06/22 @ 1:15pm  Lurena Joiner RN, BSN [place] RN Care  Coordinator   Following is a copy of your plan of care:  Care Plan : RN Care Manager Plan of Care  Updates made by Melissa Montane, RN since 09/22/2022 12:00 AM     Problem: Health Management needs related to DMII      Long-Range Goal: Development of Plan of Care to address Health Management needs related to DMII   Start Date: 09/22/2022  Expected End Date: 12/21/2022  Note:   Current Barriers:  Chronic Disease Management support and education needs related to DMII  RNCM Clinical Goal(s):  Patient will verbalize understanding of plan for management of DMII as evidenced by patient reports take all medications exactly as prescribed and will call provider for medication related questions as evidenced by patient reports and EMR documentation    attend all scheduled medical appointments: 10/07/22 for left Cataract Surgery, 10/09/22 with TFC and 10/22/22 with [facility] Pharmacist as evidenced by provider documentation        through collaboration with RN Care manager, provider, and care team.   Interventions: Inter-disciplinary care team collaboration (see longitudinal plan of care) Evaluation of current treatment plan related to  self management and patient's adherence to plan as established by provider   Diabetes:  (Status: New goal.) Long Term Goal   Lab Results  Component Value Date   HGBA1C 8.5 (H) 09/15/2022   @ Assessed patient's understanding of A1c goal: <7% Provided education to patient about basic DM disease process; Reviewed medications with patient and discussed importance of medication adherence;        Reviewed prescribed diet with patient limit carbohydrates, increase lean protein, vegetables and fruits; Counseled on importance of regular laboratory monitoring as prescribed;  Discussed plans with patient for ongoing care management follow up and provided patient with direct contact information for care management team;      Reviewed scheduled/upcoming provider appointments  including: 10/07/22 for left Cataract Surgery, 10/09/22 with TFC(or call to reschedule);         call provider for findings outside established parameters;       Review of patient status, including review of consultants reports, relevant laboratory and other test results, and medications completed;       Assessed social determinant of health barriers;         Patient Goals/Self-Care Activities: Take medications as prescribed   Attend all scheduled provider appointments Call provider office for new concerns or questions  drink 6 to 8 glasses of water each day fill half of plate with vegetables manage portion size Increase exercise to 150 minutes/week

## 2022-09-22 NOTE — Assessment & Plan Note (Signed)
Some improvement with better diet  Lab Results  Component Value Date   HGBA1C 8.5 (H) 09/15/2022  This is stable but not imp  Taking metformin 1000 mg bid and amaryl 6 mg daily  Home readings are not too back overall  Diet and exercise are both better  I think a GLP medicine could help DM and weight  Sent trulicity px to pharm/ will do prior auth and see if covered  If so will call to get training here re: how to use  Disc option of GLP medication including possible side effects like GI intolerance and risk of thyroid and endocrine cancer, pancreatitis and gallstones, kidney problems and diabetic retinopathy  Follow up planned 3 mo

## 2022-09-29 ENCOUNTER — Encounter: Payer: Self-pay | Admitting: Ophthalmology

## 2022-10-01 ENCOUNTER — Ambulatory Visit (INDEPENDENT_AMBULATORY_CARE_PROVIDER_SITE_OTHER): Payer: 59

## 2022-10-01 DIAGNOSIS — E119 Type 2 diabetes mellitus without complications: Secondary | ICD-10-CM | POA: Diagnosis not present

## 2022-10-01 NOTE — Progress Notes (Signed)
Patient came in today for injection teaching on Trulicity. Went over all injection instructions on how to properly administer medication at home. Patient administered injection to left abdomen SQ in office after instruction. Patient verbalized understanding and showed competency.

## 2022-10-01 NOTE — Discharge Instructions (Signed)

## 2022-10-03 ENCOUNTER — Telehealth: Payer: Self-pay | Admitting: Family Medicine

## 2022-10-03 NOTE — Telephone Encounter (Signed)
[place] called in stated a fax was sent and will fax again orders for a BP monitor for pt

## 2022-10-03 NOTE — Telephone Encounter (Signed)
Form has been placed in [place] box for review and completion.

## 2022-10-07 ENCOUNTER — Encounter
Admission: RE | Disposition: A | Discharge status: Home / Self Care | Payer: Self-pay | Source: Home / Self Care | Attending: Ophthalmology

## 2022-10-07 ENCOUNTER — Ambulatory Visit
Admission: RE | Admit: 2022-10-07 | Discharge: 2022-10-07 | Disposition: A | Discharge status: Home / Self Care | Payer: 59 | Attending: Ophthalmology | Admitting: Ophthalmology

## 2022-10-07 ENCOUNTER — Encounter: Payer: Self-pay | Admitting: Ophthalmology

## 2022-10-07 ENCOUNTER — Ambulatory Visit: Payer: 59 | Admitting: Anesthesiology

## 2022-10-07 ENCOUNTER — Other Ambulatory Visit: Payer: Self-pay

## 2022-10-07 DIAGNOSIS — Z7985 Long-term (current) use of injectable non-insulin antidiabetic drugs: Secondary | ICD-10-CM | POA: Diagnosis not present

## 2022-10-07 DIAGNOSIS — Z7984 Long term (current) use of oral hypoglycemic drugs: Secondary | ICD-10-CM | POA: Diagnosis not present

## 2022-10-07 DIAGNOSIS — F419 Anxiety disorder, unspecified: Secondary | ICD-10-CM | POA: Diagnosis not present

## 2022-10-07 DIAGNOSIS — E1136 Type 2 diabetes mellitus with diabetic cataract: Secondary | ICD-10-CM | POA: Insufficient documentation

## 2022-10-07 DIAGNOSIS — Z87891 Personal history of nicotine dependence: Secondary | ICD-10-CM | POA: Insufficient documentation

## 2022-10-07 DIAGNOSIS — E039 Hypothyroidism, unspecified: Secondary | ICD-10-CM | POA: Diagnosis not present

## 2022-10-07 DIAGNOSIS — Z79899 Other long term (current) drug therapy: Secondary | ICD-10-CM | POA: Diagnosis not present

## 2022-10-07 DIAGNOSIS — I10 Essential (primary) hypertension: Secondary | ICD-10-CM | POA: Insufficient documentation

## 2022-10-07 DIAGNOSIS — H2512 Age-related nuclear cataract, left eye: Secondary | ICD-10-CM | POA: Insufficient documentation

## 2022-10-07 HISTORY — PX: CATARACT EXTRACTION W/PHACO: SHX586

## 2022-10-07 LAB — GLUCOSE, CAPILLARY: Glucose-Capillary: 175 mg/dL — ABNORMAL HIGH (ref 70–99)

## 2022-10-07 SURGERY — PHACOEMULSIFICATION, CATARACT, WITH IOL INSERTION
Anesthesia: Monitor Anesthesia Care | Site: Eye | Laterality: Left

## 2022-10-07 MED ORDER — SIGHTPATH DOSE#1 BSS IO SOLN
INTRAOCULAR | Status: DC | PRN
  Administered 2022-10-07: 15 mL via INTRAOCULAR

## 2022-10-07 MED ORDER — ARMC OPHTHALMIC DILATING DROPS
1.0000 | OPHTHALMIC | Status: DC | PRN
  Administered 2022-10-07 (×3): 1 via OPHTHALMIC

## 2022-10-07 MED ORDER — SIGHTPATH DOSE#1 BSS IO SOLN
INTRAOCULAR | Status: DC | PRN
  Administered 2022-10-07: 2 mL

## 2022-10-07 MED ORDER — SIGHTPATH DOSE#1 NA CHONDROIT SULF-NA HYALURON 40-17 MG/ML IO SOLN
INTRAOCULAR | Status: DC | PRN
  Administered 2022-10-07: 1 mL via INTRAOCULAR

## 2022-10-07 MED ORDER — BRIMONIDINE TARTRATE-TIMOLOL 0.2-0.5 % OP SOLN
OPHTHALMIC | Status: DC | PRN
  Administered 2022-10-07: 1 [drp] via OPHTHALMIC

## 2022-10-07 MED ORDER — SIGHTPATH DOSE#1 BSS IO SOLN
INTRAOCULAR | Status: DC | PRN
  Administered 2022-10-07: 53 mL via OPHTHALMIC

## 2022-10-07 MED ORDER — TETRACAINE HCL 0.5 % OP SOLN
1.0000 [drp] | OPHTHALMIC | Status: DC | PRN
  Administered 2022-10-07 (×3): 1 [drp] via OPHTHALMIC

## 2022-10-07 MED ORDER — MIDAZOLAM HCL 2 MG/2ML IJ SOLN
INTRAMUSCULAR | Status: DC | PRN
  Administered 2022-10-07 (×2): 1 mg via INTRAVENOUS

## 2022-10-07 MED ORDER — MOXIFLOXACIN HCL 0.5 % OP SOLN
OPHTHALMIC | Status: DC | PRN
  Administered 2022-10-07: .2 mL via OPHTHALMIC

## 2022-10-07 MED ORDER — FENTANYL CITRATE (PF) 100 MCG/2ML IJ SOLN
INTRAMUSCULAR | Status: DC | PRN
  Administered 2022-10-07: 50 ug via INTRAVENOUS

## 2022-10-07 SURGICAL SUPPLY — 15 items
CANNULA ANT/CHMB 27G (MISCELLANEOUS) IMPLANT
CANNULA ANT/CHMB 27GA (MISCELLANEOUS) IMPLANT
CATARACT SUITE SIGHTPATH (MISCELLANEOUS) ×1 IMPLANT
FEE CATARACT SUITE SIGHTPATH (MISCELLANEOUS) ×1 IMPLANT
GLOVE BIOGEL PI IND STRL 8 (GLOVE) ×1 IMPLANT
GLOVE SURG ENC TEXT LTX SZ8 (GLOVE) ×1 IMPLANT
LENS IOL TECNIS EYHANCE 22.5 (Intraocular Lens) IMPLANT
NDL FILTER BLUNT 18X1 1/2 (NEEDLE) ×1 IMPLANT
NEEDLE FILTER BLUNT 18X1 1/2 (NEEDLE) ×1 IMPLANT
PACK VIT ANT 23G (MISCELLANEOUS) IMPLANT
RING MALYGIN (MISCELLANEOUS) IMPLANT
SUT ETHILON 10-0 CS-B-6CS-B-6 (SUTURE)
SUTURE EHLN 10-0 CS-B-6CS-B-6 (SUTURE) IMPLANT
SYR 3ML LL SCALE MARK (SYRINGE) ×1 IMPLANT
WATER STERILE IRR 250ML POUR (IV SOLUTION) ×1 IMPLANT

## 2022-10-07 NOTE — Op Note (Signed)
PREOPERATIVE DIAGNOSIS:  Nuclear sclerotic cataract of the left eye.   POSTOPERATIVE DIAGNOSIS:  Nuclear sclerotic cataract of the left eye.   OPERATIVE PROCEDURE:ORPROCALL@   SURGEON:  Birder Robson, MD.   ANESTHESIA:  Anesthesiologist: Ilene Qua, MD CRNA: Moises Blood, CRNA  1.      Managed anesthesia care. 2.     0.25ml of Shugarcaine was instilled following the paracentesis   COMPLICATIONS:  None.   TECHNIQUE:   Stop and chop   DESCRIPTION OF PROCEDURE:  The patient was examined and consented in the preoperative holding area where the aforementioned topical anesthesia was applied to the left eye and then brought back to the Operating Room where the left eye was prepped and draped in the usual sterile ophthalmic fashion and a lid speculum was placed. A paracentesis was created with the side port blade and the anterior chamber was filled with viscoelastic. A near clear corneal incision was performed with the steel keratome. A continuous curvilinear capsulorrhexis was performed with a cystotome followed by the capsulorrhexis forceps. Hydrodissection and hydrodelineation were carried out with BSS on a blunt cannula. The lens was removed in a stop and chop  technique and the remaining cortical material was removed with the irrigation-aspiration handpiece. The capsular bag was inflated with viscoelastic and the Technis [id number] lens was placed in the capsular bag without complication. The remaining viscoelastic was removed from the eye with the irrigation-aspiration handpiece. The wounds were hydrated. The anterior chamber was flushed with BSS and the eye was inflated to physiologic pressure. 0.75ml Vigamox was placed in the anterior chamber. The wounds were found to be water tight. The eye was dressed with Combigan. The patient was given protective glasses to wear throughout the day and a shield with which to sleep tonight. The patient was also given drops with which to begin a drop regimen  today and will follow-up with me in one day. Implant Name Type Inv. Item Serial No. Manufacturer Lot No. LRB No. Used Action  LENS IOL TECNIS EYHANCE 22.5 - [id number] Intraocular Lens LENS IOL TECNIS EYHANCE 22.5 [id number] [employer]  Left 1 Implanted    Procedure(s) with comments: CATARACT EXTRACTION PHACO AND INTRAOCULAR LENS PLACEMENT (IOC) LEFT DIABETIC 4.93 00:40.7 (Left) - Diabetic  Electronically signed: Birder Robson 10/07/2022 9:20 AM

## 2022-10-07 NOTE — Anesthesia Postprocedure Evaluation (Signed)
Anesthesia Post Note  Patient: Taylor Grimes  Procedure(s) Performed: CATARACT EXTRACTION PHACO AND INTRAOCULAR LENS PLACEMENT (IOC) LEFT DIABETIC 4.93 00:40.7 (Left: Eye)  Patient location during evaluation: PACU Anesthesia Type: MAC Level of consciousness: awake and alert Pain management: pain level controlled Vital Signs Assessment: post-procedure vital signs reviewed and stable Respiratory status: spontaneous breathing, nonlabored ventilation, respiratory function stable and patient connected to nasal cannula oxygen Cardiovascular status: stable and blood pressure returned to baseline Postop Assessment: no apparent nausea or vomiting Anesthetic complications: no   No notable events documented.   Last Vitals:  Vitals:   10/07/22 0920 10/07/22 0925  BP: 115/75 103/77  Pulse: 76 78  Resp: 18 16  Temp: (!) 36.1 C (!) 36.4 C  SpO2: 98% 96%    Last Pain:  Vitals:   10/07/22 0925  TempSrc:   PainSc: 0-No pain                 Ilene Qua

## 2022-10-07 NOTE — Transfer of Care (Signed)
Immediate Anesthesia Transfer of Care Note  Patient: Taylor Grimes  Procedure(s) Performed: CATARACT EXTRACTION PHACO AND INTRAOCULAR LENS PLACEMENT (IOC) LEFT DIABETIC 4.93 00:40.7 (Left: Eye)  Patient Location: PACU  Anesthesia Type: MAC  Level of Consciousness: awake, alert  and patient cooperative  Airway and Oxygen Therapy: Patient Spontanous Breathing and Patient connected to supplemental oxygen  Post-op Assessment: Post-op Vital signs reviewed, Patient's Cardiovascular Status Stable, Respiratory Function Stable, Patent Airway and No signs of Nausea or vomiting  Post-op Vital Signs: Reviewed and stable  Complications: No notable events documented.

## 2022-10-07 NOTE — Anesthesia Preprocedure Evaluation (Signed)
Anesthesia Evaluation  Patient identified by MRN, date of birth, ID band Patient awake    Reviewed: Allergy & Precautions, NPO status , Patient's Chart, lab work & pertinent test results  History of Anesthesia Complications Negative for: history of anesthetic complications  Airway Mallampati: III  TM Distance: >3 FB Neck ROM: full    Dental no notable dental hx.    Pulmonary neg pulmonary ROS, former smoker   Pulmonary exam normal        Cardiovascular hypertension, On Medications negative cardio ROS Normal cardiovascular exam     Neuro/Psych  PSYCHIATRIC DISORDERS Anxiety     negative neurological ROS     GI/Hepatic negative GI ROS, Neg liver ROS,,,  Endo/Other  diabetes, Type 2, Oral Hypoglycemic AgentsHypothyroidism    Renal/GU      Musculoskeletal   Abdominal   Peds  Hematology negative hematology ROS (+)   Anesthesia Other Findings Past Medical History: No date: Anxiety No date: Breast lesion     Comment:  benign No date: Diabetes mellitus without complication (HCC) No date: Elevated transaminase level     Comment:  ? 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  From [facility]               Guidelines:  Recommended surveillance after treatment of               endometrial cancer includes a follow-up visit every 3-6               months for 2 years, then every 6 months for 3 years, and               annually thereafter. Each follow-up visit should include               a thorough patient history; elicitation and investigation              of any new symptoms associated with recurrence, such as               vaginal bleeding, pelvic pain, weight loss, or lethargy;               and a thorough speculum, pelvic, and rectovaginal               examination No date: HTN  (hypertension) No date: Hypothyroid No date: UTI (urinary tract infection)  Past Surgical History: No date: ABDOMINAL HYSTERECTOMY No date: BREAST BIOPSY     Comment:  benign cyst 2011 01/07/2022: CATARACT EXTRACTION W/PHACO; Right     Comment:  Procedure: CATARACT EXTRACTION PHACO AND INTRAOCULAR               LENS PLACEMENT ([place]) RIGHT DIABETIC 8.04 01:01.7;                Surgeon: Birder Robson, MD;  Location: [place];  Service: Ophthalmology;  Laterality: Right;                Diabetic No date: CESAREAN SECTION 11/20/2021: COLONOSCOPY WITH PROPOFOL; N/A     Comment:  Procedure: COLONOSCOPY WITH PROPOFOL;  Surgeon: Lin Landsman, MD;  Location:  [facility];  Service:               Gastroenterology;  Laterality: N/A; 08/16/2021: INCISION AND DRAINAGE ABSCESS; Right     Comment:  Procedure: INCISION AND DRAINAGE ABDOMINAL WALL ABSCESS;              Surgeon: Ronny Bacon, MD;  Location: [facility] ORS;                Service: General;  Laterality: Right; 11/2012: LAPAROSCOPIC TOTAL HYSTERECTOMY  BMI    Body Mass Index: 36.73 kg/m      Reproductive/Obstetrics negative OB ROS                             Anesthesia Physical Anesthesia Plan  ASA: 2  Anesthesia Plan: MAC   Post-op Pain Management:    Induction: Intravenous  PONV Risk Score and Plan: 2 and Ondansetron  Airway Management Planned: Natural Airway and Nasal Cannula  Additional Equipment:   Intra-op Plan:   Post-operative Plan:   Informed Consent: I have reviewed the patients History and Physical, chart, labs and discussed the procedure including the risks, benefits and alternatives for the proposed anesthesia with the patient or authorized representative who has indicated his/her understanding and acceptance.     Dental Advisory Given  Plan Discussed with: Anesthesiologist, CRNA and Surgeon  Anesthesia Plan Comments: (Patient  consented for risks of anesthesia including but not limited to:  - adverse reactions to medications - damage to eyes, teeth, lips or other oral mucosa - nerve damage due to positioning  - sore throat or hoarseness - Damage to heart, brain, nerves, lungs, other parts of body or loss of life  Patient voiced understanding.)        Anesthesia Quick Evaluation

## 2022-10-07 NOTE — H&P (Signed)
[facility]   Primary Care Physician:  Tower, Wynelle Fanny, MD Ophthalmologist: Dr. Hortense Ramal  Pre-Procedure History & Physical: HPI:  Taylor Grimes is a 62 y.o. female here for cataract surgery.   Past Medical History:  Diagnosis Date   Anxiety    Breast lesion    benign   Diabetes mellitus without complication (HCC)    Elevated transaminase level    ? fatty liver    History of endometrial cancer 12/15/2012   FIGO Grade 2 endometrioid adenocarcinoma with squamous differentiation involving the mucosa only.  From SGO Guidelines:  Recommended surveillance after treatment of endometrial cancer includes a follow-up visit every 3-6 months for 2 years, then every 6 months for 3 years, and annually thereafter. Each follow-up visit should include a thorough patient history; elicitation and investigation of any new symptoms associated with recurrence, such as vaginal bleeding, pelvic pain, weight loss, or lethargy; and a thorough speculum, pelvic, and rectovaginal examination   HTN (hypertension)    Hypothyroid    UTI (urinary tract infection)     Past Surgical History:  Procedure Laterality Date   ABDOMINAL HYSTERECTOMY     BREAST BIOPSY     benign cyst 2011   CATARACT EXTRACTION W/PHACO Right 01/07/2022   Procedure: CATARACT EXTRACTION PHACO AND INTRAOCULAR LENS PLACEMENT ([place]) RIGHT DIABETIC 8.04 01:01.7;  Surgeon: Birder Robson, MD;  Location: [facility];  Service: Ophthalmology;  Laterality: Right;  Diabetic   CESAREAN SECTION     COLONOSCOPY WITH PROPOFOL N/A 11/20/2021   Procedure: COLONOSCOPY WITH PROPOFOL;  Surgeon: Lin Landsman, MD;  Location: [facility];  Service: Gastroenterology;  Laterality: N/A;   INCISION AND DRAINAGE ABSCESS Right 08/16/2021   Procedure: INCISION AND DRAINAGE ABDOMINAL WALL ABSCESS;  Surgeon: Ronny Bacon, MD;  Location: [facility];  Service: General;  Laterality: Right;   LAPAROSCOPIC TOTAL HYSTERECTOMY  11/2012    Prior to  Admission medications   Medication Sig Start Date End Date Taking? Authorizing Provider  atorvastatin (LIPITOR) 20 MG tablet Take 1 tablet (20 mg total) by mouth daily. 09/22/22  Yes Tower, Wynelle Fanny, MD  Dulaglutide (TRULICITY) A999333 0000000 SOPN Inject 0.75 mg into the skin once a week. 09/22/22  Yes Tower, Wynelle Fanny, MD  levothyroxine (SYNTHROID) 112 MCG tablet TAKE 1 TABLET BY MOUTH ONCE DAILY 07/22/22  Yes Philemon Kingdom, MD  losartan (COZAAR) 100 MG tablet TAKE 1/2 TABLET BY MOUTH DAILY 08/19/22  Yes Tower, Wynelle Fanny, MD  metFORMIN (GLUCOPHAGE) 1000 MG tablet Take 1 tablet (1,000 mg total) by mouth 2 (two) times daily with a meal. 10/04/21  Yes Tower, Wynelle Fanny, MD  glimepiride (AMARYL) 4 MG tablet Take 1.5 tablets (6 mg total) by mouth daily before breakfast. 07/24/22 10/22/22  Tower, Wynelle Fanny, MD    Allergies as of 09/09/2022   (No Known Allergies)    Family History  Problem Relation Age of Onset   Hypothyroidism Mother    Alzheimer's disease Mother    Stroke Father    Deep vein thrombosis Sister    Colon cancer Neg Hx    Esophageal cancer Neg Hx    Stomach cancer Neg Hx    Rectal cancer Neg Hx    Breast cancer Neg Hx     Social History   Socioeconomic History   Marital status: Legally Separated    Spouse name: Not on file   Number of children: 2   Years of education: Not on file   Highest education level: Not on  file  Occupational History   Occupation: shift Programme researcher, broadcasting/film/video: [employer]  Tobacco Use   Smoking status: Former    Types: Cigarettes    Quit date: 07/22/1979    Years since quitting: 43.2    Passive exposure: Never   Smokeless tobacco: Never   Tobacco comments:    briefly smoked at 54.  (May occasionally have 1 cig)  Vaping Use   Vaping Use: Never used  Substance and Sexual Activity   Alcohol use: Yes    Comment: rarely   Drug use: No   Sexual activity: Yes    Birth control/protection: Post-menopausal  Other Topics Concern   Not on file  Social  History Narrative   Not on file   Social Determinants of Health   Financial Resource Strain: Not on file  Food Insecurity: No Food Insecurity (09/22/2022)   Hunger Vital Sign    Worried About Running Out of Food in the Last Year: Never true    Ran Out of Food in the Last Year: Never true  Transportation Needs: No Transportation Needs (09/22/2022)   PRAPARE - Hydrologist (Medical): No    Lack of Transportation (Non-Medical): No  Physical Activity: Not on file  Stress: Not on file  Social Connections: Not on file  Intimate Partner Violence: Not on file    Review of Systems: See HPI, otherwise negative ROS  Physical Exam: BP 136/79   Temp 97.6 F (36.4 C) (Tympanic)   Resp 15   Ht 5' 1.26" (1.556 m)   Wt 88.9 kg   LMP 07/21/2008   SpO2 96%   BMI 36.72 kg/m  General:   Alert, cooperative in NAD Head:  Normocephalic and atraumatic. Respiratory:  Normal work of breathing. Cardiovascular:  RRR  Impression/Plan: Taylor Grimes is here for cataract surgery.  Risks, benefits, limitations, and alternatives regarding cataract surgery have been reviewed with the patient.  Questions have been answered.  All parties agreeable.   Birder Robson, MD  10/07/2022, 8:57 AM

## 2022-10-08 ENCOUNTER — Encounter: Payer: Self-pay | Admitting: Ophthalmology

## 2022-10-09 ENCOUNTER — Ambulatory Visit: Payer: 59 | Admitting: Podiatry

## 2022-10-16 NOTE — Telephone Encounter (Signed)
Have you seen this ?

## 2022-10-16 NOTE — Telephone Encounter (Signed)
Home care deliveries called in to follow up on this form completion.

## 2022-10-21 ENCOUNTER — Other Ambulatory Visit: Payer: Self-pay | Admitting: Family Medicine

## 2022-10-22 ENCOUNTER — Other Ambulatory Visit: Payer: 59 | Admitting: Pharmacist

## 2022-10-22 MED ORDER — TRULICITY 1.5 MG/0.5ML ~~LOC~~ SOAJ: 1.5000 mg | SUBCUTANEOUS | 2 refills | Status: DC

## 2022-10-22 NOTE — Progress Notes (Signed)
10/22/2022 Name: ORLY PEZZINO MRN: [id number] DOB: 08-17-1960  Chief Complaint  Patient presents with   Medication Management   Diabetes   Hypertension   Hyperlipidemia    Taylor Grimes is a 62 y.o. year old female who presented for a telephone visit.   They were referred to the pharmacist by a quality report for assistance in managing diabetes.   Patient is participating in a Managed Medicaid Plan:  Yes  Subjective:  Care Team: Primary Care Provider: Abner Greenspan, MD ; Next Scheduled Visit: 11/14/22  Medication Access/Adherence  Current Pharmacy:  [facility] [phone] [address], [place] [address] Phone: [phone] Fax: [place], [place] [address] [address] Phone: [phone] Fax: [phone]  [id number] [place], [place] [address] [place] [address] Phone: [phone] Fax: [phone]   Patient reports affordability concerns with their medications: No  Patient reports access/transportation concerns to their pharmacy: No  Patient reports adherence concerns with their medications:  No     Diabetes:  Current medications: metformin 1000 mg twice daily, Trulicity [id number] mg weekly ; she self-discontinued glimepiride   Reports she is tolerating Trulicity well; some stomach upset the day after she took for the first two weeks, but this has improved  Current glucose readings: fastings 200s   Current meal patterns:  - Confirms she is decreasing portions sizes; worries that she cannot eat anything with sugar in it. Focusing on lean proteins, fruits, vegetables; increasing water intake  Hypertension:  Current medications: losartan 50 mg daily  Patient does not have a validated, automated, upper arm home BP cuff; has not received the order from [facility] yet.  Hyperlipidemia/ASCVD Risk Reduction  Current  lipid lowering medications: atorvastatin 20 mg daily   Objective:  Lab Results  Component Value Date   HGBA1C 8.5 (H) 09/15/2022    Lab Results  Component Value Date   CREATININE 0.81 09/15/2022   BUN 13 09/15/2022   NA 142 09/15/2022   K 4.2 09/15/2022   CL 104 09/15/2022   CO2 28 09/15/2022    Lab Results  Component Value Date   CHOL 191 09/15/2022   HDL 34.10 (L) 09/15/2022   LDLCALC 118 (H) 09/15/2022   LDLDIRECT 111.0 08/19/2019   TRIG 193.0 (H) 09/15/2022   CHOLHDL 6 09/15/2022    Medications Reviewed Today     Reviewed by Osker Mason, RPH-CPP (Pharmacist) on 10/22/22 at 1039  Med List Status: <None>   Medication Order Taking? Sig Documenting Provider Last Dose Status Informant  atorvastatin (LIPITOR) 20 MG tablet [id number] Yes Take 1 tablet (20 mg total) by mouth daily. Tower, Wynelle Fanny, MD Taking Active   Dulaglutide (TRULICITY) A999333 0000000 SOPN [id number] Yes Inject 0.75 mg into the skin once a week. Tower, Wynelle Fanny, MD Taking Active            Med Note Jodi Mourning, Pastor Sgro T   Wed Oct 22, 2022 10:30 AM)    glimepiride (AMARYL) 4 MG tablet [id number] No TAKE 1.5 TABLETS (6 MG TOTAL) BY MOUTH DAILY BEFORE BREAKFAST.  Patient not taking: Reported on 10/22/2022   Abner Greenspan, MD Not Taking Active   levothyroxine (SYNTHROID) 112 MCG tablet [id number] Yes TAKE 1 TABLET BY MOUTH ONCE DAILY Philemon Kingdom, MD Taking Active   losartan (COZAAR) 100 MG tablet [id number]  Yes TAKE 1/2 TABLET BY MOUTH DAILY Tower, Wynelle Fanny, MD Taking Active   metFORMIN (GLUCOPHAGE) 1000 MG tablet [id number] Yes TAKE 1 TABLET (1,000 MG TOTAL) BY MOUTH TWICE A DAY WITH FOOD Tower, Wynelle Fanny, MD Taking Active               Assessment/Plan:   Diabetes: - Currently uncontrolled - Reviewed long term cardiovascular and renal outcomes of uncontrolled blood sugar - Reviewed goal A1c, goal fasting, and goal 2 hour post prandial glucose - Reviewed dietary modifications including: focus on  lean proteins, vegetables, whole grains;  - Recommend to increase Trulicity to 1.5 mg weekly. Discussed with PCP, she is in agreement. Order placed, pharmacy notified.  - Recommend to check glucose twice daily, fasting and 2 hour post prandial  Hypertension: - Currently controlled - Will follow up on order for home monitor - Recommend to continue current regimen at this time  Hyperlipidemia/ASCVD Risk Reduction: - Currently uncontrolled.  - Recommend to increase statin dose if next LDL is not at goal  Follow Up Plan: phone visit in ~ 6 weeks  Catie Hedwig Morton, PharmD, [place], [facility] [phone]

## 2022-10-22 NOTE — Patient Instructions (Signed)
Marne,   It was great talking to you today!  Continue metformin 1000 mg twice daily. Increase Trulicity to the 1.5 mg strength.  Check your blood sugars twice daily:  1) Fasting, first thing in the morning before breakfast and  2) 2 hours after your largest meal.   For a goal A1c of less than 7%, goal fasting readings are less than 130 and goal 2 hour after meal readings are less than 180.    Take care!  Catie Hedwig Morton, PharmD, [place], [facility] [phone]

## 2022-10-22 NOTE — Telephone Encounter (Signed)
We don't have fax, there is no contact # listed in this message, if company calls back please have them resend it or get a call back # for me to talk to them.

## 2022-10-23 NOTE — Telephone Encounter (Signed)
New form received and placed in PCP's inbox

## 2022-10-23 NOTE — Telephone Encounter (Signed)
Done and in IN box 

## 2022-10-24 NOTE — Telephone Encounter (Signed)
Form faxed

## 2022-10-29 DIAGNOSIS — I10 Essential (primary) hypertension: Secondary | ICD-10-CM | POA: Diagnosis not present

## 2022-11-04 ENCOUNTER — Ambulatory Visit (INDEPENDENT_AMBULATORY_CARE_PROVIDER_SITE_OTHER): Payer: 59 | Admitting: Family Medicine

## 2022-11-04 ENCOUNTER — Encounter: Payer: Self-pay | Admitting: Family Medicine

## 2022-11-04 VITALS — BP 120/85 | HR 74 | Temp 97.6°F | Ht 61.25 in | Wt 190.0 lb

## 2022-11-04 DIAGNOSIS — E1169 Type 2 diabetes mellitus with other specified complication: Secondary | ICD-10-CM | POA: Diagnosis not present

## 2022-11-04 DIAGNOSIS — E785 Hyperlipidemia, unspecified: Secondary | ICD-10-CM | POA: Diagnosis not present

## 2022-11-04 DIAGNOSIS — E119 Type 2 diabetes mellitus without complications: Secondary | ICD-10-CM

## 2022-11-04 DIAGNOSIS — I10 Essential (primary) hypertension: Secondary | ICD-10-CM | POA: Diagnosis not present

## 2022-11-04 DIAGNOSIS — Z6835 Body mass index (BMI) 35.0-35.9, adult: Secondary | ICD-10-CM

## 2022-11-04 MED ORDER — GLIMEPIRIDE 4 MG PO TABS: 4.0000 mg | ORAL_TABLET | Freq: Every day | ORAL | 3 refills | Status: DC

## 2022-11-04 MED ORDER — TRULICITY 0.75 MG/0.5ML ~~LOC~~ SOAJ: 0.7500 mg | SUBCUTANEOUS | 1 refills | Status: DC

## 2022-11-04 NOTE — Assessment & Plan Note (Signed)
Disc goals for lipids and reasons to control them Rev last labs with pt Rev low sat fat diet in detail Atorvastatin 20  May have to inc next time to get LDLcloser to goal

## 2022-11-04 NOTE — Assessment & Plan Note (Signed)
Unable to tolerate higher dose of trulicity so will back down   Discussed how this problem influences overall health and the risks it imposes  Reviewed plan for weight loss with lower calorie diet (via better food choices and also portion control or program like weight watchers) and exercise building up to or more than 30 minutes 5 days per week including some aerobic activity   Once feeling better should get back to walking Was also ref to DM teaching to learn about food choices / needs more protein

## 2022-11-04 NOTE — Patient Instructions (Addendum)
Continue metformin   Add back glimiperide at 4 mg daily (instead of 6)  Drop back to trulicity 0.75 mg weekly - if not tolerated let me know   Take care of yourself   If you get low blood sugars (under 70) please let us know     I want you to start some diabetic teaching  I put the referral in  Please let us know if you don't hear in 1-2 weeks   Try to get most of your carbohydrates from produce (with the exception of white potatoes)   Follow up in early June

## 2022-11-04 NOTE — Assessment & Plan Note (Signed)
bp in fair control at this time  BP Readings from Last 1 Encounters:  11/04/22 120/85   No changes needed Most recent labs reviewed  Disc lifstyle change with low sodium diet and exercise  Plan to continue losartan 50 mg daily

## 2022-11-04 NOTE — Assessment & Plan Note (Signed)
Lab Results  Component Value Date   HGBA1C 8.5 (H) 09/15/2022   Tolerated trulicity at 0.75 mg weekly but not 1.5 (GI intol) Will drop back to 0.75 Continue metformin 1000 mg bid  Add back glimiperide at lower dose- 4 mg   (since her glucose readings are still in 200s even with Trulicity and less food )  Plan to f/u with pharmacist F/u here early June for A1c and visit   Disc low glycemic diet today  ? If understands protein/ good choices  Referral done to DM education class at [facility] -pt is motivated  Will be able to get back to exercise/ walking when GI side eff stop  Inst pt to call if she does not tolerate 0.75 mg dose of trulicity - but expect that this will not be a problem since she tolerated it before  Continue ace and statin  Eye exam is utd

## 2022-11-04 NOTE — Progress Notes (Signed)
Subjective:    Patient ID: Taylor Grimes, female    DOB: 23-Jun-1961, 62 y.o.   MRN: [id number]  HPI Pt presents for DM2 follow up   Wt Readings from Last 3 Encounters:  11/04/22 190 lb (86.2 kg)  10/07/22 196 lb (88.9 kg)  09/22/22 196 lb 6 oz (89.1 kg)   35.61 kg/m  Vitals:   11/04/22 0912 11/04/22 0943  BP: (!) 142/80 120/85  Pulse: 74   Temp: 97.6 F (36.4 C)   SpO2: 100%      DM Lab Results  Component Value Date   HGBA1C 8.5 (H) 09/15/2022    Metformin 1000 mg bid  Amaryl 6 mg daily   Las time added trulicity  Saw pharmacist in early April and trulicity was inc to 1.5 mg weekly   When she took the 0.75 mg dose-she was sick for the first 2 doses then tolerated it  Was eating less and weight was coming down  Not tolerating 1.5 mg dose Vomiting   She took a medicine for acid in stomach  Generic of pepcid   Blood sugars are not coming down  Eating less   Sticking to a low glycemic diet     Lab Results  Component Value Date   MICROALBUR 2.6 (H) 03/17/2022      HTN bp is stable today  No cp or palpitations or headaches or edema  No side effects to medicines  BP Readings from Last 3 Encounters:  11/04/22 120/85  10/07/22 103/77  09/22/22 132/70     Losartan 50 mg daily    Patient Active Problem List   Diagnosis Date Noted   Personal history of colonic polyps    Polyp of cecum    Yeast dermatitis 10/15/2021   Encounter for screening mammogram for breast cancer 10/04/2021   Hyperlipidemia associated with type 2 diabetes mellitus 08/17/2018   Hypertrophic toenail 02/02/2018   Anxiety disorder 09/12/2015   Low HDL (under 40) 10/24/2014   Routine general medical examination at a health care facility 03/08/2014   History of endometrial cancer 12/15/2012   Colon cancer screening 11/01/2012   DM type 2 (diabetes mellitus, type 2) 06/30/2012   Varicose veins of leg with swelling, left 11/18/2011   BMI 36.0-36.9,adult 11/12/2010   Essential  hypertension 07/12/2009   Hypothyroidism (acquired) 05/16/2009   Past Medical History:  Diagnosis Date   Anxiety    Breast lesion    benign   Diabetes mellitus without complication    Elevated transaminase level    ? fatty liver    History of endometrial cancer 12/15/2012   FIGO Grade 2 endometrioid adenocarcinoma with squamous differentiation involving the mucosa only.  From SGO Guidelines:  Recommended surveillance after treatment of endometrial cancer includes a follow-up visit every 3-6 months for 2 years, then every 6 months for 3 years, and annually thereafter. Each follow-up visit should include a thorough patient history; elicitation and investigation of any new symptoms associated with recurrence, such as vaginal bleeding, pelvic pain, weight loss, or lethargy; and a thorough speculum, pelvic, and rectovaginal examination   HTN (hypertension)    Hypothyroid    UTI (urinary tract infection)    Past Surgical History:  Procedure Laterality Date   ABDOMINAL HYSTERECTOMY     BREAST BIOPSY     benign cyst 2011   CATARACT EXTRACTION W/PHACO Right 01/07/2022   Procedure: CATARACT EXTRACTION PHACO AND INTRAOCULAR LENS PLACEMENT ([facility]) RIGHT DIABETIC 8.04 01:01.7;  Surgeon: Galen Manila, MD;  Location: [facility];  Service: Ophthalmology;  Laterality: Right;  Diabetic   CATARACT EXTRACTION W/PHACO Left 10/07/2022   Procedure: CATARACT EXTRACTION PHACO AND INTRAOCULAR LENS PLACEMENT ([facility]) LEFT DIABETIC 4.93 00:40.7;  Surgeon: Galen Manila, MD;  Location: [facility];  Service: Ophthalmology;  Laterality: Left;  Diabetic   CESAREAN SECTION     COLONOSCOPY WITH PROPOFOL N/A 11/20/2021   Procedure: COLONOSCOPY WITH PROPOFOL;  Surgeon: Toney Reil, MD;  Location: [facility];  Service: Gastroenterology;  Laterality: N/A;   INCISION AND DRAINAGE ABSCESS Right 08/16/2021   Procedure: INCISION AND DRAINAGE ABDOMINAL WALL ABSCESS;  Surgeon: Campbell Lerner, MD;   Location: [facility];  Service: General;  Laterality: Right;   LAPAROSCOPIC TOTAL HYSTERECTOMY  11/2012   Social History   Tobacco Use   Smoking status: Former    Types: Cigarettes    Quit date: 07/22/1979    Years since quitting: 43.3    Passive exposure: Never   Smokeless tobacco: Never   Tobacco comments:    briefly smoked at 16.  (May occasionally have 1 cig)  Vaping Use   Vaping Use: Never used  Substance Use Topics   Alcohol use: Yes    Comment: rarely   Drug use: No   Family History  Problem Relation Age of Onset   Hypothyroidism Mother    Alzheimer's disease Mother    Stroke Father    Deep vein thrombosis Sister    Colon cancer Neg Hx    Esophageal cancer Neg Hx    Stomach cancer Neg Hx    Rectal cancer Neg Hx    Breast cancer Neg Hx    No Known Allergies Current Outpatient Medications on File Prior to Visit  Medication Sig Dispense Refill   atorvastatin (LIPITOR) 20 MG tablet Take 1 tablet (20 mg total) by mouth daily. 90 tablet 3   levothyroxine (SYNTHROID) 112 MCG tablet TAKE 1 TABLET BY MOUTH ONCE DAILY 30 tablet 4   losartan (COZAAR) 100 MG tablet TAKE 1/2 TABLET BY MOUTH DAILY 45 tablet 0   metFORMIN (GLUCOPHAGE) 1000 MG tablet TAKE 1 TABLET (1,000 MG TOTAL) BY MOUTH TWICE A DAY WITH FOOD 180 tablet 1   No current facility-administered medications on file prior to visit.    Review of Systems  Constitutional:  Negative for activity change, appetite change, fatigue, fever and unexpected weight change.  HENT:  Negative for congestion, rhinorrhea, sore throat and trouble swallowing.   Eyes:  Negative for pain, redness, itching and visual disturbance.  Respiratory:  Negative for cough, chest tightness, shortness of breath and wheezing.   Cardiovascular:  Negative for chest pain and palpitations.  Gastrointestinal:  Positive for nausea and vomiting. Negative for abdominal pain, blood in stool, constipation and diarrhea.       Indigestion   Endocrine: Negative for  cold intolerance, heat intolerance, polydipsia and polyuria.  Genitourinary:  Negative for difficulty urinating, dysuria, frequency and urgency.  Musculoskeletal:  Negative for arthralgias, joint swelling and myalgias.  Skin:  Negative for pallor and rash.  Neurological:  Negative for dizziness, tremors, weakness, numbness and headaches.  Hematological:  Negative for adenopathy. Does not bruise/bleed easily.  Psychiatric/Behavioral:  Negative for decreased concentration and dysphoric mood. The patient is not nervous/anxious.        Objective:   Physical Exam Constitutional:      General: She is not in acute distress.    Appearance: Normal appearance. She is well-developed. She is obese. She is not ill-appearing or diaphoretic.  HENT:     Head: Normocephalic and atraumatic.  Eyes:     Conjunctiva/sclera: Conjunctivae normal.     Pupils: Pupils are equal, round, and reactive to light.  Neck:     Thyroid: No thyromegaly.     Vascular: No carotid bruit or JVD.  Cardiovascular:     Rate and Rhythm: Normal rate and regular rhythm.     Heart sounds: Normal heart sounds.     No gallop.  Pulmonary:     Effort: Pulmonary effort is normal. No respiratory distress.     Breath sounds: Normal breath sounds. No wheezing or rales.  Abdominal:     General: There is no distension or abdominal bruit.     Palpations: Abdomen is soft.  Musculoskeletal:     Cervical back: Normal range of motion and neck supple.     Right lower leg: No edema.     Left lower leg: No edema.  Lymphadenopathy:     Cervical: No cervical adenopathy.  Skin:    General: Skin is warm and dry.     Coloration: Skin is not pale.     Findings: No rash.  Neurological:     Mental Status: She is alert.     Coordination: Coordination normal.     Deep Tendon Reflexes: Reflexes are normal and symmetric. Reflexes normal.  Psychiatric:        Mood and Affect: Mood normal.           Assessment & Plan:   Problem List  Items Addressed This Visit       Cardiovascular and Mediastinum   Essential hypertension    bp in fair control at this time  BP Readings from Last 1 Encounters:  11/04/22 120/85  No changes needed Most recent labs reviewed  Disc lifstyle change with low sodium diet and exercise  Plan to continue losartan 50 mg daily          Endocrine   DM type 2 (diabetes mellitus, type 2) - Primary    Lab Results  Component Value Date   HGBA1C 8.5 (H) 09/15/2022  Tolerated trulicity at 0.75 mg weekly but not 1.5 (GI intol) Will drop back to 0.75 Continue metformin 1000 mg bid  Add back glimiperide at lower dose- 4 mg   (since her glucose readings are still in 200s even with Trulicity and less food )  Plan to f/u with pharmacist F/u here early June for A1c and visit   Disc low glycemic diet today  ? If understands protein/ good choices  Referral done to DM education class at [facility] -pt is motivated  Will be able to get back to exercise/ walking when GI side eff stop  Inst pt to call if she does not tolerate 0.75 mg dose of trulicity - but expect that this will not be a problem since she tolerated it before  Continue ace and statin  Eye exam is utd       Relevant Medications   glimepiride (AMARYL) 4 MG tablet   Dulaglutide (TRULICITY) 0.75 MG/0.5ML SOPN   Other Relevant Orders   Ambulatory referral to diabetic education   Hyperlipidemia associated with type 2 diabetes mellitus    Disc goals for lipids and reasons to control them Rev last labs with pt Rev low sat fat diet in detail Atorvastatin 20  May have to inc next time to get LDLcloser to goal       Relevant Medications   glimepiride (AMARYL) 4 MG tablet  Dulaglutide (TRULICITY) 0.75 MG/0.5ML SOPN     Other   Class 2 obesity due to excess calories with body mass index (BMI) of 35.0 to 35.9 in adult    Unable to tolerate higher dose of trulicity so will back down   Discussed how this problem influences overall health and  the risks it imposes  Reviewed plan for weight loss with lower calorie diet (via better food choices and also portion control or program like weight watchers) and exercise building up to or more than 30 minutes 5 days per week including some aerobic activity   Once feeling better should get back to walking Was also ref to DM teaching to learn about food choices / needs more protein       Relevant Medications   glimepiride (AMARYL) 4 MG tablet   Dulaglutide (TRULICITY) 0.75 MG/0.5ML SOPN

## 2022-11-06 ENCOUNTER — Encounter: Payer: Self-pay | Admitting: *Deleted

## 2022-11-06 ENCOUNTER — Other Ambulatory Visit: Payer: 59 | Admitting: *Deleted

## 2022-11-06 NOTE — Patient Instructions (Signed)
Visit Information  Taylor Grimes was given information about Medicaid Managed Care team care coordination services as a part of their [facility] Community Plan Medicaid benefit. Taylor Grimes verbally consented to engagement with the [facility] Managed Care team.   If you are experiencing a medical emergency, please call 911 or report to your local emergency department or urgent care.   If you have a non-emergency medical problem during routine business hours, please contact your provider's office and ask to speak with a nurse.   For questions related to your [facility], please call: [phone] or visit the homepage here: [email or web address]  If you would like to schedule transportation through your [facility], please call the following number at least 2 days in advance of your appointment: [phone]   Rides for urgent appointments can also be made after hours by calling Member Services.  Call the Behavioral Health Crisis Line at [phone], at any time, 24 hours a day, 7 days a week. If you are in danger or need immediate medical attention call 911.  If you would like help to quit smoking, call 1-800-QUIT-NOW (423-084-8562) OR Espaol: 1-855-Djelo-Ya (7-253-664-4034) o para ms informacin haga clic aqu or Text READY to 742-595 to register via text  Taylor Grimes,   Please see education materials related to diabetes provided by MyChart link.  Patient verbalizes understanding of instructions and care plan provided today and agrees to view in MyChart. Active MyChart status and patient understanding of how to access instructions and care plan via MyChart confirmed with patient.     Telephone follow up appointment with Managed Medicaid care management team member scheduled for:12/22/22 @ 3:30pm  Taylor Emms RN, BSN [place]  Managed [facility] RN Care  Coordinator [phone]   Following is a copy of your plan of care:  Care Plan : RN Care Manager Plan of Care  Updates made by Heidi Dach, RN since 11/06/2022 12:00 AM     Problem: Health Management needs related to DMII      Long-Range Goal: Development of Plan of Care to address Health Management needs related to DMII   Start Date: 09/22/2022  Expected End Date: 12/21/2022  Note:   Current Barriers:  Chronic Disease Management support and education needs related to DMII  RNCM Clinical Goal(s):  Patient will verbalize understanding of plan for management of DMII as evidenced by patient reports take all medications exactly as prescribed and will call provider for medication related questions as evidenced by patient reports and EMR documentation    attend all scheduled medical appointments: 12/01/22 with TFC, 12/03/22 with [facility] Pharmacist and 12/22/22 with PCP as evidenced by provider documentation        through collaboration with RN Care manager, provider, and care team.   Interventions: Inter-disciplinary care team collaboration (see longitudinal plan of care) Evaluation of current treatment plan related to  self management and patient's adherence to plan as established by provider   Diabetes:  (Status: New goal.) Long Term Goal   Lab Results  Component Value Date   HGBA1C 8.5 (H) 09/15/2022   @ Assessed patient's understanding of A1c goal: <7% Provided education to patient about basic DM disease process; Reviewed medications with patient and discussed importance of medication adherence;        Reviewed prescribed diet with patient limit carbohydrates, increase lean protein, vegetables and fruits; Counseled on importance of regular laboratory monitoring as prescribed;        Reviewed  scheduled/upcoming provider appointments including: 12/01/22 with TFC, 12/03/22 with [facility] Pharmacist and 12/22/22 with PCP;         call provider for findings outside established parameters;       Review  of patient status, including review of consultants reports, relevant laboratory and other test results, and medications completed;       Discussed Hypoglycemia and hyperglycemia, provided education via MyChart Discussed Diabetic Education referral-advised patient to schedule and attend   Patient Goals/Self-Care Activities: Take medications as prescribed   Attend all scheduled provider appointments Call provider office for new concerns or questions  drink 6 to 8 glasses of water each day fill half of plate with vegetables manage portion size Increase exercise to 150 minutes/week

## 2022-11-06 NOTE — Patient Outreach (Signed)
Medicaid Managed Care   Nurse Care Manager Note  11/06/2022 Name:  Taylor Grimes MRN:  [id number] DOB:  Apr 08, 1961  Taylor Grimes is an 62 y.o. year old female who is a primary patient of Tower, Audrie Gallus, MD.  The Medicaid Managed Care Coordination team was consulted for assistance with:    DMII  Ms. Katich was given information about Medicaid Managed Care Coordination team services today. Taylor Grimes Patient agreed to services and verbal consent obtained.  Engaged with patient by telephone for follow up visit in response to provider referral for case management and/or care coordination services.   Assessments/Interventions:  Review of past medical history, allergies, medications, health status, including review of consultants reports, laboratory and other test data, was performed as part of comprehensive evaluation and provision of chronic care management services.  SDOH (Social Determinants of Health) assessments and interventions performed: SDOH Interventions    Flowsheet Row Patient Outreach Telephone from 09/22/2022 in [place] [facility] Office Visit from 02/15/2019 in [facility]  SDOH Interventions    Food Insecurity Interventions Intervention Not Indicated --  Housing Interventions Intervention Not Indicated --  Transportation Interventions Intervention Not Indicated --  Utilities Interventions Intervention Not Indicated --  Depression Interventions/Treatment  -- Medication       Care Plan  No Known Allergies  Medications Reviewed Today     Reviewed by Heidi Dach, RN (Registered Nurse) on 11/06/22 at 1326  Med List Status: <None>   Medication Order Taking? Sig Documenting Provider Last Dose Status Informant  atorvastatin (LIPITOR) 20 MG tablet [id number] Yes Take 1 tablet (20 mg total) by mouth daily. Tower, Audrie Gallus, MD Taking Active   Dulaglutide (TRULICITY) 0.75 MG/0.5ML SOPN [id number] Yes Inject 0.75 mg into  the skin once a week. Tower, Audrie Gallus, MD Taking Active   glimepiride (AMARYL) 4 MG tablet [id number] Yes Take 1 tablet (4 mg total) by mouth daily before breakfast. Tower, Audrie Gallus, MD Taking Active   levothyroxine (SYNTHROID) 112 MCG tablet [id number] Yes TAKE 1 TABLET BY MOUTH ONCE DAILY Carlus Pavlov, MD Taking Active   losartan (COZAAR) 100 MG tablet [id number] Yes TAKE 1/2 TABLET BY MOUTH DAILY Tower, Audrie Gallus, MD Taking Active   metFORMIN (GLUCOPHAGE) 1000 MG tablet [id number] Yes TAKE 1 TABLET (1,000 MG TOTAL) BY MOUTH TWICE A DAY WITH FOOD Tower, Audrie Gallus, MD Taking Active             Patient Active Problem List   Diagnosis Date Noted   Personal history of colonic polyps    Polyp of cecum    Yeast dermatitis 10/15/2021   Encounter for screening mammogram for breast cancer 10/04/2021   Hyperlipidemia associated with type 2 diabetes mellitus 08/17/2018   Hypertrophic toenail 02/02/2018   Anxiety disorder 09/12/2015   Low HDL (under 40) 10/24/2014   Routine general medical examination at a health care facility 03/08/2014   History of endometrial cancer 12/15/2012   Colon cancer screening 11/01/2012   DM type 2 (diabetes mellitus, type 2) 06/30/2012   Varicose veins of leg with swelling, left 11/18/2011   Class 2 obesity due to excess calories with body mass index (BMI) of 35.0 to 35.9 in adult 11/12/2010   Essential hypertension 07/12/2009   Hypothyroidism (acquired) 05/16/2009    Conditions to be addressed/monitored per PCP order:  DMII  Care Plan : RN Care Manager Plan of Care  Updates made by Heidi Dach, RN since  11/06/2022 12:00 AM     Problem: Health Management needs related to DMII      Long-Range Goal: Development of Plan of Care to address Health Management needs related to DMII   Start Date: 09/22/2022  Expected End Date: 12/21/2022  Note:   Current Barriers:  Chronic Disease Management support and education needs related to DMII  RNCM Clinical Goal(s):   Patient will verbalize understanding of plan for management of DMII as evidenced by patient reports take all medications exactly as prescribed and will call provider for medication related questions as evidenced by patient reports and EMR documentation    attend all scheduled medical appointments: 12/01/22 with TFC, 12/03/22 with MM Pharmacist and 12/22/22 with PCP as evidenced by provider documentation        through collaboration with RN Care manager, provider, and care team.   Interventions: Inter-disciplinary care team collaboration (see longitudinal plan of care) Evaluation of current treatment plan related to  self management and patient's adherence to plan as established by provider   Diabetes:  (Status: New goal.) Long Term Goal   Lab Results  Component Value Date   HGBA1C 8.5 (H) 09/15/2022   @ Assessed patient's understanding of A1c goal: <7% Provided education to patient about basic DM disease process; Reviewed medications with patient and discussed importance of medication adherence;        Reviewed prescribed diet with patient limit carbohydrates, increase lean protein, vegetables and fruits; Counseled on importance of regular laboratory monitoring as prescribed;        Reviewed scheduled/upcoming provider appointments including: 12/01/22 with TFC, 12/03/22 with [facility] Pharmacist and 12/22/22 with PCP;         call provider for findings outside established parameters;       Review of patient status, including review of consultants reports, relevant laboratory and other test results, and medications completed;       Discussed Hypoglycemia and hyperglycemia, provided education via MyChart Discussed Diabetic Education referral-advised patient to schedule and attend   Patient Goals/Self-Care Activities: Take medications as prescribed   Attend all scheduled provider appointments Call provider office for new concerns or questions  drink 6 to 8 glasses of water each day fill half of plate  with vegetables manage portion size Increase exercise to 150 minutes/week       Follow Up:  Patient agrees to Care Plan and Follow-up.  Plan: The Managed Medicaid care management team will reach out to the patient again over the next 45 days.  Date/time of next scheduled RN care management/care coordination outreach:  12/22/22 @ 3:30pm  [name] RN, BSN [place]  Managed [facility] RN Care Coordinator [phone]

## 2022-11-12 DIAGNOSIS — H903 Sensorineural hearing loss, bilateral: Secondary | ICD-10-CM | POA: Diagnosis not present

## 2022-11-12 DIAGNOSIS — H6123 Impacted cerumen, bilateral: Secondary | ICD-10-CM | POA: Diagnosis not present

## 2022-11-20 ENCOUNTER — Other Ambulatory Visit: Payer: Self-pay | Admitting: Family Medicine

## 2022-11-20 ENCOUNTER — Ambulatory Visit: Payer: 59 | Admitting: Podiatry

## 2022-11-23 ENCOUNTER — Other Ambulatory Visit: Payer: Self-pay | Admitting: Gastroenterology

## 2022-11-23 ENCOUNTER — Other Ambulatory Visit: Payer: Self-pay | Admitting: Family Medicine

## 2022-11-23 DIAGNOSIS — K640 First degree hemorrhoids: Secondary | ICD-10-CM

## 2022-12-01 ENCOUNTER — Ambulatory Visit (INDEPENDENT_AMBULATORY_CARE_PROVIDER_SITE_OTHER): Payer: 59 | Admitting: Podiatry

## 2022-12-01 ENCOUNTER — Encounter: Payer: Self-pay | Admitting: Podiatry

## 2022-12-01 DIAGNOSIS — B351 Tinea unguium: Secondary | ICD-10-CM | POA: Diagnosis not present

## 2022-12-01 DIAGNOSIS — M79674 Pain in right toe(s): Secondary | ICD-10-CM | POA: Diagnosis not present

## 2022-12-01 DIAGNOSIS — E119 Type 2 diabetes mellitus without complications: Secondary | ICD-10-CM

## 2022-12-01 DIAGNOSIS — M79675 Pain in left toe(s): Secondary | ICD-10-CM | POA: Diagnosis not present

## 2022-12-01 NOTE — Progress Notes (Unsigned)
  Subjective:  Patient ID: Taylor Grimes, female    DOB: 23-Feb-1961,  MRN: [id number]  Taylor Grimes presents to clinic today for preventative diabetic foot care and painful elongated mycotic toenails 1-5 bilaterally which are tender when wearing enclosed shoe gear. Pain is relieved with periodic professional debridement.  Chief Complaint  Patient presents with   Nail Problem    DFC-Referring Provider Tower, Idamae Schuller A, MD,LOV:04/24,A1C:9,B/S:102      New problem(s): None.   PCP is Tower, Audrie Gallus, MD.  No Known Allergies  Review of Systems: Negative except as noted in the HPI.  Objective: No changes noted in today's physical examination. There were no vitals filed for this visit. Taylor Grimes is a pleasant 62 y.o. female obese in NAD. AAO x 3.  Vascular Examination: Capillary refill time immediate b/l. Vascular status intact b/l with palpable pedal pulses. Pedal hair present b/l. No edema. No pain with calf compression b/l. Skin temperature gradient WNL b/l.   Neurological Examination: Sensation grossly intact b/l with 10 gram monofilament. Vibratory sensation intact b/l.   Dermatological Examination: Pedal skin with normal turgor, texture and tone b/l.  No open wounds. No interdigital macerations.   Toenails 1-5 b/l thick, discolored, elongated with subungual debris and pain on dorsal palpation.   No hyperkeratotic nor porokeratotic lesions present on today's visit.  Musculoskeletal Examination: Normal muscle strength 5/5 to all lower extremity muscle groups bilaterally. No pain, crepitus or joint limitation noted with ROM b/l LE. No gross bony pedal deformities b/l. Patient ambulates independently without assistive aids.  Radiographs: None  Last A1c:      Latest Ref Rng & Units 09/15/2022    8:02 AM 06/23/2022    8:08 AM 03/17/2022    7:28 AM 12/05/2021    7:57 AM  Hemoglobin A1C  Hemoglobin-A1c 4.6 - 6.5 % 8.5  8.5  9.2  7.7    Assessment/Plan: 1. Pain due to  onychomycosis of toenails of both feet   2. Diabetes mellitus without complication [facility])     Patient was evaluated and treated. All patient's and/or POA's questions/concerns addressed on today's visit. Mycotic toenails 1-5 debrided in length and girth without incident. Continue soft, supportive shoe gear daily. Report any pedal injuries to medical professional. -Patient/POA to call should there be question/concern in the interim.   Return in about 3 months (around 03/03/2023).  Freddie Breech, DPM

## 2022-12-02 ENCOUNTER — Telehealth: Payer: Self-pay | Admitting: Family Medicine

## 2022-12-02 MED ORDER — TRULICITY 0.75 MG/0.5ML ~~LOC~~ SOAJ: 0.7500 mg | SUBCUTANEOUS | 2 refills | Status: DC

## 2022-12-02 NOTE — Telephone Encounter (Signed)
Prescription Request  12/02/2022  LOV: 11/04/2022  What is the name of the medication or equipment? Dulaglutide (TRULICITY) 0.75 MG/0.5ML SOPN   Have you contacted your pharmacy to request a refill? Yes   Which pharmacy would you like this sent to?   [id number] [place] - [address] [place] [address] Phone: [phone] Fax: [phone]    Patient notified that their request is being sent to the clinical staff for review and that they should receive a response within 2 business days.   Please advise at [facility] [phone]

## 2022-12-02 NOTE — Telephone Encounter (Signed)
Last OV was on 11/04/22, next f/u 12/22/22.  Last filled on 11/04/22 # 6 mL with 1 refill

## 2022-12-03 ENCOUNTER — Other Ambulatory Visit: Payer: 59 | Admitting: Pharmacist

## 2022-12-08 NOTE — Telephone Encounter (Signed)
Patient called in and stated that she needs a prior auth before medication can be refilled. Thank you!

## 2022-12-09 ENCOUNTER — Other Ambulatory Visit (HOSPITAL_COMMUNITY): Payer: Self-pay

## 2022-12-10 ENCOUNTER — Other Ambulatory Visit: Payer: 59 | Admitting: Pharmacist

## 2022-12-10 ENCOUNTER — Other Ambulatory Visit (HOSPITAL_COMMUNITY): Payer: Self-pay

## 2022-12-10 NOTE — Progress Notes (Signed)
12/10/2022 Name: Taylor Grimes MRN: [id number] DOB: 1960-10-15  Chief Complaint  Patient presents with   Medication Management   Diabetes    Taylor Grimes is a 62 y.o. year old female who presented for a telephone visit.   They were referred to the pharmacist by their PCP for assistance in managing diabetes.   Patient is participating in a Managed Medicaid Plan:  Yes  Subjective:  Care Team: Primary Care Provider: Judy Pimple, MD ; Next Scheduled Visit: 12/22/22  Medication Access/Adherence  Current Pharmacy:  [facility] [phone] [place], [place] - [address] [place] [address] Phone: [phone] Fax: [phone]  [facility] [id number] [place], [place] - [address] [place] [address] Phone: [phone] Fax: [phone]  [id number] [place], [place] - [address] [place] [address] Phone: [phone] Fax: [phone]   Patient reports affordability concerns with their medications: No  Patient reports access/transportation concerns to their pharmacy: No  Patient reports adherence concerns with their medications:  Yes     Diabetes:  Current medications: metformin 1000 mg twice daily, glimepiride 4 mg daily, Trulicity 0.75 mg weekly -has been out for 2 weeks while waiting on prior authorization  Denies episodes of hypoglycemia since starting glimepiride. Notes sugar was improved before she ran out of Trulicity  Hypertension:  Current medications: losartan 100 mg daily   Hyperlipidemia/ASCVD Risk Reduction  Current lipid lowering medications: atorvastatin 20 mg daily   Objective:  Lab Results  Component Value Date   HGBA1C 8.5 (H) 09/15/2022    Lab Results  Component Value Date   CREATININE 0.81 09/15/2022   BUN 13 09/15/2022   NA 142 09/15/2022   K 4.2 09/15/2022   CL 104 09/15/2022   CO2 28 09/15/2022    Lab Results  Component Value Date   CHOL 191 09/15/2022    HDL 34.10 (L) 09/15/2022   LDLCALC 118 (H) 09/15/2022   LDLDIRECT 111.0 08/19/2019   TRIG 193.0 (H) 09/15/2022   CHOLHDL 6 09/15/2022    Medications Reviewed Today     Reviewed by Alden Hipp, RPH-CPP (Pharmacist) on 12/10/22 at [id number]  Med List Status: <None>   Medication Order Taking? Sig Documenting Provider Last Dose Status Informant  atorvastatin (LIPITOR) 20 MG tablet [id number]  Take 1 tablet (20 mg total) by mouth daily. Tower, Audrie Gallus, MD  Active   clotrimazole (LOTRIMIN) 1 % cream [id number]  APPLY 1 APPLICATION. TOPICALLY 2 (TWO) TIMES DAILY. Toney Reil, MD  Active   Dulaglutide (TRULICITY) 0.75 MG/0.5ML Namon Cirri [id number] No Inject 0.75 mg into the skin once a week.  Patient not taking: Reported on 12/10/2022   Judy Pimple, MD Not Taking Active   glimepiride (AMARYL) 4 MG tablet [id number] Yes Take 1 tablet (4 mg total) by mouth daily before breakfast. Judy Pimple, MD Taking Active   levothyroxine (SYNTHROID) 112 MCG tablet [id number]  TAKE 1 TABLET BY MOUTH ONCE DAILY Carlus Pavlov, MD  Active   losartan (COZAAR) 100 MG tablet [id number]  TAKE 1/2 TABLET BY MOUTH DAILY Tower, Audrie Gallus, MD  Active   metFORMIN (GLUCOPHAGE) 1000 MG tablet [id number] Yes TAKE 1 TABLET (1,000 MG TOTAL) BY MOUTH TWICE A DAY WITH FOOD Tower, Audrie Gallus, MD Taking Active               Assessment/Plan:   Diabetes: - Currently uncontrolled - Will collaborate  with CPhT on PA status.  - Recommend consideration for SGLT2 moving forward, especially if development of hypoglycemia  Hypertension: - Currently controlled - Recommend to continue current regimen   Hyperlipidemia/ASCVD Risk Reduction: - Currently uncontrolled but anticipate improvement - Recommend to continue current regimen at this time   Follow Up Plan: phone call in 6 weeks  Catie Eppie Gibson, PharmD, BCACP, CPP [facility] [phone]

## 2022-12-10 NOTE — Telephone Encounter (Signed)
PA has been submitted and is currently pending. Documenting in separate encounter.

## 2022-12-10 NOTE — Patient Instructions (Signed)
Ashyia,   We'll follow up on the status of the Trulicity prior authorization.   Call me with any issues, especially any low blood sugar readings.    Check your blood sugars twice daily:  1) Fasting, first thing in the morning before breakfast and  2) 2 hours after your largest meal.   For a goal A1c of less than 7%, goal fasting readings are less than 130 and goal 2 hour after meal readings are less than 180.    Take care!  Catie Eppie Gibson, PharmD, BCACP, CPP [facility] [phone]

## 2022-12-11 ENCOUNTER — Other Ambulatory Visit: Payer: Self-pay | Admitting: Family Medicine

## 2022-12-11 DIAGNOSIS — Z1231 Encounter for screening mammogram for malignant neoplasm of breast: Secondary | ICD-10-CM

## 2022-12-12 ENCOUNTER — Telehealth: Payer: Self-pay

## 2022-12-12 NOTE — Telephone Encounter (Signed)
Pharmacy Patient Advocate Encounter  Prior Authorization for Trulicity has been approved by [employer]).    PA # [id number] Effective dates: 12/10/2022 through 05/23/202

## 2022-12-12 NOTE — Telephone Encounter (Signed)
Left VM letting [facility] know PA was approved

## 2022-12-22 ENCOUNTER — Encounter: Payer: Self-pay | Admitting: Family Medicine

## 2022-12-22 ENCOUNTER — Ambulatory Visit (INDEPENDENT_AMBULATORY_CARE_PROVIDER_SITE_OTHER): Payer: 59 | Admitting: Family Medicine

## 2022-12-22 ENCOUNTER — Encounter: Payer: Self-pay | Admitting: *Deleted

## 2022-12-22 ENCOUNTER — Other Ambulatory Visit: Payer: 59 | Admitting: *Deleted

## 2022-12-22 VITALS — BP 124/76 | HR 70 | Temp 97.6°F | Ht 61.25 in | Wt 193.0 lb

## 2022-12-22 DIAGNOSIS — E785 Hyperlipidemia, unspecified: Secondary | ICD-10-CM

## 2022-12-22 DIAGNOSIS — E1169 Type 2 diabetes mellitus with other specified complication: Secondary | ICD-10-CM

## 2022-12-22 DIAGNOSIS — E119 Type 2 diabetes mellitus without complications: Secondary | ICD-10-CM

## 2022-12-22 DIAGNOSIS — I10 Essential (primary) hypertension: Secondary | ICD-10-CM | POA: Diagnosis not present

## 2022-12-22 DIAGNOSIS — Z7984 Long term (current) use of oral hypoglycemic drugs: Secondary | ICD-10-CM | POA: Diagnosis not present

## 2022-12-22 DIAGNOSIS — Z6835 Body mass index (BMI) 35.0-35.9, adult: Secondary | ICD-10-CM

## 2022-12-22 DIAGNOSIS — Z7985 Long-term (current) use of injectable non-insulin antidiabetic drugs: Secondary | ICD-10-CM

## 2022-12-22 LAB — POCT GLYCOSYLATED HEMOGLOBIN (HGB A1C): Hemoglobin A1C: 8 % — AB (ref 4.0–5.6)

## 2022-12-22 NOTE — Assessment & Plan Note (Signed)
The patient will observe these symptoms, and report promptly any worsening or unexpected persistence.  If well, may return prn.  Is back on her trulicity now after prolonged prior auth

## 2022-12-22 NOTE — Assessment & Plan Note (Signed)
Disc goals for lipids and reasons to control them Rev last labs with pt Rev low sat fat diet in detail Continues atorvastatin 20 mg daily  LDL of 118  If not improved in 3 months consider increase dose or change to crestor

## 2022-12-22 NOTE — Progress Notes (Signed)
Subjective:    Patient ID: Taylor Grimes, female    DOB: 08-Jul-1961, 62 y.o.   MRN: [id number]  HPI Pt presents for f/u of DM2 and chronic medical problems  Wt Readings from Last 3 Encounters:  12/22/22 193 lb (87.5 kg)  11/04/22 190 lb (86.2 kg)  10/07/22 196 lb (88.9 kg)   36.17 kg/m  Vitals:   12/22/22 0917  BP: 124/76  Pulse: 70  Temp: 97.6 F (36.4 C)  SpO2: 98%   Broke up with boyfriend (that was hard)   HTN bp is stable today  No cp or palpitations or headaches or edema  No side effects to medicines  BP Readings from Last 3 Encounters:  12/22/22 124/76  11/04/22 120/85  10/07/22 103/77    Losartan 50 mg daily   DM2 Lab Results  Component Value Date   HGBA1C 8.0 (A) 12/22/2022   This is down from 8.5  (9.2 in aug)   Trulicity 0.75 (did not tol the 1.5 mg dose)- just started back on it (prior auth took a long time)  Was out of it for a while   (sugars were in the 200)   Metformin 1000 mg bid  Glimiperide 4 mg   Back on all meds Am glucose 100-116  2 hours after a meal 135   Diet is good overall  Watching the fats   Avoiding white potato incl fries and chips  Bread - 1 pc / wheat bread  No pasta  Rice once per week  Avoids snack foods  No sweets  Occ a coke - infrequently  (the tiny can)    Sees pharmacist-- consider a trial of SGLT2 drug going forward  Next visit is 01/26/23  Sees podiatrist in [place]   Eye exam 08/2022   Lab Results  Component Value Date   MICROALBUR 2.6 (H) 03/17/2022    Exercise - mowing the yard  Working outside  Some walking   Cholesterol Atorvastatin 20 mg daily   Lab Results  Component Value Date   CHOL 191 09/15/2022   HDL 34.10 (L) 09/15/2022   LDLCALC 118 (H) 09/15/2022   LDLDIRECT 111.0 08/19/2019   TRIG 193.0 (H) 09/15/2022   CHOLHDL 6 09/15/2022    Patient Active Problem List   Diagnosis Date Noted   Personal history of colonic polyps    Polyp of cecum    Yeast dermatitis 10/15/2021    Encounter for screening mammogram for breast cancer 10/04/2021   Hyperlipidemia associated with type 2 diabetes mellitus (HCC) 08/17/2018   Hypertrophic toenail 02/02/2018   Anxiety disorder 09/12/2015   Low HDL (under 40) 10/24/2014   Routine general medical examination at a health care facility 03/08/2014   History of endometrial cancer 12/15/2012   Colon cancer screening 11/01/2012   DM type 2 (diabetes mellitus, type 2) (HCC) 06/30/2012   Varicose veins of leg with swelling, left 11/18/2011   Class 2 obesity due to excess calories with body mass index (BMI) of 35.0 to 35.9 in adult 11/12/2010   Essential hypertension 07/12/2009   Hypothyroidism (acquired) 05/16/2009   Past Medical History:  Diagnosis Date   Anxiety    Breast lesion    benign   Diabetes mellitus without complication (HCC)    Elevated transaminase level    ? fatty liver    History of endometrial cancer 12/15/2012   FIGO Grade 2 endometrioid adenocarcinoma with squamous differentiation involving the mucosa only.  From SGO Guidelines:  Recommended surveillance after treatment  of endometrial cancer includes a follow-up visit every 3-6 months for 2 years, then every 6 months for 3 years, and annually thereafter. Each follow-up visit should include a thorough patient history; elicitation and investigation of any new symptoms associated with recurrence, such as vaginal bleeding, pelvic pain, weight loss, or lethargy; and a thorough speculum, pelvic, and rectovaginal examination   HTN (hypertension)    Hypothyroid    UTI (urinary tract infection)    Past Surgical History:  Procedure Laterality Date   ABDOMINAL HYSTERECTOMY     BREAST BIOPSY     benign cyst 2011   CATARACT EXTRACTION W/PHACO Right 01/07/2022   Procedure: CATARACT EXTRACTION PHACO AND INTRAOCULAR LENS PLACEMENT (IOC) RIGHT DIABETIC 8.04 01:01.7;  Surgeon: Galen Manila, MD;  Location: [facility];  Service: Ophthalmology;  Laterality: Right;   Diabetic   CATARACT EXTRACTION W/PHACO Left 10/07/2022   Procedure: CATARACT EXTRACTION PHACO AND INTRAOCULAR LENS PLACEMENT (IOC) LEFT DIABETIC 4.93 00:40.7;  Surgeon: Galen Manila, MD;  Location: [facility];  Service: Ophthalmology;  Laterality: Left;  Diabetic   CESAREAN SECTION     COLONOSCOPY WITH PROPOFOL N/A 11/20/2021   Procedure: COLONOSCOPY WITH PROPOFOL;  Surgeon: Toney Reil, MD;  Location: [facility];  Service: Gastroenterology;  Laterality: N/A;   INCISION AND DRAINAGE ABSCESS Right 08/16/2021   Procedure: INCISION AND DRAINAGE ABDOMINAL WALL ABSCESS;  Surgeon: Campbell Lerner, MD;  Location: [facility];  Service: General;  Laterality: Right;   LAPAROSCOPIC TOTAL HYSTERECTOMY  11/2012   Social History   Tobacco Use   Smoking status: Former    Types: Cigarettes    Quit date: 07/22/1979    Years since quitting: 43.4    Passive exposure: Never   Smokeless tobacco: Never   Tobacco comments:    briefly smoked at 16.  (May occasionally have 1 cig)  Vaping Use   Vaping Use: Never used  Substance Use Topics   Alcohol use: Yes    Comment: rarely   Drug use: No   Family History  Problem Relation Age of Onset   Hypothyroidism Mother    Alzheimer's disease Mother    Stroke Father    Deep vein thrombosis Sister    Colon cancer Neg Hx    Esophageal cancer Neg Hx    Stomach cancer Neg Hx    Rectal cancer Neg Hx    Breast cancer Neg Hx    No Known Allergies Current Outpatient Medications on File Prior to Visit  Medication Sig Dispense Refill   atorvastatin (LIPITOR) 20 MG tablet Take 1 tablet (20 mg total) by mouth daily. 90 tablet 3   clotrimazole (LOTRIMIN) 1 % cream APPLY 1 APPLICATION. TOPICALLY 2 (TWO) TIMES DAILY. 30 g 0   Dulaglutide (TRULICITY) 0.75 MG/0.5ML SOPN Inject 0.75 mg into the skin once a week. 6 mL 2   glimepiride (AMARYL) 4 MG tablet Take 1 tablet (4 mg total) by mouth daily before breakfast. 90 tablet 3   levothyroxine (SYNTHROID)  112 MCG tablet TAKE 1 TABLET BY MOUTH ONCE DAILY 30 tablet 4   losartan (COZAAR) 100 MG tablet TAKE 1/2 TABLET BY MOUTH DAILY 45 tablet 0   metFORMIN (GLUCOPHAGE) 1000 MG tablet TAKE 1 TABLET (1,000 MG TOTAL) BY MOUTH TWICE A DAY WITH FOOD 180 tablet 1   No current facility-administered medications on file prior to visit.     Review of Systems  Constitutional:  Negative for activity change, appetite change, fatigue, fever and unexpected weight change.  HENT:  Negative for congestion, ear pain, rhinorrhea, sinus pressure and sore throat.   Eyes:  Negative for pain, redness and visual disturbance.  Respiratory:  Negative for cough, shortness of breath and wheezing.   Cardiovascular:  Negative for chest pain and palpitations.  Gastrointestinal:  Negative for abdominal pain, blood in stool, constipation and diarrhea.  Endocrine: Negative for polydipsia and polyuria.  Genitourinary:  Negative for dysuria, frequency and urgency.  Musculoskeletal:  Negative for arthralgias, back pain and myalgias.  Skin:  Negative for pallor and rash.  Allergic/Immunologic: Negative for environmental allergies.  Neurological:  Negative for dizziness, syncope and headaches.  Hematological:  Negative for adenopathy. Does not bruise/bleed easily.  Psychiatric/Behavioral:  Negative for decreased concentration and dysphoric mood. The patient is not nervous/anxious.        Objective:   Physical Exam Constitutional:      General: She is not in acute distress.    Appearance: Normal appearance. She is well-developed. She is obese. She is not ill-appearing.  HENT:     Head: Normocephalic and atraumatic.  Eyes:     Conjunctiva/sclera: Conjunctivae normal.     Pupils: Pupils are equal, round, and reactive to light.  Neck:     Thyroid: No thyromegaly.     Vascular: No carotid bruit or JVD.  Cardiovascular:     Rate and Rhythm: Normal rate and regular rhythm.     Heart sounds: Normal heart sounds.     No gallop.   Pulmonary:     Effort: Pulmonary effort is normal. No respiratory distress.     Breath sounds: Normal breath sounds. No wheezing or rales.  Abdominal:     General: There is no distension or abdominal bruit.     Palpations: Abdomen is soft.  Musculoskeletal:     Cervical back: Normal range of motion and neck supple.     Right lower leg: No edema.     Left lower leg: No edema.  Lymphadenopathy:     Cervical: No cervical adenopathy.  Skin:    General: Skin is warm and dry.     Coloration: Skin is not pale.     Findings: No rash.  Neurological:     Mental Status: She is alert.     Coordination: Coordination normal.     Deep Tendon Reflexes: Reflexes are normal and symmetric. Reflexes normal.  Psychiatric:        Mood and Affect: Mood normal.           Assessment & Plan:   Problem List Items Addressed This Visit       Cardiovascular and Mediastinum   Essential hypertension    bp in fair control at this time  BP Readings from Last 1 Encounters:  12/22/22 124/76  No changes needed Most recent labs reviewed  Disc lifstyle change with low sodium diet and exercise  Plan to continue losartan 50 mg daily        Relevant Orders   Comprehensive metabolic panel     Endocrine   Hyperlipidemia associated with type 2 diabetes mellitus (HCC)    Disc goals for lipids and reasons to control them Rev last labs with pt Rev low sat fat diet in detail Continues atorvastatin 20 mg daily  LDL of 118  If not improved in 3 months consider increase dose or change to crestor       Relevant Orders   Comprehensive metabolic panel   Lipid panel   DM type 2 (diabetes mellitus, type 2) (HCC) -  Primary    Lab Results  Component Value Date   HGBA1C 8.0 (A) 12/22/2022  Is back on trulicity  Metformin 1000 mg bid Glimiperide 4 mg daily  May consider SGLT 2 drug in future  Watching diet  Expect improvement on trulicity 0.75 (did not tolerat higher dose)  Sees pharm in July  Will  keep working on diet and exercise  Oph exam utd  F/u in 3 mo       Relevant Orders   POCT glycosylated hemoglobin (Hb A1C) (Completed)   Hemoglobin A1c     Other   Class 2 obesity due to excess calories with body mass index (BMI) of 35.0 to 35.9 in adult    The patient will observe these symptoms, and report promptly any worsening or unexpected persistence.  If well, may return prn.  Is back on her trulicity now after prolonged prior auth

## 2022-12-22 NOTE — Patient Instructions (Signed)
For exercise  Walk and /or do yard work Add some Runner, broadcasting/film/video to your routine, this is important for bone and brain health and can reduce your risk of falls and help your body use insulin properly and regulate weight  Light weights, exercise bands , and internet videos are a good way to start  Yoga (chair or regular), machines , floor exercises or a gym with machines are also good options    Meet with the pharmacist as planned   Keep working on a diabetic diet  Try to get most of your carbohydrates from produce (with the exception of white potatoes)  Eat less bread/pasta/rice/snack foods/cereals/sweets and other items from the middle of the grocery store (processed carbs)   Take care of yourself   Follow up in 3 months with fasting labs prior

## 2022-12-22 NOTE — Assessment & Plan Note (Signed)
bp in fair control at this time  BP Readings from Last 1 Encounters:  12/22/22 124/76   No changes needed Most recent labs reviewed  Disc lifstyle change with low sodium diet and exercise  Plan to continue losartan 50 mg daily

## 2022-12-22 NOTE — Patient Instructions (Signed)
Visit Information  Ms. Albach was given information about Medicaid Managed Care team care coordination services as a part of their [facility] Community Plan Medicaid benefit. Margarito Liner verbally consented to engagement with the [facility] Managed Care team.   If you are experiencing a medical emergency, please call 911 or report to your local emergency department or urgent care.   If you have a non-emergency medical problem during routine business hours, please contact your provider's office and ask to speak with a nurse.   For questions related to your [facility], please call: [phone] or visit the homepage here: [email or web address]  If you would like to schedule transportation through your [facility], please call the following number at least 2 days in advance of your appointment: [phone]   Rides for urgent appointments can also be made after hours by calling Member Services.  Call the Behavioral Health Crisis Line at [phone], at any time, 24 hours a day, 7 days a week. If you are in danger or need immediate medical attention call 911.  If you would like help to quit smoking, call 1-800-QUIT-NOW ((234)422-1792) OR Espaol: 1-855-Djelo-Ya (1-324-401-0272) o para ms informacin haga clic aqu or Text READY to 536-644 to register via text  Ms. Fullam,   Please see education materials related to DM provided by MyChart link.  Patient verbalizes understanding of instructions and care plan provided today and agrees to view in MyChart. Active MyChart status and patient understanding of how to access instructions and care plan via MyChart confirmed with patient.     Telephone follow up appointment with Managed Medicaid care management team member scheduled for:03/25/23 @ 3:30 pm  [name] RN, BSN [place]  Managed [facility] RN Care  Coordinator [phone]   Following is a copy of your plan of care:  Care Plan : RN Care Manager Plan of Care  Updates made by Heidi Dach, RN since 12/22/2022 12:00 AM     Problem: Health Management needs related to DMII      Long-Range Goal: Development of Plan of Care to address Health Management needs related to DMII   Start Date: 09/22/2022  Expected End Date: 03/25/2023  Note:   Current Barriers:  Chronic Disease Management support and education needs related to DMII  RNCM Clinical Goal(s):  Patient will verbalize understanding of plan for management of DMII as evidenced by patient reports take all medications exactly as prescribed and will call provider for medication related questions as evidenced by patient reports and EMR documentation    attend all scheduled medical appointments: 12/24/22 for Mammogram, 01/26/23 with Pharmacist, 02/23/23 at [facility], 03/18/23 for lab and 03/25/23 with PCP as evidenced by provider documentation        through collaboration with RN Care manager, provider, and care team.   Interventions: Inter-disciplinary care team collaboration (see longitudinal plan of care) Evaluation of current treatment plan related to  self management and patient's adherence to plan as established by provider   Diabetes:  (Status: Goal on Track (progressing): YES.) Long Term Goal   Lab Results  Component Value Date   HGBA1C 8.0 (A) 12/22/2022   @ Assessed patient's understanding of A1c goal: <7% Provided education to patient about basic DM disease process; Reviewed medications with patient and discussed importance of medication adherence;        Reviewed prescribed diet with patient limit carbohydrates, increase lean protein, vegetables and fruits; Counseled on importance of regular laboratory monitoring as  prescribed;        Reviewed scheduled/upcoming provider appointments including: 12/24/22 for Mammogram, 01/26/23 with Pharmacist, 02/23/23 at [facility], 03/18/23 for lab and 03/25/23 with  PCP;         call provider for findings outside established parameters;       Review of patient status, including review of consultants reports, relevant laboratory and other test results, and medications completed;       Advised patient to reschedule Diabetic Education Reviewed and discussed provider notes Discussed A1C goal of <7%   Patient Goals/Self-Care Activities: Take medications as prescribed   Attend all scheduled provider appointments Call provider office for new concerns or questions  drink 6 to 8 glasses of water each day fill half of plate with vegetables manage portion size Increase exercise to 150 minutes/week

## 2022-12-22 NOTE — Assessment & Plan Note (Signed)
Lab Results  Component Value Date   HGBA1C 8.0 (A) 12/22/2022   Is back on trulicity  Metformin 1000 mg bid Glimiperide 4 mg daily  May consider SGLT 2 drug in future  Watching diet  Expect improvement on trulicity 0.75 (did not tolerat higher dose)  Sees pharm in July  Will keep working on diet and exercise  Oph exam utd  F/u in 3 mo

## 2022-12-22 NOTE — Patient Outreach (Signed)
Medicaid Managed Care   Nurse Care Manager Note  12/22/2022 Name:  Taylor Grimes MRN:  [id number] DOB:  August 30, 1960  Taylor Grimes is an 62 y.o. year old female who is a primary patient of Tower, Audrie Gallus, MD.  The Medicaid Managed Care Coordination team was consulted for assistance with:    DMII  Ms. Yeung was given information about Medicaid Managed Care Coordination team services today. Taylor Grimes Patient agreed to services and verbal consent obtained.  Engaged with patient by telephone for follow up visit in response to provider referral for case management and/or care coordination services.   Assessments/Interventions:  Review of past medical history, allergies, medications, health status, including review of consultants reports, laboratory and other test data, was performed as part of comprehensive evaluation and provision of chronic care management services.  SDOH (Social Determinants of Health) assessments and interventions performed: SDOH Interventions    Flowsheet Row Patient Outreach Telephone from 12/22/2022 in [employer] POPULATION HEALTH DEPARTMENT Patient Outreach Telephone from 09/22/2022 in [place] POPULATION HEALTH DEPARTMENT Office Visit from 02/15/2019 in [facility] [place]  SDOH Interventions     Food Insecurity Interventions Intervention Not Indicated Intervention Not Indicated --  Housing Interventions Intervention Not Indicated Intervention Not Indicated --  Transportation Interventions Intervention Not Indicated Intervention Not Indicated --  Utilities Interventions -- Intervention Not Indicated --  Depression Interventions/Treatment  -- -- Medication       Care Plan  No Known Allergies  Medications Reviewed Today     Reviewed by Heidi Dach, RN (Registered Nurse) on 12/22/22 at 1556  Med List Status: <None>   Medication Order Taking? Sig Documenting Provider Last Dose Status Informant  atorvastatin (LIPITOR) 20 MG tablet  [id number] No Take 1 tablet (20 mg total) by mouth daily. Tower, Audrie Gallus, MD Taking Active   clotrimazole (LOTRIMIN) 1 % cream [id number] No APPLY 1 APPLICATION. TOPICALLY 2 (TWO) TIMES DAILY. Toney Reil, MD Taking Active   Dulaglutide (TRULICITY) 0.75 MG/0.5ML SOPN [id number] No Inject 0.75 mg into the skin once a week. Tower, Audrie Gallus, MD Taking Active   glimepiride (AMARYL) 4 MG tablet [id number] No Take 1 tablet (4 mg total) by mouth daily before breakfast. Tower, Audrie Gallus, MD Taking Active   levothyroxine (SYNTHROID) 112 MCG tablet [id number] No TAKE 1 TABLET BY MOUTH ONCE DAILY Carlus Pavlov, MD Taking Active   losartan (COZAAR) 100 MG tablet [id number] No TAKE 1/2 TABLET BY MOUTH DAILY Tower, Audrie Gallus, MD Taking Active   metFORMIN (GLUCOPHAGE) 1000 MG tablet [id number] No TAKE 1 TABLET (1,000 MG TOTAL) BY MOUTH TWICE A DAY WITH FOOD Tower, Audrie Gallus, MD Taking Active             Patient Active Problem List   Diagnosis Date Noted   Personal history of colonic polyps    Polyp of cecum    Yeast dermatitis 10/15/2021   Encounter for screening mammogram for breast cancer 10/04/2021   Hyperlipidemia associated with type 2 diabetes mellitus (HCC) 08/17/2018   Hypertrophic toenail 02/02/2018   Anxiety disorder 09/12/2015   Low HDL (under 40) 10/24/2014   Routine general medical examination at a health care facility 03/08/2014   History of endometrial cancer 12/15/2012   Colon cancer screening 11/01/2012   DM type 2 (diabetes mellitus, type 2) (HCC) 06/30/2012   Varicose veins of leg with swelling, left 11/18/2011   Class 2 obesity due to excess calories with body mass index (BMI)  of 35.0 to 35.9 in adult 11/12/2010   Essential hypertension 07/12/2009   Hypothyroidism (acquired) 05/16/2009    Conditions to be addressed/monitored per PCP order:  DMII  Care Plan : RN Care Manager Plan of Care  Updates made by Heidi Dach, RN since 12/22/2022 12:00 AM     Problem: Health  Management needs related to DMII      Long-Range Goal: Development of Plan of Care to address Health Management needs related to DMII   Start Date: 09/22/2022  Expected End Date: 03/25/2023  Note:   Current Barriers:  Chronic Disease Management support and education needs related to DMII  RNCM Clinical Goal(s):  Patient will verbalize understanding of plan for management of DMII as evidenced by patient reports take all medications exactly as prescribed and will call provider for medication related questions as evidenced by patient reports and EMR documentation    attend all scheduled medical appointments: 12/24/22 for Mammogram, 01/26/23 with Pharmacist, 02/23/23 at [facility], 03/18/23 for lab and 03/25/23 with PCP as evidenced by provider documentation        through collaboration with RN Care manager, provider, and care team.   Interventions: Inter-disciplinary care team collaboration (see longitudinal plan of care) Evaluation of current treatment plan related to  self management and patient's adherence to plan as established by provider   Diabetes:  (Status: Goal on Track (progressing): YES.) Long Term Goal   Lab Results  Component Value Date   HGBA1C 8.0 (A) 12/22/2022   @ Assessed patient's understanding of A1c goal: <7% Provided education to patient about basic DM disease process; Reviewed medications with patient and discussed importance of medication adherence;        Reviewed prescribed diet with patient limit carbohydrates, increase lean protein, vegetables and fruits; Counseled on importance of regular laboratory monitoring as prescribed;        Reviewed scheduled/upcoming provider appointments including: 12/24/22 for Mammogram, 01/26/23 with Pharmacist, 02/23/23 at [facility], 03/18/23 for lab and 03/25/23 with PCP;         call provider for findings outside established parameters;       Review of patient status, including review of consultants reports, relevant laboratory and other test results, and  medications completed;       Advised patient to reschedule Diabetic Education Reviewed and discussed provider notes Discussed A1C goal of <7%   Patient Goals/Self-Care Activities: Take medications as prescribed   Attend all scheduled provider appointments Call provider office for new concerns or questions  drink 6 to 8 glasses of water each day fill half of plate with vegetables manage portion size Increase exercise to 150 minutes/week       Follow Up:  Patient agrees to Care Plan and Follow-up.  Plan: The Managed Medicaid care management team will reach out to the patient again over the next 90 days.  Date/time of next scheduled RN care management/care coordination outreach:  03/25/23 @ 3:30 pm  [name] RN, BSN [place]  Managed [facility] RN Care Coordinator [phone]

## 2022-12-24 ENCOUNTER — Ambulatory Visit
Admission: RE | Admit: 2022-12-24 | Discharge: 2022-12-24 | Disposition: A | Discharge status: Home / Self Care | Payer: 59 | Source: Ambulatory Visit | Attending: Family Medicine | Admitting: Family Medicine

## 2022-12-24 ENCOUNTER — Other Ambulatory Visit: Payer: Self-pay | Admitting: Internal Medicine

## 2022-12-24 DIAGNOSIS — Z1231 Encounter for screening mammogram for malignant neoplasm of breast: Secondary | ICD-10-CM | POA: Diagnosis not present

## 2022-12-25 ENCOUNTER — Other Ambulatory Visit: Payer: Self-pay | Admitting: Family Medicine

## 2022-12-25 DIAGNOSIS — R928 Other abnormal and inconclusive findings on diagnostic imaging of breast: Secondary | ICD-10-CM

## 2022-12-31 ENCOUNTER — Ambulatory Visit
Admission: RE | Admit: 2022-12-31 | Discharge: 2022-12-31 | Disposition: A | Discharge status: Home / Self Care | Payer: 59 | Source: Ambulatory Visit | Attending: Family Medicine | Admitting: Family Medicine

## 2022-12-31 ENCOUNTER — Other Ambulatory Visit: Payer: Self-pay | Admitting: Family Medicine

## 2022-12-31 DIAGNOSIS — N6041 Mammary duct ectasia of right breast: Secondary | ICD-10-CM | POA: Diagnosis not present

## 2022-12-31 DIAGNOSIS — R921 Mammographic calcification found on diagnostic imaging of breast: Secondary | ICD-10-CM | POA: Diagnosis not present

## 2022-12-31 DIAGNOSIS — N63 Unspecified lump in unspecified breast: Secondary | ICD-10-CM

## 2022-12-31 DIAGNOSIS — R928 Other abnormal and inconclusive findings on diagnostic imaging of breast: Secondary | ICD-10-CM | POA: Diagnosis present

## 2022-12-31 DIAGNOSIS — N6489 Other specified disorders of breast: Secondary | ICD-10-CM | POA: Diagnosis not present

## 2023-01-06 ENCOUNTER — Ambulatory Visit
Admission: RE | Admit: 2023-01-06 | Discharge: 2023-01-06 | Disposition: A | Discharge status: Home / Self Care | Payer: 59 | Source: Ambulatory Visit | Attending: Family Medicine | Admitting: Family Medicine

## 2023-01-06 DIAGNOSIS — N6311 Unspecified lump in the right breast, upper outer quadrant: Secondary | ICD-10-CM | POA: Diagnosis not present

## 2023-01-06 DIAGNOSIS — N63 Unspecified lump in unspecified breast: Secondary | ICD-10-CM

## 2023-01-06 DIAGNOSIS — R928 Other abnormal and inconclusive findings on diagnostic imaging of breast: Secondary | ICD-10-CM

## 2023-01-06 HISTORY — PX: BREAST BIOPSY: SHX20

## 2023-01-06 MED ORDER — LIDOCAINE-EPINEPHRINE 1 %-1:100000 IJ SOLN
5.0000 mL | Freq: Once | INTRAMUSCULAR | Status: AC
  Administered 2023-01-06: 5 mL

## 2023-01-06 MED ORDER — LIDOCAINE HCL 1 % IJ SOLN
3.0000 mL | Freq: Once | INTRAMUSCULAR | Status: AC
  Administered 2023-01-06: 3 mL

## 2023-01-14 ENCOUNTER — Encounter: Payer: Self-pay | Admitting: *Deleted

## 2023-01-14 DIAGNOSIS — N6489 Other specified disorders of breast: Secondary | ICD-10-CM

## 2023-01-14 NOTE — Progress Notes (Signed)
Referral recieved from [facility] for benign breast mass.  Referral sent to Dr. Claudine Mouton, she is a previous patient of his and would like to see him for consultation.   Their office will call with an appt.

## 2023-01-15 ENCOUNTER — Encounter: Payer: Self-pay | Admitting: Surgery

## 2023-01-15 ENCOUNTER — Ambulatory Visit (INDEPENDENT_AMBULATORY_CARE_PROVIDER_SITE_OTHER): Payer: 59 | Admitting: Surgery

## 2023-01-15 VITALS — BP 134/87 | HR 78 | Temp 98.8°F | Ht 61.0 in | Wt 190.8 lb

## 2023-01-15 DIAGNOSIS — N6489 Other specified disorders of breast: Secondary | ICD-10-CM

## 2023-01-15 NOTE — Progress Notes (Signed)
Patient ID: Taylor Grimes, female   DOB: 1961/01/17, 62 y.o.   MRN: [id number]  Chief Complaint: Complex sclerosing lesion right breast  History of Present Illness SHAKEISHA HORINE is a 62 y.o. female with an abnormality which was identified on screening imaging.  She underwent ultrasound-guided spring-loaded core biopsy.  Biopsy pathology noted below.  She denies any family history of breast cancer.  Past Medical History Past Medical History:  Diagnosis Date   Anxiety    Breast lesion    benign   Diabetes mellitus without complication (HCC)    Elevated transaminase level    ? fatty liver    History of endometrial cancer 12/15/2012   FIGO Grade 2 endometrioid adenocarcinoma with squamous differentiation involving the mucosa only.  From SGO Guidelines:  Recommended surveillance after treatment of endometrial cancer includes a follow-up visit every 3-6 months for 2 years, then every 6 months for 3 years, and annually thereafter. Each follow-up visit should include a thorough patient history; elicitation and investigation of any new symptoms associated with recurrence, such as vaginal bleeding, pelvic pain, weight loss, or lethargy; and a thorough speculum, pelvic, and rectovaginal examination   HTN (hypertension)    Hypothyroid    UTI (urinary tract infection)       Past Surgical History:  Procedure Laterality Date   ABDOMINAL HYSTERECTOMY     BREAST BIOPSY     benign cyst 2011   BREAST BIOPSY Right 01/06/2023   [place] RT BREAST BX W LOC DEV 1ST LESION IMG BX SPEC US GUIDE 01/06/2023 [facility]   CATARACT EXTRACTION W/PHACO Right 01/07/2022   Procedure: CATARACT EXTRACTION PHACO AND INTRAOCULAR LENS PLACEMENT (IOC) RIGHT DIABETIC 8.04 01:01.7;  Surgeon: Galen Manila, MD;  Location: [facility];  Service: Ophthalmology;  Laterality: Right;  Diabetic   CATARACT EXTRACTION W/PHACO Left 10/07/2022   Procedure: CATARACT EXTRACTION PHACO AND INTRAOCULAR LENS PLACEMENT (IOC) LEFT  DIABETIC 4.93 00:40.7;  Surgeon: Galen Manila, MD;  Location: [facility];  Service: Ophthalmology;  Laterality: Left;  Diabetic   CESAREAN SECTION     COLONOSCOPY WITH PROPOFOL N/A 11/20/2021   Procedure: COLONOSCOPY WITH PROPOFOL;  Surgeon: Toney Reil, MD;  Location: [facility];  Service: Gastroenterology;  Laterality: N/A;   INCISION AND DRAINAGE ABSCESS Right 08/16/2021   Procedure: INCISION AND DRAINAGE ABDOMINAL WALL ABSCESS;  Surgeon: Campbell Lerner, MD;  Location: [facility];  Service: General;  Laterality: Right;   LAPAROSCOPIC TOTAL HYSTERECTOMY  11/2012    No Known Allergies  Current Outpatient Medications  Medication Sig Dispense Refill   atorvastatin (LIPITOR) 20 MG tablet Take 1 tablet (20 mg total) by mouth daily. 90 tablet 3   clotrimazole (LOTRIMIN) 1 % cream APPLY 1 APPLICATION. TOPICALLY 2 (TWO) TIMES DAILY. 30 g 0   Dulaglutide (TRULICITY) 0.75 MG/0.5ML SOPN Inject 0.75 mg into the skin once a week. 6 mL 2   glimepiride (AMARYL) 4 MG tablet Take 1 tablet (4 mg total) by mouth daily before breakfast. 90 tablet 3   levothyroxine (SYNTHROID) 112 MCG tablet TAKE 1 TABLET BY MOUTH EVERY DAY 30 tablet 4   losartan (COZAAR) 100 MG tablet TAKE 1/2 TABLET BY MOUTH DAILY 45 tablet 0   metFORMIN (GLUCOPHAGE) 1000 MG tablet TAKE 1 TABLET (1,000 MG TOTAL) BY MOUTH TWICE A DAY WITH FOOD 180 tablet 1   No current facility-administered medications for this visit.    Family History Family History  Problem Relation Age of Onset   Hypothyroidism Mother  Alzheimer's disease Mother    Stroke Father    Deep vein thrombosis Sister    Colon cancer Neg Hx    Esophageal cancer Neg Hx    Stomach cancer Neg Hx    Rectal cancer Neg Hx    Breast cancer Neg Hx       Social History Social History   Tobacco Use   Smoking status: Former    Types: Cigarettes    Quit date: 07/22/1979    Years since quitting: 43.5    Passive exposure: Never   Smokeless tobacco:  Never   Tobacco comments:    briefly smoked at 16.  (May occasionally have 1 cig)  Vaping Use   Vaping Use: Never used  Substance Use Topics   Alcohol use: Yes    Comment: rarely   Drug use: No        Review of Systems  All other systems reviewed and are negative.    Physical Exam Blood pressure 134/87, pulse 78, temperature 98.8 F (37.1 C), temperature source Oral, height 5\' 1"  (1.549 m), weight 190 lb 12.8 oz (86.5 kg), last menstrual period 07/21/2008, SpO2 98 %. Last Weight  Most recent update: 01/15/2023 10:42 AM    Weight  86.5 kg (190 lb 12.8 oz)             CONSTITUTIONAL: Well developed, and nourished, appropriately responsive and aware without distress.   EYES: Sclera non-icteric.   EARS, NOSE, MOUTH AND THROAT:  The oropharynx is clear. Oral mucosa is pink and moist.     Hearing is intact to voice.  NECK: Trachea is midline, and there is no jugular venous distension.  LYMPH NODES:  Lymph nodes in the neck are not appreciated. RESPIRATORY:  Normal respiratory effort without pathologic use of accessory muscles. CARDIOVASCULAR:  Well perfused.  GI: The abdomen is  soft, nontender, and nondistended. There were no palpable masses.  GU: Marylene Land present as chaperone.  Steri-Strips still present on the right breast.  There is no appreciable palpable abnormality in either breast.  No dominant nodularity, nor suspicious skin changes present. MUSCULOSKELETAL:  Symmetrical muscle tone appreciated in all four extremities.    SKIN: Skin turgor is normal. No pathologic skin lesions appreciated.  NEUROLOGIC:  Motor and sensation appear grossly normal.  Cranial nerves are grossly without defect. PSYCH:  Alert and oriented to person, place and time. Affect is appropriate for situation.  Data Reviewed I have personally reviewed what is currently available of the patient's imaging, recent labs and medical records.   Labs:     Latest Ref Rng & Units 09/15/2022    8:02 AM  09/06/2021    7:34 AM 08/16/2021   10:09 AM  CBC  WBC 4.0 - 10.5 K/uL 7.9  4.5  15.4   Hemoglobin 12.0 - 15.0 g/dL 84.1  32.4  40.1   Hematocrit 36.0 - 46.0 % 39.7  37.3  39.6   Platelets 150.0 - 400.0 K/uL 151.0  159.0  196       Latest Ref Rng & Units 09/15/2022    8:02 AM 03/17/2022    7:28 AM 12/05/2021    7:57 AM  CMP  Glucose 70 - 99 mg/dL 027  253  664   BUN 6 - 23 mg/dL 13  12  10    Creatinine 0.40 - 1.20 mg/dL 4.03  4.74  2.59   Sodium 135 - 145 mEq/L 142  138  141   Potassium 3.5 - 5.1 mEq/L 4.2  4.1  3.8   Chloride 96 - 112 mEq/L 104  103  102   CO2 19 - 32 mEq/L 28  27  30    Calcium 8.4 - 10.5 mg/dL 9.6  69.6  9.4   Total Protein 6.0 - 8.3 g/dL 6.7     Total Bilirubin 0.2 - 1.2 mg/dL 1.1     Alkaline Phos 39 - 117 U/L 104     AST 0 - 37 U/L 30     ALT 0 - 35 U/L 23         Imaging: Radiological images reviewed:  CLINICAL DATA:  Callback of the RIGHT breast   EXAM: DIGITAL DIAGNOSTIC UNILATERAL RIGHT MAMMOGRAM WITH TOMOSYNTHESIS; ULTRASOUND RIGHT BREAST LIMITED   TECHNIQUE: Right digital diagnostic mammography and breast tomosynthesis was performed.; Targeted ultrasound examination of the right breast was performed   COMPARISON:  Previous exam(s).   ACR Breast Density Category b: There are scattered areas of fibroglandular density.   FINDINGS: Spot magnification views of the RIGHT breast demonstrate scattered benign dystrophic calcifications and scattered punctate calcifications throughout the RIGHT breast. No suspicious grouping is identified. These calcifications are favored not significantly changed dating back to at least 2018. Additional diagnostic images confirms persistent tubular asymmetries in predominately the upper and upper outer RIGHT breast. These are mildly increased in conspicuity in comparison to prior.   On physical exam, no suspicious mass is appreciated.   Targeted ultrasound was performed of the RIGHT upper outer breast. There  are multiple areas of benign duct ectasia noted throughout the RIGHT upper outer breast with benign anechoic ducts. There is a possible intraductal mass versus intraductal debris noted at 12 o'clock 3 cm from the nipple. This measures 5 x 2 x 4 mm.   IMPRESSION: 1. Incidental sonographic note of a possible 5 mm intraductal mass versus debris in the RIGHT breast at 12 o'clock 3 cm from the nipple. Recommend ultrasound-guided biopsy for definitive characterization. 2. Otherwise, diffuse benign duct ectasia is noted.   RECOMMENDATION: RIGHT breast ultrasound-guided biopsy x1   I have discussed the findings and recommendations with the patient. The biopsy procedure was discussed with the patient and questions were answered. Patient expressed their understanding of the biopsy recommendation. Patient will be scheduled for biopsy at her earliest convenience by the schedulers. Ordering provider will be notified. If applicable, a reminder letter will be sent to the patient regarding the next appointment.   BI-RADS CATEGORY  4: Suspicious.     Electronically Signed   By: Meda Klinefelter M.D.   On: 12/31/2022 11:29 CLINICAL DATA:  Indeterminate possible intraductal mass   EXAM: ULTRASOUND GUIDED RIGHT BREAST CORE NEEDLE BIOPSY   COMPARISON:  Previous exam(s).   PROCEDURE: I met with the patient and we discussed the procedure of ultrasound-guided biopsy, including benefits and alternatives. We discussed the high likelihood of a successful procedure. We discussed the risks of the procedure, including infection, bleeding, tissue injury, clip migration, and inadequate sampling. Informed written consent was given. The usual time-out protocol was performed immediately prior to the procedure.   Lesion quadrant: Upper outer quadrant   Using sterile technique and with epi copy as local anesthetic, under direct ultrasound visualization, a 14 gauge spring-loaded device was used to  perform biopsy of 2 adjacent possible intraductal masses at 12 o'clock 3 cm from the nipple using an inferolateral approach. At the conclusion of the procedure a RIBBON shaped tissue marker clip was deployed into the biopsy cavity. Follow up 2 view  mammogram was performed and dictated separately.   IMPRESSION: Ultrasound guided biopsy of possible intraductal mass at 12 o'clock. No apparent complications.     Electronically Signed   By: Meda Klinefelter M.D.   On: 01/06/2023 15:07 CLINICAL DATA:  Status post ultrasound-guided biopsy   EXAM: 3D DIAGNOSTIC RIGHT MAMMOGRAM POST ULTRASOUND BIOPSY   COMPARISON:  Previous exam(s).   FINDINGS: 3D Mammographic images were obtained following ultrasound guided biopsy of a mass at 12 o'clock. The RIBBON biopsy marking clip is in expected position at the site of biopsy.   IMPRESSION: Appropriate positioning of the RIBBON shaped biopsy marking clip at the site of biopsy in the upper breast.   Final Assessment: Post Procedure Mammograms for Marker Placement     Electronically Signed   By: Meda Klinefelter M.D.   On: 01/06/2023 15:06  Assessment     Patient Active Problem List   Diagnosis Date Noted   Complex sclerosing lesion of right breast 01/15/2023   Personal history of colonic polyps    Polyp of cecum    Yeast dermatitis 10/15/2021   Encounter for screening mammogram for breast cancer 10/04/2021   Hyperlipidemia associated with type 2 diabetes mellitus (HCC) 08/17/2018   Hypertrophic toenail 02/02/2018   Anxiety disorder 09/12/2015   Low HDL (under 40) 10/24/2014   Routine general medical examination at a health care facility 03/08/2014   History of endometrial cancer 12/15/2012   Colon cancer screening 11/01/2012   DM type 2 (diabetes mellitus, type 2) (HCC) 06/30/2012   Varicose veins of leg with swelling, left 11/18/2011   Class 2 obesity due to excess calories with body mass index (BMI) of 35.0 to 35.9 in adult  11/12/2010   Essential hypertension 07/12/2009   Hypothyroidism (acquired) 05/16/2009    Plan    Options of proceeding with excision, utilizing a tag to localize this area discussed in detail.  Other options of continued observation were discussed with the associated risks reviewing the data below. She is elected that she would like to continue screening and observation, but willing to undergo biopsy should there be a change. Therefore we anticipate having her follow-up in 6 months with diagnostic mammography of the right breast.   Radial scars -- Complex Sclerosing Lesion (CSL) Radial scars, also called complex sclerosing lesions, are a pathologic diagnosis, usually discovered incidentally when a breast mass or radiologic abnormality is removed or biopsied. Occasionally, radial scars are large enough to be detected on mammography as suspicious spiculated masses, which cannot be reliably differentiated from spiculated carcinoma by imaging alone. Radial scars are characterized microscopically by a fibroelastic core with radiating ducts and lobules. In general, surgical excision is recommended when radial scars or complex sclerosing lesions are diagnosed on core biopsy, based on series showing that 8 to 17 percent of surgical specimens at subsequent excision are positive for malignancy. These high upgrade rates have led to the suggestion that these lesions may actually be premalignant lesions, progressing from scar to hyperplasia to carcinoma. This, however, is controversial, and more recent series suggest the presence of undiagnosed cancer is much lower, with upgrade rates in the range of 0.6 to 3.6 percent. The upgrade rate is even lower with large-volume (eg, vacuum-assisted) needle biopsy.   In a meta-analysis of over 3000 patients with radial scars, the upgrade rate after vacuum-assisted biopsy to invasive and in situ cancer was only 0 and 1 percent, respectively. In a study of 50 patients with  radial scar who were followed by active  surveillance, no patient progressed on interval imaging at 16 months. The current recommendations from the American Society of Breast Surgeons state that most radial scars "should be excised, although imaging follow-up is reasonable for small, image-detected radial scars that are completely removed or well-sampled with large-gauge devices and in the setting of imaging-pathology concordance".  No additional treatment beyond excision is needed for radial scars. The risk of subsequent breast cancer after excision in this population is small, and chemoprevention is not indicated.  Face-to-face time spent with the patient and accompanying care providers(if present) was 30 minutes, with more than 50% of the time spent counseling, educating, and coordinating care of the patient.    These notes generated with voice recognition software. I apologize for typographical errors.  Campbell Lerner M.D., FACS 01/15/2023, 11:27 AM

## 2023-01-15 NOTE — Patient Instructions (Signed)
If you have any concerns or questions, please feel free to call our office. Need Breast Ultrasound in 6 months.   Breast Self-Awareness  Breast self-awareness is knowing how your breasts look and feel. You need to: Check your breasts on a regular basis. Tell your doctor about any changes. Become familiar with the look and feel of your breasts. This can help you catch a breast problem while it is still small and can be treated. You should do breast self-exams even if you have breast implants. What you need: A mirror. A well-lit room. A pillow or other soft object. How to do a breast self-exam Follow these steps to do a breast self-exam: Look for changes  Take off all the clothes above your waist. Stand in front of a mirror in a room with good lighting. Put your hands down at your sides. Compare your breasts in the mirror. Look for any difference between them, such as: A difference in shape. A difference in size. Wrinkles, dips, and bumps in one breast and not the other. Look at each breast for changes in the skin, such as: Redness. Scaly areas. Skin that has gotten thicker. Dimpling. Open sores (ulcers). Look for changes in your nipples, such as: Fluid coming out of a nipple. Fluid around a nipple. Bleeding. Dimpling. Redness. A nipple that looks pushed in (retracted), or that has changed position. Feel for changes Lie on your back. Feel each breast. To do this: Pick a breast to feel. Place a pillow under the shoulder closest to that breast. Put the arm closest to that breast behind your head. Feel the nipple area of that breast using the hand of your other arm. Feel the area with the pads of your three middle fingers by making small circles with your fingers. Use light, medium, and firm pressure. Continue the overlapping circles, moving downward over the breast. Keep making circles with your fingers. Stop when you feel your ribs. Start making circles with your fingers  again, this time going upward until you reach your collarbone. Then, make circles outward across your breast and into your armpit area. Squeeze your nipple. Check for discharge and lumps. Repeat these steps to check your other breast. Sit or stand in the tub or shower. With soapy water on your skin, feel each breast the same way you did when you were lying down. Write down what you find Writing down what you find can help you remember what to tell your doctor. Write down: What is normal for each breast. Any changes you find in each breast. These include: The kind of changes you find. A tender or painful breast. Any lump you find. Write down its size and where it is. When you last had your monthly period (menstrual cycle). General tips If you are breastfeeding, the best time to check your breasts is after you feed your baby or after you use a breast pump. If you get monthly bleeding, the best time to check your breasts is 5-7 days after your monthly cycle ends. With time, you will become comfortable with the self-exam. You will also start to know if there are changes in your breasts. Contact a doctor if: You see a change in the shape or size of your breasts or nipples. You see a change in the skin of your breast or nipples, such as red or scaly skin. You have fluid coming from your nipples that is not normal. You find a new lump or thick area. You have breast  pain. You have any concerns about your breast health. Summary Breast self-awareness includes looking for changes in your breasts and feeling for changes within your breasts. You should do breast self-awareness in front of a mirror in a well-lit room. If you get monthly periods (menstrual cycles), the best time to check your breasts is 5-7 days after your period ends. Tell your doctor about any changes you see in your breasts. Changes include changes in size, changes on the skin, painful or tender breasts, or fluid from your nipples  that is not normal. This information is not intended to replace advice given to you by your health care provider. Make sure you discuss any questions you have with your health care provider. Document Revised: 12/12/2021 Document Reviewed: 05/09/2021 Elsevier Patient Education  2024 [employer].

## 2023-01-26 ENCOUNTER — Other Ambulatory Visit: Payer: 59 | Admitting: Pharmacist

## 2023-02-11 ENCOUNTER — Ambulatory Visit: Payer: 59 | Admitting: Dietician

## 2023-02-17 ENCOUNTER — Other Ambulatory Visit: Payer: 59 | Admitting: Pharmacist

## 2023-02-17 DIAGNOSIS — Z6836 Body mass index (BMI) 36.0-36.9, adult: Secondary | ICD-10-CM | POA: Diagnosis not present

## 2023-02-17 DIAGNOSIS — I951 Orthostatic hypotension: Secondary | ICD-10-CM | POA: Diagnosis not present

## 2023-02-17 DIAGNOSIS — F419 Anxiety disorder, unspecified: Secondary | ICD-10-CM | POA: Diagnosis not present

## 2023-02-17 DIAGNOSIS — Z87891 Personal history of nicotine dependence: Secondary | ICD-10-CM | POA: Diagnosis not present

## 2023-02-17 DIAGNOSIS — I10 Essential (primary) hypertension: Secondary | ICD-10-CM | POA: Diagnosis not present

## 2023-02-17 DIAGNOSIS — Z7984 Long term (current) use of oral hypoglycemic drugs: Secondary | ICD-10-CM | POA: Diagnosis not present

## 2023-02-17 DIAGNOSIS — Z008 Encounter for other general examination: Secondary | ICD-10-CM | POA: Diagnosis not present

## 2023-02-17 DIAGNOSIS — K5900 Constipation, unspecified: Secondary | ICD-10-CM | POA: Diagnosis not present

## 2023-02-17 DIAGNOSIS — J302 Other seasonal allergic rhinitis: Secondary | ICD-10-CM | POA: Diagnosis not present

## 2023-02-17 DIAGNOSIS — E1165 Type 2 diabetes mellitus with hyperglycemia: Secondary | ICD-10-CM | POA: Diagnosis not present

## 2023-02-17 DIAGNOSIS — Z7985 Long-term (current) use of injectable non-insulin antidiabetic drugs: Secondary | ICD-10-CM | POA: Diagnosis not present

## 2023-02-17 DIAGNOSIS — E785 Hyperlipidemia, unspecified: Secondary | ICD-10-CM | POA: Diagnosis not present

## 2023-02-17 NOTE — Progress Notes (Unsigned)
02/17/2023 Name: Taylor Grimes MRN: [id number] DOB: 03-Oct-1960  Chief Complaint  Patient presents with   Medication Management   Diabetes    Taylor Grimes is a 62 y.o. year old female who presented for a telephone visit.   They were referred to the pharmacist by their PCP for assistance in managing diabetes and medication access.    Subjective:  Care Team: Primary Care Provider: Judy Pimple, MD ; Next Scheduled Visit: 03/25/23  Medication Access/Adherence  Current Pharmacy:  [facility] [phone] [place], [place] - [address] [place] [address] Phone: [phone] Fax: [phone]  [facility] [phone] [place], [place] - [address] [place] [address] Phone: [phone] Fax: [phone]  [id number] - [place], [place] - [address] [place] [address] Phone: [phone] Fax: [phone]   Patient reports affordability concerns with their medications: No  Patient reports access/transportation concerns to their pharmacy: No  Patient reports adherence concerns with their medications:  No     Diabetes:  Current medications: metformin 1000 mg twice daily, Trulicity 0.75 mg weekly, glimepiride 4 mg daily   Current glucose readings: fastings 115-120; 130s evenings   Patient reports hypoglycemic s/sx including dizziness, shakiness, sweating. Notes an episode when she was at the store and felt lightheaded, sweaty.    Current meal patterns:  - Breakfast: egg and a piece of toast  - Lunch: peanut butter crackers; fruit; small more like a snack - Supper: salad, baked potato, grilled protein  Hypertension:  Current medications: losartan 100 mg daily   Patient has a validated, automated, upper arm home BP cuff Current blood pressure readings readings: has not been   Patient denies hypotensive s/sx including dizziness, lightheadedness.    Hyperlipidemia/ASCVD Risk Reduction  Current  lipid lowering medications: atorvastatin 20 mg daily  Objective:  Lab Results  Component Value Date   HGBA1C 8.0 (A) 12/22/2022    Lab Results  Component Value Date   CREATININE 0.81 09/15/2022   BUN 13 09/15/2022   NA 142 09/15/2022   K 4.2 09/15/2022   CL 104 09/15/2022   CO2 28 09/15/2022    Lab Results  Component Value Date   CHOL 191 09/15/2022   HDL 34.10 (L) 09/15/2022   LDLCALC 118 (H) 09/15/2022   LDLDIRECT 111.0 08/19/2019   TRIG 193.0 (H) 09/15/2022   CHOLHDL 6 09/15/2022    Medications Reviewed Today     Reviewed by Alden Hipp, RPH-CPP (Pharmacist) on 02/17/23 at 1031  Med List Status: <None>   Medication Order Taking? Sig Documenting Provider Last Dose Status Informant  atorvastatin (LIPITOR) 20 MG tablet [id number] Yes Take 1 tablet (20 mg total) by mouth daily. Tower, Audrie Gallus, MD Taking Active   clotrimazole (LOTRIMIN) 1 % cream [id number]  APPLY 1 APPLICATION. TOPICALLY 2 (TWO) TIMES DAILY. Toney Reil, MD  Active   Dulaglutide (TRULICITY) 0.75 MG/0.5ML Namon Cirri [id number] Yes Inject 0.75 mg into the skin once a week. Tower, Audrie Gallus, MD Taking Active   glimepiride (AMARYL) 4 MG tablet [id number] Yes Take 1 tablet (4 mg total) by mouth daily before breakfast. Tower, Audrie Gallus, MD Taking Active   levothyroxine (SYNTHROID) 112 MCG tablet [id number] Yes TAKE 1 TABLET BY MOUTH EVERY DAY Carlus Pavlov, MD Taking Active   losartan (COZAAR) 100 MG tablet [id number] Yes TAKE 1/2 TABLET BY MOUTH DAILY Tower, Audrie Gallus, MD Taking Active   metFORMIN (  GLUCOPHAGE) 1000 MG tablet [id number] Yes TAKE 1 TABLET (1,000 MG TOTAL) BY MOUTH TWICE A DAY WITH FOOD Tower, Audrie Gallus, MD Taking Active               Assessment/Plan:   Diabetes: - Currently controlled per home readings, but reporting hypoglycemic episodes  - Reviewed goal A1c, goal fasting, and goal 2 hour post prandial glucose - Reviewed dietary modifications including: focus on lean proteins, fruits  and vegetables, whole grains - Given reports of hypoglycemia, recommend to start Jardiance 10 mg daily and decrease glimepiride to 2 mg daily. Continue Trulicity 0.75 mg weekly (max tolerated dose) and metformin.   - Recommend to check glucose twice daily, fasting and 2 hour post prandial.   Hypertension: - Currently controlled - Reviewed long term cardiovascular and renal outcomes of uncontrolled blood pressure - Reviewed appropriate blood pressure monitoring technique and reviewed goal blood pressure. Recommended to check home blood pressure and heart rate periodically, write down readings, and bring to future follow up - Recommend to continue current regimen at this time  Hyperlipidemia/ASCVD Risk Reduction: - Currently uncontrolled but last lipid panel was on atorvastatin 10 mg daily - Recommend to continue current regimen. Lipid panel part of labs ordered for upcoming lab visit/PCP follow up    Follow Up Plan: PCP in ~ 6 weeks, PharmD in ~ 4 weeks after that  Catie Eppie Gibson, PharmD, BCACP, CPP Clinical Pharmacist [facility] [phone]

## 2023-02-17 NOTE — Patient Instructions (Signed)
[place],   It was great talking to you today!  Continue Trulicity 0.75 mg weekly. Decrease glimepiride to 1/2 of a tablet (2 mg) daily.   Start Jardiance 10 mg daily. Take this medication in the morning, as it can cause more frequent urination with more sugar in the urine. It may worsen risk for dehydration or genital infections. Focus on staying well hydrated and using good genital hygiene. Stop the medication and call our office if you develop any symptoms of genital infections, such as burning, itching, or pain while urinating or itching with redness that could be a yeast infection. If you have a day that you are vomiting or having diarrhea and you are very dehydrated, please hold this medication until you feel better.    Please reach out with any questions!  Catie Eppie Gibson, PharmD, BCACP, CPP Clinical Pharmacist [facility] [phone]

## 2023-02-18 MED ORDER — GLIMEPIRIDE 4 MG PO TABS: 2.0000 mg | ORAL_TABLET | Freq: Every day | ORAL | Status: DC

## 2023-02-18 MED ORDER — EMPAGLIFLOZIN 10 MG PO TABS: 10.0000 mg | ORAL_TABLET | Freq: Every day | ORAL | 2 refills | Status: DC

## 2023-02-19 ENCOUNTER — Other Ambulatory Visit: Payer: Self-pay | Admitting: Family Medicine

## 2023-03-05 ENCOUNTER — Encounter: Payer: Self-pay | Admitting: Podiatry

## 2023-03-05 ENCOUNTER — Encounter (INDEPENDENT_AMBULATORY_CARE_PROVIDER_SITE_OTHER): Payer: Self-pay

## 2023-03-05 ENCOUNTER — Ambulatory Visit (INDEPENDENT_AMBULATORY_CARE_PROVIDER_SITE_OTHER): Payer: 59 | Admitting: Podiatry

## 2023-03-05 DIAGNOSIS — B351 Tinea unguium: Secondary | ICD-10-CM

## 2023-03-05 DIAGNOSIS — M79675 Pain in left toe(s): Secondary | ICD-10-CM

## 2023-03-05 DIAGNOSIS — M79674 Pain in right toe(s): Secondary | ICD-10-CM | POA: Diagnosis not present

## 2023-03-05 DIAGNOSIS — E119 Type 2 diabetes mellitus without complications: Secondary | ICD-10-CM

## 2023-03-05 NOTE — Progress Notes (Signed)
  Subjective:  Patient ID: Taylor Grimes, female    DOB: Nov 21, 1960,  MRN: [id number]  Taylor Grimes presents to clinic today for: preventative diabetic foot care and painful thick toenails that are difficult to trim. Pain interferes with ambulation. Aggravating factors include wearing enclosed shoe gear. Pain is relieved with periodic professional debridement. Patient voices no new pedal concerns on today's visit. States she and her family will be going on a cruise in January. Chief Complaint  Patient presents with   Nail Problem    DFC, Tower, Audrie Gallus, MD,LOV:06/24,A1C:8.0,BS:120 today      PCP is Tower, Audrie Gallus, MD.  No Known Allergies  Review of Systems: Negative except as noted in the HPI.  Objective: No changes noted in today's physical examination. There were no vitals filed for this visit.  Taylor Grimes is a pleasant 62 y.o. female in NAD. AAO x 3.  Vascular Examination: Capillary refill time <3 seconds b/l LE. Palpable pedal pulses b/l LE. Digital hair present b/l. No pedal edema b/l. Skin temperature gradient WNL b/l. No varicosities b/l. No cyanosis or clubbing noted b/l LE.Marland Kitchen  Dermatological Examination: Pedal skin with normal turgor, texture and tone b/l. No open wounds. No interdigital macerations b/l. Toenails 1-5 b/l thickened, discolored, dystrophic with subungual debris. There is pain on palpation to dorsal aspect of nailplates. No corns, calluses nor porokeratotic lesions noted..  Neurological Examination: Protective sensation intact with 10 gram monofilament b/l LE. Vibratory sensation intact b/l LE.   Musculoskeletal Examination: Muscle strength 5/5 to all lower extremity muscle groups bilaterally. No pain, crepitus or joint limitation noted with ROM bilateral LE. No gross bony deformities bilaterally.     Latest Ref Rng & Units 12/22/2022    9:34 AM 09/15/2022    8:02 AM 06/23/2022    8:08 AM 03/17/2022    7:28 AM  Hemoglobin A1C  Hemoglobin-A1c 4.0 - 5.6 %  8.0  8.5  8.5  9.2    Assessment/Plan: 1. Pain due to onychomycosis of toenails of both feet   2. Diabetes mellitus without complication (HCC)    -Consent given for treatment as described below: -Examined patient. -No new findings. No new orders. -Continue supportive shoe gear daily. -Toenails 1-5 b/l were debrided in length and girth with sterile nail nippers and dremel without iatrogenic bleeding.  -Patient/POA to call should there be question/concern in the interim.   Return in about 3 months (around 06/05/2023).  Freddie Breech, DPM

## 2023-03-18 ENCOUNTER — Other Ambulatory Visit (INDEPENDENT_AMBULATORY_CARE_PROVIDER_SITE_OTHER): Payer: 59

## 2023-03-18 DIAGNOSIS — E119 Type 2 diabetes mellitus without complications: Secondary | ICD-10-CM | POA: Diagnosis not present

## 2023-03-18 DIAGNOSIS — E1169 Type 2 diabetes mellitus with other specified complication: Secondary | ICD-10-CM

## 2023-03-18 DIAGNOSIS — E785 Hyperlipidemia, unspecified: Secondary | ICD-10-CM

## 2023-03-18 DIAGNOSIS — I10 Essential (primary) hypertension: Secondary | ICD-10-CM | POA: Diagnosis not present

## 2023-03-18 LAB — HEMOGLOBIN A1C: Hgb A1c MFr Bld: 7.8 % — ABNORMAL HIGH (ref 4.6–6.5)

## 2023-03-18 LAB — COMPREHENSIVE METABOLIC PANEL
ALT: 26 U/L (ref 0–35)
AST: 34 U/L (ref 0–37)
Albumin: 3.6 g/dL (ref 3.5–5.2)
Alkaline Phosphatase: 89 U/L (ref 39–117)
BUN: 11 mg/dL (ref 6–23)
CO2: 26 mEq/L (ref 19–32)
Calcium: 9.7 mg/dL (ref 8.4–10.5)
Chloride: 106 mEq/L (ref 96–112)
Creatinine, Ser: 0.87 mg/dL (ref 0.40–1.20)
GFR: 70.55 mL/min (ref >60.00)
Glucose, Bld: 133 mg/dL — ABNORMAL HIGH (ref 70–99)
Potassium: 3.8 mEq/L (ref 3.5–5.1)
Sodium: 141 mEq/L (ref 135–145)
Total Bilirubin: 1 mg/dL (ref 0.2–1.2)
Total Protein: 7 g/dL (ref 6.0–8.3)

## 2023-03-18 LAB — LIPID PANEL
Cholesterol: 175 mg/dL (ref 0–200)
HDL: 35 mg/dL — ABNORMAL LOW (ref >39.00)
LDL Cholesterol: 102 mg/dL — ABNORMAL HIGH (ref 0–99)
NonHDL: 140.09
Total CHOL/HDL Ratio: 5
Triglycerides: 189 mg/dL — ABNORMAL HIGH (ref 0.0–149.0)
VLDL: 37.8 mg/dL (ref 0.0–40.0)

## 2023-03-25 ENCOUNTER — Ambulatory Visit (INDEPENDENT_AMBULATORY_CARE_PROVIDER_SITE_OTHER): Payer: 59 | Admitting: Family Medicine

## 2023-03-25 ENCOUNTER — Other Ambulatory Visit: Payer: 59 | Admitting: *Deleted

## 2023-03-25 ENCOUNTER — Encounter: Payer: Self-pay | Admitting: Family Medicine

## 2023-03-25 VITALS — BP 126/84 | HR 75 | Temp 97.3°F | Ht 61.25 in | Wt 188.8 lb

## 2023-03-25 DIAGNOSIS — E785 Hyperlipidemia, unspecified: Secondary | ICD-10-CM

## 2023-03-25 DIAGNOSIS — Z6835 Body mass index (BMI) 35.0-35.9, adult: Secondary | ICD-10-CM

## 2023-03-25 DIAGNOSIS — E1169 Type 2 diabetes mellitus with other specified complication: Secondary | ICD-10-CM | POA: Diagnosis not present

## 2023-03-25 DIAGNOSIS — E119 Type 2 diabetes mellitus without complications: Secondary | ICD-10-CM

## 2023-03-25 DIAGNOSIS — Z7985 Long-term (current) use of injectable non-insulin antidiabetic drugs: Secondary | ICD-10-CM | POA: Insufficient documentation

## 2023-03-25 LAB — MICROALBUMIN / CREATININE URINE RATIO
Creatinine,U: 40.3 mg/dL
Microalb Creat Ratio: 1.7 mg/g (ref 0.0–30.0)
Microalb, Ur: <0.7 mg/dL (ref 0.0–1.9)

## 2023-03-25 NOTE — Assessment & Plan Note (Signed)
Continues trulicity 0.75 mg weekly  Consider increase later depending on glucose control to help with weight loss

## 2023-03-25 NOTE — Assessment & Plan Note (Signed)
Discussed how this problem influences overall health and the risks it imposes  Reviewed plan for weight loss with lower calorie diet (via better food choices (lower glycemic and portion control) along with exercise building up to or more than 30 minutes 5 days per week including some aerobic activity and strength training   Hoping for more weight loss with trulicity  Diet is improved Appetite is down  Encouraged to add exercise

## 2023-03-25 NOTE — Progress Notes (Signed)
Subjective:    Patient ID: Taylor Grimes, female    DOB: 1961-05-12, 62 y.o.   MRN: [id number]  HPI  Wt Readings from Last 3 Encounters:  03/25/23 188 lb 12.8 oz (85.6 kg)  01/15/23 190 lb 12.8 oz (86.5 kg)  12/22/22 193 lb (87.5 kg)   35.38 kg/m  Vitals:   03/25/23 0828  BP: 126/84  Pulse: 75  Temp: (!) 97.3 F (36.3 C)  SpO2: 97%    Pt presents for follow up of DM2 , HTN and chronic medical problems   She broke up with her boyfriend since last visit  Was with him 11 years  Doing fine emotionally overall  Visited her sister 3 weeks Cruise in January   HTN bp is stable today  No cp or palpitations or headaches or edema  No side effects to medicines  BP Readings from Last 3 Encounters:  03/25/23 126/84  01/15/23 134/87  12/22/22 124/76     Losartan 100 mg daily    DM2 Lab Results  Component Value Date   HGBA1C 7.8 (H) 03/18/2023   Metformin 1000 mg bid  Glimiperide 4 mg daily - was decreased to 2 mg due to some hypoglycemic episodes (no more lows and doing ok)  Trulicity 0.75 mg weekly (tolerates well) - does decrease appetite  Jardiance 10 mg daily   Sees pharmacy as well- last 7/30   Am fasting - 110 average in the am  In evenings 140s  Thinks the jardiance is helping a lot (added early August)    Lab Results  Component Value Date   MICROALBUR <0.7 03/25/2023   MICROALBUR 2.6 (H) 03/17/2022  Due for that   Current meal patterns:  - Breakfast: egg and a piece of toast  - Lunch: peanut butter crackers; fruit; small more like a snack - Supper: salad, baked potato, grilled protein  Overall eats very well   For exercise- walks outside   Wants to get her flu shot at [facility] for free   Saw podiatry on 8/15    Hyperlipidemia Lab Results  Component Value Date   CHOL 175 03/18/2023   CHOL 191 09/15/2022   CHOL 189 03/17/2022   Lab Results  Component Value Date   HDL 35.00 (L) 03/18/2023   HDL 34.10 (L) 09/15/2022   HDL 33.50 (L)  03/17/2022   Lab Results  Component Value Date   LDLCALC 102 (H) 03/18/2023   LDLCALC 118 (H) 09/15/2022   LDLCALC 118 (H) 03/17/2022   Lab Results  Component Value Date   TRIG 189.0 (H) 03/18/2023   TRIG 193.0 (H) 09/15/2022   TRIG 188.0 (H) 03/17/2022   Lab Results  Component Value Date   CHOLHDL 5 03/18/2023   CHOLHDL 6 09/15/2022   CHOLHDL 6 03/17/2022   Lab Results  Component Value Date   LDLDIRECT 111.0 08/19/2019   LDLDIRECT 113.0 06/17/2017   Taking atorvastatin 20 mg daily   No beef  Some bacon 1-2 times per week 1 pc  No fried foods      Patient Active Problem List   Diagnosis Date Noted   Long-term current use of injectable noninsulin antidiabetic medication 03/25/2023   Complex sclerosing lesion of right breast 01/15/2023   Personal history of colonic polyps    Polyp of cecum    Yeast dermatitis 10/15/2021   Encounter for screening mammogram for breast cancer 10/04/2021   Hyperlipidemia associated with type 2 diabetes mellitus (HCC) 08/17/2018   Hypertrophic toenail 02/02/2018  Anxiety disorder 09/12/2015   Low HDL (under 40) 10/24/2014   Routine general medical examination at a health care facility 03/08/2014   History of endometrial cancer 12/15/2012   Colon cancer screening 11/01/2012   DM type 2 (diabetes mellitus, type 2) (HCC) 06/30/2012   Varicose veins of leg with swelling, left 11/18/2011   Class 2 obesity due to excess calories with body mass index (BMI) of 35.0 to 35.9 in adult 11/12/2010   Essential hypertension 07/12/2009   Hypothyroidism (acquired) 05/16/2009   Past Medical History:  Diagnosis Date   Anxiety    Breast lesion    benign   Diabetes mellitus without complication (HCC)    Elevated transaminase level    ? fatty liver    History of endometrial cancer 12/15/2012   FIGO Grade 2 endometrioid adenocarcinoma with squamous differentiation involving the mucosa only.  From SGO Guidelines:  Recommended surveillance after  treatment of endometrial cancer includes a follow-up visit every 3-6 months for 2 years, then every 6 months for 3 years, and annually thereafter. Each follow-up visit should include a thorough patient history; elicitation and investigation of any new symptoms associated with recurrence, such as vaginal bleeding, pelvic pain, weight loss, or lethargy; and a thorough speculum, pelvic, and rectovaginal examination   HTN (hypertension)    Hypothyroid    UTI (urinary tract infection)    Past Surgical History:  Procedure Laterality Date   ABDOMINAL HYSTERECTOMY     BREAST BIOPSY     benign cyst 2011   BREAST BIOPSY Right 01/06/2023   [place] RT BREAST BX W LOC DEV 1ST LESION IMG BX SPEC US GUIDE 01/06/2023 [facility]   CATARACT EXTRACTION W/PHACO Right 01/07/2022   Procedure: CATARACT EXTRACTION PHACO AND INTRAOCULAR LENS PLACEMENT (IOC) RIGHT DIABETIC 8.04 01:01.7;  Surgeon: Galen Manila, MD;  Location: [facility];  Service: Ophthalmology;  Laterality: Right;  Diabetic   CATARACT EXTRACTION W/PHACO Left 10/07/2022   Procedure: CATARACT EXTRACTION PHACO AND INTRAOCULAR LENS PLACEMENT (IOC) LEFT DIABETIC 4.93 00:40.7;  Surgeon: Galen Manila, MD;  Location: [facility];  Service: Ophthalmology;  Laterality: Left;  Diabetic   CESAREAN SECTION     COLONOSCOPY WITH PROPOFOL N/A 11/20/2021   Procedure: COLONOSCOPY WITH PROPOFOL;  Surgeon: Toney Reil, MD;  Location: [facility];  Service: Gastroenterology;  Laterality: N/A;   INCISION AND DRAINAGE ABSCESS Right 08/16/2021   Procedure: INCISION AND DRAINAGE ABDOMINAL WALL ABSCESS;  Surgeon: Campbell Lerner, MD;  Location: [facility];  Service: General;  Laterality: Right;   LAPAROSCOPIC TOTAL HYSTERECTOMY  11/2012   Social History   Tobacco Use   Smoking status: Former    Current packs/day: 0.00    Types: Cigarettes    Quit date: 07/22/1979    Years since quitting: 43.7    Passive exposure: Never   Smokeless tobacco:  Never   Tobacco comments:    briefly smoked at 16.  (May occasionally have 1 cig)  Vaping Use   Vaping status: Never Used  Substance Use Topics   Alcohol use: Yes    Comment: rarely   Drug use: No   Family History  Problem Relation Age of Onset   Hypothyroidism Mother    Alzheimer's disease Mother    Stroke Father    Deep vein thrombosis Sister    Colon cancer Neg Hx    Esophageal cancer Neg Hx    Stomach cancer Neg Hx    Rectal cancer Neg Hx    Breast cancer Neg Hx  No Known Allergies Current Outpatient Medications on File Prior to Visit  Medication Sig Dispense Refill   atorvastatin (LIPITOR) 20 MG tablet Take 1 tablet (20 mg total) by mouth daily. 90 tablet 3   clotrimazole (LOTRIMIN) 1 % cream APPLY 1 APPLICATION. TOPICALLY 2 (TWO) TIMES DAILY. 30 g 0   Dulaglutide (TRULICITY) 0.75 MG/0.5ML SOPN Inject 0.75 mg into the skin once a week. 6 mL 2   empagliflozin (JARDIANCE) 10 MG TABS tablet Take 1 tablet (10 mg total) by mouth daily before breakfast. 30 tablet 2   glimepiride (AMARYL) 4 MG tablet Take 0.5 tablets (2 mg total) by mouth daily before breakfast.     levothyroxine (SYNTHROID) 112 MCG tablet TAKE 1 TABLET BY MOUTH EVERY DAY 30 tablet 4   losartan (COZAAR) 100 MG tablet TAKE 1/2 TABLET BY MOUTH DAILY 15 tablet 2   metFORMIN (GLUCOPHAGE) 1000 MG tablet TAKE 1 TABLET (1,000 MG TOTAL) BY MOUTH TWICE A DAY WITH FOOD 180 tablet 1   No current facility-administered medications on file prior to visit.    Review of Systems  Constitutional:  Negative for activity change, appetite change, fatigue, fever and unexpected weight change.  HENT:  Negative for congestion, ear pain, rhinorrhea, sinus pressure and sore throat.   Eyes:  Negative for pain, redness and visual disturbance.  Respiratory:  Negative for cough, shortness of breath and wheezing.   Cardiovascular:  Negative for chest pain and palpitations.  Gastrointestinal:  Negative for abdominal pain, blood in stool,  constipation and diarrhea.  Endocrine: Negative for polydipsia and polyuria.  Genitourinary:  Negative for dysuria, frequency and urgency.  Musculoskeletal:  Negative for arthralgias, back pain and myalgias.  Skin:  Negative for pallor and rash.  Allergic/Immunologic: Negative for environmental allergies.  Neurological:  Negative for dizziness, syncope and headaches.  Hematological:  Negative for adenopathy. Does not bruise/bleed easily.  Psychiatric/Behavioral:  Negative for decreased concentration and dysphoric mood. The patient is not nervous/anxious.        Had a break up  Doing ok with it        Objective:   Physical Exam Constitutional:      General: She is not in acute distress.    Appearance: Normal appearance. She is well-developed. She is obese. She is not ill-appearing or diaphoretic.  HENT:     Head: Normocephalic and atraumatic.     Mouth/Throat:     Mouth: Mucous membranes are moist.  Eyes:     Conjunctiva/sclera: Conjunctivae normal.     Pupils: Pupils are equal, round, and reactive to light.  Neck:     Thyroid: No thyromegaly.     Vascular: No carotid bruit or JVD.  Cardiovascular:     Rate and Rhythm: Normal rate and regular rhythm.     Heart sounds: Normal heart sounds.     No gallop.  Pulmonary:     Effort: Pulmonary effort is normal. No respiratory distress.     Breath sounds: Normal breath sounds. No wheezing or rales.  Abdominal:     General: There is no distension or abdominal bruit.     Palpations: Abdomen is soft.  Musculoskeletal:     Cervical back: Normal range of motion and neck supple.     Right lower leg: No edema.     Left lower leg: No edema.  Lymphadenopathy:     Cervical: No cervical adenopathy.  Skin:    General: Skin is warm and dry.     Coloration: Skin is not  pale.     Findings: No rash.  Neurological:     Mental Status: She is alert.     Coordination: Coordination normal.     Deep Tendon Reflexes: Reflexes are normal and  symmetric. Reflexes normal.  Psychiatric:        Mood and Affect: Mood normal.           Assessment & Plan:   Problem List Items Addressed This Visit       Endocrine   DM type 2 (diabetes mellitus, type 2) (HCC) - Primary    Lab Results  Component Value Date   HGBA1C 7.8 (H) 03/18/2023   Improved from 8  Did decrease amaryl to 2 mg (hypoglycemia) and add jardiance 10 mg and since then (just over a month ago) much better readings/ close to goal Continues metformin 1000 mg id Trulicity 0.75 mg weekly  Working on weight loss  Eye care utd Good foot care/ nails trimmed by podiatry  Microalb ordered today  Expect next A1c to be much improved Discussed plan for exercise today      Relevant Orders   Microalbumin / creatinine urine ratio (Completed)   Hyperlipidemia associated with type 2 diabetes mellitus (HCC)    Disc goals for lipids and reasons to control them Rev last labs with pt Rev low sat fat diet in detail LDL 102 Goal of 70 or less  Will re check 3 mo If not improved can increase atorvastatin dose or change to rosuvastatin  Encouraged pt to stop eating bacon/fatty pork often          Other   Class 2 obesity due to excess calories with body mass index (BMI) of 35.0 to 35.9 in adult    Discussed how this problem influences overall health and the risks it imposes  Reviewed plan for weight loss with lower calorie diet (via better food choices (lower glycemic and portion control) along with exercise building up to or more than 30 minutes 5 days per week including some aerobic activity and strength training   Hoping for more weight loss with trulicity  Diet is improved Appetite is down  Encouraged to add exercise       Long-term current use of injectable noninsulin antidiabetic medication    Continues trulicity 0.75 mg weekly  Consider increase later depending on glucose control to help with weight loss

## 2023-03-25 NOTE — Patient Instructions (Addendum)
Aim to exercise 5 days per week -work up to 30 minutes per day  Walk  Add some strength training to your routine, this is important for bone and brain health and can reduce your risk of falls and help your body use insulin properly and regulate weight  Light weights, exercise bands , and internet videos are a good way to start  Yoga (chair or regular), machines , floor exercises or a gym with machines are also good options    [facility]'t [facility]   Keep up the good work with diet  Try to get most of your carbohydrates from produce (with the exception of white potatoes) and whole grains Eat less bread/pasta/rice/snack foods/cereals/sweets and other items from the middle of the grocery store (processed carbs)    For cholesterol Avoid red meat/ fried foods/ egg yolks/ fatty breakfast meats/ butter, cheese and high fat dairy/ and shellfish   Follow up in 3 months with fasting labs prior (you can eat something light if you need to)   Take care of yourself   Leave urine sample for urine protein test today

## 2023-03-25 NOTE — Patient Instructions (Signed)
Visit Information  Ms. Billmeyer was given information about Medicaid Managed Care team care coordination services as a part of their [facility] Community Plan Medicaid benefit. Margarito Liner verbally consented to engagement with the [facility] Managed Care team.   If you are experiencing a medical emergency, please call 911 or report to your local emergency department or urgent care.   If you have a non-emergency medical problem during routine business hours, please contact your provider's office and ask to speak with a nurse.   For questions related to your [facility], please call: [phone] or visit the homepage here: [email or web address]  If you would like to schedule transportation through your [facility], please call the following number at least 2 days in advance of your appointment: [phone]   Rides for urgent appointments can also be made after hours by calling Member Services.  Call the Behavioral Health Crisis Line at [phone], at any time, 24 hours a day, 7 days a week. If you are in danger or need immediate medical attention call 911.  If you would like help to quit smoking, call 1-800-QUIT-NOW (705-512-0075) OR Espaol: 1-855-Djelo-Ya (7-564-332-9518) o para ms informacin haga clic aqu or Text READY to 841-660 to register via text  Ms. Panella,   Please see education materials related to diabetes provided by MyChart link.  Patient verbalizes understanding of instructions and care plan provided today and agrees to view in MyChart. Active MyChart status and patient understanding of how to access instructions and care plan via MyChart confirmed with patient.     The  Patient                                              has been provided with contact information for the Managed Medicaid care management team and has been advised to call with any health related  questions or concerns.   [name] RN, BSN [place]  [facility] RN Care Coordinator [phone]   Following is a copy of your plan of care:  Care Plan : RN Care Manager Plan of Care  Updates made by Heidi Dach, RN since 03/25/2023 12:00 AM     Problem: Health Management needs related to DMII      Long-Range Goal: Development of Plan of Care to address Health Management needs related to DMII   Start Date: 09/22/2022  Expected End Date: 03/25/2023  Note:   Current Barriers:  Chronic Disease Management support and education needs related to DMII  RNCM Clinical Goal(s):  Patient will verbalize understanding of plan for management of DMII as evidenced by patient reports take all medications exactly as prescribed and will call provider for medication related questions as evidenced by patient reports and EMR documentation    attend all scheduled medical appointments: 12/24/22 for Mammogram, 01/26/23 with Pharmacist, 02/23/23 at [facility], 03/18/23 for lab and 03/25/23 with PCP as evidenced by provider documentation        through collaboration with RN Care manager, provider, and care team.   Interventions: Inter-disciplinary care team collaboration (see longitudinal plan of care) Evaluation of current treatment plan related to  self management and patient's adherence to plan as established by provider Discussed case closure, patient has been provided with education and will continue to work on improving diabetic management   Diabetes:  (  Status: Goal Not Met.) Long Term Goal   Lab Results  Component Value Date   HGBA1C 7.8 (H) 03/18/2023   @ Assessed patient's understanding of A1c goal: <7% Provided education to patient about basic DM disease process; Reviewed medications with patient and discussed importance of medication adherence;        Reviewed prescribed diet with patient limit carbohydrates, increase lean protein, vegetables and fruits; Counseled  on importance of regular laboratory monitoring as prescribed;        Reviewed scheduled/upcoming provider appointments including: 12/24/22 for Mammogram, 01/26/23 with Pharmacist, 02/23/23 at [facility], 03/18/23 for lab and 03/25/23 with PCP;         call provider for findings outside established parameters;       Review of patient status, including review of consultants reports, relevant laboratory and other test results, and medications completed;       Advised patient to reschedule Diabetic Education-patient declined Reviewed and discussed provider notes Discussed A1C goal of <7% Discussed LCSW referral for managing stress-patient declined   Patient Goals/Self-Care Activities: Take medications as prescribed   Attend all scheduled provider appointments Call provider office for new concerns or questions  drink 6 to 8 glasses of water each day fill half of plate with vegetables manage portion size Increase exercise to 150 minutes/week

## 2023-03-25 NOTE — Patient Outreach (Signed)
Care Management/Care Coordination  RN Case Manager Case Closure Note  03/25/2023 Name: Taylor Grimes MRN: [id number] DOB: Oct 29, 1960  Taylor Grimes is a 62 y.o. year old female who is a primary care patient of Tower, Audrie Gallus, MD. The care management/care coordination team was consulted for assistance with chronic disease management and/or care coordination needs.   Care Plan : RN Care Manager Plan of Care  Updates made by Heidi Dach, RN since 03/25/2023 12:00 AM     Problem: Health Management needs related to DMII      Long-Range Goal: Development of Plan of Care to address Health Management needs related to DMII   Start Date: 09/22/2022  Expected End Date: 03/25/2023  Note:   Current Barriers:  Chronic Disease Management support and education needs related to DMII  RNCM Clinical Goal(s):  Patient will verbalize understanding of plan for management of DMII as evidenced by patient reports take all medications exactly as prescribed and will call provider for medication related questions as evidenced by patient reports and EMR documentation    attend all scheduled medical appointments: 12/24/22 for Mammogram, 01/26/23 with Pharmacist, 02/23/23 at [facility], 03/18/23 for lab and 03/25/23 with PCP as evidenced by provider documentation        through collaboration with RN Care manager, provider, and care team.   Interventions: Inter-disciplinary care team collaboration (see longitudinal plan of care) Evaluation of current treatment plan related to  self management and patient's adherence to plan as established by provider Discussed case closure, patient has been provided with education and will continue to work on improving diabetic management   Diabetes:  (Status: Goal Not Met.) Long Term Goal   Lab Results  Component Value Date   HGBA1C 7.8 (H) 03/18/2023   @ Assessed patient's understanding of A1c goal: <7% Provided education to patient about basic DM disease process; Reviewed medications  with patient and discussed importance of medication adherence;        Reviewed prescribed diet with patient limit carbohydrates, increase lean protein, vegetables and fruits; Counseled on importance of regular laboratory monitoring as prescribed;        Reviewed scheduled/upcoming provider appointments including: 12/24/22 for Mammogram, 01/26/23 with Pharmacist, 02/23/23 at [facility], 03/18/23 for lab and 03/25/23 with PCP;         call provider for findings outside established parameters;       Review of patient status, including review of consultants reports, relevant laboratory and other test results, and medications completed;       Advised patient to reschedule Diabetic Education-patient declined Reviewed and discussed provider notes Discussed A1C goal of <7% Discussed LCSW referral for managing stress-patient declined   Patient Goals/Self-Care Activities: Take medications as prescribed   Attend all scheduled provider appointments Call provider office for new concerns or questions  drink 6 to 8 glasses of water each day fill half of plate with vegetables manage portion size Increase exercise to 150 minutes/week       Plan: All appropriate care management assessments have been performed and a patient centered care plan has been developed in collaboration with the patient subsequent to referral for care management services. The patient has been provided with exhaustive education and care management resources but has not met established and adjusted patient centered goals. The care management team has no additional activities or interventions to offer at this time.  Should new needs arise, the care management team is available to collaborate with the provider, care team and patient.  The patient has been provided contact information for the care management team.  [name] RN, BSN [place]  [facility] RN Care Coordinator [phone]

## 2023-03-25 NOTE — Assessment & Plan Note (Signed)
Disc goals for lipids and reasons to control them Rev last labs with pt Rev low sat fat diet in detail LDL 102 Goal of 70 or less  Will re check 3 mo If not improved can increase atorvastatin dose or change to rosuvastatin  Encouraged pt to stop eating bacon/fatty pork often

## 2023-03-25 NOTE — Assessment & Plan Note (Addendum)
Lab Results  Component Value Date   HGBA1C 7.8 (H) 03/18/2023   Improved from 8  Did decrease amaryl to 2 mg (hypoglycemia) and add jardiance 10 mg and since then (just over a month ago) much better readings/ close to goal Continues metformin 1000 mg id Trulicity 0.75 mg weekly  Working on weight loss  Eye care utd Good foot care/ nails trimmed by podiatry  Microalb ordered today  Expect next A1c to be much improved Discussed plan for exercise today

## 2023-04-01 ENCOUNTER — Encounter: Payer: Self-pay | Admitting: Pharmacist

## 2023-04-21 ENCOUNTER — Other Ambulatory Visit: Payer: 59 | Admitting: Pharmacist

## 2023-04-21 ENCOUNTER — Other Ambulatory Visit: Payer: Self-pay | Admitting: Family Medicine

## 2023-04-22 ENCOUNTER — Other Ambulatory Visit: Payer: 59 | Admitting: Pharmacist

## 2023-04-22 NOTE — Progress Notes (Signed)
04/27/2023 Name: [name] MRN: [id number] DOB: 1961/03/21  No chief complaint on file.   Taylor Grimes is a 62 y.o. year old female who presented for a telephone visit.   They were referred to the pharmacist by their PCP for assistance in managing diabetes and medication access.    Subjective:  Care Team: Primary Care Provider: Milinda Antis, Audrie Gallus, MD  Medication Access/Adherence  Patient reports affordability concerns with their medications: No  Patient reports access/transportation concerns to their pharmacy: No  Patient reports adherence concerns with their medications:  No    Last Diabetes-Related Visit: Pharm 02/17/23, PCP 03/25/23 7/30: Decrease glimepiride 4 > 2 mg daily; Add Jardiance 10 mg daily    Diabetes: Current medications:  metformin 1000 mg twice daily Trulicity 0.75 mg weekly glimepiride 2 mg daily  Jardiance 10 mg daily  SMBG: Glucometer FBG: 110 mg/dL (lowest 147) Evening BG: Typically 120-125 mg/dL   Hypoglycemic s/sx: No. Denies dizziness, shakiness, sweating.   Current meal patterns:  - Breakfast: egg and a piece of toast  - Lunch: peanut butter crackers; fruit; small more like a snack - Supper: salad, baked potato, grilled protein  Exercise: walks outside   Hypertension: Current medications: losartan 100 mg daily   Patient has a validated, automated, upper arm home BP cuff Current blood pressure readings readings: Not checking at home  Patient denies hypotensive s/sx including dizziness, lightheadedness.   Home scale: 185 lb (as of 04/22/23)   DM Prevention:  Statin: Taking; moderate intensity.?  History of chronic kidney disease? no History of albuminuria? no, last UACR on 03/25/23 = 1.7 mg/g ACE/ARB - Taking losartan 50 mg daily; Urine MA/CR Ratio - normal.  Last eye exam: 09/03/22; No retinopathy present Last foot exam: Regular foot exams from podiatry Tobacco Use: Former smoker (>10 yr ago) Immunizations:? Flu: Due, Pneumococcal:  PPSV23 (04/2007, 08/2019), Shingrix: Up to Date (last received 11/2020, 03/2022), Covid (09/2019, Moderna booster 06/2020)  Cardiovascular Risk Reduction History of clinical ASCVD? No; The 10-year ASCVD risk score (Arnett DK, et al., 2019) is: 13.9% History of heart failure? No History of hyperlipidemia? yes Current BMI: 35.5 kg/m2 (Ht 61 in, Wt 85.6 kg); Pt-reported weight on home scale = 185 lb (04/22/23) Taking statin? yes; moderate intensity (atorvastatin 20 mg daily) Taking aspirin? not indicated; Not taking   Taking SGLT-2i? Yes Taking GLP- 1 RA? yes    Objective:  Lab Results  Component Value Date   HGBA1C 7.8 (H) 03/18/2023    Lab Results  Component Value Date   CREATININE 0.87 03/18/2023   BUN 11 03/18/2023   NA 141 03/18/2023   K 3.8 03/18/2023   CL 106 03/18/2023   CO2 26 03/18/2023    Lab Results  Component Value Date   CHOL 175 03/18/2023   HDL 35.00 (L) 03/18/2023   LDLCALC 102 (H) 03/18/2023   LDLDIRECT 111.0 08/19/2019   TRIG 189.0 (H) 03/18/2023   CHOLHDL 5 03/18/2023    Medications Reviewed Today   Medications were not reviewed in this encounter     Assessment/Plan:  Diabetes: controlled - Last A1c slightly elevated at 7.8% (03/18/23) though significantly improved control per home readings which are all at goal - FBG 110s mg/dL (lowest 829), Evening BG 120-125 mg/dL. No concern for hypoglycemia on glimepiride. We discussed option to increase Trulicity (with the goal of stopping glimepiride and providing additional weight-lowering benefit). She previously had stomach upset on 1.5 mg and has remained apprehensive to increase. We discussed that  there is often less chance of adverse effects with a diet that is low in greasy/fatty foods or high-sugar foods. May consider increase as her diet continues to improve. She reports that she is interested in dose increase in January after her cruise.  Current Regimen: Trulicity 0.75 mg weekly, glimepiride 2 mg daily   Reviewed goal A1c, goal fasting, and goal 2 hour post prandial glucose Reviewed dietary modifications including: focus on lean proteins, fruits and vegetables, whole grains. Reducing fatty meats.  Continue to check glucose twice daily: fasting and 2 hour post prandial.  Future consideration: Titration of Trulicity per greater weight reduction propensity (pt preference to revisit in January) Discontinue glimepiride as able  Hypertension:controlled per clinic readings, last 126/84 mmHg (03/25/23). No c/f lightheadedness or adverse effects.  Reviewed long term cardiovascular and renal outcomes of uncontrolled blood pressure Reviewed appropriate blood pressure monitoring technique and reviewed goal blood pressure. Recommended to check home blood pressure and heart rate periodically, write down readings, and bring to future follow up Recommend to continue current regimen at this time   Hyperlipidemia/ASCVD Risk Reduction: uncontrolled per LDL 102 mg/dL (6/96/29) with goal <52 mg/dL in the setting of diabetes. On moderate-intensity statin, tolerating well.  Current regimen: Atorvastatin 20 mg daily  Risk factors include: DM2 (improving control), HLD (with elevated LDL/low HDL/persistently elevated TG), BMI >30 kg/m2, HTN (controlled). The 10-year ASCVD risk score (Arnett DK, et al., 2019) is: 13.9% Future consideration: Consider increasing atorvastatin to 40 mg daily per uncontrolled LDL with elevated 10 yr risk score. Repeat lipid panel 4-12 weeks s/p dose change   Follow Up Plan:  PCP as scheduled ~ 6 weeks PharmD ~ 4 weeks after PCP  Future Appointments  Date Time Provider Department Center  06/08/2023  8:30 AM Freddie Breech, DPM [facility]  06/24/2023  9:00 AM Carlus Pavlov, MD [facility] None  06/29/2023  8:00 AM Tower, Audrie Gallus, MD [facility]  07/27/2023  1:00 PM [facility] PHARMACIST [facility]    Loree Fee, PharmD Clinical Pharmacist [facility] [phone]

## 2023-04-27 NOTE — Patient Instructions (Signed)
[name],   It was a pleasure to see you today! As we discussed:?  Continue your medications as you have been taking them.  We discussed revisiting Trulicity dose increase after your cruise in January.  Please continue checking your blood sugars at home as you have been: In the morning before food, in the evening 2 hours after meal If you do begin to notice any signs of low blood sugar (dizzy, sweaty, shaky, extra hungry) please check your blood sugar and treat with 15 g of carbs if your sugar is less than 70 mg/dL.  Please send a MyChart message or call clinic if you are seeing any low sugars (less than 70 mg/dL) and we can discuss stopping the glimepiride.   Follow up with Dr. Milinda Antis is scheduled for 06/28/22 at 8:00 AM.    Follow up with clinical pharmacist via telephone around 1 month after your visit with Dr. Milinda Antis. 07/27/23 telephone visit 1:00 PM (flexible, please call if need to reschedule timing due to your cruise).    Berenice Primas, PharmD - Clinical Pharmacist

## 2023-05-13 DIAGNOSIS — H902 Conductive hearing loss, unspecified: Secondary | ICD-10-CM | POA: Diagnosis not present

## 2023-05-13 DIAGNOSIS — H6123 Impacted cerumen, bilateral: Secondary | ICD-10-CM | POA: Diagnosis not present

## 2023-05-20 ENCOUNTER — Other Ambulatory Visit: Payer: Self-pay | Admitting: Internal Medicine

## 2023-05-20 ENCOUNTER — Other Ambulatory Visit: Payer: Self-pay | Admitting: Family Medicine

## 2023-06-08 ENCOUNTER — Ambulatory Visit (INDEPENDENT_AMBULATORY_CARE_PROVIDER_SITE_OTHER): Payer: 59 | Admitting: Podiatry

## 2023-06-08 ENCOUNTER — Encounter: Payer: Self-pay | Admitting: Podiatry

## 2023-06-08 DIAGNOSIS — B351 Tinea unguium: Secondary | ICD-10-CM

## 2023-06-08 DIAGNOSIS — M79675 Pain in left toe(s): Secondary | ICD-10-CM

## 2023-06-08 DIAGNOSIS — E119 Type 2 diabetes mellitus without complications: Secondary | ICD-10-CM | POA: Diagnosis not present

## 2023-06-08 DIAGNOSIS — M79674 Pain in right toe(s): Secondary | ICD-10-CM

## 2023-06-08 NOTE — Progress Notes (Unsigned)
  Subjective:  Patient ID: Taylor Grimes, female    DOB: [birth year],  MRN: [id number]  62 y.o. female presents to clinic with  preventative diabetic foot care and painful elongated mycotic toenails 1-5 bilaterally which are tender when wearing enclosed shoe gear. Pain is relieved with periodic professional debridement. No chief complaint on file.  New problem(s): None   PCP is Tower, Audrie Gallus, MD. Last office visit was 03/25/2023.  No Known Allergies  Review of Systems: Negative except as noted in the HPI.   Objective:  Taylor Grimes is a pleasant 62 y.o. female in NAD. AAO x 3.  Vascular Examination: Vascular status intact b/l with palpable pedal pulses. CFT immediate b/l. No edema. No pain with calf compression b/l. Skin temperature gradient WNL b/l. No cyanosis or clubbing noted b/l LE.  Neurological Examination: Sensation grossly intact b/l with 10 gram monofilament. Vibratory sensation intact b/l.   Dermatological Examination: Pedal skin with normal turgor, texture and tone b/l. Toenails 1-5 b/l thick, discolored, elongated with subungual debris and pain on dorsal palpation. No hyperkeratotic lesions noted b/l.   Musculoskeletal Examination: Muscle strength 5/5 to b/l LE. No pain, crepitus or joint limitation noted with ROM bilateral LE. No gross bony deformities bilaterally.  Radiographs: None  Last A1c:      Latest Ref Rng & Units 03/18/2023    7:39 AM 12/22/2022    9:34 AM 09/15/2022    8:02 AM 06/23/2022    8:08 AM  Hemoglobin A1C  Hemoglobin-A1c 4.6 - 6.5 % 7.8  8.0  8.5  8.5      Assessment:  No diagnosis found.  Plan:  Patient was evaluated and treated. All patient's and/or POA's questions/concerns addressed on today's visit. Toenails 1-5 debrided in length and girth without incident. Continue soft, supportive shoe gear daily. Report any pedal injuries to medical professional. Call office if there are any questions/concerns. -Continue foot and shoe inspections  daily. Monitor blood glucose per PCP/Endocrinologist's recommendations. -Patient/POA to call should there be question/concern in the interim.  Return in about 3 months (around 09/08/2023).  Freddie Breech, DPM      [place] LOCATION: [address] [place] [address]                   Office [phone]   [facility] LOCATION: [address] [place], [place] [address] Office [phone]

## 2023-06-24 ENCOUNTER — Ambulatory Visit: Payer: 59 | Admitting: Family Medicine

## 2023-06-24 ENCOUNTER — Ambulatory Visit: Payer: 59 | Admitting: Internal Medicine

## 2023-06-29 ENCOUNTER — Encounter: Payer: Self-pay | Admitting: Family Medicine

## 2023-06-29 ENCOUNTER — Ambulatory Visit (INDEPENDENT_AMBULATORY_CARE_PROVIDER_SITE_OTHER): Payer: 59 | Admitting: Family Medicine

## 2023-06-29 VITALS — BP 122/70 | HR 93 | Temp 98.5°F | Ht 61.25 in | Wt 184.1 lb

## 2023-06-29 DIAGNOSIS — E1169 Type 2 diabetes mellitus with other specified complication: Secondary | ICD-10-CM

## 2023-06-29 DIAGNOSIS — I10 Essential (primary) hypertension: Secondary | ICD-10-CM | POA: Diagnosis not present

## 2023-06-29 DIAGNOSIS — E039 Hypothyroidism, unspecified: Secondary | ICD-10-CM

## 2023-06-29 DIAGNOSIS — E785 Hyperlipidemia, unspecified: Secondary | ICD-10-CM

## 2023-06-29 DIAGNOSIS — Z7985 Long-term (current) use of injectable non-insulin antidiabetic drugs: Secondary | ICD-10-CM

## 2023-06-29 DIAGNOSIS — E119 Type 2 diabetes mellitus without complications: Secondary | ICD-10-CM

## 2023-06-29 LAB — POCT GLYCOSYLATED HEMOGLOBIN (HGB A1C): Hemoglobin A1C: 6.9 % — AB (ref 4.0–5.6)

## 2023-06-29 MED ORDER — ATORVASTATIN CALCIUM 40 MG PO TABS: 40.0000 mg | ORAL_TABLET | Freq: Every day | ORAL | 3 refills | Status: DC

## 2023-06-29 NOTE — Assessment & Plan Note (Signed)
Taking trulicity 0.75 mg weekly  Would like to increase to 1.5 (try again) after cruise in [place] Will update [place] for that  Has helped dm2 and weight Lab Results  Component Value Date   HGBA1C 6.9 (A) 06/29/2023

## 2023-06-29 NOTE — Progress Notes (Signed)
Subjective:    Patient ID: Taylor Grimes, female    DOB: 1961-05-08, 62 y.o.   MRN: [id number]  HPI  Wt Readings from Last 3 Encounters:  06/29/23 184 lb 2 oz (83.5 kg)  03/25/23 188 lb 12.8 oz (85.6 kg)  01/15/23 190 lb 12.8 oz (86.5 kg)   34.51 kg/m  Vitals:   06/29/23 0755  BP: 122/70  Pulse: 93  Temp: 98.5 F (36.9 C)  SpO2: 99%   Pt presents for follow up of DM2 and chronic medical problems   Just got over a cold  Ears still bother her a bit   HTN bp is stable today  No cp or palpitations or headaches or edema  No side effects to medicines  BP Readings from Last 3 Encounters:  06/29/23 122/70  03/25/23 126/84  01/15/23 134/87    Losartan 50 mg daily    DM2 Lab Results  Component Value Date   HGBA1C 6.9 (A) 06/29/2023   Metformin 1000 mg bid  Trulicity 0.75 mg weekly  Amaryl 2 mg daily  Jardiance 10 mg daily   Interested in going up on trulicity after her cruise next month  A1c is improved to 6.9  Lab Results  Component Value Date   HGBA1C 6.9 (A) 06/29/2023   Blood sugars look good  Low 100s in am , around 130 in pm    Lab Results  Component Value Date   MICROALBUR <0.7 03/25/2023   MICROALBUR 2.6 (H) 03/17/2022   Before she got sick/ used elliptical for exercise    Eye exam 08/2022 - plans to schedule   Hyperlipidemia Lab Results  Component Value Date   CHOL 175 03/18/2023   HDL 35.00 (L) 03/18/2023   LDLCALC 102 (H) 03/18/2023   LDLDIRECT 111.0 08/19/2019   TRIG 189.0 (H) 03/18/2023   CHOLHDL 5 03/18/2023   Atorvastatin 20 mg daily   Sees endo Friday for thyroid    Patient Active Problem List   Diagnosis Date Noted   Long-term current use of injectable noninsulin antidiabetic medication 03/25/2023   Complex sclerosing lesion of right breast 01/15/2023   History of colonic polyps    Polyp of cecum    Yeast dermatitis 10/15/2021   Encounter for screening mammogram for breast cancer 10/04/2021   Hyperlipidemia  associated with type 2 diabetes mellitus (HCC) 08/17/2018   Hypertrophic toenail 02/02/2018   Anxiety disorder 09/12/2015   Low HDL (under 40) 10/24/2014   Routine general medical examination at a health care facility 03/08/2014   History of endometrial cancer 12/15/2012   Colon cancer screening 11/01/2012   DM type 2 (diabetes mellitus, type 2) (HCC) 06/30/2012   Varicose veins of leg with swelling, left 11/18/2011   Class 2 obesity due to excess calories with body mass index (BMI) of 35.0 to 35.9 in adult 11/12/2010   Essential hypertension 07/12/2009   Hypothyroidism (acquired) 05/16/2009   Past Medical History:  Diagnosis Date   Anxiety    Breast lesion    benign   Diabetes mellitus without complication (HCC)    Elevated transaminase level    ? fatty liver    History of endometrial cancer 12/15/2012   FIGO Grade 2 endometrioid adenocarcinoma with squamous differentiation involving the mucosa only.  From SGO Guidelines:  Recommended surveillance after treatment of endometrial cancer includes a follow-up visit every 3-6 months for 2 years, then every 6 months for 3 years, and annually thereafter. Each follow-up visit should include a thorough patient history;  elicitation and investigation of any new symptoms associated with recurrence, such as vaginal bleeding, pelvic pain, weight loss, or lethargy; and a thorough speculum, pelvic, and rectovaginal examination   HTN (hypertension)    Hypothyroid    UTI (urinary tract infection)    Past Surgical History:  Procedure Laterality Date   ABDOMINAL HYSTERECTOMY     BREAST BIOPSY     benign cyst 2011   BREAST BIOPSY Right 01/06/2023   [place] RT BREAST BX W LOC DEV 1ST LESION IMG BX SPEC US GUIDE 01/06/2023 [facility]   CATARACT EXTRACTION W/PHACO Right 01/07/2022   Procedure: CATARACT EXTRACTION PHACO AND INTRAOCULAR LENS PLACEMENT (IOC) RIGHT DIABETIC 8.04 01:01.7;  Surgeon: Galen Manila, MD;  Location: [facility];   Service: Ophthalmology;  Laterality: Right;  Diabetic   CATARACT EXTRACTION W/PHACO Left 10/07/2022   Procedure: CATARACT EXTRACTION PHACO AND INTRAOCULAR LENS PLACEMENT (IOC) LEFT DIABETIC 4.93 00:40.7;  Surgeon: Galen Manila, MD;  Location: [facility];  Service: Ophthalmology;  Laterality: Left;  Diabetic   CESAREAN SECTION     COLONOSCOPY WITH PROPOFOL N/A 11/20/2021   Procedure: COLONOSCOPY WITH PROPOFOL;  Surgeon: Toney Reil, MD;  Location: [facility];  Service: Gastroenterology;  Laterality: N/A;   INCISION AND DRAINAGE ABSCESS Right 08/16/2021   Procedure: INCISION AND DRAINAGE ABDOMINAL WALL ABSCESS;  Surgeon: Campbell Lerner, MD;  Location: [facility];  Service: General;  Laterality: Right;   LAPAROSCOPIC TOTAL HYSTERECTOMY  11/2012   Social History   Tobacco Use   Smoking status: Former    Current packs/day: 0.00    Types: Cigarettes    Quit date: 07/22/1979    Years since quitting: 43.9    Passive exposure: Never   Smokeless tobacco: Never   Tobacco comments:    briefly smoked at 16.  (May occasionally have 1 cig)  Vaping Use   Vaping status: Never Used  Substance Use Topics   Alcohol use: Yes    Comment: rarely   Drug use: No   Family History  Problem Relation Age of Onset   Hypothyroidism Mother    Alzheimer's disease Mother    Stroke Father    Deep vein thrombosis Sister    Colon cancer Neg Hx    Esophageal cancer Neg Hx    Stomach cancer Neg Hx    Rectal cancer Neg Hx    Breast cancer Neg Hx    No Known Allergies Current Outpatient Medications on File Prior to Visit  Medication Sig Dispense Refill   clotrimazole (LOTRIMIN) 1 % cream APPLY 1 APPLICATION. TOPICALLY 2 (TWO) TIMES DAILY. 30 g 0   Dulaglutide (TRULICITY) 0.75 MG/0.5ML SOPN Inject 0.75 mg into the skin once a week. 6 mL 2   empagliflozin (JARDIANCE) 10 MG TABS tablet TAKE 1 TABLET BY MOUTH DAILY BEFORE BREAKFAST. 90 tablet 0   glimepiride (AMARYL) 4 MG tablet Take 0.5 tablets  (2 mg total) by mouth daily before breakfast.     levothyroxine (SYNTHROID) 112 MCG tablet TAKE 1 TABLET BY MOUTH EVERY DAY 30 tablet 4   losartan (COZAAR) 100 MG tablet TAKE 1/2 TABLET BY MOUTH DAILY 45 tablet 0   metFORMIN (GLUCOPHAGE) 1000 MG tablet TAKE 1 TABLET (1,000 MG TOTAL) BY MOUTH TWICE A DAY WITH FOOD 180 tablet 1   No current facility-administered medications on file prior to visit.    Review of Systems  Constitutional:  Negative for activity change, appetite change, fatigue, fever and unexpected weight change.  HENT:  Negative for congestion, ear  pain, rhinorrhea, sinus pressure and sore throat.        Just got over a cold  Sniffling   Eyes:  Negative for pain, redness and visual disturbance.  Respiratory:  Positive for cough. Negative for shortness of breath and wheezing.   Cardiovascular:  Negative for chest pain and palpitations.  Gastrointestinal:  Negative for abdominal pain, blood in stool, constipation and diarrhea.  Endocrine: Negative for polydipsia and polyuria.  Genitourinary:  Negative for dysuria, frequency and urgency.  Musculoskeletal:  Negative for arthralgias, back pain and myalgias.  Skin:  Negative for pallor and rash.  Allergic/Immunologic: Negative for environmental allergies.  Neurological:  Negative for dizziness, syncope and headaches.  Hematological:  Negative for adenopathy. Does not bruise/bleed easily.  Psychiatric/Behavioral:  Negative for decreased concentration and dysphoric mood. The patient is not nervous/anxious.        Objective:   Physical Exam Constitutional:      General: She is not in acute distress.    Appearance: Normal appearance. She is well-developed. She is obese. She is not ill-appearing or diaphoretic.  HENT:     Head: Normocephalic and atraumatic.  Eyes:     Conjunctiva/sclera: Conjunctivae normal.     Pupils: Pupils are equal, round, and reactive to light.  Neck:     Thyroid: No thyromegaly.     Vascular: No  carotid bruit or JVD.  Cardiovascular:     Rate and Rhythm: Normal rate and regular rhythm.     Heart sounds: Normal heart sounds.     No gallop.  Pulmonary:     Effort: Pulmonary effort is normal. No respiratory distress.     Breath sounds: Normal breath sounds. No wheezing or rales.  Abdominal:     General: There is no distension or abdominal bruit.     Palpations: Abdomen is soft.  Musculoskeletal:     Cervical back: Normal range of motion and neck supple.     Right lower leg: No edema.     Left lower leg: No edema.  Lymphadenopathy:     Cervical: No cervical adenopathy.  Skin:    General: Skin is warm and dry.     Coloration: Skin is not pale.     Findings: No rash.  Neurological:     Mental Status: She is alert.     Coordination: Coordination normal.     Deep Tendon Reflexes: Reflexes are normal and symmetric. Reflexes normal.  Psychiatric:        Mood and Affect: Mood normal.           Assessment & Plan:   Problem List Items Addressed This Visit       Cardiovascular and Mediastinum   Essential hypertension    bp in fair control at this time  BP Readings from Last 1 Encounters:  06/29/23 122/70   No changes needed Most recent labs reviewed  Disc lifstyle change with low sodium diet and exercise  Plan to continue losartan 50 mg daily        Relevant Medications   atorvastatin (LIPITOR) 40 MG tablet     Endocrine   Hypothyroidism (acquired)    Sees endocrinology  Currently levothyroxine 112 mcg daily       Hyperlipidemia associated with type 2 diabetes mellitus (HCC)    Disc goals for lipids and reasons to control them Rev last labs with pt Rev low sat fat diet in detail  LDL of 102 Will go up go atorvastatin 40 from 20  Instructed  to call if side effects or problems   Follow up 3 mo/will do labs that day      Relevant Medications   atorvastatin (LIPITOR) 40 MG tablet   DM type 2 (diabetes mellitus, type 2) (HCC) - Primary     Improved Lab Results  Component Value Date   HGBA1C 6.9 (A) 06/29/2023  Metformin 1000 mg bid  Trulicity 0.75 mg weekly  Amaryl 2 mg daily  Jardiance 10 mg daily   Would like to try again going up on trulicity to 1.5 mg weekly- after her cruise next month (would hold amaryl when we do that)  Reviewed diet and home glucose readings Eating better Getting back to exercise after a cold   Follow up 3 months  Microalb utd and eye exam is planned         Relevant Medications   atorvastatin (LIPITOR) 40 MG tablet   Other Relevant Orders   POCT HgB A1C (Completed)     Other   Long-term current use of injectable noninsulin antidiabetic medication    Taking trulicity 0.75 mg weekly  Would like to increase to 1.5 (try again) after cruise in [place] Will update [place] for that  Has helped dm2 and weight Lab Results  Component Value Date   HGBA1C 6.9 (A) 06/29/2023

## 2023-06-29 NOTE — Assessment & Plan Note (Signed)
Sees endocrinology  Currently levothyroxine 112 mcg daily

## 2023-06-29 NOTE — Assessment & Plan Note (Signed)
Disc goals for lipids and reasons to control them Rev last labs with pt Rev low sat fat diet in detail  LDL of 102 Will go up go atorvastatin 40 from 20  Instructed to call if side effects or problems   Follow up 3 mo/will do labs that day

## 2023-06-29 NOTE — Assessment & Plan Note (Signed)
bp in fair control at this time  BP Readings from Last 1 Encounters:  06/29/23 122/70   No changes needed Most recent labs reviewed  Disc lifstyle change with low sodium diet and exercise  Plan to continue losartan 50 mg daily

## 2023-06-29 NOTE — Assessment & Plan Note (Signed)
Improved Lab Results  Component Value Date   HGBA1C 6.9 (A) 06/29/2023  Metformin 1000 mg bid  Trulicity 0.75 mg weekly  Amaryl 2 mg daily  Jardiance 10 mg daily   Would like to try again going up on trulicity to 1.5 mg weekly- after her cruise next month (would hold amaryl when we do that)  Reviewed diet and home glucose readings Eating better Getting back to exercise after a cold   Follow up 3 months  Microalb utd and eye exam is planned

## 2023-06-29 NOTE — Patient Instructions (Addendum)
When you get back from your trip- let us know, we can try going up on trulicity to 1.5 mg and then would glimiperide   I want to go up on the atorvastatin (lipitor) to 40 mg daily  If any problems or side effects let me know  We will re check cholesterol in 3 months   Keep working on diet and exercise   Take care of yourself

## 2023-07-03 ENCOUNTER — Ambulatory Visit (INDEPENDENT_AMBULATORY_CARE_PROVIDER_SITE_OTHER): Payer: 59 | Admitting: Internal Medicine

## 2023-07-03 ENCOUNTER — Encounter: Payer: Self-pay | Admitting: Internal Medicine

## 2023-07-03 VITALS — BP 122/80 | HR 94 | Ht 61.25 in | Wt 183.8 lb

## 2023-07-03 DIAGNOSIS — E039 Hypothyroidism, unspecified: Secondary | ICD-10-CM | POA: Diagnosis not present

## 2023-07-03 NOTE — Patient Instructions (Signed)
Please continue Levothyroxine 112 mcg daily.  Take the thyroid hormone every day, with water, at least 30 minutes before breakfast, separated by at least 4 hours from: - acid reflux medications - calcium - iron - multivitamins  Please stop at the lab.  Please come back for a follow-up appointment in 1 year.

## 2023-07-03 NOTE — Progress Notes (Unsigned)
Patient ID: DAHJA SPADEA, female   DOB: Nov 05, 1960, 62 y.o.   MRN: [id number]  HPI  Taylor Grimes is a 62 y.o.-year-old female, returning for f/u for hypothyroidism, dx 2012. Last visit 1 year ago. Her sister, Adalberto Ill, was also my pt. - she now moved to the [facility].  Interim hx.: She retired before last OV.  She recently broke up with her boyfriend.  They have been together for 10 years. She lost weight since last OV -another 17 lbs. She does not eat after 6 PM, also reduced starches.  She is on Trulicity 0.75 mg weekly, in an effort to be able to stop Glimepiride. She tried 1.5 mg Trulicity before >> increased nausea.  She has some hoarseness. She will go in a cruise next mo - to [place].  Pt is on levothyroxine 112  mcg daily, taken: - in am - fasting - at least 30 min from b'fast - + calcium in the evening, + occasional Tums - no iron - no multivitamins - no PPIs - not on Biotin  TFTs reviewed: Lab Results  Component Value Date   TSH 0.43 09/15/2022   TSH 0.60 06/26/2022   TSH 0.37 03/17/2022   TSH 0.28 (L) 12/05/2021   TSH 0.08 (L) 09/06/2021   TSH 0.39 06/27/2021   TSH 0.42 09/07/2020   TSH 0.52 06/21/2020   TSH 3.59 08/19/2019   TSH 1.07 03/10/2019   FREET4 1.34 06/26/2022   FREET4 1.59 03/17/2022   FREET4 1.57 12/05/2021   FREET4 1.58 06/27/2021   FREET4 1.25 06/21/2020   FREET4 1.45 03/10/2019   FREET4 1.01 03/08/2018   FREET4 1.15 02/24/2017   FREET4 1.44 01/14/2016   FREET4 1.60 09/28/2014   Pt denies: - feeling nodules in neck - dysphagia - choking In the past, she was waking up at 3 am to go to work and open [employer] at 4-7 am.  She limited her work days to 32 h per week. Her fatigue improved but improved even more significantly after retiring. She has hoarseness - after a URI last week.  No FH of thyroid cancer. However, all women in her family: hypothyroidism. No FH of thyroid cancer. No h/o radiation tx to head or neck. No herbal  supplements. No Biotin use. No recent steroids use.   ROS: + see HPI  I reviewed pt's medications, allergies, PMH, social hx, family hx, and changes were documented in the history of present illness. Otherwise, unchanged from my initial visit note.  Past Medical History:  Diagnosis Date   Anxiety    Breast lesion    benign   Diabetes mellitus without complication (HCC)    Elevated transaminase level    ? fatty liver    History of endometrial cancer 12/15/2012   FIGO Grade 2 endometrioid adenocarcinoma with squamous differentiation involving the mucosa only.  From SGO Guidelines:  Recommended surveillance after treatment of endometrial cancer includes a follow-up visit every 3-6 months for 2 years, then every 6 months for 3 years, and annually thereafter. Each follow-up visit should include a thorough patient history; elicitation and investigation of any new symptoms associated with recurrence, such as vaginal bleeding, pelvic pain, weight loss, or lethargy; and a thorough speculum, pelvic, and rectovaginal examination   HTN (hypertension)    Hypothyroid    UTI (urinary tract infection)    Past Surgical History:  Procedure Laterality Date   ABDOMINAL HYSTERECTOMY     BREAST BIOPSY     benign cyst  2011   BREAST BIOPSY Right 01/06/2023   [place] RT BREAST BX W LOC DEV 1ST LESION IMG BX SPEC US GUIDE 01/06/2023 [facility]   CATARACT EXTRACTION W/PHACO Right 01/07/2022   Procedure: CATARACT EXTRACTION PHACO AND INTRAOCULAR LENS PLACEMENT (IOC) RIGHT DIABETIC 8.04 01:01.7;  Surgeon: Galen Manila, MD;  Location: [facility];  Service: Ophthalmology;  Laterality: Right;  Diabetic   CATARACT EXTRACTION W/PHACO Left 10/07/2022   Procedure: CATARACT EXTRACTION PHACO AND INTRAOCULAR LENS PLACEMENT (IOC) LEFT DIABETIC 4.93 00:40.7;  Surgeon: Galen Manila, MD;  Location: [facility];  Service: Ophthalmology;  Laterality: Left;  Diabetic   CESAREAN SECTION     COLONOSCOPY  WITH PROPOFOL N/A 11/20/2021   Procedure: COLONOSCOPY WITH PROPOFOL;  Surgeon: Toney Reil, MD;  Location: [facility];  Service: Gastroenterology;  Laterality: N/A;   INCISION AND DRAINAGE ABSCESS Right 08/16/2021   Procedure: INCISION AND DRAINAGE ABDOMINAL WALL ABSCESS;  Surgeon: Campbell Lerner, MD;  Location: [facility];  Service: General;  Laterality: Right;   LAPAROSCOPIC TOTAL HYSTERECTOMY  11/2012   Social History   Socioeconomic History   Marital status: Legally Separated    Spouse name: Not on file   Number of children: 2   Years of education: Not on file   Highest education level: 12th grade  Occupational History   Occupation: shift Clinical cytogeneticist: [employer]  Tobacco Use   Smoking status: Former    Current packs/day: 0.00    Types: Cigarettes    Quit date: 07/22/1979    Years since quitting: 43.9    Passive exposure: Never   Smokeless tobacco: Never   Tobacco comments:    briefly smoked at 16.  (May occasionally have 1 cig)  Vaping Use   Vaping status: Never Used  Substance and Sexual Activity   Alcohol use: Yes    Comment: rarely   Drug use: No   Sexual activity: Yes    Birth control/protection: Post-menopausal  Other Topics Concern   Not on file  Social History Narrative   Not on file   Social Drivers of Health   Financial Resource Strain: Low Risk  (06/25/2023)   Overall Financial Resource Strain (CARDIA)    Difficulty of Paying Living Expenses: Not hard at all  Food Insecurity: No Food Insecurity (06/25/2023)   Hunger Vital Sign    Worried About Running Out of Food in the Last Year: Never true    Ran Out of Food in the Last Year: Never true  Transportation Needs: No Transportation Needs (06/25/2023)   PRAPARE - Administrator, Civil Service (Medical): No    Lack of Transportation (Non-Medical): No  Physical Activity: Insufficiently Active (06/25/2023)   Exercise Vital Sign    Days of Exercise per Week: 3 days     Minutes of Exercise per Session: 20 min  Stress: No Stress Concern Present (06/25/2023)   [employer] of Occupational Health - Occupational Stress Questionnaire    Feeling of Stress : Not at all  Social Connections: Socially Isolated (06/25/2023)   Social Connection and Isolation Panel [NHANES]    Frequency of Communication with Friends and Family: More than three times a week    Frequency of Social Gatherings with Friends and Family: Twice a week    Attends Religious Services: Never    Database administrator or Organizations: No    Attends Engineer, structural: Not on file    Marital Status: Divorced  Intimate Partner Violence: Not  on file   Current Outpatient Medications on File Prior to Visit  Medication Sig Dispense Refill   atorvastatin (LIPITOR) 40 MG tablet Take 1 tablet (40 mg total) by mouth daily. 90 tablet 3   clotrimazole (LOTRIMIN) 1 % cream APPLY 1 APPLICATION. TOPICALLY 2 (TWO) TIMES DAILY. 30 g 0   Dulaglutide (TRULICITY) 0.75 MG/0.5ML SOPN Inject 0.75 mg into the skin once a week. 6 mL 2   empagliflozin (JARDIANCE) 10 MG TABS tablet TAKE 1 TABLET BY MOUTH DAILY BEFORE BREAKFAST. 90 tablet 0   glimepiride (AMARYL) 4 MG tablet Take 0.5 tablets (2 mg total) by mouth daily before breakfast.     levothyroxine (SYNTHROID) 112 MCG tablet TAKE 1 TABLET BY MOUTH EVERY DAY 30 tablet 4   losartan (COZAAR) 100 MG tablet TAKE 1/2 TABLET BY MOUTH DAILY 45 tablet 0   metFORMIN (GLUCOPHAGE) 1000 MG tablet TAKE 1 TABLET (1,000 MG TOTAL) BY MOUTH TWICE A DAY WITH FOOD 180 tablet 1   No current facility-administered medications on file prior to visit.   No Known Allergies Family History  Problem Relation Age of Onset   Hypothyroidism Mother    Alzheimer's disease Mother    Stroke Father    Deep vein thrombosis Sister    Colon cancer Neg Hx    Esophageal cancer Neg Hx    Stomach cancer Neg Hx    Rectal cancer Neg Hx    Breast cancer Neg Hx    PE: BP 122/80   Pulse  94   Ht 5' 1.25" (1.556 m)   Wt 183 lb 12.8 oz (83.4 kg)   LMP 07/21/2008   SpO2 97%   BMI 34.45 kg/m  Wt Readings from Last 10 Encounters:  07/03/23 183 lb 12.8 oz (83.4 kg)  06/29/23 184 lb 2 oz (83.5 kg)  03/25/23 188 lb 12.8 oz (85.6 kg)  01/15/23 190 lb 12.8 oz (86.5 kg)  12/22/22 193 lb (87.5 kg)  11/04/22 190 lb (86.2 kg)  10/07/22 196 lb (88.9 kg)  09/22/22 196 lb 6 oz (89.1 kg)  06/26/22 201 lb 9.6 oz (91.4 kg)  06/23/22 201 lb (91.2 kg)   Constitutional: overweight, in NAD Eyes:  EOMI, no exophthalmos ENT: no neck masses, no cervical lymphadenopathy Cardiovascular: RRR, No MRG Respiratory: CTA B Musculoskeletal: no deformities Skin:no rashes Neurological: + Very mild tremor with outstretched hands  ASSESSMENT: 1. Hypothyroidism  PLAN:  1. Patient with acquired hypothyroidism, on levothyroxine - latest thyroid labs reviewed with pt. >> normal: Lab Results  Component Value Date   TSH 0.43 09/15/2022  - she continues on LT4 112 mcg daily - pt feels good on this dose.  Before last visit, she lost 15 pounds.  She lost another 18 pounds since then.  We discussed that with continuous weight loss, we may need to reduce the dose of levothyroxine. - we discussed about taking the thyroid hormone every day, with water, >30 minutes before breakfast, separated by >4 hours from acid reflux medications, calcium, iron, multivitamins. Pt. is taking it correctly. - will check thyroid tests today: TSH and fT4 - If labs are abnormal, she will need to return for repeat TFTs in 1.5 months -I will see her back in 1 year, but possibly sooner for labs  Needs refills.  Carlus Pavlov, MD PhD [facility] Endocrinology

## 2023-07-04 LAB — T4, FREE: Free T4: 1.9 ng/dL — ABNORMAL HIGH (ref 0.8–1.8)

## 2023-07-04 LAB — TSH: TSH: 0.13 m[IU]/L — ABNORMAL LOW (ref 0.40–4.50)

## 2023-07-06 ENCOUNTER — Encounter: Payer: Self-pay | Admitting: Internal Medicine

## 2023-07-06 DIAGNOSIS — E039 Hypothyroidism, unspecified: Secondary | ICD-10-CM

## 2023-07-06 MED ORDER — LEVOTHYROXINE SODIUM 100 MCG PO TABS: 100.0000 ug | ORAL_TABLET | Freq: Every day | ORAL | 3 refills | Status: DC

## 2023-07-08 NOTE — Addendum Note (Signed)
Addended by: Pollie Meyer on: 07/08/2023 04:18 PM   Modules accepted: Orders

## 2023-07-13 ENCOUNTER — Other Ambulatory Visit: Payer: Self-pay

## 2023-07-13 DIAGNOSIS — N6489 Other specified disorders of breast: Secondary | ICD-10-CM

## 2023-07-16 DIAGNOSIS — M3501 Sicca syndrome with keratoconjunctivitis: Secondary | ICD-10-CM | POA: Diagnosis not present

## 2023-07-16 DIAGNOSIS — E119 Type 2 diabetes mellitus without complications: Secondary | ICD-10-CM | POA: Diagnosis not present

## 2023-07-16 DIAGNOSIS — Z961 Presence of intraocular lens: Secondary | ICD-10-CM | POA: Diagnosis not present

## 2023-07-16 LAB — HM DIABETES EYE EXAM

## 2023-07-23 ENCOUNTER — Encounter: Payer: Self-pay | Admitting: Pharmacist

## 2023-07-27 ENCOUNTER — Other Ambulatory Visit: Payer: 59

## 2023-08-06 ENCOUNTER — Ambulatory Visit
Admission: RE | Admit: 2023-08-06 | Discharge: 2023-08-06 | Disposition: A | Discharge status: Home / Self Care | Payer: Medicaid Other | Source: Ambulatory Visit | Attending: Surgery | Admitting: Surgery

## 2023-08-06 ENCOUNTER — Other Ambulatory Visit: Payer: Self-pay | Admitting: Surgery

## 2023-08-06 DIAGNOSIS — N6489 Other specified disorders of breast: Secondary | ICD-10-CM | POA: Diagnosis present

## 2023-08-06 DIAGNOSIS — R928 Other abnormal and inconclusive findings on diagnostic imaging of breast: Secondary | ICD-10-CM

## 2023-08-07 ENCOUNTER — Other Ambulatory Visit: Payer: 59

## 2023-08-12 ENCOUNTER — Other Ambulatory Visit: Payer: Medicaid Other

## 2023-08-12 ENCOUNTER — Encounter: Payer: Self-pay | Admitting: Pharmacist

## 2023-08-12 DIAGNOSIS — E119 Type 2 diabetes mellitus without complications: Secondary | ICD-10-CM

## 2023-08-12 MED ORDER — TRULICITY 1.5 MG/0.5ML ~~LOC~~ SOAJ: 1.5000 mg | SUBCUTANEOUS | 2 refills | Status: DC

## 2023-08-12 NOTE — Patient Instructions (Addendum)
[name],   It was a pleasure to see you today! As we discussed:?  Increase Trulicity to 1.5 mg weekly It looks like your new insurance does not require a prior authorization for Trulicity.  However, please let us know if the copay under your new insurance is high.  Upon increasing Trulicity, you may stop taking glimepiride Continue your other medications as you have been taking them.   While on a GLP1 medication like Trulicity (and with dose increases) Eating smaller meals, avoiding high-fat and high-sugar foods, and remaining upright after eating may reduce nausea.  Overeating is a major trigger of nausea, as often times patients will start to feel full sooner and may need to decrease portion sizes from what they were previously accustomed to.  Note, that nausea typically resolves over time and is often a temporary side effect as your body adjusts.   ________________________________________________________________________________ I always recommend speaking to a Medicare Expert (Called a Data processing manager) before finalizing a Medicare Plan. This is a free resource available to all people.    [employer]  Presenter, broadcasting Information Program [facility]) Coordinator Toma Deiters [facility] [address]     [place]  [phone] (Ask for SHIIP help)  - I would make sure you have a list of your medications when looking at plans so the best plan for your needs is selected.    Future Appointments  Date Time Provider Department Center  08/18/2023 10:45 AM Campbell Lerner, MD [facility] None  09/07/2023  9:00 AM Freddie Breech, DPM [facility]  09/28/2023 10:00 AM Tower, Audrie Gallus, MD [facility]  07/04/2024 10:00 AM Carlus Pavlov, MD [facility] None   Berenice Primas, PharmD - Clinical Pharmacist

## 2023-08-12 NOTE — Progress Notes (Signed)
08/12/2023 Name: Taylor Grimes MRN: [id number] DOB: February 27, 1961  Chief Complaint  Patient presents with   Diabetes    Taylor Grimes is a 63 y.o. year old female who presented for a telephone visit.   They were referred to the pharmacist by their PCP for assistance in managing diabetes and medication access.   Subjective:  Care Team: Primary Care Provider: Milinda Antis, Audrie Gallus, MD  Medication Access/Adherence  Patient reports affordability concerns with their medications: No  Patient reports access/transportation concerns to their pharmacy: No  Patient reports adherence concerns with their medications:  No    Last Diabetes-Related Visit: Pharmacy 04/22/23, PCP 06/29/23 10/2: Darcus Austin to increase Trulicity until after cruise in January d/t prev c/f side effects.  7/30: Decrease glimepiride 4 > 2 mg daily; Add Jardiance 10 mg daily   Diabetes: Current medications:  metformin 1000 mg twice daily Trulicity 0.75 mg weekly glimepiride 2 mg daily (morning) Jardiance 10 mg daily  SMBG: Glucometer FBG: 100-110 mg/dL  Hypoglycemic s/sx: No. Denies dizziness, shakiness, sweating.   Current meal patterns:  - Breakfast: egg and a piece of toast  - Lunch: peanut butter crackers; fruit; small more like a snack - Supper: salad, baked potato, grilled protein  Exercise: walks outside. Limited with recent cold weather  Hypertension: Current medications: losartan 100 mg daily   Patient has a validated, automated, upper arm home BP cuff Current blood pressure readings readings: Not checking at home  Patient denies hypotensive s/sx including dizziness, lightheadedness.  Burning with urination.    DM Prevention:  Statin: Taking; moderate intensity.?  History of chronic kidney disease? no History of albuminuria? no, last UACR on 03/25/23 = 1.7 mg/g ACE/ARB - Taking losartan 50 mg daily; Urine MA/CR Ratio - normal.  Last eye exam: 09/03/22; No retinopathy present Last foot exam: Regular  foot exams from podiatry Tobacco Use: Former smoker (>10 yr ago) Immunizations:? Flu: Due, Pneumococcal: PPSV23 (04/2007, 08/2019), Shingrix: Up to Date (last received 11/2020, 03/2022), Covid (09/2019, Moderna booster 06/2020)  Cardiovascular Risk Reduction History of clinical ASCVD? No; The 10-year ASCVD risk score (Arnett DK, et al., 2019) is: 13.1% History of heart failure? No History of hyperlipidemia? yes Current BMI: 35.5 kg/m2 (Ht 61 in, Wt 85.6 kg); Pt-reported weight on home scale = 185 lb (04/22/23) Taking statin? yes; moderate intensity (atorvastatin 20 mg daily) Taking aspirin? not indicated; Not taking   Taking SGLT-2i? Yes Taking GLP- 1 RA? yes    Objective: Lab Results  Component Value Date   HGBA1C 6.9 (A) 06/29/2023   HGBA1C 7.8 (H) 03/18/2023   HGBA1C 8.0 (A) 12/22/2022   HGBA1C 8.5 (H) 09/15/2022   HGBA1C 8.5 (A) 06/23/2022   HGBA1C 9.2 (H) 03/17/2022   Lab Results  Component Value Date   CREATININE 0.87 03/18/2023   BUN 11 03/18/2023   NA 141 03/18/2023   K 3.8 03/18/2023   CL 106 03/18/2023   CO2 26 03/18/2023   Lab Results  Component Value Date   CHOL 175 03/18/2023   HDL 35.00 (L) 03/18/2023   LDLCALC 102 (H) 03/18/2023   LDLDIRECT 111.0 08/19/2019   TRIG 189.0 (H) 03/18/2023   CHOLHDL 5 03/18/2023   Medications Reviewed Today   Medications were not reviewed in this encounter    Assessment & Plan   Diabetes: controlled - Last A1c controlled at 6.9% (12/9) improved from previous 7.8%. Today, FBG remain 100s-110s mg/dL per patient report. Now that cruise has passed, we discussed option to increase Trulicity (with  the goal of stopping glimepiride and providing additional weight-lowering benefit). Current Regimen: Trulicity 0.75 mg weekly, glimepiride 2 mg daily, Jardiance 10 mg daily, metformin 1000 mg BID Increase Trulicity to 1.5 mg weekly + stop glimepiride PA initiated for Medicaid per CPhT team Reviewed that while on a GLP1RA, eating smaller  meals, avoiding high-fat foods, and remaining upright after eating may reduce nausea. Also discussed that overeating is a major trigger of nausea with GLP1RAs, as often times patients will start to feel full sooner and may need to decrease portion sizes from what they were previously accustomed to.  Reviewed dietary modifications including: focus on lean proteins, fruits and vegetables, whole grains. Reducing fatty meats.  Continue to check glucose once-twice daily: fasting and 2 hour post prandial.  Future consideration: Titration of Trulicity as tolerated. Could consider alternative GLP1 though possibility of nausea is a class effect (could microdose Ozempic for smaller titrations)   Hypertension:controlled per clinic readings, last 122/70 mmHg (06/29/23). No c/f lightheadedness or adverse effects.  Reviewed long term cardiovascular and renal outcomes of uncontrolled blood pressure Reviewed appropriate blood pressure monitoring technique and reviewed goal blood pressure. Recommended to check home blood pressure and heart rate periodically, write down readings, and bring to future follow up Recommend to continue current regimen at this time   Hyperlipidemia/ASCVD Risk Reduction: uncontrolled per LDL 102 mg/dL (1/61/09) with goal <60 mg/dL in the setting of diabetes. On moderate-intensity statin, tolerating well.  Current regimen: Atorvastatin 20 mg daily  Risk factors include: DM2 (improving control), HLD (with elevated LDL/low HDL/persistently elevated TG), BMI >30 kg/m2, HTN (controlled). The 10-year ASCVD risk score (Arnett DK, et al., 2019) is: 13.1% Future consideration: Repeat lipid panel 4-12 weeks s/p dose change (statin last increased 12/9)  Follow Up Plan:  2-4 weeks to review Trulicity tolerability  Future Appointments  Date Time Provider Department Center  08/18/2023 10:45 AM Campbell Lerner, MD [facility] None  09/07/2023  9:00 AM Freddie Breech, DPM [facility]   09/28/2023 10:00 AM Tower, Audrie Gallus, MD [facility]  07/04/2024 10:00 AM Carlus Pavlov, MD [facility] None    Loree Fee, PharmD Clinical Pharmacist [facility] [phone]

## 2023-08-13 ENCOUNTER — Ambulatory Visit: Payer: 59 | Admitting: Surgery

## 2023-08-13 ENCOUNTER — Other Ambulatory Visit (HOSPITAL_COMMUNITY): Payer: Self-pay

## 2023-08-13 ENCOUNTER — Telehealth: Payer: Self-pay

## 2023-08-13 NOTE — Telephone Encounter (Signed)
-----   Message from Loree Fee sent at 08/13/2023  9:55 AM EST ----- Primary insurance does not require prior auth, though per retail pharmacy her Medicaid does.   Can we submit through Medicaid for Trulicity 1.5 mg weekly, please? Thank you!

## 2023-08-13 NOTE — Telephone Encounter (Signed)
Pharmacy Patient Advocate Encounter   Received notification from Physician's Office that prior authorization for TRULICITY 1.5MG /0.5ML is required/requested.   Insurance verification completed.   The patient is insured through [facility] .   Per test claim: PA required; PA submitted to above mentioned insurance via CoverMyMeds Key/confirmation #/EOC [id number] Status is pending

## 2023-08-15 ENCOUNTER — Other Ambulatory Visit: Payer: Self-pay | Admitting: Family Medicine

## 2023-08-17 ENCOUNTER — Other Ambulatory Visit (HOSPITAL_COMMUNITY): Payer: Self-pay

## 2023-08-17 NOTE — Telephone Encounter (Signed)
Pharmacy Patient Advocate Encounter  Received notification from [facility] that Prior Authorization for Trulicity 1.5MG /0.5ML auto-injectors has been APPROVED from 08/17/23 to 08/16/24. Unable to obtain price due to refill too soon rejection, last fill date 08/17/23 next available fill date2/18/25   PA #/Case ID/Reference #: [id number]

## 2023-08-17 NOTE — Progress Notes (Unsigned)
Patient ID: Taylor Grimes, female   DOB: 11/29/1960, 63 y.o.   MRN: [id number]  Chief Complaint: Complex sclerosing lesion right breast  History of Present Illness Taylor Grimes is a 63 y.o. female with an abnormality which was identified on screening imaging.  She underwent ultrasound-guided spring-loaded core biopsy.  Biopsy pathology noted below.  She denies any family history of breast cancer.  Past Medical History Past Medical History:  Diagnosis Date   Anxiety    Breast lesion    benign   Diabetes mellitus without complication (HCC)    Elevated transaminase level    ? fatty liver    History of endometrial cancer 12/15/2012   FIGO Grade 2 endometrioid adenocarcinoma with squamous differentiation involving the mucosa only.  From SGO Guidelines:  Recommended surveillance after treatment of endometrial cancer includes a follow-up visit every 3-6 months for 2 years, then every 6 months for 3 years, and annually thereafter. Each follow-up visit should include a thorough patient history; elicitation and investigation of any new symptoms associated with recurrence, such as vaginal bleeding, pelvic pain, weight loss, or lethargy; and a thorough speculum, pelvic, and rectovaginal examination   HTN (hypertension)    Hypothyroid    UTI (urinary tract infection)       Past Surgical History:  Procedure Laterality Date   ABDOMINAL HYSTERECTOMY     BREAST BIOPSY     benign cyst 2011   BREAST BIOPSY Right 01/06/2023   [place] RT BREAST BX W LOC DEV 1ST LESION IMG BX SPEC US GUIDE 01/06/2023 [facility]   CATARACT EXTRACTION W/PHACO Right 01/07/2022   Procedure: CATARACT EXTRACTION PHACO AND INTRAOCULAR LENS PLACEMENT (IOC) RIGHT DIABETIC 8.04 01:01.7;  Surgeon: Galen Manila, MD;  Location: [facility];  Service: Ophthalmology;  Laterality: Right;  Diabetic   CATARACT EXTRACTION W/PHACO Left 10/07/2022   Procedure: CATARACT EXTRACTION PHACO AND INTRAOCULAR LENS PLACEMENT (IOC) LEFT  DIABETIC 4.93 00:40.7;  Surgeon: Galen Manila, MD;  Location: [facility];  Service: Ophthalmology;  Laterality: Left;  Diabetic   CESAREAN SECTION     COLONOSCOPY WITH PROPOFOL N/A 11/20/2021   Procedure: COLONOSCOPY WITH PROPOFOL;  Surgeon: Toney Reil, MD;  Location: [facility];  Service: Gastroenterology;  Laterality: N/A;   INCISION AND DRAINAGE ABSCESS Right 08/16/2021   Procedure: INCISION AND DRAINAGE ABDOMINAL WALL ABSCESS;  Surgeon: Campbell Lerner, MD;  Location: [facility];  Service: General;  Laterality: Right;   LAPAROSCOPIC TOTAL HYSTERECTOMY  11/2012    No Known Allergies  Current Outpatient Medications  Medication Sig Dispense Refill   atorvastatin (LIPITOR) 40 MG tablet Take 1 tablet (40 mg total) by mouth daily. 90 tablet 3   clotrimazole (LOTRIMIN) 1 % cream APPLY 1 APPLICATION. TOPICALLY 2 (TWO) TIMES DAILY. 30 g 0   Dulaglutide (TRULICITY) 0.75 MG/0.5ML SOPN Inject 0.75 mg into the skin once a week. 6 mL 2   Dulaglutide (TRULICITY) 1.5 MG/0.5ML SOAJ Inject 1.5 mg into the skin once a week. 2 mL 2   glimepiride (AMARYL) 4 MG tablet Take 0.5 tablets (2 mg total) by mouth daily before breakfast.     JARDIANCE 10 MG TABS tablet TAKE 1 TABLET BY MOUTH EVERY DAY BEFORE BREAKFAST 30 tablet 2   levothyroxine (SYNTHROID) 100 MCG tablet Take 1 tablet (100 mcg total) by mouth daily. 45 tablet 3   losartan (COZAAR) 100 MG tablet TAKE 1/2 TABLET BY MOUTH DAILY 15 tablet 2   metFORMIN (GLUCOPHAGE) 1000 MG tablet TAKE 1 TABLET (1,000 MG TOTAL)  BY MOUTH TWICE A DAY WITH FOOD 180 tablet 1   No current facility-administered medications for this visit.    Family History Family History  Problem Relation Age of Onset   Hypothyroidism Mother    Alzheimer's disease Mother    Stroke Father    Deep vein thrombosis Sister    Colon cancer Neg Hx    Esophageal cancer Neg Hx    Stomach cancer Neg Hx    Rectal cancer Neg Hx    Breast cancer Neg Hx       Social  History Social History   Tobacco Use   Smoking status: Former    Current packs/day: 0.00    Types: Cigarettes    Quit date: 07/22/1979    Years since quitting: 44.1    Passive exposure: Never   Smokeless tobacco: Never   Tobacco comments:    briefly smoked at 16.  (May occasionally have 1 cig)  Vaping Use   Vaping status: Never Used  Substance Use Topics   Alcohol use: Yes    Comment: rarely   Drug use: No        Review of Systems  All other systems reviewed and are negative.    Physical Exam Last menstrual period 07/21/2008.    CONSTITUTIONAL: Well developed, and nourished, appropriately responsive and aware without distress.   EYES: Sclera non-icteric.   EARS, NOSE, MOUTH AND THROAT:  The oropharynx is clear. Oral mucosa is pink and moist.     Hearing is intact to voice.  NECK: Trachea is midline, and there is no jugular venous distension.  LYMPH NODES:  Lymph nodes in the neck are not appreciated. RESPIRATORY:  Normal respiratory effort without pathologic use of accessory muscles. CARDIOVASCULAR:  Well perfused.  GI: The abdomen is  soft, nontender, and nondistended. There were no palpable masses.  GU: Marylene Land present as chaperone.  Steri-Strips still present on the right breast.  There is no appreciable palpable abnormality in either breast.  No dominant nodularity, nor suspicious skin changes present. MUSCULOSKELETAL:  Symmetrical muscle tone appreciated in all four extremities.    SKIN: Skin turgor is normal. No pathologic skin lesions appreciated.  NEUROLOGIC:  Motor and sensation appear grossly normal.  Cranial nerves are grossly without defect. PSYCH:  Alert and oriented to person, place and time. Affect is appropriate for situation.  Data Reviewed I have personally reviewed what is currently available of the patient's imaging, recent labs and medical records.   Labs:     Latest Ref Rng & Units 09/15/2022    8:02 AM 09/06/2021    7:34 AM 08/16/2021   10:09 AM   CBC  WBC 4.0 - 10.5 K/uL 7.9  4.5  15.4   Hemoglobin 12.0 - 15.0 g/dL 16.1  09.6  04.5   Hematocrit 36.0 - 46.0 % 39.7  37.3  39.6   Platelets 150.0 - 400.0 K/uL 151.0  159.0  196       Latest Ref Rng & Units 03/18/2023    7:39 AM 09/15/2022    8:02 AM 03/17/2022    7:28 AM  CMP  Glucose 70 - 99 mg/dL 409  811  914   BUN 6 - 23 mg/dL 11  13  12    Creatinine 0.40 - 1.20 mg/dL 7.82  9.56  2.13   Sodium 135 - 145 mEq/L 141  142  138   Potassium 3.5 - 5.1 mEq/L 3.8  4.2  4.1   Chloride 96 - 112 mEq/L 106  104  103   CO2 19 - 32 mEq/L 26  28  27    Calcium 8.4 - 10.5 mg/dL 9.7  9.6  16.1   Total Protein 6.0 - 8.3 g/dL 7.0  6.7    Total Bilirubin 0.2 - 1.2 mg/dL 1.0  1.1    Alkaline Phos 39 - 117 U/L 89  104    AST 0 - 37 U/L 34  30    ALT 0 - 35 U/L 26  23        Imaging: Radiological images reviewed:    CLINICAL DATA:  63 year old female for six-month follow-up of RIGHT breast complex sclerosing lesion biopsied on 01/06/2023. Imaging follow-up instead of surgery is being pursued.   EXAM: DIGITAL DIAGNOSTIC UNILATERAL RIGHT MAMMOGRAM WITH TOMOSYNTHESIS AND CAD; ULTRASOUND RIGHT BREAST LIMITED   TECHNIQUE: Right digital diagnostic mammography and breast tomosynthesis was performed. The images were evaluated with computer-aided detection. ; Targeted ultrasound examination of the right breast was performed   COMPARISON:  Previous exam(s).   ACR Breast Density Category b: There are scattered areas of fibroglandular density.   FINDINGS: Full field views of the RIGHT breast demonstrate a RIBBON biopsy clip within the anterior RIGHT breast without new or suspicious mammographic changes.   Targeted ultrasound is performed, showing a 0.2 cm intraductal mass/hypoechoic area with adjacent biopsy clip at the 12 o'clock position of the RIGHT breast 3 cm from the nipple. No new or enlarging sonographic abnormalities are noted.   IMPRESSION: 1. Stable changes in the area of  RIGHT breast biopsy-proven complex sclerosing lesion. No new or suspicious abnormalities identified.   RECOMMENDATION: Bilateral diagnostic mammogram and RIGHT breast ultrasound in 6 months.   I have discussed the findings and recommendations with the patient. If applicable, a reminder letter will be sent to the patient regarding the next appointment.   BI-RADS CATEGORY  2: Benign.     Electronically Signed   By: Harmon Pier M.D.   On: 08/06/2023 10:28  Assessment     Patient Active Problem List   Diagnosis Date Noted   Long-term current use of injectable noninsulin antidiabetic medication 03/25/2023   Complex sclerosing lesion of right breast 01/15/2023   History of colonic polyps    Polyp of cecum    Yeast dermatitis 10/15/2021   Encounter for screening mammogram for breast cancer 10/04/2021   Hyperlipidemia associated with type 2 diabetes mellitus (HCC) 08/17/2018   Hypertrophic toenail 02/02/2018   Anxiety disorder 09/12/2015   Low HDL (under 40) 10/24/2014   Routine general medical examination at a health care facility 03/08/2014   History of endometrial cancer 12/15/2012   Colon cancer screening 11/01/2012   DM type 2 (diabetes mellitus, type 2) (HCC) 06/30/2012   Varicose veins of leg with swelling, left 11/18/2011   Class 2 obesity due to excess calories with body mass index (BMI) of 35.0 to 35.9 in adult 11/12/2010   Essential hypertension 07/12/2009   Hypothyroidism (acquired) 05/16/2009    Plan    Options of proceeding with excision, utilizing a tag to localize this area discussed in detail.  Other options of continued observation were discussed with the associated risks reviewing the data below. She is elected that she would like to continue screening and observation, but willing to undergo biopsy should there be a change. Therefore we anticipate having her follow-up in 6 months with diagnostic mammography of the right breast.   Radial scars -- Complex  Sclerosing Lesion (CSL) Radial scars, also called complex  sclerosing lesions, are a pathologic diagnosis, usually discovered incidentally when a breast mass or radiologic abnormality is removed or biopsied. Occasionally, radial scars are large enough to be detected on mammography as suspicious spiculated masses, which cannot be reliably differentiated from spiculated carcinoma by imaging alone. Radial scars are characterized microscopically by a fibroelastic core with radiating ducts and lobules. In general, surgical excision is recommended when radial scars or complex sclerosing lesions are diagnosed on core biopsy, based on series showing that 8 to 17 percent of surgical specimens at subsequent excision are positive for malignancy. These high upgrade rates have led to the suggestion that these lesions may actually be premalignant lesions, progressing from scar to hyperplasia to carcinoma. This, however, is controversial, and more recent series suggest the presence of undiagnosed cancer is much lower, with upgrade rates in the range of 0.6 to 3.6 percent. The upgrade rate is even lower with large-volume (eg, vacuum-assisted) needle biopsy.   In a meta-analysis of over 3000 patients with radial scars, the upgrade rate after vacuum-assisted biopsy to invasive and in situ cancer was only 0 and 1 percent, respectively. In a study of 50 patients with radial scar who were followed by active surveillance, no patient progressed on interval imaging at 16 months. The current recommendations from the American Society of Breast Surgeons state that most radial scars "should be excised, although imaging follow-up is reasonable for small, image-detected radial scars that are completely removed or well-sampled with large-gauge devices and in the setting of imaging-pathology concordance".  No additional treatment beyond excision is needed for radial scars. The risk of subsequent breast cancer after excision in this  population is small, and chemoprevention is not indicated.  Face-to-face time spent with the patient and accompanying care providers(if present) was 30 minutes, with more than 50% of the time spent counseling, educating, and coordinating care of the patient.    These notes generated with voice recognition software. I apologize for typographical errors.  Campbell Lerner M.D., FACS 08/17/2023, 10:11 AM

## 2023-08-18 ENCOUNTER — Encounter: Payer: Self-pay | Admitting: Surgery

## 2023-08-18 ENCOUNTER — Ambulatory Visit (INDEPENDENT_AMBULATORY_CARE_PROVIDER_SITE_OTHER): Payer: 59 | Admitting: Surgery

## 2023-08-18 VITALS — BP 127/82 | HR 78 | Temp 98.3°F | Ht 61.25 in | Wt 179.2 lb

## 2023-08-18 DIAGNOSIS — N6489 Other specified disorders of breast: Secondary | ICD-10-CM

## 2023-08-18 NOTE — Patient Instructions (Signed)
Breast Self-Awareness Breast self-awareness means being familiar with how your breasts look and feel. It involves checking your breasts regularly and telling your health care provider about any changes. Practicing breast self-awareness helps to maintain breast health. Sometimes, changes are not harmful (are benign). Other times, a change in your breasts can be a sign of a serious medical problem. Being familiar with the look and feel of your breasts can help you catch a breast problem while it is still small and can be treated. You should do breast self-exams even if you have breast implants. What you need: A mirror. A well-lit room. A pillow or other soft object. How to do a breast self-exam A breast self-exam is one way to learn what is normal for your breasts and whether your breasts are changing. To do a breast self-exam: Look for changes  Remove all the clothing above your waist. Stand in front of a mirror in a room with good lighting. Put your hands down at your sides. Compare your breasts in the mirror. Look for differences between them (asymmetry), such as: Differences in shape. Differences in size. Puckers, dips, and bumps in one breast and not the other. Look at each breast for changes in the skin, such as: Redness. Scaly areas. Skin thickening. Dimpling. Open sores (ulcers). Look for changes in your nipples, such as: Discharge. Bleeding. Dimpling. Redness. A nipple that looks pushed in (retracted), or that has changed position. Feel for changes Carefully feel your breasts for lumps and changes. It is best to do this self-exam while lying down. Follow these steps to feel each breast: Place a pillow under the shoulder of one side of your body. Place the arm of that side of your body behind your head. Feel the breast of that side of your body using the hand of the opposite arm. To do this: Start in the nipple area and use the pads of your three middle fingers to make -inch  (2 cm) overlapping circles. Use light, medium, and then firm pressure as you feel your breast, gently covering the entire breast area and armpit. Continue the overlapping circles, moving downward over the breast until you feel your ribs below your breast. Then, make circles with your fingers going upward until you reach your collarbone. Next, make circles by moving outward across your breast and into your armpit area. Squeeze the nipple. Check for discharge and lumps. Repeat steps 1-7 to check your other breast. Sit or stand in the tub or shower. With soapy water on your skin, feel each breast the same way you did when you were lying down. Write down what you find Writing down what you find can help you remember what to discuss with your health care provider. Write down: What is normal for each breast. Any changes that you find in each breast. These include: The kind of changes you find. Any pain or tenderness. Size and location of any lumps. Where you are in your menstrual cycle, if you are still getting your menstrual period (menstruating). General tips If you are breastfeeding, the best time to examine your breasts is after a feeding or after using a breast pump. If you menstruate, the best time to examine your breasts is 5-7 days after your menstrual period. Breasts are generally lumpier during menstrual periods, and it may be more difficult to notice changes. With time and practice, you will become more familiar with the differences in your breasts and more comfortable with the exam. Contact a health care  provider if: You see a change in the shape or size of your breasts or nipples. You see a change in the skin of your breast or nipples, such as a reddened or scaly area. You have unusual discharge from your nipples. You find a new lump or thick area. You have breast pain. You have any concerns about your breast health. Summary Breast self-awareness includes looking for physical  changes in your breasts and feeling for any changes within your breasts. Breast self-awareness should be done in front of a mirror in a well-lit room. If you menstruate, the best time to examine your breasts is 5-7 days after your menstrual period. Tell your health care provider about any changes you notice in your breasts. Changes include changes in size, changes on the skin, pain or tenderness, or unusual fluid from your nipples. This information is not intended to replace advice given to you by your health care provider. Make sure you discuss any questions you have with your health care provider. Document Revised: 12/12/2021 Document Reviewed: 05/09/2021 Elsevier Patient Education  2024 [employer].

## 2023-08-21 ENCOUNTER — Telehealth: Payer: Self-pay | Admitting: Family Medicine

## 2023-08-21 MED ORDER — EMPAGLIFLOZIN 10 MG PO TABS: 10.0000 mg | ORAL_TABLET | Freq: Every day | ORAL | 2 refills | Status: DC

## 2023-08-21 NOTE — Telephone Encounter (Signed)
Copied from CRM [id number]. Topic: Clinical - Prescription Issue >> Aug 21, 2023  9:53 AM Florestine Avers wrote: Reason for CRM: The patient states that her pharmacy reached out to Dr. Milinda Antis for an alternative medication for her Jardiance. Patient is calling to see the status of her new prescription being sent to pharmacy on file and is also requesting a phone call. She said she called and left a message prior, there are no previous CRMs on patients chart.

## 2023-08-21 NOTE — Addendum Note (Signed)
Addended by: Roxy Manns A on: 08/21/2023 03:20 PM   Modules accepted: Orders

## 2023-08-21 NOTE — Telephone Encounter (Signed)
 Pt notified Rx sent to pharmacy

## 2023-08-21 NOTE — Telephone Encounter (Signed)
I sent the jardiance again in case you need a new prescription for the prior auth    Let me know if/how it goes through Thanks

## 2023-08-21 NOTE — Telephone Encounter (Signed)
Called patient states that she only has 2 tablets left of her jardiance. I see where prior Berkley Harvey was requested but do not see any documentation on where we are with it. I have started authorization. Key is  [id number]. Advised patient will send message to PCP for review.

## 2023-09-07 ENCOUNTER — Encounter: Payer: Self-pay | Admitting: Podiatry

## 2023-09-07 ENCOUNTER — Ambulatory Visit: Payer: 59 | Admitting: Podiatry

## 2023-09-07 VITALS — Ht 61.25 in | Wt 179.2 lb

## 2023-09-07 DIAGNOSIS — E119 Type 2 diabetes mellitus without complications: Secondary | ICD-10-CM

## 2023-09-07 DIAGNOSIS — M79674 Pain in right toe(s): Secondary | ICD-10-CM | POA: Diagnosis not present

## 2023-09-07 DIAGNOSIS — B351 Tinea unguium: Secondary | ICD-10-CM | POA: Diagnosis not present

## 2023-09-07 DIAGNOSIS — M79675 Pain in left toe(s): Secondary | ICD-10-CM

## 2023-09-07 NOTE — Progress Notes (Signed)
  Subjective:  Patient ID: Taylor Grimes, female    DOB: 04/05/1961,  MRN: [id number]  63 y.o. female presents to clinic with  preventative diabetic foot care and painful, elongated thickened toenails x 10 which are symptomatic when wearing enclosed shoe gear. This interferes with his/her daily activities.  Chief Complaint  Patient presents with   Nail Problem    Pt is here for [facility] last A1C wa 7.8 PCP is Dr Milinda Antis and LOV was in December.    New problem(s): None   PCP is [facility], Idamae Schuller A, MD.  No Known Allergies  Review of Systems: Negative except as noted in the HPI.   Objective:  Taylor Grimes is a pleasant 63 y.o. female in NAD. AAO x 3.  Vascular Examination: Vascular status intact b/l with palpable pedal pulses. CFT immediate b/l. No edema. No pain with calf compression b/l. Skin temperature gradient WNL b/l. No ischemia or gangrene noted b/l LE. No cyanosis or clubbing noted b/l LE.  Neurological Examination: Sensation grossly intact b/l with 10 gram monofilament.  Dermatological Examination: Pedal skin with normal turgor, texture and tone b/l. Toenails 1-5 b/l thick, discolored, elongated with subungual debris and pain on dorsal palpation. Minimal hyperkeratos(is/es) noted submet head 1 b/l and submet head 4 left foot.  Musculoskeletal Examination: Muscle strength 5/5 to b/l LE. No pain, crepitus or joint limitation noted with ROM bilateral LE. No gross bony deformities bilaterally.  Radiographs: None  Last A1c:      Latest Ref Rng & Units 06/29/2023    8:06 AM 03/18/2023    7:39 AM 12/22/2022    9:34 AM 09/15/2022    8:02 AM  Hemoglobin A1C  Hemoglobin-A1c 4.0 - 5.6 % 6.9  7.8  8.0  8.5      Assessment:   1. Pain due to onychomycosis of toenails of both feet   2. Diabetes mellitus without complication (HCC)    Plan:  Patient was evaluated and treated. All patient's and/or POA's questions/concerns addressed on today's visit. Mycotic toenails 1-5 debrided in length  and girth without incident.  As a courtesy, calluses submet head 1 b/l and submet head 4 left foot filed with bur without incident. Continue daily foot inspections and monitor blood glucose per PCP/Endocrinologist's recommendations.Continue soft, supportive shoe gear daily. Report any pedal injuries to medical professional. Call office if there are any quesitons/concerns. -Patient/POA to call should there be question/concern in the interim.  Return in about 3 months (around 12/05/2023).  Freddie Breech, DPM      [facility] LOCATION: [address], [place] [address]                   Office [phone]   [facility] LOCATION: [address] [place], [place] [address] Office [phone]

## 2023-09-09 ENCOUNTER — Other Ambulatory Visit: Payer: Self-pay | Admitting: Family Medicine

## 2023-09-14 ENCOUNTER — Encounter: Payer: Self-pay | Admitting: Family Medicine

## 2023-09-28 ENCOUNTER — Encounter: Payer: Self-pay | Admitting: Family Medicine

## 2023-09-28 ENCOUNTER — Ambulatory Visit (INDEPENDENT_AMBULATORY_CARE_PROVIDER_SITE_OTHER): Payer: Medicaid Other | Admitting: Family Medicine

## 2023-09-28 VITALS — BP 130/80 | HR 69 | Temp 97.9°F | Ht 61.25 in | Wt 177.1 lb

## 2023-09-28 DIAGNOSIS — Z7985 Long-term (current) use of injectable non-insulin antidiabetic drugs: Secondary | ICD-10-CM

## 2023-09-28 DIAGNOSIS — E039 Hypothyroidism, unspecified: Secondary | ICD-10-CM | POA: Diagnosis not present

## 2023-09-28 DIAGNOSIS — E786 Lipoprotein deficiency: Secondary | ICD-10-CM

## 2023-09-28 DIAGNOSIS — E66812 Obesity, class 2: Secondary | ICD-10-CM

## 2023-09-28 DIAGNOSIS — E119 Type 2 diabetes mellitus without complications: Secondary | ICD-10-CM

## 2023-09-28 DIAGNOSIS — Z7984 Long term (current) use of oral hypoglycemic drugs: Secondary | ICD-10-CM | POA: Diagnosis not present

## 2023-09-28 DIAGNOSIS — I10 Essential (primary) hypertension: Secondary | ICD-10-CM

## 2023-09-28 DIAGNOSIS — Z6835 Body mass index (BMI) 35.0-35.9, adult: Secondary | ICD-10-CM

## 2023-09-28 DIAGNOSIS — Z114 Encounter for screening for human immunodeficiency virus [HIV]: Secondary | ICD-10-CM | POA: Insufficient documentation

## 2023-09-28 DIAGNOSIS — E785 Hyperlipidemia, unspecified: Secondary | ICD-10-CM

## 2023-09-28 DIAGNOSIS — E1169 Type 2 diabetes mellitus with other specified complication: Secondary | ICD-10-CM | POA: Diagnosis not present

## 2023-09-28 DIAGNOSIS — R251 Tremor, unspecified: Secondary | ICD-10-CM

## 2023-09-28 DIAGNOSIS — Z1159 Encounter for screening for other viral diseases: Secondary | ICD-10-CM | POA: Insufficient documentation

## 2023-09-28 LAB — POCT GLYCOSYLATED HEMOGLOBIN (HGB A1C): Hemoglobin A1C: 8 % — AB (ref 4.0–5.6)

## 2023-09-28 LAB — COMPREHENSIVE METABOLIC PANEL
ALT: 22 U/L (ref 0–35)
AST: 30 U/L (ref 0–37)
Albumin: 3.8 g/dL (ref 3.5–5.2)
Alkaline Phosphatase: 112 U/L (ref 39–117)
BUN: 9 mg/dL (ref 6–23)
CO2: 27 meq/L (ref 19–32)
Calcium: 9.4 mg/dL (ref 8.4–10.5)
Chloride: 103 meq/L (ref 96–112)
Creatinine, Ser: 0.76 mg/dL (ref 0.40–1.20)
GFR: 82.67 mL/min (ref >60.00)
Glucose, Bld: 158 mg/dL — ABNORMAL HIGH (ref 70–99)
Potassium: 4.3 meq/L (ref 3.5–5.1)
Sodium: 141 meq/L (ref 135–145)
Total Bilirubin: 1.2 mg/dL (ref 0.2–1.2)
Total Protein: 7 g/dL (ref 6.0–8.3)

## 2023-09-28 LAB — TSH: TSH: 0.44 u[IU]/mL (ref 0.35–5.50)

## 2023-09-28 LAB — LIPID PANEL
Cholesterol: 143 mg/dL (ref 0–200)
HDL: 42.4 mg/dL (ref >39.00)
LDL Cholesterol: 64 mg/dL (ref 0–99)
NonHDL: 100.9
Total CHOL/HDL Ratio: 3
Triglycerides: 184 mg/dL — ABNORMAL HIGH (ref 0.0–149.0)
VLDL: 36.8 mg/dL (ref 0.0–40.0)

## 2023-09-28 NOTE — Progress Notes (Signed)
 Subjective:    Patient ID: Taylor Grimes, female    DOB: 04-18-1961, 63 y.o.   MRN: [id number]  HPI  Wt Readings from Last 3 Encounters:  09/28/23 177 lb 2 oz (80.3 kg)  09/07/23 179 lb 3.2 oz (81.3 kg)  08/18/23 179 lb 3.2 oz (81.3 kg)   33.19 kg/m  Vitals:   09/28/23 0937  BP: 130/80  Pulse: 69  Temp: 97.9 F (36.6 C)  SpO2: 99%    Pt presents for follow up of DM2 and chronic health problems   HTN bp is stable today  No cp or palpitations or headaches or edema  No side effects to medicines  BP Readings from Last 3 Encounters:  09/28/23 130/80  08/18/23 127/82  07/03/23 122/80    Losartan 50 mg daily   Pulse Readings from Last 3 Encounters:  09/28/23 69  08/18/23 78  07/03/23 94    Dm2 Lab Results  Component Value Date   HGBA1C 8.0 (A) 09/28/2023   HGBA1C 6.9 (A) 06/29/2023   HGBA1C 7.8 (H) 03/18/2023   Today up to 8  Metformin  1000 mg bid  Trulicity 1.5 mg weekly  Amaryl 2 mg daily -- off that  Jardiance 10 mg daily   Is on a pill for rosacea - antibiotic/ ? Name   No steroids   Was on a cruise for a week in jan -ate poorly Then got back to normal   Not eating as well  Portions are fine  Some fruit-bananas and fruit   Some ginger ale and crackers when sick 2 days    Needs foot exam   Pna vaccine utd   Lab Results  Component Value Date   MICROALBUR <0.7 03/25/2023   MICROALBUR 2.6 (H) 03/17/2022  Ratio of 17   six mo ago   Hyperlipidemia Lab Results  Component Value Date   CHOL 175 03/18/2023   HDL 35.00 (L) 03/18/2023   LDLCALC 102 (H) 03/18/2023   LDLDIRECT 111.0 08/19/2019   TRIG 189.0 (H) 03/18/2023   CHOLHDL 5 03/18/2023   Atorvastatin 40 mg daily - this was increase in December  Due for a check   Is eating bacon -on pc at a time    Hypothyroidism  Pt has no clinical changes No change in energy level/ hair or skin/ edema and no tremor Lab Results  Component Value Date   TSH 0.13 (L) 07/03/2023    Sees Dr  Elvera Lennox for this  Levothyroxine 100 mcg daily   Needs [place] to check this for her   Issues with a tremor     Patient Active Problem List   Diagnosis Date Noted   Encounter for hepatitis C screening test for low risk patient 09/28/2023   Encounter for screening for HIV 09/28/2023   Long-term current use of injectable noninsulin antidiabetic medication 03/25/2023   Complex sclerosing lesion of right breast 01/15/2023   History of colonic polyps    Polyp of cecum    Yeast dermatitis 10/15/2021   Encounter for screening mammogram for breast cancer 10/04/2021   Hyperlipidemia associated with type 2 diabetes mellitus (HCC) 08/17/2018   Hypertrophic toenail 02/02/2018   Anxiety disorder 09/12/2015   Low HDL (under 40) 10/24/2014   Routine general medical examination at a health care facility 03/08/2014   History of endometrial cancer 12/15/2012   Colon cancer screening 11/01/2012   DM type 2 (diabetes mellitus, type 2) (HCC) 06/30/2012   Varicose veins of leg with swelling, left  11/18/2011   Class 2 obesity due to excess calories with body mass index (BMI) of 35.0 to 35.9 in adult 11/12/2010   Essential hypertension 07/12/2009   Hypothyroidism (acquired) 05/16/2009   Tremor 04/10/2009   Past Medical History:  Diagnosis Date   Anxiety    Breast lesion    benign   Diabetes mellitus without complication (HCC)    Elevated transaminase level    ? fatty liver    History of endometrial cancer 12/15/2012   FIGO Grade 2 endometrioid adenocarcinoma with squamous differentiation involving the mucosa only.  From SGO Guidelines:  Recommended surveillance after treatment of endometrial cancer includes a follow-up visit every 3-6 months for 2 years, then every 6 months for 3 years, and annually thereafter. Each follow-up visit should include a thorough patient history; elicitation and investigation of any new symptoms associated with recurrence, such as vaginal bleeding, pelvic pain, weight loss, or  lethargy; and a thorough speculum, pelvic, and rectovaginal examination   HTN (hypertension)    Hypothyroid    UTI (urinary tract infection)    Past Surgical History:  Procedure Laterality Date   ABDOMINAL HYSTERECTOMY     BREAST BIOPSY     benign cyst 2011   BREAST BIOPSY Right 01/06/2023   [place] RT BREAST BX W LOC DEV 1ST LESION IMG BX SPEC US GUIDE 01/06/2023 [facility]   CATARACT EXTRACTION W/PHACO Right 01/07/2022   Procedure: CATARACT EXTRACTION PHACO AND INTRAOCULAR LENS PLACEMENT (IOC) RIGHT DIABETIC 8.04 01:01.7;  Surgeon: Galen Manila, MD;  Location: [facility];  Service: Ophthalmology;  Laterality: Right;  Diabetic   CATARACT EXTRACTION W/PHACO Left 10/07/2022   Procedure: CATARACT EXTRACTION PHACO AND INTRAOCULAR LENS PLACEMENT (IOC) LEFT DIABETIC 4.93 00:40.7;  Surgeon: Galen Manila, MD;  Location: [facility];  Service: Ophthalmology;  Laterality: Left;  Diabetic   CESAREAN SECTION     COLONOSCOPY WITH PROPOFOL N/A 11/20/2021   Procedure: COLONOSCOPY WITH PROPOFOL;  Surgeon: Toney Reil, MD;  Location: [facility];  Service: Gastroenterology;  Laterality: N/A;   INCISION AND DRAINAGE ABSCESS Right 08/16/2021   Procedure: INCISION AND DRAINAGE ABDOMINAL WALL ABSCESS;  Surgeon: Campbell Lerner, MD;  Location: [facility];  Service: General;  Laterality: Right;   LAPAROSCOPIC TOTAL HYSTERECTOMY  11/2012   Social History   Tobacco Use   Smoking status: Former    Current packs/day: 0.00    Types: Cigarettes    Quit date: 07/22/1979    Years since quitting: 44.2    Passive exposure: Never   Smokeless tobacco: Never   Tobacco comments:    briefly smoked at 16.  (May occasionally have 1 cig)  Vaping Use   Vaping status: Never Used  Substance Use Topics   Alcohol use: Yes    Comment: rarely   Drug use: No   Family History  Problem Relation Age of Onset   Hypothyroidism Mother    Alzheimer's disease Mother    Stroke Father    Deep vein  thrombosis Sister    Colon cancer Neg Hx    Esophageal cancer Neg Hx    Stomach cancer Neg Hx    Rectal cancer Neg Hx    Breast cancer Neg Hx    No Known Allergies Current Outpatient Medications on File Prior to Visit  Medication Sig Dispense Refill   atorvastatin (LIPITOR) 40 MG tablet Take 1 tablet (40 mg total) by mouth daily. 90 tablet 3   clotrimazole (LOTRIMIN) 1 % cream APPLY 1 APPLICATION. TOPICALLY 2 (TWO) TIMES  DAILY. 30 g 0   Dulaglutide (TRULICITY) 1.5 MG/0.5ML SOAJ Inject 1.5 mg into the skin once a week. 2 mL 2   empagliflozin (JARDIANCE) 10 MG TABS tablet Take 1 tablet (10 mg total) by mouth daily. 90 tablet 2   levothyroxine (SYNTHROID) 100 MCG tablet Take 1 tablet (100 mcg total) by mouth daily. 45 tablet 3   losartan (COZAAR) 100 MG tablet TAKE 1/2 TABLET BY MOUTH DAILY 15 tablet 2   metFORMIN (GLUCOPHAGE) 1000 MG tablet TAKE 1 TABLET (1,000 MG TOTAL) BY MOUTH TWICE A DAY WITH FOOD 180 tablet 1   No current facility-administered medications on file prior to visit.    Review of Systems  Constitutional:  Negative for activity change, appetite change, fatigue, fever and unexpected weight change.  HENT:  Negative for congestion, ear pain, rhinorrhea, sinus pressure and sore throat.   Eyes:  Negative for pain, redness and visual disturbance.  Respiratory:  Negative for cough, shortness of breath and wheezing.   Cardiovascular:  Negative for chest pain and palpitations.  Gastrointestinal:  Negative for abdominal pain, blood in stool, constipation and diarrhea.  Endocrine: Negative for polydipsia and polyuria.  Genitourinary:  Negative for dysuria, frequency and urgency.  Musculoskeletal:  Negative for arthralgias, back pain and myalgias.  Skin:  Negative for pallor and rash.  Allergic/Immunologic: Negative for environmental allergies.  Neurological:  Positive for tremors. Negative for dizziness, syncope and headaches.  Hematological:  Negative for adenopathy. Does not  bruise/bleed easily.  Psychiatric/Behavioral:  Negative for decreased concentration and dysphoric mood. The patient is not nervous/anxious.        Objective:   Physical Exam Constitutional:      General: She is not in acute distress.    Appearance: Normal appearance. She is well-developed. She is not ill-appearing or diaphoretic.  HENT:     Head: Normocephalic and atraumatic.  Eyes:     Conjunctiva/sclera: Conjunctivae normal.     Pupils: Pupils are equal, round, and reactive to light.  Neck:     Thyroid: No thyromegaly.     Vascular: No carotid bruit or JVD.  Cardiovascular:     Rate and Rhythm: Normal rate and regular rhythm.     Heart sounds: Normal heart sounds.     No gallop.  Pulmonary:     Effort: Pulmonary effort is normal. No respiratory distress.     Breath sounds: Normal breath sounds. No wheezing or rales.  Abdominal:     General: There is no distension or abdominal bruit.     Palpations: Abdomen is soft.  Musculoskeletal:     Cervical back: Normal range of motion and neck supple.     Right lower leg: No edema.     Left lower leg: No edema.  Lymphadenopathy:     Cervical: No cervical adenopathy.  Skin:    General: Skin is warm and dry.     Coloration: Skin is not pale.     Findings: No rash.  Neurological:     Mental Status: She is alert.     Coordination: Coordination normal.     Deep Tendon Reflexes: Reflexes are normal and symmetric. Reflexes normal.     Comments: No obv tremor today  Psychiatric:        Mood and Affect: Mood normal.           Assessment & Plan:   Problem List Items Addressed This Visit       Cardiovascular and Mediastinum   Essential hypertension  bp in fair control at this time  BP Readings from Last 1 Encounters:  09/28/23 130/80   No changes needed Most recent labs reviewed  Disc lifstyle change with low sodium diet and exercise  Plan to continue losartan 50 mg daily         Relevant Orders   Comprehensive  metabolic panel     Endocrine   Hypothyroidism (acquired)   Pt wants TSH checked for her endocrinologist Also c/o tremor (not obs)       Relevant Orders   TSH   Hyperlipidemia associated with type 2 diabetes mellitus (HCC)   Lab today  Disc goals for lipids and reasons to control them Rev last labs with pt Rev low sat fat diet in detail  We increase atorvastatin to 40 at last visit Diet is sub optimal       Relevant Orders   Lipid panel   Comprehensive metabolic panel   DM type 2 (diabetes mellitus, type 2) (HCC) - Primary   Lab Results  Component Value Date   HGBA1C 8.0 (A) 09/28/2023   HGBA1C 6.9 (A) 06/29/2023   HGBA1C 7.8 (H) 03/18/2023   Eating is not optimal  Took a cruise/not back on track  Not exercising -discussed IMPORTANCE of muscle building activity esp on GLP-1  Declines dm teaching Normal foot exam Utd pna vaccine   Lab today for chol  Microalb utd  Eye exam utd On arb and statin  Metformin  1000 mg bid  Trulicity 1.5 mg weekly  Amaryl 2 mg daily -- off that  Jardiance 10 mg daily    Follow up 3 mo  Has seen pharmacist as well   Encouraged weight loss Encouraged low glycemic diet         Relevant Orders   POCT HgB A1C (Completed)     Other   Tremor   Pt mentions this worsens when she is anxious Will check TSH for her endocrinologist today   Plan follow up to discuss tremor when able       Low HDL (under 40)   Lipids today Encouraged more exercise as tolerated       Relevant Orders   Lipid panel   Encounter for screening for HIV   HIv screen Low risk       Relevant Orders   HIV Antibody (routine testing w rflx)   Encounter for hepatitis C screening test for low risk patient   Hep C screen Low risk      Relevant Orders   Hepatitis C Antibody   Class 2 obesity due to excess calories with body mass index (BMI) of 35.0 to 35.9 in adult   Discussed how this problem influences overall health and the risks it imposes   Reviewed plan for weight loss with lower calorie diet (via better food choices (lower glycemic and portion control) along with exercise building up to or more than 30 minutes 5 days per week including some aerobic activity and strength training   Taking GLP-1 Diet not optimal Discussed need for weight

## 2023-09-28 NOTE — Assessment & Plan Note (Signed)
 Pt wants TSH checked for her endocrinologist Also c/o tremor (not obs)

## 2023-09-28 NOTE — Assessment & Plan Note (Signed)
 HIv screen Low risk

## 2023-09-28 NOTE — Assessment & Plan Note (Signed)
 Lab today  Disc goals for lipids and reasons to control them Rev last labs with pt Rev low sat fat diet in detail  We increase atorvastatin to 40 at last visit Diet is sub optimal

## 2023-09-28 NOTE — Assessment & Plan Note (Signed)
 bp in fair control at this time  BP Readings from Last 1 Encounters:  09/28/23 130/80   No changes needed Most recent labs reviewed  Disc lifstyle change with low sodium diet and exercise  Plan to continue losartan 50 mg daily

## 2023-09-28 NOTE — Assessment & Plan Note (Signed)
 Hep C screen Low risk

## 2023-09-28 NOTE — Patient Instructions (Addendum)
 Stay active  Walking  Add some strength training to your routine, this is important for bone and brain health and can reduce your risk of falls and help your body use insulin properly and regulate weight  Light weights, exercise bands , and internet videos are a good way to start  Yoga (chair or regular), machines , floor exercises or a gym with machines are also good options   Stick with diabetic diet Try to get most of your carbohydrates from produce (with the exception of white potatoes) and whole grains Eat less bread/pasta/rice/snack foods/cereals/sweets and other items from the middle of the grocery store (processed carbs)   We need to work on more protein and more muscle building activity  You need protein with every meal  The following are examples of protein in diet  Meat  Fish  Eggs  Dairy products  Soy products  Oat milk  Almond milk Nuts and nut butters  Dried beans    Lab today for  Cholesterol Chem levels Thyroid  Hep C and HIV screen    If thyroid levels are ok - schedule a visit for tremor to talk some more (we don't have time today)

## 2023-09-28 NOTE — Assessment & Plan Note (Addendum)
 Lipids today Encouraged more exercise as tolerated

## 2023-09-28 NOTE — Assessment & Plan Note (Signed)
 Lab Results  Component Value Date   HGBA1C 8.0 (A) 09/28/2023   HGBA1C 6.9 (A) 06/29/2023   HGBA1C 7.8 (H) 03/18/2023   Eating is not optimal  Took a cruise/not back on track  Not exercising -discussed IMPORTANCE of muscle building activity esp on GLP-1  Declines dm teaching Normal foot exam Utd pna vaccine   Lab today for chol  Microalb utd  Eye exam utd On arb and statin  Metformin  1000 mg bid  Trulicity 1.5 mg weekly  Amaryl 2 mg daily -- off that  Jardiance 10 mg daily    Follow up 3 mo  Has seen pharmacist as well   Encouraged weight loss Encouraged low glycemic diet

## 2023-09-28 NOTE — Assessment & Plan Note (Signed)
 Pt mentions this worsens when she is anxious Will check TSH for her endocrinologist today   Plan follow up to discuss tremor when able

## 2023-09-28 NOTE — Assessment & Plan Note (Signed)
 Discussed how this problem influences overall health and the risks it imposes  Reviewed plan for weight loss with lower calorie diet (via better food choices (lower glycemic and portion control) along with exercise building up to or more than 30 minutes 5 days per week including some aerobic activity and strength training   Taking GLP-1 Diet not optimal Discussed need for weight

## 2023-09-29 LAB — HIV ANTIBODY (ROUTINE TESTING W REFLEX): HIV 1&2 Ab, 4th Generation: NONREACTIVE

## 2023-09-29 LAB — HEPATITIS C ANTIBODY: Hepatitis C Ab: NONREACTIVE

## 2023-10-12 ENCOUNTER — Other Ambulatory Visit: Payer: Self-pay | Admitting: Family Medicine

## 2023-11-05 ENCOUNTER — Other Ambulatory Visit: Payer: Self-pay | Admitting: Family Medicine

## 2023-11-05 DIAGNOSIS — E119 Type 2 diabetes mellitus without complications: Secondary | ICD-10-CM

## 2023-11-09 NOTE — Telephone Encounter (Signed)
 Last filled on 08/12/23 #2 mL/ 2 refill  F/u scheduled on 12/29/23

## 2023-11-27 ENCOUNTER — Other Ambulatory Visit: Payer: Self-pay | Admitting: Family Medicine

## 2023-11-27 MED ORDER — LOSARTAN POTASSIUM 100 MG PO TABS: 50.0000 mg | ORAL_TABLET | Freq: Every day | ORAL | 0 refills | Status: DC

## 2023-11-27 NOTE — Telephone Encounter (Signed)
 Copied from CRM [id number]. Topic: Clinical - Medication Refill >> Nov 27, 2023 11:00 AM Juleen Oakland F wrote: Medication: losartan  (COZAAR ) 100 MG tablet [[id number]]  Has the patient contacted their pharmacy? Yes (Agent: If no, request that the patient contact the pharmacy for the refill. If patient does not wish to contact the pharmacy document the reason why and proceed with request.) (Agent: If yes, when and what did the pharmacy advise?)  This is the patient's preferred pharmacy:  [id number] - [place], [place] - [address] [place] [address] Phone: [phone] Fax: [phone]  Is this the correct pharmacy for this prescription? Yes If no, delete pharmacy and type the correct one.   Has the prescription been filled recently? Yes  Is the patient out of the medication? No  Has the patient been seen for an appointment in the last year OR does the patient have an upcoming appointment? Yes  Can we respond through MyChart? Yes  Agent: Please be advised that Rx refills may take up to 3 business days. We ask that you follow-up with your pharmacy.

## 2023-12-07 ENCOUNTER — Ambulatory Visit: Payer: 59 | Admitting: Podiatry

## 2023-12-07 ENCOUNTER — Encounter: Payer: Self-pay | Admitting: Podiatry

## 2023-12-07 DIAGNOSIS — M79674 Pain in right toe(s): Secondary | ICD-10-CM | POA: Diagnosis not present

## 2023-12-07 DIAGNOSIS — B351 Tinea unguium: Secondary | ICD-10-CM | POA: Diagnosis not present

## 2023-12-07 DIAGNOSIS — M79675 Pain in left toe(s): Secondary | ICD-10-CM

## 2023-12-07 DIAGNOSIS — E119 Type 2 diabetes mellitus without complications: Secondary | ICD-10-CM

## 2023-12-12 NOTE — Progress Notes (Signed)
 ANNUAL DIABETIC FOOT EXAM  Subjective: Taylor Grimes presents today for annual diabetic foot exam.  Chief Complaint  Patient presents with   Diabetes    "Trim my toenails. " Dr. Deri Fleet - 09/28/2023; A1c - 8.0   Patient confirms h/o diabetes.  Patient denies any h/o foot wounds.  Tower, Manley Seeds, MD is patient's PCP.  Past Medical History:  Diagnosis Date   Anxiety    Breast lesion    benign   Diabetes mellitus without complication (HCC)    Elevated transaminase level    ? fatty liver    History of endometrial cancer 12/15/2012   FIGO Grade 2 endometrioid adenocarcinoma with squamous differentiation involving the mucosa only.  From [facility] Guidelines:  Recommended surveillance after treatment of endometrial cancer includes a follow-up visit every 3-6 months for 2 years, then every 6 months for 3 years, and annually thereafter. Each follow-up visit should include a thorough patient history; elicitation and investigation of any new symptoms associated with recurrence, such as vaginal bleeding, pelvic pain, weight loss, or lethargy; and a thorough speculum, pelvic, and rectovaginal examination   HTN (hypertension)    Hypothyroid    UTI (urinary tract infection)    Patient Active Problem List   Diagnosis Date Noted   Encounter for hepatitis C screening test for low risk patient 09/28/2023   Encounter for screening for HIV 09/28/2023   Long-term current use of injectable noninsulin antidiabetic medication 03/25/2023   Complex sclerosing lesion of right breast 01/15/2023   History of colonic polyps    Polyp of cecum    Yeast dermatitis 10/15/2021   Encounter for screening mammogram for breast cancer 10/04/2021   Hyperlipidemia associated with type 2 diabetes mellitus (HCC) 08/17/2018   Hypertrophic toenail 02/02/2018   Anxiety disorder 09/12/2015   Low HDL (under 40) 10/24/2014   Routine general medical examination at a health care facility 03/08/2014   History of  endometrial cancer 12/15/2012   Colon cancer screening 11/01/2012   DM type 2 (diabetes mellitus, type 2) (HCC) 06/30/2012   Varicose veins of leg with swelling, left 11/18/2011   Class 2 obesity due to excess calories with body mass index (BMI) of 35.0 to 35.9 in adult 11/12/2010   Essential hypertension 07/12/2009   Hypothyroidism (acquired) 05/16/2009   Tremor 04/10/2009   Past Surgical History:  Procedure Laterality Date   ABDOMINAL HYSTERECTOMY     BREAST BIOPSY     benign cyst 2011   BREAST BIOPSY Right 01/06/2023   US  RT BREAST BX W LOC DEV 1ST LESION IMG BX SPEC US  GUIDE 01/06/2023 [facility]   CATARACT EXTRACTION W/PHACO Right 01/07/2022   Procedure: CATARACT EXTRACTION PHACO AND INTRAOCULAR LENS PLACEMENT (IOC) RIGHT DIABETIC 8.04 01:01.7;  Surgeon: Clair Crews, MD;  Location: [facility];  Service: Ophthalmology;  Laterality: Right;  Diabetic   CATARACT EXTRACTION W/PHACO Left 10/07/2022   Procedure: CATARACT EXTRACTION PHACO AND INTRAOCULAR LENS PLACEMENT (IOC) LEFT DIABETIC 4.93 00:40.7;  Surgeon: Clair Crews, MD;  Location: [facility];  Service: Ophthalmology;  Laterality: Left;  Diabetic   CESAREAN SECTION     COLONOSCOPY WITH PROPOFOL  N/A 11/20/2021   Procedure: COLONOSCOPY WITH PROPOFOL ;  Surgeon: Selena Daily, MD;  Location: [facility];  Service: Gastroenterology;  Laterality: N/A;   INCISION AND DRAINAGE ABSCESS Right 08/16/2021   Procedure: INCISION AND DRAINAGE ABDOMINAL WALL ABSCESS;  Surgeon: Flynn Hylan, MD;  Location: [facility];  Service: General;  Laterality: Right;   LAPAROSCOPIC TOTAL HYSTERECTOMY  11/2012   Current Outpatient Medications on File Prior to Visit  Medication Sig Dispense Refill   atorvastatin  (LIPITOR) 40 MG tablet Take 1 tablet (40 mg total) by mouth daily. 90 tablet 3   clotrimazole  (LOTRIMIN ) 1 % cream APPLY 1 APPLICATION. TOPICALLY 2 (TWO) TIMES DAILY. 30 g 0   Dulaglutide  (TRULICITY ) 1.5 MG/0.5ML  SOAJ INJECT 1.5 MG SUBCUTANEOUSLY ONCE A WEEK 2 mL 2   empagliflozin  (JARDIANCE ) 10 MG TABS tablet Take 1 tablet (10 mg total) by mouth daily. 90 tablet 2   levothyroxine  (SYNTHROID ) 100 MCG tablet Take 1 tablet (100 mcg total) by mouth daily. 45 tablet 3   losartan  (COZAAR ) 100 MG tablet Take 0.5 tablets (50 mg total) by mouth daily. 15 tablet 0   metFORMIN  (GLUCOPHAGE ) 1000 MG tablet TAKE 1 TABLET (1,000 MG TOTAL) BY MOUTH TWICE A DAY WITH FOOD 60 tablet 5   No current facility-administered medications on file prior to visit.    No Known Allergies Social History   Occupational History   Occupation: shift Clinical cytogeneticist: [employer]  Tobacco Use   Smoking status: Former    Current packs/day: 0.00    Types: Cigarettes    Quit date: 07/22/1979    Years since quitting: 44.4    Passive exposure: Never   Smokeless tobacco: Never   Tobacco comments:    briefly smoked at 16.  (May occasionally have 1 cig)  Vaping Use   Vaping status: Never Used  Substance and Sexual Activity   Alcohol use: Not Currently    Comment: rarely   Drug use: No   Sexual activity: Yes    Birth control/protection: Post-menopausal   Family History  Problem Relation Age of Onset   Hypothyroidism Mother    Alzheimer's disease Mother    Stroke Father    Deep vein thrombosis Sister    Colon cancer Neg Hx    Esophageal cancer Neg Hx    Stomach cancer Neg Hx    Rectal cancer Neg Hx    Breast cancer Neg Hx    Immunization History  Administered Date(s) Administered   Influenza Split 06/23/2012   Influenza, Seasonal, Injecte, Preservative Fre 05/05/2023   Influenza,inj,Quad PF,6+ Mos 05/09/2013, 06/30/2014, 06/29/2015, 05/13/2016, 05/05/2017, 08/17/2018, 06/01/2019, 06/21/2020, 05/28/2021   Janssen (J&J) SARS-COV-2 Vaccination 10/08/2019   Moderna SARS-COV2 Booster Vaccination 07/19/2020   Moderna Sars-Covid-2 Vaccination 07/19/2020   Pneumococcal Polysaccharide-23 11/01/2012, 08/23/2019   Td  04/21/2007, 08/23/2019   Zoster Recombinant(Shingrix ) 12/10/2020, 04/15/2022     Review of Systems: Negative except as noted in the HPI.   Objective: There were no vitals filed for this visit.  Taylor Grimes is a pleasant 63 y.o. female in NAD. AAO X 3.  Diabetic foot exam was performed with the following findings:   Vascular Examination: Capillary refill time immediate b/l. Vascular status intact b/l with palpable pedal pulses. Pedal hair present b/l. No pain with calf compression b/l. Skin temperature gradient WNL b/l. No cyanosis or clubbing b/l. No ischemia or gangrene noted b/l.   Neurological Examination: Sensation grossly intact b/l with 10 gram monofilament. Vibratory sensation intact b/l.   Dermatological Examination: Pedal skin with normal turgor, texture and tone b/l.  No open wounds. No interdigital macerations.   Toenails 1-5 b/l thick, discolored, elongated with subungual debris and pain on dorsal palpation.   No hyperkeratotic nor porokeratotic lesions present on today's visit.  Musculoskeletal Examination: Muscle strength 5/5 to all lower extremity muscle groups bilaterally. No pain, crepitus  or joint limitation noted with ROM bilateral LE. No gross bony deformities bilaterally.  Radiographs: None     Lab Results  Component Value Date   HGBA1C 8.0 (A) 09/28/2023   ADA Risk Categorization: Low Risk :  Patient has all of the following: Intact protective sensation No prior foot ulcer  No severe deformity Pedal pulses present  Assessment: 1. Pain due to onychomycosis of toenails of both feet   2. Diabetes mellitus without complication (HCC)   3. Encounter for diabetic foot exam (HCC)    Plan: -Consent given for treatment as described below: -Examined patient. -Diabetic foot examination performed today. -Continue diabetic foot care principles: inspect feet daily, monitor glucose as recommended by PCP and/or Endocrinologist, and follow prescribed diet per  PCP, Endocrinologist and/or dietician. -Patient to continue soft, supportive shoe gear daily. -Toenails 1-5 bilaterally were debrided in length and girth with sterile nail nippers and dremel. Pinpoint bleeding of left great toe addressed with Lumicain Hemostatic Solution, cleansed with alcohol. Triple antibiotic ointment applied. No further treatment required by patient/caregiver. -Patient/POA to call should there be question/concern in the interim. Return in about 3 months (around 03/08/2024).  Luella Sager, DPM      [place] LOCATION: [address] [place] [address]                   Office [phone]   [facility] LOCATION: [address] [place], [place] [address] Office [phone]

## 2023-12-20 ENCOUNTER — Emergency Department
Admission: EM | Admit: 2023-12-20 | Discharge: 2023-12-20 | Disposition: A | Discharge status: Home / Self Care | Attending: Emergency Medicine | Admitting: Emergency Medicine

## 2023-12-20 ENCOUNTER — Other Ambulatory Visit: Payer: Self-pay

## 2023-12-20 ENCOUNTER — Emergency Department

## 2023-12-20 DIAGNOSIS — N390 Urinary tract infection, site not specified: Secondary | ICD-10-CM

## 2023-12-20 DIAGNOSIS — E119 Type 2 diabetes mellitus without complications: Secondary | ICD-10-CM | POA: Diagnosis not present

## 2023-12-20 DIAGNOSIS — R1033 Periumbilical pain: Secondary | ICD-10-CM | POA: Diagnosis not present

## 2023-12-20 DIAGNOSIS — I10 Essential (primary) hypertension: Secondary | ICD-10-CM | POA: Diagnosis not present

## 2023-12-20 DIAGNOSIS — Z7984 Long term (current) use of oral hypoglycemic drugs: Secondary | ICD-10-CM | POA: Diagnosis not present

## 2023-12-20 DIAGNOSIS — B9689 Other specified bacterial agents as the cause of diseases classified elsewhere: Secondary | ICD-10-CM | POA: Insufficient documentation

## 2023-12-20 DIAGNOSIS — Z8542 Personal history of malignant neoplasm of other parts of uterus: Secondary | ICD-10-CM | POA: Diagnosis not present

## 2023-12-20 DIAGNOSIS — K5900 Constipation, unspecified: Secondary | ICD-10-CM | POA: Diagnosis not present

## 2023-12-20 DIAGNOSIS — Z79899 Other long term (current) drug therapy: Secondary | ICD-10-CM | POA: Diagnosis not present

## 2023-12-20 DIAGNOSIS — R109 Unspecified abdominal pain: Secondary | ICD-10-CM | POA: Diagnosis present

## 2023-12-20 DIAGNOSIS — Z794 Long term (current) use of insulin: Secondary | ICD-10-CM | POA: Diagnosis not present

## 2023-12-20 DIAGNOSIS — E039 Hypothyroidism, unspecified: Secondary | ICD-10-CM | POA: Diagnosis not present

## 2023-12-20 LAB — CBC
HCT: 44.4 % (ref 36.0–46.0)
Hemoglobin: 15.3 g/dL — ABNORMAL HIGH (ref 12.0–15.0)
MCH: 30.8 pg (ref 26.0–34.0)
MCHC: 34.5 g/dL (ref 30.0–36.0)
MCV: 89.3 fL (ref 80.0–100.0)
Platelets: 169 10*3/uL (ref 150–400)
RBC: 4.97 MIL/uL (ref 3.87–5.11)
RDW: 12.7 % (ref 11.5–15.5)
WBC: 11.9 10*3/uL — ABNORMAL HIGH (ref 4.0–10.5)
nRBC: 0 % (ref 0.0–0.2)

## 2023-12-20 LAB — URINALYSIS, ROUTINE W REFLEX MICROSCOPIC
Bacteria, UA: NONE SEEN
Bilirubin Urine: NEGATIVE
Glucose, UA: >=500 mg/dL — AB
Hgb urine dipstick: NEGATIVE
Ketones, ur: 5 mg/dL — AB
Nitrite: NEGATIVE
Protein, ur: NEGATIVE mg/dL
Specific Gravity, Urine: 1.03 (ref 1.005–1.030)
pH: 6 (ref 5.0–8.0)

## 2023-12-20 LAB — COMPREHENSIVE METABOLIC PANEL WITH GFR
ALT: 21 U/L (ref 0–44)
AST: 36 U/L (ref 15–41)
Albumin: 3.9 g/dL (ref 3.5–5.0)
Alkaline Phosphatase: 107 U/L (ref 38–126)
Anion gap: 12 (ref 5–15)
BUN: 11 mg/dL (ref 8–23)
CO2: 26 mmol/L (ref 22–32)
Calcium: 9.6 mg/dL (ref 8.9–10.3)
Chloride: 103 mmol/L (ref 98–111)
Creatinine, Ser: 0.79 mg/dL (ref 0.44–1.00)
GFR, Estimated: >60 mL/min (ref >60)
Glucose, Bld: 249 mg/dL — ABNORMAL HIGH (ref 70–99)
Potassium: 4.3 mmol/L (ref 3.5–5.1)
Sodium: 141 mmol/L (ref 135–145)
Total Bilirubin: 1.4 mg/dL — ABNORMAL HIGH (ref 0.0–1.2)
Total Protein: 7.8 g/dL (ref 6.5–8.1)

## 2023-12-20 LAB — LIPASE, BLOOD: Lipase: 62 U/L — ABNORMAL HIGH (ref 11–51)

## 2023-12-20 LAB — TROPONIN I (HIGH SENSITIVITY): Troponin I (High Sensitivity): 5 ng/L (ref <18)

## 2023-12-20 MED ORDER — DICYCLOMINE HCL 20 MG PO TABS
20.0000 mg | ORAL_TABLET | Freq: Four times a day (QID) | ORAL | 0 refills | Status: AC
Start: 2023-12-20 — End: ?

## 2023-12-20 MED ORDER — LACTULOSE 10 GM/15ML PO SOLN: 20.0000 g | Freq: Every day | ORAL | 0 refills | Status: DC | PRN

## 2023-12-20 MED ORDER — SODIUM CHLORIDE 0.9 % IV SOLN
1.0000 g | Freq: Once | INTRAVENOUS | Status: AC
  Administered 2023-12-20: 1 g via INTRAVENOUS
  Filled 2023-12-20: qty 10

## 2023-12-20 MED ORDER — CEPHALEXIN 500 MG PO CAPS: 500.0000 mg | ORAL_CAPSULE | Freq: Three times a day (TID) | ORAL | 0 refills | Status: DC

## 2023-12-20 MED ORDER — ONDANSETRON HCL 4 MG/2ML IJ SOLN
4.0000 mg | Freq: Once | INTRAMUSCULAR | Status: AC
  Administered 2023-12-20: 4 mg via INTRAVENOUS
  Filled 2023-12-20: qty 2

## 2023-12-20 MED ORDER — IOHEXOL 300 MG/ML  SOLN
100.0000 mL | Freq: Once | INTRAMUSCULAR | Status: AC | PRN
  Administered 2023-12-20: 100 mL via INTRAVENOUS

## 2023-12-20 MED ORDER — ONDANSETRON 4 MG PO TBDP: 4.0000 mg | ORAL_TABLET | Freq: Three times a day (TID) | ORAL | 0 refills | Status: DC | PRN

## 2023-12-20 MED ORDER — IBUPROFEN 400 MG PO TABS
400.0000 mg | ORAL_TABLET | Freq: Once | ORAL | Status: AC
  Administered 2023-12-20: 400 mg via ORAL
  Filled 2023-12-20: qty 1

## 2023-12-20 MED ORDER — SODIUM CHLORIDE 0.9 % IV BOLUS
1000.0000 mL | Freq: Once | INTRAVENOUS | Status: AC
  Administered 2023-12-20: 1000 mL via INTRAVENOUS

## 2023-12-20 MED ORDER — FENTANYL CITRATE PF 50 MCG/ML IJ SOSY
50.0000 ug | PREFILLED_SYRINGE | Freq: Once | INTRAMUSCULAR | Status: AC
  Administered 2023-12-20: 50 ug via INTRAVENOUS
  Filled 2023-12-20: qty 1

## 2023-12-20 NOTE — Discharge Instructions (Signed)
 Take and finish antibiotic as prescribed.  You may take Lactulose as needed for bowel movements.  You may take Bentyl and Zofran  as needed for discomfort and nausea.  Bland diet x 3 days, then slowly advance diet as tolerated.  Return to the ER for worsening symptoms, persistent vomiting, difficulty breathing or other concerns.

## 2023-12-20 NOTE — ED Provider Notes (Signed)
 Vitals:   12/20/23 0135 12/20/23 0513  BP: (!) 146/88 (!) 145/86  Pulse: 86 95  Resp: 18 (!) 24  Temp: (!) 97.5 F (36.4 C) 98.6 F (37 C)  SpO2: 100% 100%     Patient fully awake alert oriented.  She advises she started to have a little bit of increasing mild crampy pain in the abdomen again.  She would like something to take before she drives home.  Will give ibuprofen.  She advised that she is driving her own car, as her son is not able to get her right now.   Iver Marker, MD 12/20/23 [phone]

## 2023-12-20 NOTE — ED Notes (Signed)
 Spoke with Taylor Grimes with [facility] to place pt on cardiac monitor.

## 2023-12-20 NOTE — ED Provider Notes (Addendum)
 [facility] Provider Note    Event Date/Time   First MD Initiated Contact with Patient 12/20/23 0236     (approximate)   History   Abdominal Pain   HPI  Taylor Grimes is a 63 y.o. female who presents to the ED from home with a chief complaint of central abdominal pain which began around 9 PM.  Denies associated fever/chills, chest pain, shortness of breath, nausea, vomiting, dysuria or diarrhea.  Patient tried to self-induced vomiting to see if it would make her feel better; it did not.     Past Medical History   Past Medical History:  Diagnosis Date  . Anxiety   . Breast lesion    benign  . Diabetes mellitus without complication (HCC)   . Elevated transaminase level    ? fatty liver   . 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  From SGO Guidelines:  Recommended surveillance after treatment of endometrial cancer includes a follow-up visit every 3-6 months for 2 years, then every 6 months for 3 years, and annually thereafter. Each follow-up visit should include a thorough patient history; elicitation and investigation of any new symptoms associated with recurrence, such as vaginal bleeding, pelvic pain, weight loss, or lethargy; and a thorough speculum, pelvic, and rectovaginal examination  . HTN (hypertension)   . Hypothyroid   . UTI (urinary tract infection)      Active Problem List   Patient Active Problem List   Diagnosis Date Noted  . Encounter for hepatitis C screening test for low risk patient 09/28/2023  . Encounter for screening for HIV 09/28/2023  . Long-term current use of injectable noninsulin antidiabetic medication 03/25/2023  . Complex sclerosing lesion of right breast 01/15/2023  . History of colonic polyps   . Polyp of cecum   . Yeast dermatitis 10/15/2021  . Encounter for screening mammogram for breast cancer 10/04/2021  .  Hyperlipidemia associated with type 2 diabetes mellitus (HCC) 08/17/2018  . Hypertrophic toenail 02/02/2018  . Anxiety disorder 09/12/2015  . Low HDL (under 40) 10/24/2014  . Routine general medical examination at a health care facility 03/08/2014  . History of endometrial cancer 12/15/2012  . Colon cancer screening 11/01/2012  . DM type 2 (diabetes mellitus, type 2) (HCC) 06/30/2012  . Varicose veins of leg with swelling, left 11/18/2011  . Class 2 obesity due to excess calories with body mass index (BMI) of 35.0 to 35.9 in adult 11/12/2010  . Essential hypertension 07/12/2009  . Hypothyroidism (acquired) 05/16/2009  . Tremor 04/10/2009     Past Surgical History   Past Surgical History:  Procedure Laterality Date  . ABDOMINAL HYSTERECTOMY    . BREAST BIOPSY     benign cyst 2011  . BREAST BIOPSY Right 01/06/2023   US  RT BREAST BX W LOC DEV 1ST LESION IMG BX SPEC US  GUIDE 01/06/2023 [facility]  . CATARACT EXTRACTION W/PHACO Right 01/07/2022   Procedure: CATARACT EXTRACTION PHACO AND INTRAOCULAR LENS PLACEMENT (IOC) RIGHT DIABETIC 8.04 01:01.7;  Surgeon: Clair Crews, MD;  Location: [facility];  Service: Ophthalmology;  Laterality: Right;  Diabetic  . CATARACT EXTRACTION W/PHACO Left 10/07/2022   Procedure: CATARACT EXTRACTION PHACO AND INTRAOCULAR LENS PLACEMENT (IOC) LEFT DIABETIC 4.93 00:40.7;  Surgeon: Clair Crews, MD;  Location: [facility];  Service: Ophthalmology;  Laterality: Left;  Diabetic  . CESAREAN SECTION    . COLONOSCOPY WITH PROPOFOL  N/A 11/20/2021  Procedure: COLONOSCOPY WITH PROPOFOL ;  Surgeon: Selena Daily, MD;  Location: [facility];  Service: Gastroenterology;  Laterality: N/A;  . INCISION AND DRAINAGE ABSCESS Right 08/16/2021   Procedure: INCISION AND DRAINAGE ABDOMINAL WALL ABSCESS;  Surgeon: Flynn Hylan, MD;  Location: [facility] OR[facility];  Service: General;  Laterality: Right;  . LAPAROSCOPIC TOTAL HYSTERECTOMY  11/2012      Home Medications   Prior to Admission medications   Medication Sig Start Date End Date Taking? Authorizing Provider  cephALEXin (KEFLEX) 500 MG capsule Take 1 capsule (500 mg total) by mouth 3 (three) times daily. 12/20/23  Yes Arrayah Connors J, MD  dicyclomine (BENTYL) 20 MG tablet Take 1 tablet (20 mg total) by mouth every 6 (six) hours. 12/20/23  Yes Norlene Beavers, MD  ondansetron  (ZOFRAN -ODT) 4 MG disintegrating tablet Take 1 tablet (4 mg total) by mouth every 8 (eight) hours as needed for nausea or vomiting. 12/20/23  Yes Norlene Beavers, MD  atorvastatin  (LIPITOR) 40 MG tablet Take 1 tablet (40 mg total) by mouth daily. 06/29/23   Tower, Manley Seeds, MD  clotrimazole  (LOTRIMIN ) 1 % cream APPLY 1 APPLICATION. TOPICALLY 2 (TWO) TIMES DAILY. 11/24/22   Selena Daily, MD  Dulaglutide  (TRULICITY ) 1.5 MG/0.5ML SOAJ INJECT 1.5 MG SUBCUTANEOUSLY ONCE A WEEK 11/09/23   Tower, Manley Seeds, MD  empagliflozin  (JARDIANCE ) 10 MG TABS tablet Take 1 tablet (10 mg total) by mouth daily. 08/21/23   Tower, Manley Seeds, MD  lactulose (CHRONULAC) 10 GM/15ML solution Take 30 mLs (20 g total) by mouth daily as needed for mild constipation. 12/20/23  Yes Madalena Kesecker J, MD  levothyroxine  (SYNTHROID ) 100 MCG tablet Take 1 tablet (100 mcg total) by mouth daily. 07/06/23   Emilie Harden, MD  losartan  (COZAAR ) 100 MG tablet Take 0.5 tablets (50 mg total) by mouth daily. 11/27/23   Tower, Manley Seeds, MD  metFORMIN  (GLUCOPHAGE ) 1000 MG tablet TAKE 1 TABLET (1,000 MG TOTAL) BY MOUTH TWICE A DAY WITH FOOD 10/12/23   Tower, Manley Seeds, MD     Allergies  Patient has no known allergies.   Family History   Family History  Problem Relation Age of Onset  . Hypothyroidism Mother   . Alzheimer's disease Mother   . Stroke Father   . Deep vein thrombosis Sister   . Colon cancer Neg Hx   . Esophageal cancer Neg Hx   . Stomach cancer Neg Hx   . Rectal cancer Neg Hx   . Breast cancer Neg Hx      Physical Exam  Triage Vital Signs: ED Triage  Vitals  Encounter Vitals Group     BP 12/20/23 0135 (!) 146/88     Systolic BP Percentile --      Diastolic BP Percentile --      Pulse Rate 12/20/23 0135 86     Resp 12/20/23 0135 18     Temp 12/20/23 0135 (!) 97.5 F (36.4 C)     Temp Source 12/20/23 0135 Oral     SpO2 12/20/23 0135 100 %     Weight 12/20/23 0133 160 lb (72.6 kg)     Height 12/20/23 0133 5\' 1"  (1.549 m)     Head Circumference --      Peak Flow --      Pain Score 12/20/23 0134 10     Pain Loc --      Pain Education --      Exclude from Growth Chart --     Updated Vital  Signs: BP (!) 145/86 (BP Location: Left Arm)   Pulse 95   Temp 98.6 F (37 C)   Resp (!) 24   Ht 5\' 1"  (1.549 m)   Wt 74.8 kg   LMP 07/21/2008   SpO2 100%   BMI 31.18 kg/m    General: Awake, mild distress.  CV:  RRR.  Good peripheral perfusion.  Resp:  Normal effort.  CTAB. Abd:  Mild tenderness to palpation umbilicus without rebound or guarding.  No distention.  Other:  No truncal vesicles.   ED Results / Procedures / Treatments  Labs (all labs ordered are listed, but only abnormal results are displayed) Labs Reviewed  LIPASE, BLOOD - Abnormal; Notable for the following components:      Result Value   Lipase 62 (*)    All other components within normal limits  COMPREHENSIVE METABOLIC PANEL WITH GFR - Abnormal; Notable for the following components:   Glucose, Bld 249 (*)    Total Bilirubin 1.4 (*)    All other components within normal limits  CBC - Abnormal; Notable for the following components:   WBC 11.9 (*)    Hemoglobin 15.3 (*)    All other components within normal limits  URINALYSIS, ROUTINE W REFLEX MICROSCOPIC - Abnormal; Notable for the following components:   Color, Urine YELLOW (*)    APPearance CLEAR (*)    Glucose, UA >=500 (*)    Ketones, ur 5 (*)    Leukocytes,Ua TRACE (*)    All other components within normal limits  TROPONIN I (HIGH SENSITIVITY)     EKG  ED ECG REPORT I, Lashundra Shiveley J, the  attending physician, personally viewed and interpreted this ECG.   Date: 12/20/2023  EKG Time: 0514  Rate: 92  Rhythm: normal sinus rhythm  Axis: Normal  Intervals:none  ST&T Change: Nonspecific    RADIOLOGY I have independently visualized and interpreted patient's imaging study as well as noted the radiology interpretation:  CT abdomen/pelvis: No acute process, chronic cirrhotic appearance, diverticulosis  Official radiology report(s): CT ABDOMEN PELVIS W CONTRAST Result Date: 12/20/2023 CLINICAL DATA:  63 year old female with progressive abdominal pain since 20/1 100 hours. EXAM: CT ABDOMEN AND PELVIS WITH CONTRAST TECHNIQUE: Multidetector CT imaging of the abdomen and pelvis was performed using the standard protocol following bolus administration of intravenous contrast. RADIATION DOSE REDUCTION: This exam was performed according to the departmental dose-optimization program which includes automated exposure control, adjustment of the mA and/or kV according to patient size and/or use of iterative reconstruction technique. CONTRAST:  OMNIPAQUE  IOHEXOL  300 MG/ML  SOLN COMPARISON:  CT Abdomen and Pelvis 08/16/2021. FINDINGS: Lower chest: Evidence of calcified coronary artery atherosclerosis on series 2, image 4, but otherwise negative lower chest, lung bases are clear. Normal heart size. Hepatobiliary: Nodular liver (series 2, image 29). No discrete liver lesion. Gallbladder is larger compared to 2023 but there is no convincing pericholecystic inflammation. No cholelithiasis by CT. No bile duct obstruction. Pancreas: Negative. Spleen: Mild splenic enlargement since 2023, borderline to mild splenomegaly now (14 cm from the Splenic length series 2, image 30). No discrete splenic lesion. No perisplenic fluid. However, there are small caliber perisplenic varices now suspected (coronal image 44). Adrenals/Urinary Tract: Negative adrenal glands. Chronic left renal cortical atrophy appears stable.  Nonobstructed kidneys. Symmetric renal enhancement and contrast excretion to diminutive ureters. No nephrolithiasis. Unremarkable bladder. Incidental pelvic phleboliths. Stomach/Bowel: Mild retained stool in the rectum. Negative distal sigmoid. Diverticulosis throughout the descending and proximal sigmoid colon. But no convincing  active inflammation. Transverse colon is within normal limits. There is a fat containing ventral abdominal hernia in the midline, supraumbilical, containing mesenteric fat (about 5 cm series 2, image 52, which is probably transverse mesocolon. But no herniated bowel. No regional inflammation or features of incarceration. This hernia is chronic and does not appear significantly changed since 2023. Decompressed hepatic flexure. Redundant right colon with retained stool but otherwise normal. Normal appendix on series 2, image 52. Nondilated small bowel throughout the abdomen. Small gastric hiatal hernia versus phrenic ampulla. Some retained fluid and food in the stomach. Decompressed duodenum. No pneumoperitoneum. No free fluid. No mesenteric inflammation identified. Vascular/Lymphatic: Major arterial structures in the abdomen and pelvis appear patent and normal aside from mild for age calcified iliac artery atherosclerosis. Portal venous system appears to be patent. No lymphadenopathy identified. Reproductive: Chronically abnormal uterus. Diminutive or absent ovaries. Other: No pelvis free fluid. Musculoskeletal: Widespread hyperostosis related spinal ankylosis from the lower thoracic into the upper lumbar. Degenerative appearing lower lumbar and lumbosacral junction ankylosis primarily via the facets. No acute or suspicious osseous lesion identified. IMPRESSION: 1. Cirrhotic appearance of the Liver now and new mild splenomegaly since 2023. Small caliber upper abdominal varices now suspected. No ascites. 2. No superimposed acute or inflammatory process identified in the abdomen or pelvis;  Diverticulosis of the descending and sigmoid colon with no active inflammation identified. Chronic fat containing ventral abdominal hernia without evidence of incarceration. Chronic left renal cortical atrophy. 3. Calcified coronary artery atherosclerosis. No significant abdominal aortic atherosclerosis. Electronically Signed   By: Marlise Simpers M.D.   On: 12/20/2023 06:58     PROCEDURES:  Critical Care performed: No  .1-3 Lead EKG Interpretation  Performed by: Norlene Beavers, MD Authorized by: Norlene Beavers, MD     Interpretation: normal     ECG rate:  85   ECG rate assessment: normal     Rhythm: sinus rhythm     Ectopy: none     Conduction: normal   Comments:     Patient placed on cardiac monitor to evaluate for arrhythmias    MEDICATIONS ORDERED IN ED: Medications  cefTRIAXone  (ROCEPHIN ) 1 g in sodium chloride  0.9 % 100 mL IVPB (has no administration in time range)  sodium chloride  0.9 % bolus 1,000 mL (1,000 mLs Intravenous New Bag/Given 12/20/23 0550)  ondansetron  (ZOFRAN ) injection 4 mg (4 mg Intravenous Given 12/20/23 0545)  fentaNYL  (SUBLIMAZE ) injection 50 mcg (50 mcg Intravenous Given 12/20/23 0546)  iohexol  (OMNIPAQUE ) 300 MG/ML solution 100 mL (100 mLs Intravenous Contrast Given 12/20/23 0557)     IMPRESSION / MDM / ASSESSMENT AND PLAN / ED COURSE  I reviewed the triage vital signs and the nursing notes.                             63 year old female presenting with central abdominal pain.Differential diagnosis includes, but is not limited to, ovarian cyst, ovarian torsion, acute appendicitis, diverticulitis, urinary tract infection/pyelonephritis, endometriosis, bowel obstruction, colitis, renal colic, gastroenteritis, hernia, etc. I personally reviewed patient's records and note a podiatry office visit from 12/07/2023 for onychomycosis and diabetic foot exam.  Patient's presentation is most consistent with acute complicated illness / injury requiring diagnostic workup.  The  patient is on the cardiac monitor to evaluate for evidence of arrhythmia and/or significant heart rate changes.  Laboratory results demonstrate mild hyperglycemia without elevation of anion gap, mild elevation of total bilirubin, minimal elevation of lipase, minimal  elevation of white count.  Will administer IV fluids, fentanyl  for pain, Zofran  for nausea.  Proceed with CT abdomen/pelvis.  Will reassess.  Clinical Course as of 12/20/23 0706  Sun Dec 20, 2023  0704 CT negative for acute process.  Will discharge home on Keflex, as needed Lactulose/Bentyl/Zofran  and patient will follow-up closely with her PCP.  Strict return precautions given.  Patient verbalizes understanding and agrees with plan of care. [JS]    Clinical Course User Index [JS] Norlene Beavers, MD     FINAL CLINICAL IMPRESSION(S) / ED DIAGNOSES   Final diagnoses:  Periumbilical abdominal pain  Lower urinary tract infectious disease  Constipation, unspecified constipation type     Rx / DC Orders   ED Discharge Orders          Ordered    cephALEXin (KEFLEX) 500 MG capsule  3 times daily        12/20/23 0658    dicyclomine (BENTYL) 20 MG tablet  Every 6 hours        12/20/23 0658    ondansetron  (ZOFRAN -ODT) 4 MG disintegrating tablet  Every 8 hours PRN        12/20/23 0658    lactulose (CHRONULAC) 10 GM/15ML solution  Daily PRN        12/20/23 1610             Note:  This document was prepared using Dragon voice recognition software and may include unintentional dictation errors.   Drayton Tieu J, MD 12/20/23 9604    Tilley Faeth J, MD 12/20/23 [phone]

## 2023-12-20 NOTE — ED Notes (Signed)
 Dr. Vallery Gavel was informed of high cbg and heart rate of 175. Dr. Vallery Gavel was given new EKG

## 2023-12-20 NOTE — ED Triage Notes (Signed)
 Pt to ED via POV c/o abd pain that started around 9pm and has been getting progressively worse. Reports making herself vomit to see if that would make her feel better. Denies CP, SOB, fevers. Last BM yesterday

## 2023-12-27 ENCOUNTER — Other Ambulatory Visit: Payer: Self-pay | Admitting: Internal Medicine

## 2023-12-27 ENCOUNTER — Other Ambulatory Visit: Payer: Self-pay | Admitting: Family Medicine

## 2023-12-29 ENCOUNTER — Ambulatory Visit: Admitting: Family Medicine

## 2023-12-29 ENCOUNTER — Encounter: Payer: Self-pay | Admitting: Family Medicine

## 2023-12-29 VITALS — BP 110/64 | HR 76 | Temp 98.0°F | Ht 61.0 in | Wt 168.0 lb

## 2023-12-29 DIAGNOSIS — R161 Splenomegaly, not elsewhere classified: Secondary | ICD-10-CM

## 2023-12-29 DIAGNOSIS — E1169 Type 2 diabetes mellitus with other specified complication: Secondary | ICD-10-CM

## 2023-12-29 DIAGNOSIS — Z7984 Long term (current) use of oral hypoglycemic drugs: Secondary | ICD-10-CM | POA: Diagnosis not present

## 2023-12-29 DIAGNOSIS — E785 Hyperlipidemia, unspecified: Secondary | ICD-10-CM

## 2023-12-29 DIAGNOSIS — K5903 Drug induced constipation: Secondary | ICD-10-CM | POA: Diagnosis not present

## 2023-12-29 DIAGNOSIS — R932 Abnormal findings on diagnostic imaging of liver and biliary tract: Secondary | ICD-10-CM

## 2023-12-29 DIAGNOSIS — I10 Essential (primary) hypertension: Secondary | ICD-10-CM

## 2023-12-29 DIAGNOSIS — E119 Type 2 diabetes mellitus without complications: Secondary | ICD-10-CM

## 2023-12-29 DIAGNOSIS — K5900 Constipation, unspecified: Secondary | ICD-10-CM | POA: Insufficient documentation

## 2023-12-29 LAB — POCT GLYCOSYLATED HEMOGLOBIN (HGB A1C): Hemoglobin A1C: 7.2 % — AB (ref 4.0–5.6)

## 2023-12-29 NOTE — Assessment & Plan Note (Signed)
 Lab today  Disc goals for lipids and reasons to control them Rev last labs with pt Rev low sat fat diet in detail  Atorvastatin  40 mg daily  Diet is improving  Last LDL under 70

## 2023-12-29 NOTE — Progress Notes (Signed)
 Subjective:    Patient ID: Taylor Grimes, female    DOB: February 04, 1961, 63 y.o.   MRN: [id number]  HPI  Wt Readings from Last 3 Encounters:  12/29/23 168 lb (76.2 kg)  12/20/23 165 lb (74.8 kg)  09/28/23 177 lb 2 oz (80.3 kg)   31.74 kg/m  Vitals:   12/29/23 1020  BP: 110/64  Pulse: 76  Temp: 98 F (36.7 C)  SpO2: 99%   Pt presents for follow up of DM2 , HTN, lipids and chronic medical problems   Had a recent uti -seen in ER  Also constipated  Took keflex    CT ABDOMEN PELVIS W CONTRAST Result Date: 12/20/2023 CLINICAL DATA:  63 year old female with progressive abdominal pain since 20/1 100 hours. EXAM: CT ABDOMEN AND PELVIS WITH CONTRAST TECHNIQUE: Multidetector CT imaging of the abdomen and pelvis was performed using the standard protocol following bolus administration of intravenous contrast. RADIATION DOSE REDUCTION: This exam was performed according to the departmental dose-optimization program which includes automated exposure control, adjustment of the mA and/or kV according to patient size and/or use of iterative reconstruction technique. CONTRAST:  OMNIPAQUE  IOHEXOL  300 MG/ML  SOLN COMPARISON:  CT Abdomen and Pelvis 08/16/2021. FINDINGS: Lower chest: Evidence of calcified coronary artery atherosclerosis on series 2, image 4, but otherwise negative lower chest, lung bases are clear. Normal heart size. Hepatobiliary: Nodular liver (series 2, image 29). No discrete liver lesion. Gallbladder is larger compared to 2023 but there is no convincing pericholecystic inflammation. No cholelithiasis by CT. No bile duct obstruction. Pancreas: Negative. Spleen: Mild splenic enlargement since 2023, borderline to mild splenomegaly now (14 cm from the Splenic length series 2, image 30). No discrete splenic lesion. No perisplenic fluid. However, there are small caliber perisplenic varices now suspected (coronal image 44). Adrenals/Urinary Tract: Negative adrenal glands. Chronic left renal  cortical atrophy appears stable. Nonobstructed kidneys. Symmetric renal enhancement and contrast excretion to diminutive ureters. No nephrolithiasis. Unremarkable bladder. Incidental pelvic phleboliths. Stomach/Bowel: Mild retained stool in the rectum. Negative distal sigmoid. Diverticulosis throughout the descending and proximal sigmoid colon. But no convincing active inflammation. Transverse colon is within normal limits. There is a fat containing ventral abdominal hernia in the midline, supraumbilical, containing mesenteric fat (about 5 cm series 2, image 52, which is probably transverse mesocolon. But no herniated bowel. No regional inflammation or features of incarceration. This hernia is chronic and does not appear significantly changed since 2023. Decompressed hepatic flexure. Redundant right colon with retained stool but otherwise normal. Normal appendix on series 2, image 52. Nondilated small bowel throughout the abdomen. Small gastric hiatal hernia versus phrenic ampulla. Some retained fluid and food in the stomach. Decompressed duodenum. No pneumoperitoneum. No free fluid. No mesenteric inflammation identified. Vascular/Lymphatic: Major arterial structures in the abdomen and pelvis appear patent and normal aside from mild for age calcified iliac artery atherosclerosis. Portal venous system appears to be patent. No lymphadenopathy identified. Reproductive: Chronically abnormal uterus. Diminutive or absent ovaries. Other: No pelvis free fluid. Musculoskeletal: Widespread hyperostosis related spinal ankylosis from the lower thoracic into the upper lumbar. Degenerative appearing lower lumbar and lumbosacral junction ankylosis primarily via the facets. No acute or suspicious osseous lesion identified. IMPRESSION: 1. Cirrhotic appearance of the Liver now and new mild splenomegaly since 2023. Small caliber upper abdominal varices now suspected. No ascites. 2. No superimposed acute or inflammatory process  identified in the abdomen or pelvis; Diverticulosis of the descending and sigmoid colon with no active inflammation identified. Chronic fat containing ventral  abdominal hernia without evidence of incarceration. Chronic left renal cortical atrophy. 3. Calcified coronary artery atherosclerosis. No significant abdominal aortic atherosclerosis. Electronically Signed   By: Marlise Simpers M.D.   On: 12/20/2023 06:58   Bowels are moving better Constipation is better     HTN bp is stable today  No cp or palpitations or headaches or edema  No side effects to medicines  BP Readings from Last 3 Encounters:  12/29/23 110/64  12/20/23 (!) 145/86  09/28/23 130/80   Losartan  50 mg daily   Lab Results  Component Value Date   NA 141 12/20/2023   K 4.3 12/20/2023   CO2 26 12/20/2023   GLUCOSE 249 (H) 12/20/2023   BUN 11 12/20/2023   CREATININE 0.79 12/20/2023   CALCIUM  9.6 12/20/2023   GFR 82.67 09/28/2023   GFRNONAA >60 12/20/2023   Lab Results  Component Value Date   ALT 21 12/20/2023   AST 36 12/20/2023   ALKPHOS 107 12/20/2023   BILITOT 1.4 (H) 12/20/2023    Lab Results  Component Value Date   WBC 11.9 (H) 12/20/2023   HGB 15.3 (H) 12/20/2023   HCT 44.4 12/20/2023   MCV 89.3 12/20/2023   PLT 169 12/20/2023   Lab Results  Component Value Date   LIPASE 62 (H) 12/20/2023     DM2   DM diet -good /working on it with her son and they are both losing significant weight   Exercise -walks with weights Symptoms-none    Lab Results  Component Value Date   HGBA1C 7.2 (A) 12/29/2023   HGBA1C 8.0 (A) 09/28/2023   HGBA1C 6.9 (A) 06/29/2023   Lab Results  Component Value Date   MICROALBUR <0.7 03/25/2023   MICROALBUR 2.6 (H) 03/17/2022   Improved  Metformin  1000 mg bid Trulicity  1.5 mg weekly -has lost significant weight - is thrilled with this and really working on her diet   Jardiance  10 mg daily  Last visit discussed plan for better diet and exercise   Takes arb and statin   Sees pharmacist also   No problems with medications  Renal protection Last eye exam   Walks with weights     Hyperlipidemia Lab Results  Component Value Date   CHOL 143 09/28/2023   HDL 42.40 09/28/2023   LDLCALC 64 09/28/2023   LDLDIRECT 111.0 08/19/2019   TRIG 184.0 (H) 09/28/2023   CHOLHDL 3 09/28/2023   Atorvastatin  40 mg daily   Occational hand tremor when writing Lab Results  Component Value Date   TSH 0.44 09/28/2023   Levothyroxine  100 mcg   Patient Active Problem List   Diagnosis Date Noted   Constipation 12/29/2023   Abnormal CT of liver 12/29/2023   Splenomegaly 12/29/2023   Encounter for hepatitis C screening test for low risk patient 09/28/2023   Encounter for screening for HIV 09/28/2023   Long-term current use of injectable noninsulin antidiabetic medication 03/25/2023   Complex sclerosing lesion of right breast 01/15/2023   History of colonic polyps    Polyp of cecum    Yeast dermatitis 10/15/2021   Encounter for screening mammogram for breast cancer 10/04/2021   Hyperlipidemia associated with type 2 diabetes mellitus (HCC) 08/17/2018   Hypertrophic toenail 02/02/2018   Anxiety disorder 09/12/2015   Low HDL (under 40) 10/24/2014   Routine general medical examination at a health care facility 03/08/2014   History of endometrial cancer 12/15/2012   Colon cancer screening 11/01/2012   DM type 2 (diabetes mellitus, type 2) (HCC)  06/30/2012   Varicose veins of leg with swelling, left 11/18/2011   Class 2 obesity due to excess calories with body mass index (BMI) of 35.0 to 35.9 in adult 11/12/2010   Essential hypertension 07/12/2009   Hypothyroidism (acquired) 05/16/2009   Tremor 04/10/2009   Past Medical History:  Diagnosis Date   Anxiety    Breast lesion    benign   Diabetes mellitus without complication (HCC)    Elevated transaminase level    ? fatty liver    History of endometrial cancer 12/15/2012   FIGO Grade 2 endometrioid  adenocarcinoma with squamous differentiation involving the mucosa only.  From [employer] Guidelines:  Recommended surveillance after treatment of endometrial cancer includes a follow-up visit every 3-6 months for 2 years, then every 6 months for 3 years, and annually thereafter. Each follow-up visit should include a thorough patient history; elicitation and investigation of any new symptoms associated with recurrence, such as vaginal bleeding, pelvic pain, weight loss, or lethargy; and a thorough speculum, pelvic, and rectovaginal examination   HTN (hypertension)    Hypothyroid    UTI (urinary tract infection)    Past Surgical History:  Procedure Laterality Date   ABDOMINAL HYSTERECTOMY     BREAST BIOPSY     benign cyst 2011   BREAST BIOPSY Right 01/06/2023   US  RT BREAST BX W LOC DEV 1ST LESION IMG BX SPEC US  GUIDE 01/06/2023 [facility]   CATARACT EXTRACTION W/PHACO Right 01/07/2022   Procedure: CATARACT EXTRACTION PHACO AND INTRAOCULAR LENS PLACEMENT (IOC) RIGHT DIABETIC 8.04 01:01.7;  Surgeon: Clair Crews, MD;  Location: [facility];  Service: Ophthalmology;  Laterality: Right;  Diabetic   CATARACT EXTRACTION W/PHACO Left 10/07/2022   Procedure: CATARACT EXTRACTION PHACO AND INTRAOCULAR LENS PLACEMENT (IOC) LEFT DIABETIC 4.93 00:40.7;  Surgeon: Clair Crews, MD;  Location: [facility];  Service: Ophthalmology;  Laterality: Left;  Diabetic   CESAREAN SECTION     COLONOSCOPY WITH PROPOFOL  N/A 11/20/2021   Procedure: COLONOSCOPY WITH PROPOFOL ;  Surgeon: Selena Daily, MD;  Location: [facility];  Service: Gastroenterology;  Laterality: N/A;   INCISION AND DRAINAGE ABSCESS Right 08/16/2021   Procedure: INCISION AND DRAINAGE ABDOMINAL WALL ABSCESS;  Surgeon: Flynn Hylan, MD;  Location: [facility];  Service: General;  Laterality: Right;   LAPAROSCOPIC TOTAL HYSTERECTOMY  11/2012   Social History   Tobacco Use   Smoking status: Former    Current packs/day: 0.00     Types: Cigarettes    Quit date: 07/22/1979    Years since quitting: 44.4    Passive exposure: Never   Smokeless tobacco: Never   Tobacco comments:    briefly smoked at 16.  (May occasionally have 1 cig)  Vaping Use   Vaping status: Never Used  Substance Use Topics   Alcohol use: Not Currently    Comment: rarely   Drug use: No   Family History  Problem Relation Age of Onset   Hypothyroidism Mother    Alzheimer's disease Mother    Stroke Father    Deep vein thrombosis Sister    Colon cancer Neg Hx    Esophageal cancer Neg Hx    Stomach cancer Neg Hx    Rectal cancer Neg Hx    Breast cancer Neg Hx    No Known Allergies Current Outpatient Medications on File Prior to Visit  Medication Sig Dispense Refill   atorvastatin  (LIPITOR) 40 MG tablet Take 1 tablet (40 mg total) by mouth daily. 90 tablet 3   clotrimazole  (  LOTRIMIN ) 1 % cream APPLY 1 APPLICATION. TOPICALLY 2 (TWO) TIMES DAILY. 30 g 0   dicyclomine  (BENTYL ) 20 MG tablet Take 1 tablet (20 mg total) by mouth every 6 (six) hours. 20 tablet 0   Dulaglutide  (TRULICITY ) 1.5 MG/0.5ML SOAJ INJECT 1.5 MG SUBCUTANEOUSLY ONCE A WEEK 2 mL 2   empagliflozin  (JARDIANCE ) 10 MG TABS tablet Take 1 tablet (10 mg total) by mouth daily. 90 tablet 2   losartan  (COZAAR ) 100 MG tablet TAKE 1/2 TABLET BY MOUTH DAILY 15 tablet 0   metFORMIN  (GLUCOPHAGE ) 1000 MG tablet TAKE 1 TABLET (1,000 MG TOTAL) BY MOUTH TWICE A DAY WITH FOOD 60 tablet 5   levothyroxine  (SYNTHROID ) 100 MCG tablet TAKE 1 TABLET BY MOUTH EVERY DAY 30 tablet 5   No current facility-administered medications on file prior to visit.    Review of Systems  Constitutional:  Negative for activity change, appetite change, fatigue, fever and unexpected weight change.  HENT:  Negative for congestion, ear pain, rhinorrhea, sinus pressure and sore throat.   Eyes:  Negative for pain, redness and visual disturbance.  Respiratory:  Negative for cough, shortness of breath and wheezing.    Cardiovascular:  Negative for chest pain and palpitations.  Gastrointestinal:  Negative for abdominal pain, blood in stool, constipation and diarrhea.       Abd pain is resolved   Endocrine: Negative for polydipsia and polyuria.  Genitourinary:  Negative for dysuria, flank pain, frequency, pelvic pain and urgency.  Musculoskeletal:  Negative for arthralgias, back pain and myalgias.  Skin:  Negative for pallor and rash.  Allergic/Immunologic: Negative for environmental allergies.  Neurological:  Negative for dizziness, syncope and headaches.  Hematological:  Negative for adenopathy. Does not bruise/bleed easily.  Psychiatric/Behavioral:  Negative for decreased concentration and dysphoric mood. The patient is not nervous/anxious.        Objective:   Physical Exam Constitutional:      General: She is not in acute distress.    Appearance: Normal appearance. She is well-developed. She is obese. She is not ill-appearing or diaphoretic.  HENT:     Head: Normocephalic and atraumatic.  Eyes:     Conjunctiva/sclera: Conjunctivae normal.     Pupils: Pupils are equal, round, and reactive to light.  Neck:     Thyroid : No thyromegaly.     Vascular: No carotid bruit or JVD.  Cardiovascular:     Rate and Rhythm: Normal rate and regular rhythm.     Heart sounds: Normal heart sounds.     No gallop.  Pulmonary:     Effort: Pulmonary effort is normal. No respiratory distress.     Breath sounds: Normal breath sounds. No wheezing or rales.  Abdominal:     General: There is no distension or abdominal bruit.     Palpations: Abdomen is soft. There is no mass.     Tenderness: There is no abdominal tenderness. There is no guarding or rebound.     Hernia: A hernia is present.     Comments: Ventral/incisional hernia  Reducible  Non tender   Musculoskeletal:     Cervical back: Normal range of motion and neck supple.     Right lower leg: No edema.     Left lower leg: No edema.  Lymphadenopathy:      Cervical: No cervical adenopathy.  Skin:    General: Skin is warm and dry.     Coloration: Skin is not pale.     Findings: No rash.  Neurological:  Mental Status: She is alert.     Coordination: Coordination normal.     Deep Tendon Reflexes: Reflexes are normal and symmetric. Reflexes normal.  Psychiatric:        Mood and Affect: Mood normal.           Assessment & Plan:   Problem List Items Addressed This Visit       Cardiovascular and Mediastinum   Essential hypertension   bp in fair control at this time  BP Readings from Last 1 Encounters:  12/29/23 110/64   No changes needed Most recent labs reviewed  Disc lifstyle change with low sodium diet and exercise  Plan to continue losartan  50 mg daily           Endocrine   Hyperlipidemia associated with type 2 diabetes mellitus (HCC)   Lab today  Disc goals for lipids and reasons to control them Rev last labs with pt Rev low sat fat diet in detail  Atorvastatin  40 mg daily  Diet is improving  Last LDL under 70      DM type 2 (diabetes mellitus, type 2) (HCC) - Primary   Lab Results  Component Value Date   HGBA1C 7.2 (A) 12/29/2023   HGBA1C 8.0 (A) 09/28/2023   HGBA1C 6.9 (A) 06/29/2023   Improved but still not at goal Working hard at diet/exercise and weight loss Metformin  1000 mg bid Trulicity  1.5 mg weekly -losing weight Jardiance  10 mg daily   On arb and statin  Has seen pharmacist  Eye exam utd Good foot care   Follow up 3 mo  Hope for further improvement       Relevant Orders   POCT HgB A1C (Completed)     Other   Splenomegaly   Incidental in CT with ? Cirrhosis  US  of abd ordered  No symptoms        Relevant Orders   US  Abdomen Complete   Constipation   Worsened likely by trulicity    Encouraged use of miralax on a schedule       Abnormal CT of liver   Liver appeared cirrhotic  Also small caliber upper abd varices /no ascites Mild splenomegally  LFTs normal with bili  tot of 1.4  Lipase mildly elevated , pancreas looked normal on CT  No longer has abd pain (thought to be from uti)  Reviewed hospital records, lab results and studies in detail    Will order us  abd for this and splenomegally       Relevant Orders   US  Abdomen Complete

## 2023-12-29 NOTE — Assessment & Plan Note (Addendum)
 Liver appeared cirrhotic  Also small caliber upper abd varices /no ascites Mild splenomegally  LFTs normal with bili tot of 1.4  Lipase mildly elevated , pancreas looked normal on CT  No longer has abd pain (thought to be from uti)  Reviewed hospital records, lab results and studies in detail    Will order us  abd for this and splenomegally

## 2023-12-29 NOTE — Assessment & Plan Note (Signed)
 bp in fair control at this time  BP Readings from Last 1 Encounters:  12/29/23 110/64   No changes needed Most recent labs reviewed  Disc lifstyle change with low sodium diet and exercise  Plan to continue losartan  50 mg daily

## 2023-12-29 NOTE — Assessment & Plan Note (Signed)
 Worsened likely by trulicity    Encouraged use of miralax on a schedule

## 2023-12-29 NOTE — Assessment & Plan Note (Signed)
 Lab Results  Component Value Date   HGBA1C 7.2 (A) 12/29/2023   HGBA1C 8.0 (A) 09/28/2023   HGBA1C 6.9 (A) 06/29/2023   Improved but still not at goal Working hard at diet/exercise and weight loss Metformin  1000 mg bid Trulicity  1.5 mg weekly -losing weight Jardiance  10 mg daily   On arb and statin  Has seen pharmacist  Eye exam utd Good foot care   Follow up 3 mo  Hope for further improvement

## 2023-12-29 NOTE — Assessment & Plan Note (Signed)
 Incidental in CT with ? Cirrhosis  US  of abd ordered  No symptoms

## 2023-12-29 NOTE — Patient Instructions (Addendum)
 I am going to work on an ultrasound order for abdomen to look at your liver   If you get constipated- some miralax (start with one dose per day) is very safe  Get on a schedule with it - it takes 3-4 days to work so stick with it   Keep working on healthy diet and exercise    I put the referral in for ultrasound of abdomen to get a better picture of liver and spleen  Will aim for a place in [place]  Please let us  know if you don't hear in 1-2 weeks to set that up  If abdominal pain returns let us  know    See you in 3 months

## 2023-12-29 NOTE — Assessment & Plan Note (Signed)
 Taylor Grimes

## 2023-12-30 ENCOUNTER — Encounter: Payer: Self-pay | Admitting: *Deleted

## 2024-01-20 ENCOUNTER — Ambulatory Visit
Admission: RE | Admit: 2024-01-20 | Discharge: 2024-01-20 | Disposition: A | Discharge status: Home / Self Care | Source: Ambulatory Visit | Attending: Family Medicine | Admitting: Family Medicine

## 2024-01-20 DIAGNOSIS — R932 Abnormal findings on diagnostic imaging of liver and biliary tract: Secondary | ICD-10-CM

## 2024-01-20 DIAGNOSIS — R161 Splenomegaly, not elsewhere classified: Secondary | ICD-10-CM | POA: Diagnosis not present

## 2024-01-20 DIAGNOSIS — K7460 Unspecified cirrhosis of liver: Secondary | ICD-10-CM | POA: Diagnosis not present

## 2024-01-20 DIAGNOSIS — K828 Other specified diseases of gallbladder: Secondary | ICD-10-CM | POA: Diagnosis not present

## 2024-01-21 DIAGNOSIS — H902 Conductive hearing loss, unspecified: Secondary | ICD-10-CM | POA: Diagnosis not present

## 2024-01-21 DIAGNOSIS — H6123 Impacted cerumen, bilateral: Secondary | ICD-10-CM | POA: Diagnosis not present

## 2024-01-22 ENCOUNTER — Other Ambulatory Visit: Payer: Self-pay | Admitting: Family Medicine

## 2024-01-23 ENCOUNTER — Ambulatory Visit: Payer: Self-pay | Admitting: Family Medicine

## 2024-01-23 DIAGNOSIS — R161 Splenomegaly, not elsewhere classified: Secondary | ICD-10-CM

## 2024-01-23 DIAGNOSIS — R932 Abnormal findings on diagnostic imaging of liver and biliary tract: Secondary | ICD-10-CM

## 2024-01-25 NOTE — Telephone Encounter (Signed)
 I put the referral in for  Please let us  know if you don't hear in 1-2 weeks to set that up

## 2024-01-26 ENCOUNTER — Other Ambulatory Visit: Payer: Self-pay | Admitting: Family Medicine

## 2024-01-26 DIAGNOSIS — E119 Type 2 diabetes mellitus without complications: Secondary | ICD-10-CM

## 2024-01-26 NOTE — Telephone Encounter (Signed)
 Last filled on 11/09/23 #2 mL/ 2 refills   F/u scheduled on 03/30/24

## 2024-01-28 ENCOUNTER — Encounter: Payer: Self-pay | Admitting: *Deleted

## 2024-02-04 ENCOUNTER — Ambulatory Visit
Admission: RE | Admit: 2024-02-04 | Discharge: 2024-02-04 | Disposition: A | Discharge status: Home / Self Care | Payer: Medicaid Other | Source: Ambulatory Visit | Attending: Surgery | Admitting: Surgery

## 2024-02-04 ENCOUNTER — Inpatient Hospital Stay
Admission: RE | Admit: 2024-02-04 | Discharge: 2024-02-04 | Discharge status: Home / Self Care | Payer: Medicaid Other | Source: Ambulatory Visit | Attending: Surgery

## 2024-02-04 DIAGNOSIS — N6459 Other signs and symptoms in breast: Secondary | ICD-10-CM | POA: Diagnosis not present

## 2024-02-04 DIAGNOSIS — R928 Other abnormal and inconclusive findings on diagnostic imaging of breast: Secondary | ICD-10-CM | POA: Diagnosis not present

## 2024-02-04 DIAGNOSIS — R92323 Mammographic fibroglandular density, bilateral breasts: Secondary | ICD-10-CM | POA: Diagnosis not present

## 2024-02-09 DIAGNOSIS — K7460 Unspecified cirrhosis of liver: Secondary | ICD-10-CM | POA: Diagnosis not present

## 2024-02-09 DIAGNOSIS — Z1159 Encounter for screening for other viral diseases: Secondary | ICD-10-CM | POA: Diagnosis not present

## 2024-02-10 NOTE — Progress Notes (Unsigned)
 Patient ID: Taylor Grimes, female   DOB: [birth year], 63 y.o.   MRN: [id number]  Chief Complaint: Complex sclerosing lesion right breast  History of Present Illness Taylor Grimes is a 63 y.o. female with an abnormality which was identified on screening imaging.  She underwent ultrasound-guided spring-loaded core biopsy.  Biopsy pathology noted below.  She denies any family history of breast cancer.  She has preferred to avoid surgery, and has a follow-up 22-month imaging which she presents with today.  She denies any breast issues.  Past Medical History Past Medical History:  Diagnosis Date   Anxiety    Breast lesion    benign   Diabetes mellitus without complication (HCC)    Elevated transaminase level    ? fatty liver    History of endometrial cancer 12/15/2012   FIGO Grade 2 endometrioid adenocarcinoma with squamous differentiation involving the mucosa only.  From SGO Guidelines:  Recommended surveillance after treatment of endometrial cancer includes a follow-up visit every 3-6 months for 2 years, then every 6 months for 3 years, and annually thereafter. Each follow-up visit should include a thorough patient history; elicitation and investigation of any new symptoms associated with recurrence, such as vaginal bleeding, pelvic pain, weight loss, or lethargy; and a thorough speculum, pelvic, and rectovaginal examination   HTN (hypertension)    Hypothyroid    UTI (urinary tract infection)       Past Surgical History:  Procedure Laterality Date   ABDOMINAL HYSTERECTOMY     BREAST BIOPSY     benign cyst 2011   BREAST BIOPSY Right 01/06/2023   US  RT BREAST BX W LOC DEV 1ST LESION IMG BX SPEC US  GUIDE 01/06/2023 [facility]   CATARACT EXTRACTION W/PHACO Right 01/07/2022   Procedure: CATARACT EXTRACTION PHACO AND INTRAOCULAR LENS PLACEMENT (IOC) RIGHT DIABETIC 8.04 01:01.7;  Surgeon: Jaye Fallow, MD;  Location: [facility];  Service: Ophthalmology;  Laterality: Right;   Diabetic   CATARACT EXTRACTION W/PHACO Left 10/07/2022   Procedure: CATARACT EXTRACTION PHACO AND INTRAOCULAR LENS PLACEMENT (IOC) LEFT DIABETIC 4.93 00:40.7;  Surgeon: Jaye Fallow, MD;  Location: [facility];  Service: Ophthalmology;  Laterality: Left;  Diabetic   CESAREAN SECTION     COLONOSCOPY WITH PROPOFOL  N/A 11/20/2021   Procedure: COLONOSCOPY WITH PROPOFOL ;  Surgeon: Unk Corinn Skiff, MD;  Location: [facility];  Service: Gastroenterology;  Laterality: N/A;   INCISION AND DRAINAGE ABSCESS Right 08/16/2021   Procedure: INCISION AND DRAINAGE ABDOMINAL WALL ABSCESS;  Surgeon: Lane Shope, MD;  Location: [facility];  Service: General;  Laterality: Right;   LAPAROSCOPIC TOTAL HYSTERECTOMY  11/2012    No Known Allergies  Current Outpatient Medications  Medication Sig Dispense Refill   atorvastatin  (LIPITOR) 40 MG tablet Take 1 tablet (40 mg total) by mouth daily. 90 tablet 3   clotrimazole  (LOTRIMIN ) 1 % cream APPLY 1 APPLICATION. TOPICALLY 2 (TWO) TIMES DAILY. 30 g 0   dicyclomine  (BENTYL ) 20 MG tablet Take 1 tablet (20 mg total) by mouth every 6 (six) hours. 20 tablet 0   Dulaglutide  (TRULICITY ) 1.5 MG/0.5ML SOAJ INJECT 1.5 MG SUBCUTANEOUSLY ONCE A WEEK 2 mL 3   empagliflozin  (JARDIANCE ) 10 MG TABS tablet Take 1 tablet (10 mg total) by mouth daily. 90 tablet 2   levothyroxine  (SYNTHROID ) 100 MCG tablet TAKE 1 TABLET BY MOUTH EVERY DAY 30 tablet 5   losartan  (COZAAR ) 100 MG tablet TAKE 1/2 TABLET BY MOUTH DAILY 45 tablet 1   metFORMIN  (GLUCOPHAGE ) 1000 MG tablet TAKE 1  TABLET (1,000 MG TOTAL) BY MOUTH TWICE A DAY WITH FOOD 60 tablet 5   No current facility-administered medications for this visit.    Family History Family History  Problem Relation Age of Onset   Hypothyroidism Mother    Alzheimer's disease Mother    Stroke Father    Deep vein thrombosis Sister    Colon cancer Neg Hx    Esophageal cancer Neg Hx    Stomach cancer Neg Hx    Rectal cancer Neg Hx     Breast cancer Neg Hx       Social History Social History   Tobacco Use   Smoking status: Former    Current packs/day: 0.00    Types: Cigarettes    Quit date: 07/22/1979    Years since quitting: 44.5    Passive exposure: Never   Smokeless tobacco: Never   Tobacco comments:    briefly smoked at 16.  (May occasionally have 1 cig)  Vaping Use   Vaping status: Never Used  Substance Use Topics   Alcohol use: Not Currently    Comment: rarely   Drug use: No        Review of Systems  All other systems reviewed and are negative.    Physical Exam Last menstrual period 07/21/2008.     CONSTITUTIONAL: Well developed, and nourished, appropriately responsive and aware without distress.   EYES: Sclera non-icteric.   EARS, NOSE, MOUTH AND THROAT:  The oropharynx is clear. Oral mucosa is pink and moist.     Hearing is intact to voice.  NECK: Trachea is midline, and there is no jugular venous distension.  LYMPH NODES:  Lymph nodes in the neck are not appreciated. RESPIRATORY:  Normal respiratory effort without pathologic use of accessory muscles. CARDIOVASCULAR:  Well perfused.  GI: The abdomen is  soft, nontender, and nondistended. There were no palpable masses.  GU: Jon present as chaperone.  There is no appreciable palpable abnormality in either breast.  No dominant nodularity, nor suspicious skin changes present. MUSCULOSKELETAL:  Symmetrical muscle tone appreciated in all four extremities.    SKIN: Skin turgor is normal. No pathologic skin lesions appreciated.  NEUROLOGIC:  Motor and sensation appear grossly normal.  Cranial nerves are grossly without defect. PSYCH:  Alert and oriented to person, place and time. Affect is appropriate for situation.  Data Reviewed I have personally reviewed what is currently available of the patient's imaging, recent labs and medical records.   Labs:     Latest Ref Rng & Units 12/20/2023    1:38 AM 09/15/2022    8:02 AM 09/06/2021    7:34  AM  CBC  WBC 4.0 - 10.5 K/uL 11.9  7.9  4.5   Hemoglobin 12.0 - 15.0 g/dL 84.6  86.4  87.4   Hematocrit 36.0 - 46.0 % 44.4  39.7  37.3   Platelets 150 - 400 K/uL 169  151.0  159.0       Latest Ref Rng & Units 12/20/2023    1:38 AM 09/28/2023   10:14 AM 03/18/2023    7:39 AM  CMP  Glucose 70 - 99 mg/dL 750  841  866   BUN 8 - 23 mg/dL 11  9  11    Creatinine 0.44 - 1.00 mg/dL 9.20  9.23  9.12   Sodium 135 - 145 mmol/L 141  141  141   Potassium 3.5 - 5.1 mmol/L 4.3  4.3  3.8   Chloride 98 - 111 mmol/L 103  103  106   CO2 22 - 32 mmol/L 26  27  26    Calcium  8.9 - 10.3 mg/dL 9.6  9.4  9.7   Total Protein 6.5 - 8.1 g/dL 7.8  7.0  7.0   Total Bilirubin 0.0 - 1.2 mg/dL 1.4  1.2  1.0   Alkaline Phos 38 - 126 U/L 107  112  89   AST 15 - 41 U/L 36  30  34   ALT 0 - 44 U/L 21  22  26        Imaging: Radiological images reviewed:  CLINICAL DATA:  63 year old female for 12 month follow-up of RIGHT breast complex sclerosing lesion biopsied on 01/06/2023. Imaging follow-up instead of surgery is being pursued.   EXAM: DIGITAL DIAGNOSTIC BILATERAL MAMMOGRAM WITH TOMOSYNTHESIS AND CAD; ULTRASOUND RIGHT BREAST LIMITED   TECHNIQUE: Bilateral digital diagnostic mammography and breast tomosynthesis was performed. The images were evaluated with computer-aided detection. ; Targeted ultrasound examination of the right breast was performed   COMPARISON:  Previous exam(s).   ACR Breast Density Category b: There are scattered areas of fibroglandular density.   FINDINGS: There is no mammographic evidence of malignancy in the LEFT breast.   Full field views of the RIGHT breast demonstrate ribbon biopsy clip within the anterior RIGHT breast, without new or suspicious mammographic changes regionally. Targeted ultrasound is performed at the 12 o'clock position 3 cm from the nipple, again showing an echogenic biopsy clip, with prominent surrounding ducks in decreased conspicuity of the adjacent  suspected intraductal mass, which is challenging to measure on today's examination but measures no more than 4 mm, previously 5-6 mm when measured in a similar fashion on comparable comparison images. There are no new or suspicious findings in the region.   IMPRESSION: No significant interval change in mammographic or sonographic appearance of biopsy-proven RIGHT breast complex sclerosing lesion, for which the patient has opted for surveillance. No mammographic evidence of malignancy in EITHER breast.   RECOMMENDATION: BILATERAL diagnostic mammogram and RIGHT breast ultrasound in 12 months, which will complete 2 years of surveillance.   I have discussed the findings and recommendations with the patient. If applicable, a reminder letter will be sent to the patient regarding the next appointment.   BI-RADS CATEGORY  2: Benign.     Electronically Signed   By: Norleen Croak M.D.   On: 02/04/2024 14:16  Assessment     Patient Active Problem List   Diagnosis Date Noted   Constipation 12/29/2023   Abnormal CT of liver 12/29/2023   Splenomegaly 12/29/2023   Encounter for hepatitis C screening test for low risk patient 09/28/2023   Encounter for screening for HIV 09/28/2023   Long-term current use of injectable noninsulin antidiabetic medication 03/25/2023   Complex sclerosing lesion of right breast 01/15/2023   History of colonic polyps    Polyp of cecum    Yeast dermatitis 10/15/2021   Encounter for screening mammogram for breast cancer 10/04/2021   Hyperlipidemia associated with type 2 diabetes mellitus (HCC) 08/17/2018   Hypertrophic toenail 02/02/2018   Anxiety disorder 09/12/2015   Low HDL (under 40) 10/24/2014   Routine general medical examination at a health care facility 03/08/2014   History of endometrial cancer 12/15/2012   Colon cancer screening 11/01/2012   DM type 2 (diabetes mellitus, type 2) (HCC) 06/30/2012   Varicose veins of leg with swelling, left  11/18/2011   Class 2 obesity due to excess calories with body mass index (BMI) of 35.0 to 35.9 in  adult 11/12/2010   Essential hypertension 07/12/2009   Hypothyroidism (acquired) 05/16/2009   Tremor 04/10/2009    Plan    Options of proceeding with excision, utilizing a tag to localize this area discussed in detail.  Other options of continued observation were discussed with the associated risks reviewing the data below. She would like to continue screening and observation, but willing to undergo biopsy should there be a change. Therefore we anticipate having her follow-up in 6 months with diagnostic imaging of the right breast.   Radial scars -- Complex Sclerosing Lesion (CSL) Radial scars, also called complex sclerosing lesions, are a pathologic diagnosis, usually discovered incidentally when a breast mass or radiologic abnormality is removed or biopsied. Occasionally, radial scars are large enough to be detected on mammography as suspicious spiculated masses, which cannot be reliably differentiated from spiculated carcinoma by imaging alone. Radial scars are characterized microscopically by a fibroelastic core with radiating ducts and lobules. In general, surgical excision is recommended when radial scars or complex sclerosing lesions are diagnosed on core biopsy, based on series showing that 8 to 17 percent of surgical specimens at subsequent excision are positive for malignancy. These high upgrade rates have led to the suggestion that these lesions may actually be premalignant lesions, progressing from scar to hyperplasia to carcinoma. This, however, is controversial, and more recent series suggest the presence of undiagnosed cancer is much lower, with upgrade rates in the range of 0.6 to 3.6 percent. The upgrade rate is even lower with large-volume (eg, vacuum-assisted) needle biopsy.   In a meta-analysis of over 3000 patients with radial scars, the upgrade rate after vacuum-assisted biopsy to  invasive and in situ cancer was only 0 and 1 percent, respectively. In a study of 50 patients with radial scar who were followed by active surveillance, no patient progressed on interval imaging at 16 months. The current recommendations from the American Society of Breast Surgeons state that most radial scars should be excised, although imaging follow-up is reasonable for small, image-detected radial scars that are completely removed or well-sampled with large-gauge devices and in the setting of imaging-pathology concordance.  No additional treatment beyond excision is needed for radial scars. The risk of subsequent breast cancer after excision in this population is small, and chemoprevention is not indicated.  Face-to-face time spent with the patient and accompanying care providers(if present) was 30 minutes, with more than 50% of the time spent counseling, educating, and coordinating care of the patient.    These notes generated with voice recognition software. I apologize for typographical errors.  Honor Leghorn M.D., FACS 02/10/2024, 3:11 PM

## 2024-02-11 ENCOUNTER — Encounter: Payer: Self-pay | Admitting: Surgery

## 2024-02-11 ENCOUNTER — Ambulatory Visit (INDEPENDENT_AMBULATORY_CARE_PROVIDER_SITE_OTHER): Admitting: Surgery

## 2024-02-11 VITALS — BP 97/64 | HR 81 | Ht 61.0 in | Wt 165.0 lb

## 2024-02-11 DIAGNOSIS — N6489 Other specified disorders of breast: Secondary | ICD-10-CM

## 2024-02-11 NOTE — Patient Instructions (Addendum)
 The patient has been asked to return to the office in one year with a bilateral diagnostic mammogram.    Breast Self-Awareness  Breast self-awareness is knowing how your breasts look and feel. You need to: Check your breasts on a regular basis. Tell your doctor about any changes. Become familiar with the look and feel of your breasts. This can help you catch a breast problem while it is still small and can be treated. You should do breast self-exams even if you have breast implants. What you need: A mirror. A well-lit room. A pillow or other soft object. How to do a breast self-exam Follow these steps to do a breast self-exam: Look for changes  Take off all the clothes above your waist. Stand in front of a mirror in a room with good lighting. Put your hands down at your sides. Compare your breasts in the mirror. Look for any difference between them, such as: A difference in shape. A difference in size. Wrinkles, dips, and bumps in one breast and not the other. Look at each breast for changes in the skin, such as: Redness. Scaly areas. Skin that has gotten thicker. Dimpling. Open sores (ulcers). Look for changes in your nipples, such as: Fluid coming out of a nipple. Fluid around a nipple. Bleeding. Dimpling. Redness. A nipple that looks pushed in (retracted), or that has changed position. Feel for changes Lie on your back. Feel each breast. To do this: Pick a breast to feel. Place a pillow under the shoulder closest to that breast. Put the arm closest to that breast behind your head. Feel the nipple area of that breast using the hand of your other arm. Feel the area with the pads of your three middle fingers by making small circles with your fingers. Use light, medium, and firm pressure. Continue the overlapping circles, moving downward over the breast. Keep making circles with your fingers. Stop when you feel your ribs. Start making circles with your fingers again, this  time going upward until you reach your collarbone. Then, make circles outward across your breast and into your armpit area. Squeeze your nipple. Check for discharge and lumps. Repeat these steps to check your other breast. Sit or stand in the tub or shower. With soapy water on your skin, feel each breast the same way you did when you were lying down. Write down what you find Writing down what you find can help you remember what to tell your doctor. Write down: What is normal for each breast. Any changes you find in each breast. These include: The kind of changes you find. A tender or painful breast. Any lump you find. Write down its size and where it is. When you last had your monthly period (menstrual cycle). General tips If you are breastfeeding, the best time to check your breasts is after you feed your baby or after you use a breast pump. If you get monthly bleeding, the best time to check your breasts is 5-7 days after your monthly cycle ends. With time, you will become comfortable with the self-exam. You will also start to know if there are changes in your breasts. Contact a doctor if: You see a change in the shape or size of your breasts or nipples. You see a change in the skin of your breast or nipples, such as red or scaly skin. You have fluid coming from your nipples that is not normal. You find a new lump or thick area. You have breast pain.  You have any concerns about your breast health. Summary Breast self-awareness includes looking for changes in your breasts and feeling for changes within your breasts. You should do breast self-awareness in front of a mirror in a well-lit room. If you get monthly periods (menstrual cycles), the best time to check your breasts is 5-7 days after your period ends. Tell your doctor about any changes you see in your breasts. Changes include changes in size, changes on the skin, painful or tender breasts, or fluid from your nipples that is not  normal. This information is not intended to replace advice given to you by your health care provider. Make sure you discuss any questions you have with your health care provider. Document Revised: 12/12/2021 Document Reviewed: 05/09/2021 Elsevier Patient Education  2024 [employer].

## 2024-02-12 ENCOUNTER — Other Ambulatory Visit: Payer: Self-pay

## 2024-02-12 ENCOUNTER — Encounter: Payer: Self-pay | Admitting: Gastroenterology

## 2024-02-12 ENCOUNTER — Ambulatory Visit: Admitting: Certified Registered"

## 2024-02-12 ENCOUNTER — Ambulatory Visit
Admission: RE | Admit: 2024-02-12 | Discharge: 2024-02-12 | Disposition: A | Discharge status: Home / Self Care | Attending: Gastroenterology | Admitting: Gastroenterology

## 2024-02-12 ENCOUNTER — Encounter
Admission: RE | Disposition: A | Discharge status: Home / Self Care | Payer: Self-pay | Source: Home / Self Care | Attending: Gastroenterology

## 2024-02-12 DIAGNOSIS — K259 Gastric ulcer, unspecified as acute or chronic, without hemorrhage or perforation: Secondary | ICD-10-CM | POA: Diagnosis not present

## 2024-02-12 DIAGNOSIS — Z87891 Personal history of nicotine dependence: Secondary | ICD-10-CM | POA: Diagnosis not present

## 2024-02-12 DIAGNOSIS — Z7984 Long term (current) use of oral hypoglycemic drugs: Secondary | ICD-10-CM | POA: Diagnosis not present

## 2024-02-12 DIAGNOSIS — E119 Type 2 diabetes mellitus without complications: Secondary | ICD-10-CM | POA: Insufficient documentation

## 2024-02-12 DIAGNOSIS — K3189 Other diseases of stomach and duodenum: Secondary | ICD-10-CM | POA: Diagnosis not present

## 2024-02-12 DIAGNOSIS — K2950 Unspecified chronic gastritis without bleeding: Secondary | ICD-10-CM | POA: Diagnosis not present

## 2024-02-12 DIAGNOSIS — K254 Chronic or unspecified gastric ulcer with hemorrhage: Secondary | ICD-10-CM | POA: Diagnosis not present

## 2024-02-12 DIAGNOSIS — I10 Essential (primary) hypertension: Secondary | ICD-10-CM | POA: Insufficient documentation

## 2024-02-12 DIAGNOSIS — E039 Hypothyroidism, unspecified: Secondary | ICD-10-CM | POA: Insufficient documentation

## 2024-02-12 DIAGNOSIS — Z7985 Long-term (current) use of injectable non-insulin antidiabetic drugs: Secondary | ICD-10-CM | POA: Diagnosis not present

## 2024-02-12 DIAGNOSIS — K7460 Unspecified cirrhosis of liver: Secondary | ICD-10-CM | POA: Insufficient documentation

## 2024-02-12 HISTORY — PX: ESOPHAGOGASTRODUODENOSCOPY: SHX5428

## 2024-02-12 LAB — GLUCOSE, CAPILLARY: Glucose-Capillary: 136 mg/dL — ABNORMAL HIGH (ref 70–99)

## 2024-02-12 SURGERY — EGD (ESOPHAGOGASTRODUODENOSCOPY)
Anesthesia: General

## 2024-02-12 MED ORDER — LIDOCAINE HCL (PF) 2 % IJ SOLN
INTRAMUSCULAR | Status: DC | PRN
Start: 2024-02-12 — End: 2024-02-12
  Administered 2024-02-12: 100 mg via INTRADERMAL

## 2024-02-12 MED ORDER — SODIUM CHLORIDE 0.9 % IV SOLN: INTRAVENOUS | Status: DC

## 2024-02-12 MED ORDER — PROPOFOL 500 MG/50ML IV EMUL
INTRAVENOUS | Status: DC | PRN
  Administered 2024-02-12: 100 ug/kg/min via INTRAVENOUS
  Administered 2024-02-12: 70 mg via INTRAVENOUS

## 2024-02-12 MED ORDER — LIDOCAINE HCL (PF) 2 % IJ SOLN
INTRAMUSCULAR | Status: AC
  Filled 2024-02-12: qty 5

## 2024-02-12 MED ORDER — PROPOFOL 10 MG/ML IV BOLUS
INTRAVENOUS | Status: AC
  Filled 2024-02-12: qty 20

## 2024-02-12 NOTE — Anesthesia Postprocedure Evaluation (Signed)
 Anesthesia Post Note  Patient: MAXX CALAWAY  Procedure(s) Performed: EGD (ESOPHAGOGASTRODUODENOSCOPY)  Patient location during evaluation: Endoscopy Anesthesia Type: General Level of consciousness: awake and alert Pain management: pain level controlled Vital Signs Assessment: post-procedure vital signs reviewed and stable Respiratory status: spontaneous breathing, nonlabored ventilation and respiratory function stable Cardiovascular status: blood pressure returned to baseline and stable Postop Assessment: no apparent nausea or vomiting Anesthetic complications: no   No notable events documented.   Last Vitals:  Vitals:   02/12/24 0819 02/12/24 0903  BP: (!) 142/86   Pulse: 74   Resp: 16   Temp: 36.5 C (!) 36.3 C  SpO2: 100%     Last Pain:  Vitals:   02/12/24 0923  TempSrc:   PainSc: 0-No pain                 Fairy POUR Isabella Roemmich

## 2024-02-12 NOTE — Anesthesia Procedure Notes (Signed)
 Procedure Name: MAC Date/Time: 02/12/2024 8:43 AM  Performed by: Delores Evalene BROCKS, CRNAPre-anesthesia Checklist: Patient identified, Emergency Drugs available, Suction available and Patient being monitored Patient Re-evaluated:Patient Re-evaluated prior to induction Oxygen Delivery Method: Nasal cannula Induction Type: IV induction Placement Confirmation: positive ETCO2

## 2024-02-12 NOTE — Op Note (Signed)
 [facility] Gastroenterology Patient Name: Taylor Grimes Procedure Date: 02/12/2024 8:45 AM MRN: [id number] Account #: [id number] Date of Birth: Jan 26, 1961 Admit Type: Outpatient Age: 63 Room: [facility] ENDO ROOM 4 Gender: Female Note Status: Finalized Instrument Name: Barnie Endoscope [id number] Procedure:             Upper GI endoscopy Indications:           Cirrhosis rule out esophageal varices Providers:             Corinn Jess Brooklyn MD, MD Referring MD:          Laine LABOR. Tower (Referring MD) Medicines:             General Anesthesia Complications:         No immediate complications. Estimated blood loss: None. Procedure:             Pre-Anesthesia Assessment:                        - Prior to the procedure, a History and Physical was                         performed, and patient medications and allergies were                         reviewed. The patient is competent. The risks and                         benefits of the procedure and the sedation options and                         risks were discussed with the patient. All questions                         were answered and informed consent was obtained.                         Patient identification and proposed procedure were                         verified by the physician, the nurse, the                         anesthesiologist, the anesthetist and the technician                         in the pre-procedure area in the procedure room in the                         endoscopy suite. Mental Status Examination: alert and                         oriented. Airway Examination: normal oropharyngeal                         airway and neck mobility. Respiratory Examination:                         clear to auscultation. CV Examination: normal.  Prophylactic Antibiotics: The patient does not require                         prophylactic antibiotics. Prior Anticoagulants: The                          patient has taken no anticoagulant or antiplatelet                         agents. ASA Grade Assessment: III - A patient with                         severe systemic disease. After reviewing the risks and                         benefits, the patient was deemed in satisfactory                         condition to undergo the procedure. The anesthesia                         plan was to use general anesthesia. Immediately prior                         to administration of medications, the patient was                         re-assessed for adequacy to receive sedatives. The                         heart rate, respiratory rate, oxygen saturations,                         blood pressure, adequacy of pulmonary ventilation, and                         response to care were monitored throughout the                         procedure. The physical status of the patient was                         re-assessed after the procedure.                        After obtaining informed consent, the endoscope was                         passed under direct vision. Throughout the procedure,                         the patient's blood pressure, pulse, and oxygen                         saturations were monitored continuously. The Endoscope                         was introduced through the mouth, and advanced to the  second part of duodenum. The upper GI endoscopy was                         accomplished without difficulty. The patient tolerated                         the procedure well. Findings:      The duodenal bulb and second portion of the duodenum were normal.      Multiple dispersed diminutive erosions with stigmata of recent bleeding       were found in the gastric body and in the gastric antrum. Biopsies were       taken with a cold forceps for histology.      The cardia and gastric fundus were normal on retroflexion.      The gastroesophageal junction and examined esophagus  were normal. Impression:            - Normal duodenal bulb and second portion of the                         duodenum.                        - Erosive gastropathy with stigmata of recent                         bleeding. Biopsied.                        - Normal gastroesophageal junction and esophagus. Recommendation:        - Await pathology results.                        - Discharge patient to home (with escort).                        - Cardiac diet and diabetic (ADA) diet.                        - Continue present medications. Procedure Code(s):     --- Professional ---                        [phone], Esophagogastroduodenoscopy, flexible,                         transoral; with biopsy, single or multiple Diagnosis Code(s):     --- Professional ---                        K92.2, Gastrointestinal hemorrhage, unspecified                        K74.60, Unspecified cirrhosis of liver CPT copyright 2022 American Medical Association. All rights reserved. The codes documented in this report are preliminary and upon coder review may  be revised to meet current compliance requirements. Dr. Corinn Brooklyn Corinn Jess Brooklyn MD, MD 02/12/2024 9:05:12 AM This report has been signed electronically. Number of Addenda: 0 Note Initiated On: 02/12/2024 8:45 AM Estimated Blood Loss:  Estimated blood loss: none.      [facility]

## 2024-02-12 NOTE — Anesthesia Preprocedure Evaluation (Signed)
 Anesthesia Evaluation  Patient identified by MRN, date of birth, ID band Patient awake    Reviewed: Allergy & Precautions, NPO status , Patient's Chart, lab work & pertinent test results  History of Anesthesia Complications Negative for: history of anesthetic complications  Airway Mallampati: III  TM Distance: <3 FB Neck ROM: full    Dental  (+) Chipped   Pulmonary neg pulmonary ROS, neg shortness of breath, former smoker   Pulmonary exam normal        Cardiovascular Exercise Tolerance: Good hypertension, (-) angina Normal cardiovascular exam     Neuro/Psych  PSYCHIATRIC DISORDERS      negative neurological ROS     GI/Hepatic negative GI ROS, Neg liver ROS,neg GERD  ,,  Endo/Other  diabetes, Type 2Hypothyroidism    Renal/GU negative Renal ROS  negative genitourinary   Musculoskeletal   Abdominal   Peds  Hematology negative hematology ROS (+)   Anesthesia Other Findings Past Medical History: No date: Anxiety No date: Breast lesion     Comment:  benign No date: Diabetes mellitus without complication (HCC) No date: Elevated transaminase level     Comment:  ? 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  From [facility]               Guidelines:  Recommended surveillance after treatment of               endometrial cancer includes a follow-up visit every 3-6               months for 2 years, then every 6 months for 3 years, and               annually thereafter. Each follow-up visit should include               a thorough patient history; elicitation and investigation              of any new symptoms associated with recurrence, such as               vaginal bleeding, pelvic pain, weight loss, or lethargy;               and a thorough speculum, pelvic, and rectovaginal                examination No date: HTN (hypertension) No date: Hypothyroid No date: UTI (urinary tract infection)  Past Surgical History: No date: ABDOMINAL HYSTERECTOMY No date: BREAST BIOPSY     Comment:  benign cyst 2011 01/06/2023: BREAST BIOPSY; Right     Comment:  US  RT BREAST BX W LOC DEV 1ST LESION IMG BX SPEC US                GUIDE 01/06/2023 [facility] 01/07/2022: CATARACT EXTRACTION W/PHACO; Right     Comment:  Procedure: CATARACT EXTRACTION PHACO AND INTRAOCULAR               LENS PLACEMENT (IOC) RIGHT DIABETIC 8.04 01:01.7;                Surgeon: Jaye Fallow, MD;  Location: [facility]              [facility];  Service: Ophthalmology;  Laterality: Right;  Diabetic 10/07/2022: CATARACT EXTRACTION W/PHACO; Left     Comment:  Procedure: CATARACT EXTRACTION PHACO AND INTRAOCULAR               LENS PLACEMENT (IOC) LEFT DIABETIC 4.93 00:40.7;                Surgeon: Jaye Fallow, MD;  Location: [facility]              CNTR;  Service: Ophthalmology;  Laterality: Left;                Diabetic No date: CESAREAN SECTION 11/20/2021: COLONOSCOPY WITH PROPOFOL ; N/A     Comment:  Procedure: COLONOSCOPY WITH PROPOFOL ;  Surgeon: Unk Corinn Skiff, MD;  Location: [facility];  Service:               Gastroenterology;  Laterality: N/A; 08/16/2021: INCISION AND DRAINAGE ABSCESS; Right     Comment:  Procedure: INCISION AND DRAINAGE ABDOMINAL WALL ABSCESS;              Surgeon: Lane Shope, MD;  Location: [facility] ORS;                Service: General;  Laterality: Right; 11/2012: LAPAROSCOPIC TOTAL HYSTERECTOMY     Reproductive/Obstetrics negative OB ROS                              Anesthesia Physical Anesthesia Plan  ASA: 3  Anesthesia Plan: General   Post-op Pain Management:    Induction: Intravenous  PONV Risk Score and Plan: Propofol  infusion and TIVA  Airway Management Planned: Natural Airway and Nasal  Cannula  Additional Equipment:   Intra-op Plan:   Post-operative Plan:   Informed Consent: I have reviewed the patients History and Physical, chart, labs and discussed the procedure including the risks, benefits and alternatives for the proposed anesthesia with the patient or authorized representative who has indicated his/her understanding and acceptance.     Dental Advisory Given  Plan Discussed with: Anesthesiologist, CRNA and Surgeon  Anesthesia Plan Comments: (Patient consented for risks of anesthesia including but not limited to:  - adverse reactions to medications - risk of airway placement if required - damage to eyes, teeth, lips or other oral mucosa - nerve damage due to positioning  - sore throat or hoarseness - Damage to heart, brain, nerves, lungs, other parts of body or loss of life  Patient voiced understanding and assent.)        Anesthesia Quick Evaluation

## 2024-02-12 NOTE — Transfer of Care (Signed)
 Immediate Anesthesia Transfer of Care Note  Patient: Taylor Grimes  Procedure(s) Performed: EGD (ESOPHAGOGASTRODUODENOSCOPY)  Patient Location: PACU  Anesthesia Type:General  Level of Consciousness: awake, alert , and oriented  Airway & Oxygen Therapy: Patient Spontanous Breathing and Patient connected to nasal cannula oxygen  Post-op Assessment: Report given to RN and Post -op Vital signs reviewed and stable  Post vital signs: Reviewed and stable  Last Vitals:  Vitals Value Taken Time  BP    Temp    Pulse    Resp    SpO2      Last Pain:  Vitals:   02/12/24 0819  TempSrc: Oral  PainSc: 0-No pain         Complications: No notable events documented.

## 2024-02-12 NOTE — H&P (Signed)
 Corinn JONELLE Brooklyn, MD [facility], DHIP [address]  [place], [place] [address]  Main: [phone] Fax:  [phone] Pager: [phone]   Primary Care Physician:  Tower, Laine LABOR, MD Primary Gastroenterologist:  Dr. Corinn JONELLE Brooklyn  Pre-Procedure History & Physical: HPI:  NOELANI HARBACH is a 63 y.o. female is here for an endoscopy.   Past Medical History:  Diagnosis Date   Anxiety    Breast lesion    benign   Diabetes mellitus without complication (HCC)    Elevated transaminase level    ? fatty liver    History of endometrial cancer 12/15/2012   FIGO Grade 2 endometrioid adenocarcinoma with squamous differentiation involving the mucosa only.  From [facility] Guidelines:  Recommended surveillance after treatment of endometrial cancer includes a follow-up visit every 3-6 months for 2 years, then every 6 months for 3 years, and annually thereafter. Each follow-up visit should include a thorough patient history; elicitation and investigation of any new symptoms associated with recurrence, such as vaginal bleeding, pelvic pain, weight loss, or lethargy; and a thorough speculum, pelvic, and rectovaginal examination   HTN (hypertension)    Hypothyroid    UTI (urinary tract infection)     Past Surgical History:  Procedure Laterality Date   ABDOMINAL HYSTERECTOMY     BREAST BIOPSY     benign cyst 2011   BREAST BIOPSY Right 01/06/2023   US  RT BREAST BX W LOC DEV 1ST LESION IMG BX SPEC US  GUIDE 01/06/2023 [facility]   CATARACT EXTRACTION W/PHACO Right 01/07/2022   Procedure: CATARACT EXTRACTION PHACO AND INTRAOCULAR LENS PLACEMENT (IOC) RIGHT DIABETIC 8.04 01:01.7;  Surgeon: Jaye Fallow, MD;  Location: [facility];  Service: Ophthalmology;  Laterality: Right;  Diabetic   CATARACT EXTRACTION W/PHACO Left 10/07/2022   Procedure: CATARACT EXTRACTION PHACO AND INTRAOCULAR LENS PLACEMENT (IOC) LEFT DIABETIC 4.93 00:40.7;  Surgeon: Jaye Fallow, MD;   Location: [facility];  Service: Ophthalmology;  Laterality: Left;  Diabetic   CESAREAN SECTION     COLONOSCOPY WITH PROPOFOL  N/A 11/20/2021   Procedure: COLONOSCOPY WITH PROPOFOL ;  Surgeon: Brooklyn Corinn Skiff, MD;  Location: [facility];  Service: Gastroenterology;  Laterality: N/A;   INCISION AND DRAINAGE ABSCESS Right 08/16/2021   Procedure: INCISION AND DRAINAGE ABDOMINAL WALL ABSCESS;  Surgeon: Lane Shope, MD;  Location: [facility];  Service: General;  Laterality: Right;   LAPAROSCOPIC TOTAL HYSTERECTOMY  11/2012    Prior to Admission medications   Medication Sig Start Date End Date Taking? Authorizing Provider  atorvastatin  (LIPITOR) 40 MG tablet Take 1 tablet (40 mg total) by mouth daily. 06/29/23  Yes Tower, Laine LABOR, MD  dicyclomine  (BENTYL ) 20 MG tablet Take 1 tablet (20 mg total) by mouth every 6 (six) hours. 12/20/23  Yes Sung, Jade J, MD  Dulaglutide  (TRULICITY ) 1.5 MG/0.5ML SOAJ INJECT 1.5 MG SUBCUTANEOUSLY ONCE A WEEK 01/26/24  Yes Tower, Laine LABOR, MD  empagliflozin  (JARDIANCE ) 10 MG TABS tablet Take 1 tablet (10 mg total) by mouth daily. 08/21/23  Yes Tower, Laine LABOR, MD  levothyroxine  (SYNTHROID ) 100 MCG tablet TAKE 1 TABLET BY MOUTH EVERY DAY 12/29/23  Yes Trixie File, MD  losartan  (COZAAR ) 100 MG tablet TAKE 1/2 TABLET BY MOUTH DAILY 01/25/24  Yes Tower, Marne A, MD  metFORMIN  (GLUCOPHAGE ) 1000 MG tablet TAKE 1 TABLET (1,000 MG TOTAL) BY MOUTH TWICE A DAY WITH FOOD 10/12/23  Yes Tower, Marne A, MD  clotrimazole  (LOTRIMIN ) 1 % cream APPLY 1 APPLICATION. TOPICALLY 2 (TWO) TIMES  DAILY. 11/24/22   Unk Corinn Skiff, MD    Allergies as of 02/09/2024   (No Known Allergies)    Family History  Problem Relation Age of Onset   Hypothyroidism Mother    Alzheimer's disease Mother    Stroke Father    Deep vein thrombosis Sister    Colon cancer Neg Hx    Esophageal cancer Neg Hx    Stomach cancer Neg Hx    Rectal cancer Neg Hx    Breast cancer Neg Hx     Social History    Socioeconomic History   Marital status: Legally Separated    Spouse name: Not on file   Number of children: 2   Years of education: Not on file   Highest education level: 12th grade  Occupational History   Occupation: shift Clinical cytogeneticist: [employer]  Tobacco Use   Smoking status: Former    Current packs/day: 0.00    Types: Cigarettes    Quit date: 07/22/1979    Years since quitting: 44.5    Passive exposure: Never   Smokeless tobacco: Never   Tobacco comments:    briefly smoked at 16.  (May occasionally have 1 cig)  Vaping Use   Vaping status: Never Used  Substance and Sexual Activity   Alcohol use: Not Currently    Comment: rarely   Drug use: No   Sexual activity: Yes    Birth control/protection: Post-menopausal  Other Topics Concern   Not on file  Social History Narrative   Not on file   Social Drivers of Health   Financial Resource Strain: Low Risk  (09/24/2023)   Overall Financial Resource Strain (CARDIA)    Difficulty of Paying Living Expenses: Not hard at all  Food Insecurity: No Food Insecurity (09/24/2023)   Hunger Vital Sign    Worried About Running Out of Food in the Last Year: Never true    Ran Out of Food in the Last Year: Never true  Transportation Needs: No Transportation Needs (09/24/2023)   PRAPARE - Administrator, Civil Service (Medical): No    Lack of Transportation (Non-Medical): No  Physical Activity: Insufficiently Active (09/24/2023)   Exercise Vital Sign    Days of Exercise per Week: 2 days    Minutes of Exercise per Session: 20 min  Stress: No Stress Concern Present (09/24/2023)   [employer] of Occupational Health - Occupational Stress Questionnaire    Feeling of Stress : Only a little  Social Connections: Socially Isolated (09/24/2023)   Social Connection and Isolation Panel    Frequency of Communication with Friends and Family: More than three times a week    Frequency of Social Gatherings with Friends and  Family: Once a week    Attends Religious Services: Never    Database administrator or Organizations: No    Attends Engineer, structural: Not on file    Marital Status: Divorced  Catering manager Violence: Not on file    Review of Systems: See HPI, otherwise negative ROS  Physical Exam: BP (!) 142/86   Pulse 74   Temp 97.7 F (36.5 C) (Oral)   Resp 16   Ht 5' 1 (1.549 m)   Wt 75.4 kg   LMP 07/21/2008   SpO2 100%   BMI 31.40 kg/m  General:   Alert,  pleasant and cooperative in NAD Head:  Normocephalic and atraumatic. Neck:  Supple; no masses or thyromegaly. Lungs:  Clear throughout to auscultation.  Heart:  Regular rate and rhythm. Abdomen:  Soft, nontender and nondistended. Normal bowel sounds, without guarding, and without rebound.   Neurologic:  Alert and  oriented x4;  grossly normal neurologically.  Impression/Plan: Julianah S Chargois is here for an endoscopy to be performed for possible cirrhosis of liver, variceal screening  Risks, benefits, limitations, and alternatives regarding  endoscopy have been reviewed with the patient.  Questions have been answered.  All parties agreeable.   Corinn Brooklyn, MD  02/12/2024, 8:39 AM

## 2024-02-16 LAB — SURGICAL PATHOLOGY

## 2024-02-17 ENCOUNTER — Ambulatory Visit: Payer: Self-pay | Admitting: Gastroenterology

## 2024-02-17 NOTE — Progress Notes (Signed)
 Normal pathology results from upper endoscopy  RV

## 2024-02-22 ENCOUNTER — Other Ambulatory Visit: Payer: Self-pay | Admitting: Gastroenterology

## 2024-02-22 ENCOUNTER — Other Ambulatory Visit: Payer: Self-pay | Admitting: Radiology

## 2024-02-22 DIAGNOSIS — K7460 Unspecified cirrhosis of liver: Secondary | ICD-10-CM

## 2024-02-23 ENCOUNTER — Other Ambulatory Visit: Payer: Self-pay | Admitting: *Deleted

## 2024-02-23 DIAGNOSIS — E119 Type 2 diabetes mellitus without complications: Secondary | ICD-10-CM

## 2024-02-23 MED ORDER — TRULICITY 1.5 MG/0.5ML ~~LOC~~ SOAJ: 1.5000 mg | SUBCUTANEOUS | 0 refills | Status: DC

## 2024-02-23 MED ORDER — ATORVASTATIN CALCIUM 40 MG PO TABS: 40.0000 mg | ORAL_TABLET | Freq: Every day | ORAL | 0 refills | Status: DC

## 2024-02-23 MED ORDER — LOSARTAN POTASSIUM 100 MG PO TABS: 50.0000 mg | ORAL_TABLET | Freq: Every day | ORAL | 0 refills | Status: DC

## 2024-02-23 MED ORDER — ACCU-CHEK GUIDE W/DEVICE KIT: PACK | 0 refills | Status: AC

## 2024-02-23 MED ORDER — METFORMIN HCL 1000 MG PO TABS
ORAL_TABLET | ORAL | 0 refills | Status: DC
Start: 2024-02-23 — End: 2024-05-05

## 2024-02-23 MED ORDER — ACCU-CHEK SOFTCLIX LANCETS MISC: 0 refills | Status: DC

## 2024-02-23 MED ORDER — BD SWAB SINGLE USE REGULAR PADS: MEDICATED_PAD | 0 refills | Status: DC

## 2024-02-23 MED ORDER — EMPAGLIFLOZIN 10 MG PO TABS: 10.0000 mg | ORAL_TABLET | Freq: Every day | ORAL | 0 refills | Status: DC

## 2024-02-23 MED ORDER — ACCU-CHEK GUIDE TEST VI STRP: ORAL_STRIP | 0 refills | Status: DC

## 2024-02-23 NOTE — Telephone Encounter (Signed)
 Received fax requesting Rxs to be transferred to [facility] (mail order).  Pt has f/u scheduled 03/30/24  Last filled on 01/26/24 2 ml but was sent to local pharmacy.   (Requesting 3 month Rx)

## 2024-02-23 NOTE — Addendum Note (Signed)
 Addended by: RAYLEEN GLENDORA HERO on: 02/23/2024 02:23 PM   Modules accepted: Orders

## 2024-02-24 ENCOUNTER — Other Ambulatory Visit: Payer: Self-pay | Admitting: Radiology

## 2024-02-26 NOTE — Progress Notes (Signed)
 Patient for US  guided Liver Biopsy on Mon 02/29/24, I called and spoke with the patient on the phone and gave pre-procedure instructions. Pt was made aware to be here at 9a, NPO after MN prior to procedure as well as driver post procedure/recovery/discharge. Pt stated understanding.  Called 02/26/24

## 2024-02-28 NOTE — H&P (Signed)
 Chief Complaint: Found to have nodularity of the liver. Concern for cirrhosis. Request is for random liver biopsy   Referring Physician(s): Unk Corinn Skiff  Supervising Physician: Karalee Beat  Patient Status: [facility] - Out-pt  History of Present Illness: Taylor FISCHBACH is a 63 y.o. female outpatient. History of endometrial cancer, fatty liver, HTN. Found to have nodularity of the liver and enlarged spleen wile being worked up for abdominal pain.  Team is concerned for cirrhosis. Endoscopy performed on 7.25.25 resulted in normal pathology. Team is requesting a liver biopsy for further evaluation of cirrhosis.  Son at bedside. Currently without any significant complaints. Patient alert and laying in bed,calm. Denies any fevers, headache, chest pain, SOB, cough, abdominal pain, nausea, vomiting or bleeding.   US  abdomen complete performed on 7.2.25 reads Liver: No focal lesion identified. Heterogeneous and coarsened in parenchymal echogenicity. Lobular liver contours. Portal vein is patent on color Doppler imaging with normal direction of blood flow towards the liver.  Currently without any significant complaints. Patient alert and laying in bed,calm. Denies any fevers, headache, chest pain, SOB, cough, abdominal pain, nausea, vomiting or bleeding.    Labs pending. NKDA. Patient is has been NPO since midnight  Return precautions and treatment recommendations and follow-up discussed with the patient and her son. Both  who are agreeable with the plan.    Past Medical History:  Diagnosis Date   Anxiety    Breast lesion    benign   Diabetes mellitus without complication (HCC)    Elevated transaminase level    ? fatty liver    History of endometrial cancer 12/15/2012   FIGO Grade 2 endometrioid adenocarcinoma with squamous differentiation involving the mucosa only.  From SGO Guidelines:  Recommended surveillance after treatment of endometrial cancer includes a follow-up visit  every 3-6 months for 2 years, then every 6 months for 3 years, and annually thereafter. Each follow-up visit should include a thorough patient history; elicitation and investigation of any new symptoms associated with recurrence, such as vaginal bleeding, pelvic pain, weight loss, or lethargy; and a thorough speculum, pelvic, and rectovaginal examination   HTN (hypertension)    Hypothyroid    UTI (urinary tract infection)     Past Surgical History:  Procedure Laterality Date   ABDOMINAL HYSTERECTOMY     BREAST BIOPSY     benign cyst 2011   BREAST BIOPSY Right 01/06/2023   US  RT BREAST BX W LOC DEV 1ST LESION IMG BX SPEC US  GUIDE 01/06/2023 [facility]   CATARACT EXTRACTION W/PHACO Right 01/07/2022   Procedure: CATARACT EXTRACTION PHACO AND INTRAOCULAR LENS PLACEMENT (IOC) RIGHT DIABETIC 8.04 01:01.7;  Surgeon: Jaye Fallow, MD;  Location: [facility];  Service: Ophthalmology;  Laterality: Right;  Diabetic   CATARACT EXTRACTION W/PHACO Left 10/07/2022   Procedure: CATARACT EXTRACTION PHACO AND INTRAOCULAR LENS PLACEMENT (IOC) LEFT DIABETIC 4.93 00:40.7;  Surgeon: Jaye Fallow, MD;  Location: [facility];  Service: Ophthalmology;  Laterality: Left;  Diabetic   CESAREAN SECTION     COLONOSCOPY WITH PROPOFOL  N/A 11/20/2021   Procedure: COLONOSCOPY WITH PROPOFOL ;  Surgeon: Unk Corinn Skiff, MD;  Location: [facility];  Service: Gastroenterology;  Laterality: N/A;   ESOPHAGOGASTRODUODENOSCOPY N/A 02/12/2024   Procedure: EGD (ESOPHAGOGASTRODUODENOSCOPY);  Surgeon: Unk Corinn Skiff, MD;  Location: [facility] ENDOSCOPY;  Service: Gastroenterology;  Laterality: N/A;  DM   INCISION AND DRAINAGE ABSCESS Right 08/16/2021   Procedure: INCISION AND DRAINAGE ABDOMINAL WALL ABSCESS;  Surgeon: Lane Shope, MD;  Location: [facility] ORS;  Service:  General;  Laterality: Right;   LAPAROSCOPIC TOTAL HYSTERECTOMY  11/2012    Allergies: Patient has no known  allergies.  Medications: Prior to Admission medications   Medication Sig Start Date End Date Taking? Authorizing Provider  Blood Glucose Monitoring Suppl (ACCU-CHEK GUIDE) w/Device KIT Use to check blood sugar once daily (dx. E11.9) 02/23/24   Tower, Laine LABOR, MD  glucose blood (ACCU-CHEK GUIDE TEST) test strip Use to check blood sugar once daily (dx. E11.9) 02/23/24   Tower, Laine LABOR, MD  Accu-Chek Softclix Lancets lancets Check blood sugar once daily. (Dx. E11.9) 02/23/24   Tower, Laine LABOR, MD  Alcohol Swabs (B-D SINGLE USE SWABS REGULAR) PADS Use when checking blood sugar once daily (dx. E11.9) 02/23/24   Tower, Laine LABOR, MD  atorvastatin  (LIPITOR) 40 MG tablet Take 1 tablet (40 mg total) by mouth daily. 02/23/24   Tower, Laine LABOR, MD  clotrimazole  (LOTRIMIN ) 1 % cream APPLY 1 APPLICATION. TOPICALLY 2 (TWO) TIMES DAILY. 11/24/22   Unk Corinn Skiff, MD  dicyclomine  (BENTYL ) 20 MG tablet Take 1 tablet (20 mg total) by mouth every 6 (six) hours. 12/20/23   Sung, Jade J, MD  Dulaglutide  (TRULICITY ) 1.5 MG/0.5ML SOAJ Inject 1.5 mg as directed once a week. 02/23/24   Tower, Laine LABOR, MD  empagliflozin  (JARDIANCE ) 10 MG TABS tablet Take 1 tablet (10 mg total) by mouth daily. 02/23/24   Tower, Laine LABOR, MD  levothyroxine  (SYNTHROID ) 100 MCG tablet TAKE 1 TABLET BY MOUTH EVERY DAY 12/29/23   Trixie File, MD  losartan  (COZAAR ) 100 MG tablet Take 0.5 tablets (50 mg total) by mouth daily. 02/23/24   Tower, Laine LABOR, MD  metFORMIN  (GLUCOPHAGE ) 1000 MG tablet TAKE 1 TABLET (1,000 MG TOTAL) BY MOUTH TWICE A DAY WITH FOOD 02/23/24   Tower, Laine LABOR, MD     Family History  Problem Relation Age of Onset   Hypothyroidism Mother    Alzheimer's disease Mother    Stroke Father    Deep vein thrombosis Sister    Colon cancer Neg Hx    Esophageal cancer Neg Hx    Stomach cancer Neg Hx    Rectal cancer Neg Hx    Breast cancer Neg Hx     Social History   Socioeconomic History   Marital status: Legally Separated    Spouse name:  Not on file   Number of children: 2   Years of education: Not on file   Highest education level: 12th grade  Occupational History   Occupation: shift Clinical cytogeneticist: [employer]  Tobacco Use   Smoking status: Former    Current packs/day: 0.00    Types: Cigarettes    Quit date: 07/22/1979    Years since quitting: 44.6    Passive exposure: Never   Smokeless tobacco: Never   Tobacco comments:    briefly smoked at 16.  (May occasionally have 1 cig)  Vaping Use   Vaping status: Never Used  Substance and Sexual Activity   Alcohol use: Not Currently    Comment: rarely   Drug use: No   Sexual activity: Yes    Birth control/protection: Post-menopausal  Other Topics Concern   Not on file  Social History Narrative   Not on file   Social Drivers of Health   Financial Resource Strain: Low Risk  (09/24/2023)   Overall Financial Resource Strain (CARDIA)    Difficulty of Paying Living Expenses: Not hard at all  Food Insecurity: No Food Insecurity (09/24/2023)  Hunger Vital Sign    Worried About Running Out of Food in the Last Year: Never true    Ran Out of Food in the Last Year: Never true  Transportation Needs: No Transportation Needs (09/24/2023)   PRAPARE - Administrator, Civil Service (Medical): No    Lack of Transportation (Non-Medical): No  Physical Activity: Insufficiently Active (09/24/2023)   Exercise Vital Sign    Days of Exercise per Week: 2 days    Minutes of Exercise per Session: 20 min  Stress: No Stress Concern Present (09/24/2023)   [employer] of Occupational Health - Occupational Stress Questionnaire    Feeling of Stress : Only a little  Social Connections: Socially Isolated (09/24/2023)   Social Connection and Isolation Panel    Frequency of Communication with Friends and Family: More than three times a week    Frequency of Social Gatherings with Friends and Family: Once a week    Attends Religious Services: Never    Database administrator  or Organizations: No    Attends Engineer, structural: Not on file    Marital Status: Divorced    Review of Systems: A 12 point ROS discussed and pertinent positives are indicated in the HPI above.  All other systems are negative.  Review of Systems  Constitutional:  Negative for fatigue and fever.  HENT:  Negative for congestion.   Respiratory:  Negative for cough and shortness of breath.   Gastrointestinal:  Negative for abdominal pain, diarrhea, nausea and vomiting.    Vital Signs: BP (!) 156/93   Pulse 78   Temp 98.1 F (36.7 C) (Oral)   Resp 16   Ht 5' 1 (1.549 m)   Wt 160 lb (72.6 kg)   LMP 07/21/2008   SpO2 95%   BMI 30.23 kg/m   Advance Care Plan: The advanced care plan/surrogate decision maker was discussed at the time of visit and the patient did not wish to discuss or was not able to name a surrogate decision maker or provide an advance care plan.    Physical Exam Vitals and nursing note reviewed.  Constitutional:      Appearance: She is well-developed.  HENT:     Head: Normocephalic and atraumatic.     Mouth/Throat:     Mouth: Mucous membranes are dry.  Eyes:     Conjunctiva/sclera: Conjunctivae normal.  Cardiovascular:     Rate and Rhythm: Normal rate and regular rhythm.  Pulmonary:     Effort: Pulmonary effort is normal.  Musculoskeletal:        General: Normal range of motion.     Cervical back: Normal range of motion.  Skin:    General: Skin is warm and dry.  Neurological:     General: No focal deficit present.     Mental Status: She is alert and oriented to person, place, and time. Mental status is at baseline.  Psychiatric:        Mood and Affect: Mood normal.        Behavior: Behavior normal.        Thought Content: Thought content normal.        Judgment: Judgment normal.     Imaging: MM 3D DIAGNOSTIC MAMMOGRAM BILATERAL BREAST Result Date: 02/04/2024 CLINICAL DATA:  63 year old female for 12 month follow-up of RIGHT breast  complex sclerosing lesion biopsied on 01/06/2023. Imaging follow-up instead of surgery is being pursued. EXAM: DIGITAL DIAGNOSTIC BILATERAL MAMMOGRAM WITH TOMOSYNTHESIS AND CAD; ULTRASOUND RIGHT  BREAST LIMITED TECHNIQUE: Bilateral digital diagnostic mammography and breast tomosynthesis was performed. The images were evaluated with computer-aided detection. ; Targeted ultrasound examination of the right breast was performed COMPARISON:  Previous exam(s). ACR Breast Density Category b: There are scattered areas of fibroglandular density. FINDINGS: There is no mammographic evidence of malignancy in the LEFT breast. Full field views of the RIGHT breast demonstrate ribbon biopsy clip within the anterior RIGHT breast, without new or suspicious mammographic changes regionally. Targeted ultrasound is performed at the 12 o'clock position 3 cm from the nipple, again showing an echogenic biopsy clip, with prominent surrounding ducks in decreased conspicuity of the adjacent suspected intraductal mass, which is challenging to measure on today's examination but measures no more than 4 mm, previously 5-6 mm when measured in a similar fashion on comparable comparison images. There are no new or suspicious findings in the region. IMPRESSION: No significant interval change in mammographic or sonographic appearance of biopsy-proven RIGHT breast complex sclerosing lesion, for which the patient has opted for surveillance. No mammographic evidence of malignancy in EITHER breast. RECOMMENDATION: BILATERAL diagnostic mammogram and RIGHT breast ultrasound in 12 months, which will complete 2 years of surveillance. I have discussed the findings and recommendations with the patient. If applicable, a reminder letter will be sent to the patient regarding the next appointment. BI-RADS CATEGORY  2: Benign. Electronically Signed   By: Norleen Croak M.D.   On: 02/04/2024 14:16   US  LIMITED ULTRASOUND INCLUDING AXILLA RIGHT BREAST Result Date:  02/04/2024 CLINICAL DATA:  63 year old female for 12 month follow-up of RIGHT breast complex sclerosing lesion biopsied on 01/06/2023. Imaging follow-up instead of surgery is being pursued. EXAM: DIGITAL DIAGNOSTIC BILATERAL MAMMOGRAM WITH TOMOSYNTHESIS AND CAD; ULTRASOUND RIGHT BREAST LIMITED TECHNIQUE: Bilateral digital diagnostic mammography and breast tomosynthesis was performed. The images were evaluated with computer-aided detection. ; Targeted ultrasound examination of the right breast was performed COMPARISON:  Previous exam(s). ACR Breast Density Category b: There are scattered areas of fibroglandular density. FINDINGS: There is no mammographic evidence of malignancy in the LEFT breast. Full field views of the RIGHT breast demonstrate ribbon biopsy clip within the anterior RIGHT breast, without new or suspicious mammographic changes regionally. Targeted ultrasound is performed at the 12 o'clock position 3 cm from the nipple, again showing an echogenic biopsy clip, with prominent surrounding ducks in decreased conspicuity of the adjacent suspected intraductal mass, which is challenging to measure on today's examination but measures no more than 4 mm, previously 5-6 mm when measured in a similar fashion on comparable comparison images. There are no new or suspicious findings in the region. IMPRESSION: No significant interval change in mammographic or sonographic appearance of biopsy-proven RIGHT breast complex sclerosing lesion, for which the patient has opted for surveillance. No mammographic evidence of malignancy in EITHER breast. RECOMMENDATION: BILATERAL diagnostic mammogram and RIGHT breast ultrasound in 12 months, which will complete 2 years of surveillance. I have discussed the findings and recommendations with the patient. If applicable, a reminder letter will be sent to the patient regarding the next appointment. BI-RADS CATEGORY  2: Benign. Electronically Signed   By: Norleen Croak M.D.   On:  02/04/2024 14:16    Labs:  CBC: Recent Labs    12/20/23 0138 02/29/24 0913  WBC 11.9* 7.2  HGB 15.3* 15.0  HCT 44.4 45.1  PLT 169 153    COAGS: No results for input(s): INR, APTT in the last 8760 hours.  BMP: Recent Labs    03/18/23 0739 09/28/23 1014  12/20/23 0138  NA 141 141 141  K 3.8 4.3 4.3  CL 106 103 103  CO2 26 27 26   GLUCOSE 133* 158* 249*  BUN 11 9 11   CALCIUM  9.7 9.4 9.6  CREATININE 0.87 0.76 0.79  GFRNONAA  --   --  >60    LIVER FUNCTION TESTS: Recent Labs    03/18/23 0739 09/28/23 1014 12/20/23 0138  BILITOT 1.0 1.2 1.4*  AST 34 30 36  ALT 26 22 21   ALKPHOS 89 112 107  PROT 7.0 7.0 7.8  ALBUMIN 3.6 3.8 3.9    TUMOR MARKERS: No results for input(s): AFPTM, CEA, CA199, CHROMGRNA in the last 8760 hours.  Assessment and Plan:  63 yo. Female outpatient. History of endometrial cancer, fatty liver, HTN. Found to have nodularity of the liver and enlarged spleen wile being worked up for abdominal pain.  Team is concerned for cirrhosis. Endoscopy performed on 7.25.25 resulted in normal pathology. Team is requesting a liver biopsy for further evaluation of cirrhosis   PLAN: IR Image Guided Liver biopsy   Risks and benefits of liver biopsy  was discussed with the patient and/or patient's family including, but not limited to bleeding, infection, damage to adjacent structures or low yield requiring additional tests.  All of the questions were answered and there is agreement to proceed.  Consent signed and in chart.  Thank you for this interesting consult.  I greatly enjoyed meeting Rileigh S Ollis and look forward to participating in their care.  A copy of this report was sent to the requesting provider on this date.  Electronically Signed: Delon JAYSON Beagle, NP 02/29/2024, 9:35 AM   I spent a total of  30 Minutes   in face to face in clinical consultation, greater than 50% of which was counseling/coordinating care for random liver  biopsy

## 2024-02-29 ENCOUNTER — Other Ambulatory Visit (HOSPITAL_COMMUNITY): Payer: Self-pay | Admitting: Radiology

## 2024-02-29 ENCOUNTER — Ambulatory Visit
Admission: RE | Admit: 2024-02-29 | Discharge: 2024-02-29 | Disposition: A | Discharge status: Home / Self Care | Source: Ambulatory Visit | Attending: Gastroenterology | Admitting: Gastroenterology

## 2024-02-29 ENCOUNTER — Other Ambulatory Visit: Payer: Self-pay

## 2024-02-29 DIAGNOSIS — K769 Liver disease, unspecified: Secondary | ICD-10-CM | POA: Diagnosis not present

## 2024-02-29 DIAGNOSIS — R161 Splenomegaly, not elsewhere classified: Secondary | ICD-10-CM | POA: Insufficient documentation

## 2024-02-29 DIAGNOSIS — K760 Fatty (change of) liver, not elsewhere classified: Secondary | ICD-10-CM | POA: Diagnosis not present

## 2024-02-29 DIAGNOSIS — Z01812 Encounter for preprocedural laboratory examination: Secondary | ICD-10-CM | POA: Diagnosis not present

## 2024-02-29 DIAGNOSIS — I10 Essential (primary) hypertension: Secondary | ICD-10-CM | POA: Diagnosis not present

## 2024-02-29 DIAGNOSIS — K7460 Unspecified cirrhosis of liver: Secondary | ICD-10-CM | POA: Insufficient documentation

## 2024-02-29 LAB — CBC
HCT: 45.1 % (ref 36.0–46.0)
Hemoglobin: 15 g/dL (ref 12.0–15.0)
MCH: 30.1 pg (ref 26.0–34.0)
MCHC: 33.3 g/dL (ref 30.0–36.0)
MCV: 90.6 fL (ref 80.0–100.0)
Platelets: 153 K/uL (ref 150–400)
RBC: 4.98 MIL/uL (ref 3.87–5.11)
RDW: 12.5 % (ref 11.5–15.5)
WBC: 7.2 K/uL (ref 4.0–10.5)
nRBC: 0 % (ref 0.0–0.2)

## 2024-02-29 LAB — PROTIME-INR
INR: 1 (ref 0.8–1.2)
Prothrombin Time: 13.7 s (ref 11.4–15.2)

## 2024-02-29 LAB — GLUCOSE, CAPILLARY: Glucose-Capillary: 117 mg/dL — ABNORMAL HIGH (ref 70–99)

## 2024-02-29 MED ORDER — SODIUM CHLORIDE 0.9 % IV SOLN
INTRAVENOUS | Status: DC
  Administered 2024-02-29 (×2): 250 mL via INTRAVENOUS

## 2024-02-29 MED ORDER — LIDOCAINE HCL (PF) 1 % IJ SOLN
10.0000 mL | Freq: Once | INTRAMUSCULAR | Status: DC
  Filled 2024-02-29: qty 10

## 2024-02-29 MED ORDER — MIDAZOLAM HCL 2 MG/2ML IJ SOLN
INTRAMUSCULAR | Status: AC | PRN
Start: 2024-02-29 — End: 2024-02-29
  Administered 2024-02-29 (×2): 1 mg via INTRAVENOUS

## 2024-02-29 MED ORDER — FENTANYL CITRATE (PF) 100 MCG/2ML IJ SOLN
INTRAMUSCULAR | Status: AC
  Filled 2024-02-29: qty 2

## 2024-02-29 MED ORDER — LIDOCAINE 1 % OPTIME INJ - NO CHARGE
10.0000 mL | Freq: Once | INTRAMUSCULAR | Status: AC
  Administered 2024-02-29 (×2): 10 mL via INTRADERMAL
  Filled 2024-02-29: qty 10

## 2024-02-29 MED ORDER — FENTANYL CITRATE (PF) 100 MCG/2ML IJ SOLN
INTRAMUSCULAR | Status: AC | PRN
  Administered 2024-02-29 (×2): 50 ug via INTRAVENOUS

## 2024-02-29 MED ORDER — MIDAZOLAM HCL 2 MG/2ML IJ SOLN
INTRAMUSCULAR | Status: AC
  Filled 2024-02-29: qty 2

## 2024-02-29 NOTE — Discharge Instructions (Signed)
 Liver Biopsy, Care After These instructions give you information about how to care for yourself after your procedure. Your health care provider may also give you more specific instructions. If you have problems or questions, contact your health care provider. What can I expect after the procedure? After your procedure, it is common to have: Pain and soreness in the area where the biopsy was done. Bruising around the area where the biopsy was done. Sleepiness and fatigue for 1-2 days. Follow these instructions at home: Medicines Take over-the-counter and prescription medicines only as told by your health care provider. If you were prescribed an antibiotic medicine, take it as told by your health care provider. Do not stop taking the antibiotic even if you start to feel better. Do not take medicines such as aspirin and ibuprofen unless your health care provider tells you to take them. These medicines thin your blood and can increase the risk of bleeding. If you are taking prescription pain medicine, take actions to prevent or treat constipation. Your health care provider may recommend that you: Drink enough fluid to keep your urine pale yellow. Eat foods that are high in fiber, such as fresh fruits and vegetables, whole grains, and beans. Limit foods that are high in fat and processed sugars, such as fried or sweet foods. Take an over-the-counter or prescription medicine for constipation. Incision care Wash your hands with soap and water before you change your bandage (dressing). If soap and water are not available, use hand sanitizer. Change your bandage tomorrow after you shower and then remove the next day. Check your incision area every day for signs of infection. Check for: Redness, swelling, or pain. Fluid or blood. Warmth. Pus or a bad smell. You may shower tomorrow No lifting more than 5 lbs for 3 days Return to your normal activities as told by your health care provider.  Do not  drive or use heavy machinery for 24hrs or while taking prescription pain medicine. Do not play contact sports for 2 weeks after the procedure. General instructions  Do not drink alcohol in the first week after the procedure. Have someone stay with you for at least 24 hours after the procedure. It is your responsibility to obtain your test results. Ask your health care provider, or the department that is doing the test: When will my results be ready? How will I get my results? What are my treatment options? What other tests do I need? What are my next steps? Keep all follow-up visits as told by your health care provider. This is important. Contact a health care provider if: You have increased bleeding from an incision, resulting in more than a small spot of blood. You have redness, swelling, or increasing pain in any incisions. You notice a discharge or a bad smell coming from any of your incisions. You have a fever or chills. Get help right away if: You develop swelling, bloating, or pain in your abdomen. You become dizzy or faint. You develop a rash. You have nausea or you vomit. You faint, or you have shortness of breath or difficulty breathing. You develop chest pain. You have problems with your speech or vision. You have trouble with your balance or moving your arms or legs. Summary After the liver biopsy, it is common to have pain, soreness, and bruising in the area, as well as sleepiness and fatigue. Take over-the-counter and prescription medicines only as told by your health care provider. Follow instructions from your health care provider about how  to care for your incision. Check the incision area daily for signs of infection. This information is not intended to replace advice given to you by your health care provider. Make sure you discuss any questions you have with your health care provider. Document Released: 01/24/2005 Document Revised: 08/30/2018 Document Reviewed:  07/17/2017 Elsevier Patient Education  2020 [employer].

## 2024-03-01 ENCOUNTER — Other Ambulatory Visit: Payer: Self-pay

## 2024-03-01 MED ORDER — LEVOTHYROXINE SODIUM 100 MCG PO TABS: 100.0000 ug | ORAL_TABLET | Freq: Every day | ORAL | 1 refills | Status: DC

## 2024-03-02 ENCOUNTER — Telehealth: Payer: Self-pay | Admitting: *Deleted

## 2024-03-03 ENCOUNTER — Telehealth: Payer: Self-pay

## 2024-03-03 ENCOUNTER — Other Ambulatory Visit (HOSPITAL_COMMUNITY): Payer: Self-pay

## 2024-03-03 LAB — SURGICAL PATHOLOGY

## 2024-03-03 NOTE — Telephone Encounter (Signed)
 Per test claim: Refill too soon. PA is not needed at this time. Medication was filled 02/24/24. Next eligible fill date is 05/04/24.

## 2024-03-03 NOTE — Telephone Encounter (Signed)
 Pharmacy Patient Advocate Encounter   Received notification from Pt Calls Messages that prior authorization for TRULICITY  is required/requested.   Insurance verification completed.   The patient is insured through [employer] .   Per test claim: Refill too soon. PA is not needed at this time. Medication was filled 02/24/24. Next eligible fill date is 05/04/24.

## 2024-03-07 ENCOUNTER — Ambulatory Visit: Payer: Self-pay | Admitting: Gastroenterology

## 2024-03-07 ENCOUNTER — Ambulatory Visit: Admitting: Podiatry

## 2024-03-30 ENCOUNTER — Encounter: Payer: Self-pay | Admitting: Family Medicine

## 2024-03-30 ENCOUNTER — Ambulatory Visit: Payer: Self-pay | Admitting: Family Medicine

## 2024-03-30 ENCOUNTER — Ambulatory Visit: Admitting: Family Medicine

## 2024-03-30 VITALS — BP 120/80 | HR 77 | Ht 61.0 in | Wt 162.8 lb

## 2024-03-30 DIAGNOSIS — I10 Essential (primary) hypertension: Secondary | ICD-10-CM | POA: Diagnosis not present

## 2024-03-30 DIAGNOSIS — H00015 Hordeolum externum left lower eyelid: Secondary | ICD-10-CM

## 2024-03-30 DIAGNOSIS — E785 Hyperlipidemia, unspecified: Secondary | ICD-10-CM | POA: Diagnosis not present

## 2024-03-30 DIAGNOSIS — R42 Dizziness and giddiness: Secondary | ICD-10-CM

## 2024-03-30 DIAGNOSIS — E1169 Type 2 diabetes mellitus with other specified complication: Secondary | ICD-10-CM

## 2024-03-30 DIAGNOSIS — E119 Type 2 diabetes mellitus without complications: Secondary | ICD-10-CM

## 2024-03-30 DIAGNOSIS — K760 Fatty (change of) liver, not elsewhere classified: Secondary | ICD-10-CM

## 2024-03-30 DIAGNOSIS — K7581 Nonalcoholic steatohepatitis (NASH): Secondary | ICD-10-CM | POA: Insufficient documentation

## 2024-03-30 LAB — MICROALBUMIN / CREATININE URINE RATIO
Creatinine,U: 81.7 mg/dL
Microalb Creat Ratio: 25.7 mg/g (ref 0.0–30.0)
Microalb, Ur: 2.1 mg/dL — ABNORMAL HIGH (ref 0.0–1.9)

## 2024-03-30 LAB — POCT GLYCOSYLATED HEMOGLOBIN (HGB A1C): Hemoglobin A1C: 6.7 % — AB (ref 4.0–5.6)

## 2024-03-30 NOTE — Assessment & Plan Note (Signed)
 Right lower eyelid Prone to these Recommend keeping clean and use of warm compress Update if not starting to improve in a week or if worsening  Call back and Er precautions noted in detail today

## 2024-03-30 NOTE — Assessment & Plan Note (Signed)
 bp in fair control at this time  BP Readings from Last 1 Encounters:  03/30/24 120/80   No changes needed Most recent labs reviewed  Disc lifstyle change with low sodium diet and exercise  Plan to continue losartan  50 mg daily

## 2024-03-30 NOTE — Assessment & Plan Note (Signed)
 Had recent liver bx confirming cirrhosis  Pt has liver/GI follow up planned Continues to loose weight with diet and exercise  Commended

## 2024-03-30 NOTE — Progress Notes (Signed)
 Subjective:    Patient ID: Taylor Grimes, female    DOB: 11-14-1960, 63 y.o.   MRN: [id number]  HPI  Wt Readings from Last 3 Encounters:  03/30/24 162 lb 12.8 oz (73.8 kg)  02/29/24 160 lb (72.6 kg)  02/12/24 166 lb 3.2 oz (75.4 kg)   30.76 kg/m  Vitals:   03/30/24 0951  BP: 120/80  Pulse: 77  SpO2: 96%    Pt presents for follow up of  DM2 HTN Hyperlipidemia  Also had emesis  Patient states she recently had an Endoscopy procedure and got sick after the procedure. Patient says a few days after she got sick again with vomiting and episodes of dizziness. Patient says she has not had neither of the symptoms recently. Patient said she tried Sprite to help settle her stomach   Did have liver bx confirming cirrhosis from fatty liver-planning GI follow up for this Is feeling better than that now   Stye Dizziness -occational she feels dizzy - does not correlate with blood sugar  Gets nauseated when she gets dizzy  Not often - last was 2 weeks ago  Spinning sensation / positional  Will last all day then better the next day    HTN bp is stable today  No cp or palpitations or headaches or edema  No side effects to medicines  BP Readings from Last 3 Encounters:  03/30/24 120/80  02/29/24 130/70  02/12/24 (!) 142/86     Lab Results  Component Value Date   NA 141 12/20/2023   K 4.3 12/20/2023   CO2 26 12/20/2023   GLUCOSE 249 (H) 12/20/2023   BUN 11 12/20/2023   CREATININE 0.79 12/20/2023   CALCIUM  9.6 12/20/2023   GFR 82.67 09/28/2023   GFRNONAA >60 12/20/2023   Losartan  50 mg daily    DM2 Diabetes Home sugar results   DM diet   Exercise   Metformin  1000 mg bid  Trulicity  1.5 mg weekly Jardiance  10 mg daily   On arb and statin  Sees pharmacist as well    Lab Results  Component Value Date   HGBA1C 6.7 (A) 03/30/2024   HGBA1C 7.2 (A) 12/29/2023   HGBA1C 8.0 (A) 09/28/2023   No results found for: LABMICR, MICROALBUR  Renal protection- arb  and jardiance  Last eye exam 06/2023     Hyperlipidemia Lab Results  Component Value Date   CHOL 143 09/28/2023   HDL 42.40 09/28/2023   LDLCALC 64 09/28/2023   LDLDIRECT 111.0 08/19/2019   TRIG 184.0 (H) 09/28/2023   CHOLHDL 3 09/28/2023   Well controlled  Atorvastatin  40 mg daily   Lab Results  Component Value Date   ALT 21 12/20/2023   AST 36 12/20/2023   ALKPHOS 107 12/20/2023   BILITOT 1.4 (H) 12/20/2023     Fibrosis 4 Score = 3.34 (High risk)        Interpretation for patients with NAFLD          <1.30       -  F0-F1 (Low risk)          1.30-2.67 -  Indeterminate           >2.67      -  F3-F4 (High risk)      Validated for ages 16-65         Patient Active Problem List   Diagnosis Date Noted   Hordeolum externum of left lower eyelid 03/30/2024   Dizziness 03/30/2024   Fatty liver 03/30/2024  Constipation 12/29/2023   Abnormal CT of liver 12/29/2023   Splenomegaly 12/29/2023   Encounter for hepatitis C screening test for low risk patient 09/28/2023   Encounter for screening for HIV 09/28/2023   Long-term current use of injectable noninsulin antidiabetic medication 03/25/2023   Complex sclerosing lesion of right breast 01/15/2023   History of colonic polyps    Polyp of cecum    Yeast dermatitis 10/15/2021   Encounter for screening mammogram for breast cancer 10/04/2021   Hyperlipidemia associated with type 2 diabetes mellitus (HCC) 08/17/2018   Hypertrophic toenail 02/02/2018   Anxiety disorder 09/12/2015   Low HDL (under 40) 10/24/2014   Routine general medical examination at a health care facility 03/08/2014   History of endometrial cancer 12/15/2012   Colon cancer screening 11/01/2012   DM type 2 (diabetes mellitus, type 2) (HCC) 06/30/2012   Varicose veins of leg with swelling, left 11/18/2011   Class 2 obesity due to excess calories with body mass index (BMI) of 35.0 to 35.9 in adult 11/12/2010   Essential hypertension 07/12/2009   Hypothyroidism  (acquired) 05/16/2009   Tremor 04/10/2009   Past Medical History:  Diagnosis Date   Anxiety    Breast lesion    benign   Diabetes mellitus without complication (HCC)    Elevated transaminase level    ? fatty liver    History of endometrial cancer 12/15/2012   FIGO Grade 2 endometrioid adenocarcinoma with squamous differentiation involving the mucosa only.  From SGO Guidelines:  Recommended surveillance after treatment of endometrial cancer includes a follow-up visit every 3-6 months for 2 years, then every 6 months for 3 years, and annually thereafter. Each follow-up visit should include a thorough patient history; elicitation and investigation of any new symptoms associated with recurrence, such as vaginal bleeding, pelvic pain, weight loss, or lethargy; and a thorough speculum, pelvic, and rectovaginal examination   HTN (hypertension)    Hypothyroid    UTI (urinary tract infection)    Past Surgical History:  Procedure Laterality Date   ABDOMINAL HYSTERECTOMY     BREAST BIOPSY     benign cyst 2011   BREAST BIOPSY Right 01/06/2023   US  RT BREAST BX W LOC DEV 1ST LESION IMG BX SPEC US  GUIDE 01/06/2023 [facility]   CATARACT EXTRACTION W/PHACO Right 01/07/2022   Procedure: CATARACT EXTRACTION PHACO AND INTRAOCULAR LENS PLACEMENT (IOC) RIGHT DIABETIC 8.04 01:01.7;  Surgeon: Jaye Fallow, MD;  Location: [facility];  Service: Ophthalmology;  Laterality: Right;  Diabetic   CATARACT EXTRACTION W/PHACO Left 10/07/2022   Procedure: CATARACT EXTRACTION PHACO AND INTRAOCULAR LENS PLACEMENT (IOC) LEFT DIABETIC 4.93 00:40.7;  Surgeon: Jaye Fallow, MD;  Location: [facility];  Service: Ophthalmology;  Laterality: Left;  Diabetic   CESAREAN SECTION     COLONOSCOPY WITH PROPOFOL  N/A 11/20/2021   Procedure: COLONOSCOPY WITH PROPOFOL ;  Surgeon: Unk Corinn Skiff, MD;  Location: [facility];  Service: Gastroenterology;  Laterality: N/A;   ESOPHAGOGASTRODUODENOSCOPY N/A  02/12/2024   Procedure: EGD (ESOPHAGOGASTRODUODENOSCOPY);  Surgeon: Unk Corinn Skiff, MD;  Location: [facility] ENDOSCOPY;  Service: Gastroenterology;  Laterality: N/A;  DM   INCISION AND DRAINAGE ABSCESS Right 08/16/2021   Procedure: INCISION AND DRAINAGE ABDOMINAL WALL ABSCESS;  Surgeon: Lane Shope, MD;  Location: [facility];  Service: General;  Laterality: Right;   LAPAROSCOPIC TOTAL HYSTERECTOMY  11/2012   Social History   Tobacco Use   Smoking status: Former    Current packs/day: 0.00    Types: Cigarettes    Quit date:  07/22/1979    Years since quitting: 44.7    Passive exposure: Never   Smokeless tobacco: Never   Tobacco comments:    briefly smoked at 16.  (May occasionally have 1 cig)  Vaping Use   Vaping status: Never Used  Substance Use Topics   Alcohol use: Not Currently    Comment: rarely   Drug use: No   Family History  Problem Relation Age of Onset   Hypothyroidism Mother    Alzheimer's disease Mother    Stroke Father    Deep vein thrombosis Sister    Colon cancer Neg Hx    Esophageal cancer Neg Hx    Stomach cancer Neg Hx    Rectal cancer Neg Hx    Breast cancer Neg Hx    No Known Allergies Current Outpatient Medications on File Prior to Visit  Medication Sig Dispense Refill   Accu-Chek Softclix Lancets lancets Check blood sugar once daily. (Dx. E11.9) 100 each 0   Alcohol Swabs (B-D SINGLE USE SWABS REGULAR) PADS Use when checking blood sugar once daily (dx. E11.9) 100 each 0   atorvastatin  (LIPITOR) 40 MG tablet Take 1 tablet (40 mg total) by mouth daily. 90 tablet 0   Blood Glucose Monitoring Suppl (ACCU-CHEK GUIDE) w/Device KIT Use to check blood sugar once daily (dx. E11.9) 1 kit 0   clotrimazole  (LOTRIMIN ) 1 % cream APPLY 1 APPLICATION. TOPICALLY 2 (TWO) TIMES DAILY. 30 g 0   dicyclomine  (BENTYL ) 20 MG tablet Take 1 tablet (20 mg total) by mouth every 6 (six) hours. 20 tablet 0   Dulaglutide  (TRULICITY ) 1.5 MG/0.5ML SOAJ Inject 1.5 mg as directed once a  week. 6 mL 0   empagliflozin  (JARDIANCE ) 10 MG TABS tablet Take 1 tablet (10 mg total) by mouth daily. 90 tablet 0   glucose blood (ACCU-CHEK GUIDE TEST) test strip Use to check blood sugar once daily (dx. E11.9) 100 each 0   levothyroxine  (SYNTHROID ) 100 MCG tablet Take 1 tablet (100 mcg total) by mouth daily. 90 tablet 1   losartan  (COZAAR ) 100 MG tablet Take 0.5 tablets (50 mg total) by mouth daily. 45 tablet 0   metFORMIN  (GLUCOPHAGE ) 1000 MG tablet TAKE 1 TABLET (1,000 MG TOTAL) BY MOUTH TWICE A DAY WITH FOOD 180 tablet 0   No current facility-administered medications on file prior to visit.    Review of Systems  Constitutional:  Negative for activity change, appetite change, fatigue, fever and unexpected weight change.  HENT:  Negative for congestion, ear pain, rhinorrhea, sinus pressure and sore throat.   Eyes:  Negative for pain, redness and visual disturbance.       Stye  Respiratory:  Negative for cough, shortness of breath and wheezing.   Cardiovascular:  Negative for chest pain and palpitations.  Gastrointestinal:  Positive for nausea. Negative for abdominal pain, blood in stool, constipation and diarrhea.       Occational nausea Not today  Endocrine: Negative for polydipsia and polyuria.  Genitourinary:  Negative for dysuria, frequency and urgency.  Musculoskeletal:  Negative for arthralgias, back pain and myalgias.  Skin:  Negative for pallor and rash.  Allergic/Immunologic: Negative for environmental allergies.  Neurological:  Positive for dizziness. Negative for syncope and headaches.       Occational dizziness Not today  Hematological:  Negative for adenopathy. Does not bruise/bleed easily.  Psychiatric/Behavioral:  Negative for decreased concentration and dysphoric mood. The patient is not nervous/anxious.        Objective:   Physical Exam  Constitutional:      General: She is not in acute distress.    Appearance: Normal appearance. She is well-developed. She is  not ill-appearing or diaphoretic.  HENT:     Head: Normocephalic and atraumatic.  Eyes:     Conjunctiva/sclera: Conjunctivae normal.     Pupils: Pupils are equal, round, and reactive to light.     Comments: No nystagmus   Hordeolum noted left lower lid  Mild erythema/ mildly tender and no drainage   Neck:     Thyroid : No thyromegaly.     Vascular: No carotid bruit or JVD.  Cardiovascular:     Rate and Rhythm: Normal rate and regular rhythm.     Heart sounds: Normal heart sounds.     No gallop.  Pulmonary:     Effort: Pulmonary effort is normal. No respiratory distress.     Breath sounds: Normal breath sounds. No wheezing or rales.  Abdominal:     General: There is no distension or abdominal bruit.     Palpations: Abdomen is soft.  Musculoskeletal:     Cervical back: Normal range of motion and neck supple.     Right lower leg: No edema.     Left lower leg: No edema.  Lymphadenopathy:     Cervical: No cervical adenopathy.  Skin:    General: Skin is warm and dry.     Coloration: Skin is not pale.     Findings: No rash.  Neurological:     Mental Status: She is alert.     Cranial Nerves: No cranial nerve deficit.     Sensory: No sensory deficit.     Motor: No weakness, tremor, atrophy or abnormal muscle tone.     Coordination: Romberg sign negative. Coordination normal.     Gait: Gait normal.     Deep Tendon Reflexes: Reflexes are normal and symmetric. Reflexes normal.  Psychiatric:        Mood and Affect: Mood normal.           Assessment & Plan:   Problem List Items Addressed This Visit       Cardiovascular and Mediastinum   Essential hypertension   bp in fair control at this time  BP Readings from Last 1 Encounters:  03/30/24 120/80   No changes needed Most recent labs reviewed  Disc lifstyle change with low sodium diet and exercise  Plan to continue losartan  50 mg daily           Digestive   Fatty liver   Had recent liver bx confirming cirrhosis   Pt has liver/GI follow up planned Continues to loose weight with diet and exercise  Commended         Endocrine   Hyperlipidemia associated with type 2 diabetes mellitus (HCC)   Lab today  Disc goals for lipids and reasons to control them Rev last labs with pt Rev low sat fat diet in detail  Atorvastatin  40 mg daily  Diet is improving  Last LDL under 70      DM type 2 (diabetes mellitus, type 2) (HCC) - Primary   Lab Results  Component Value Date   HGBA1C 6.7 (A) 03/30/2024   HGBA1C 7.2 (A) 12/29/2023   HGBA1C 8.0 (A) 09/28/2023   Metformin  100 mg bid Jardiance  10 mg daily  Trulicity  1.5 mg weekly (? If adding to nausea)  On arb and statin  Eating better  Commended  U micro today   Follow up 3-4 mo  Relevant Orders   POCT HgB A1C (Completed)   Microalbumin / creatinine urine ratio     Other   Hordeolum externum of left lower eyelid   Right lower eyelid Prone to these Recommend keeping clean and use of warm compress Update if not starting to improve in a week or if worsening  Call back and Er precautions noted in detail today        Dizziness   Several episodes since her liver bx Spinning feeling with nausea  Per history -possible vertigo Resolved now  Reassuring exam Encouraged pt to call if this re occurs

## 2024-03-30 NOTE — Assessment & Plan Note (Signed)
 Lab Results  Component Value Date   HGBA1C 6.7 (A) 03/30/2024   HGBA1C 7.2 (A) 12/29/2023   HGBA1C 8.0 (A) 09/28/2023   Metformin  100 mg bid Jardiance  10 mg daily  Trulicity  1.5 mg weekly (? If adding to nausea)  On arb and statin  Eating better  Commended  U micro today   Follow up 3-4 mo

## 2024-03-30 NOTE — Assessment & Plan Note (Signed)
 Several episodes since her liver bx Spinning feeling with nausea  Per history -possible vertigo Resolved now  Reassuring exam Encouraged pt to call if this re occurs

## 2024-03-30 NOTE — Patient Instructions (Addendum)
 Your diabetes continues to improve A1c is 6.7 Good  Keep up the good work   Keep walking  Add some strength training to your routine, this is important for bone and brain health and can reduce your risk of falls and help your body use insulin  properly and regulate weight  Light weights, exercise bands , and internet videos are a good way to start  Yoga (chair or regular), machines , floor exercises or a gym with machines are also good options  This is important to help metabolism and also to prevent muscle loss   Stay very hydrated  You may have had some vertigo Let us  know if it keeps coming back  Trulicity  can cause nausea so we will watch that   Follow up in 3-4   Keep the stye area clean Warm compresses help   Urine protein test today

## 2024-03-30 NOTE — Assessment & Plan Note (Signed)
 Lab today  Disc goals for lipids and reasons to control them Rev last labs with pt Rev low sat fat diet in detail  Atorvastatin  40 mg daily  Diet is improving  Last LDL under 70

## 2024-04-11 ENCOUNTER — Encounter: Payer: Self-pay | Admitting: Podiatry

## 2024-04-11 ENCOUNTER — Ambulatory Visit (INDEPENDENT_AMBULATORY_CARE_PROVIDER_SITE_OTHER): Admitting: Podiatry

## 2024-04-11 DIAGNOSIS — E119 Type 2 diabetes mellitus without complications: Secondary | ICD-10-CM | POA: Diagnosis not present

## 2024-04-11 DIAGNOSIS — B351 Tinea unguium: Secondary | ICD-10-CM | POA: Diagnosis not present

## 2024-04-11 DIAGNOSIS — M79675 Pain in left toe(s): Secondary | ICD-10-CM | POA: Diagnosis not present

## 2024-04-11 DIAGNOSIS — M79674 Pain in right toe(s): Secondary | ICD-10-CM | POA: Diagnosis not present

## 2024-04-11 NOTE — Progress Notes (Signed)
  Subjective:  Patient ID: Taylor Grimes, female    DOB: 1961-05-03,  MRN: [id number]  Zada GORMAN Sandy presents to clinic today for preventative diabetic foot care for painful thick toenails that are difficult to trim. Pain interferes with ambulation. Aggravating factors include wearing enclosed shoe gear. Pain is relieved with periodic professional debridement.  Chief Complaint  Patient presents with   Diabetes    DFC NIDDM A1C 6.7. Toenail trim. LOV wth PCP 03/30/24.   New problem(s): None.   PCP is Tower, Laine LABOR, MD.  No Known Allergies  Review of Systems: Negative except as noted in the HPI.  Objective: No changes noted in today's physical examination. There were no vitals filed for this visit. Taylor Grimes is a pleasant 63 y.o. female WD, WN in NAD. AAO x 3.  Vascular Examination: Capillary refill time immediate b/l. Vascular status intact b/l with palpable pedal pulses. Pedal hair present b/l. No pain with calf compression b/l. Skin temperature gradient WNL b/l. No cyanosis or clubbing b/l. No ischemia or gangrene noted b/l.   Neurological Examination: Sensation grossly intact b/l with 10 gram monofilament. Vibratory sensation intact b/l.   Dermatological Examination: Pedal skin with normal turgor, texture and tone b/l.  No open wounds. No interdigital macerations.   Toenails 1-5 b/l thick, discolored, elongated with subungual debris and pain on dorsal palpation.   Minimal hyperkeratos(is/es) noted submet head 1 left foot and submet head 4 left foot.  Musculoskeletal Examination: Muscle strength 5/5 to all lower extremity muscle groups bilaterally. No pain, crepitus or joint limitation noted with ROM bilateral LE. No gross bony deformities bilaterally.  Radiographs: None  Assessment/Plan: 1. Pain due to onychomycosis of toenails of both feet   2. Diabetes mellitus without complication [facility])    Patient was evaluated and treated. All patient's and/or POA's  questions/concerns addressed on today's visit. Mycotic toenails 1-5 debrided in length and girth without incident.  As a courtesy, minimal hyperkeratosis gently filed with dremel. Continue daily foot inspections and monitor blood glucose per PCP/Endocrinologist's recommendations.Continue soft, supportive shoe gear daily. Report any pedal injuries to medical professional. Call office if there are any quesitons/concerns. -Patient/POA to call should there be question/concern in the interim.   Return in about 3 months (around 07/11/2024).  Taylor Grimes, DPM      [place] LOCATION: [address] [place] [address]                   Office [phone]   [facility] LOCATION: [address] [place], [place] [address] Office [phone]

## 2024-05-04 ENCOUNTER — Other Ambulatory Visit: Payer: Self-pay | Admitting: Family Medicine

## 2024-05-04 DIAGNOSIS — E1169 Type 2 diabetes mellitus with other specified complication: Secondary | ICD-10-CM

## 2024-05-04 DIAGNOSIS — E119 Type 2 diabetes mellitus without complications: Secondary | ICD-10-CM

## 2024-05-05 NOTE — Telephone Encounter (Signed)
 Trulicity  Last filled:  02/29/24, #6 mL Last OV:  03/30/24, 3 mo DM and chronic conditions f/u Next OV:  07/25/24, 4 mo f/u

## 2024-05-13 ENCOUNTER — Other Ambulatory Visit: Payer: Self-pay | Admitting: Family Medicine

## 2024-06-15 ENCOUNTER — Other Ambulatory Visit: Payer: Self-pay | Admitting: Family Medicine

## 2024-06-27 DIAGNOSIS — K7469 Other cirrhosis of liver: Secondary | ICD-10-CM | POA: Diagnosis not present

## 2024-06-27 DIAGNOSIS — K7581 Nonalcoholic steatohepatitis (NASH): Secondary | ICD-10-CM | POA: Diagnosis not present

## 2024-07-04 ENCOUNTER — Ambulatory Visit (INDEPENDENT_AMBULATORY_CARE_PROVIDER_SITE_OTHER): Payer: 59 | Admitting: Internal Medicine

## 2024-07-04 ENCOUNTER — Encounter: Payer: Self-pay | Admitting: Internal Medicine

## 2024-07-04 ENCOUNTER — Other Ambulatory Visit

## 2024-07-04 VITALS — BP 158/100 | HR 93 | Ht 61.0 in | Wt 160.0 lb

## 2024-07-04 DIAGNOSIS — E039 Hypothyroidism, unspecified: Secondary | ICD-10-CM

## 2024-07-04 NOTE — Patient Instructions (Signed)
  Please continue Levothyroxine 100 mcg daily.  Take the thyroid hormone every day, with water, at least 30 minutes before breakfast, separated by at least 4 hours from: - acid reflux medications - calcium - iron - multivitamins  Please stop at the lab.  Please come back for a follow-up appointment in 1 year. 

## 2024-07-04 NOTE — Progress Notes (Signed)
 Patient ID: Taylor Grimes, female   DOB: [birth year], 63 y.o.   MRN: [id number]  HPI  Taylor Grimes is a 63 y.o.-year-old femalemale, returning for f/u for hypothyroidism, dx 2012. Last visit 1 year ago. Her sister, Taylor Grimes, was also my pt. - she now moved to the [facility].  Interim hx.: She denies dysphagia or hoarseness, but she does have some choking occasionally. Before last 2 visits, she lost a significant amount of weight on Trulicity  (33 pounds).  Since last visit, she lost another 23 pounds. She is not eating processed foods, she also reduced portions, reduced sodium.  She is followed at [facility] for her cirrhosis.  She has her imaging every 6 months.  Pt is on levothyroxine  100  mcg daily, taken: - in am - fasting - at least 30 min from b'fast - + calcium  in the evening, + occasional Tums - no iron - no multivitamins - no PPIs - not on Biotin  TFTs reviewed: Lab Results  Component Value Date   TSH 0.44 09/28/2023   TSH 0.13 (L) 07/03/2023   TSH 0.43 09/15/2022   TSH 0.60 06/26/2022   TSH 0.37 03/17/2022   TSH 0.28 (L) 12/05/2021   TSH 0.08 (L) 09/06/2021   TSH 0.39 06/27/2021   TSH 0.42 09/07/2020   TSH 0.52 06/21/2020   FREET4 1.9 (H) 07/03/2023   FREET4 1.34 06/26/2022   FREET4 1.59 03/17/2022   FREET4 1.57 12/05/2021   FREET4 1.58 06/27/2021   FREET4 1.25 06/21/2020   FREET4 1.45 03/10/2019   FREET4 1.01 03/08/2018   FREET4 1.15 02/24/2017   FREET4 1.44 01/14/2016   Pt denies: - feeling nodules in neck - dysphagia In the past, she was waking up at 3 am to go to work and open [employer] at 4-7 am.  She limited her work days to 32 h per week. Her fatigue improved but improved even more significantly after retiring. She has choking - occasionally.  No FH of thyroid  cancer. However, all women in her family: hypothyroidism. No FH of thyroid  cancer. No h/o radiation tx to head or neck. No herbal supplements. No Biotin use. No recent steroids use.   ROS: +  see HPI  I reviewed pt's medications, allergies, PMH, social hx, family hx, and changes were documented in the history of present illness. Otherwise, unchanged from my initial visit note.  Past Medical History:  Diagnosis Date   Anxiety    Breast lesion    benign   Diabetes mellitus without complication (HCC)    Elevated transaminase level    ? fatty liver    History of endometrial cancer 12/15/2012   FIGO Grade 2 endometrioid adenocarcinoma with squamous differentiation involving the mucosa only.  From SGO Guidelines:  Recommended surveillance after treatment of endometrial cancer includes a follow-up visit every 3-6 months for 2 years, then every 6 months for 3 years, and annually thereafter. Each follow-up visit should include a thorough patient history; elicitation and investigation of any new symptoms associated with recurrence, such as vaginal bleeding, pelvic pain, weight loss, or lethargy; and a thorough speculum, pelvic, and rectovaginal examination   HTN (hypertension)    Hypothyroid    UTI (urinary tract infection)    Past Surgical History:  Procedure Laterality Date   ABDOMINAL HYSTERECTOMY     BREAST BIOPSY     benign cyst 2011   BREAST BIOPSY Right 01/06/2023   US  RT BREAST BX W LOC DEV 1ST LESION IMG BX SPEC  US  GUIDE 01/06/2023 [facility]   CATARACT EXTRACTION W/PHACO Right 01/07/2022   Procedure: CATARACT EXTRACTION PHACO AND INTRAOCULAR LENS PLACEMENT (IOC) RIGHT DIABETIC 8.04 01:01.7;  Surgeon: Taylor Fallow, MD;  Location: [facility];  Service: Ophthalmology;  Laterality: Right;  Diabetic   CATARACT EXTRACTION W/PHACO Left 10/07/2022   Procedure: CATARACT EXTRACTION PHACO AND INTRAOCULAR LENS PLACEMENT (IOC) LEFT DIABETIC 4.93 00:40.7;  Surgeon: Taylor Fallow, MD;  Location: [facility];  Service: Ophthalmology;  Laterality: Left;  Diabetic   CESAREAN SECTION     COLONOSCOPY WITH PROPOFOL  N/A 11/20/2021   Procedure: COLONOSCOPY WITH PROPOFOL ;   Surgeon: Taylor Corinn Skiff, MD;  Location: [facility];  Service: Gastroenterology;  Laterality: N/A;   ESOPHAGOGASTRODUODENOSCOPY N/A 02/12/2024   Procedure: EGD (ESOPHAGOGASTRODUODENOSCOPY);  Surgeon: Taylor Corinn Skiff, MD;  Location: [facility] END[facility];  Service: Gastroenterology;  Laterality: N/A;  DM   INCISION AND DRAINAGE ABSCESS Right 08/16/2021   Procedure: INCISION AND DRAINAGE ABDOMINAL WALL ABSCESS;  Surgeon: Taylor Shope, MD;  Location: [facility];  Service: General;  Laterality: Right;   LAPAROSCOPIC TOTAL HYSTERECTOMY  11/2012   Social History   Socioeconomic History   Marital status: Legally Separated    Spouse name: Not on file   Number of children: 2   Years of education: Not on file   Highest education level: 12th grade  Occupational History   Occupation: shift Clinical Cytogeneticist: [employer]  Tobacco Use   Smoking status: Former    Current packs/day: 0.00    Types: Cigarettes    Quit date: 07/22/1979    Years since quitting: 44.9    Passive exposure: Never   Smokeless tobacco: Never   Tobacco comments:    briefly smoked at 16.  (May occasionally have 1 cig)  Vaping Use   Vaping status: Never Used  Substance and Sexual Activity   Alcohol use: Not Currently    Comment: rarely   Drug use: No   Sexual activity: Yes    Birth control/protection: Post-menopausal  Other Topics Concern   Not on file  Social History Narrative   Not on file   Social Drivers of Health   Tobacco Use: Medium Risk (04/11/2024)   Patient History    Smoking Tobacco Use: Former    Smokeless Tobacco Use: Never    Passive Exposure: Never  Physicist, Medical Strain: Low Risk (03/26/2024)   Overall Financial Resource Strain (CARDIA)    Difficulty of Paying Living Expenses: Not very hard  Food Insecurity: No Food Insecurity (03/26/2024)   Epic    Worried About Radiation Protection Practitioner of Food in the Last Year: Never true    Ran Out of Food in the Last Year: Never true  Transportation  Needs: No Transportation Needs (03/26/2024)   Epic    Lack of Transportation (Medical): No    Lack of Transportation (Non-Medical): No  Physical Activity: Insufficiently Active (03/26/2024)   Exercise Vital Sign    Days of Exercise per Week: 2 days    Minutes of Exercise per Session: 20 min  Stress: No Stress Concern Present (03/26/2024)   [employer] of Occupational Health - Occupational Stress Questionnaire    Feeling of Stress: Not at all  Social Connections: Socially Isolated (03/26/2024)   Social Connection and Isolation Panel    Frequency of Communication with Friends and Family: More than three times a week    Frequency of Social Gatherings with Friends and Family: Once a week    Attends Religious Services: Never  Active Member of Clubs or Organizations: No    Attends Banker Meetings: Not on file    Marital Status: Divorced  Intimate Partner Violence: Not on file  Depression [phone]): Low Risk (03/30/2024)   Depression (PHQ2-9)    PHQ-2 Score: 0  Alcohol Screen: Low Risk (09/24/2023)   Alcohol Screen    Last Alcohol Screening Score (AUDIT): 1  Housing: Unknown (03/26/2024)   Epic    Unable to Pay for Housing in the Last Year: No    Number of Times Moved in the Last Year: Not on file    Homeless in the Last Year: No  Utilities: Not At Risk (09/22/2022)   AHC Utilities    Threatened with loss of utilities: No  Health Literacy: Not on file   Current Outpatient Medications on File Prior to Visit  Medication Sig Dispense Refill   Accu-Chek Softclix Lancets lancets USE AS INSTRUCTED TO TEST BLOOD SUGAR ONE TIME DAILY 100 each 3   Alcohol Swabs (DROPSAFE ALCOHOL PREP) 70 % PADS USE WHEN CHECKING BLOOD SUGAR ONCE DAILY 100 each 3   atorvastatin  (LIPITOR) 40 MG tablet TAKE 1 TABLET EVERY DAY 90 tablet 0   Blood Glucose Monitoring Suppl (ACCU-CHEK GUIDE) w/Device KIT Use to check blood sugar once daily (dx. E11.9) 1 kit 0   clotrimazole  (LOTRIMIN ) 1 % cream APPLY 1  APPLICATION. TOPICALLY 2 (TWO) TIMES DAILY. 30 g 0   dicyclomine  (BENTYL ) 20 MG tablet Take 1 tablet (20 mg total) by mouth every 6 (six) hours. 20 tablet 0   Dulaglutide  (TRULICITY ) 1.5 MG/0.5ML SOAJ INJECT 1.5MG  (1 PEN) UNDER THE SKIN EVERY WEEK AS DIRECTED 6 mL 1   empagliflozin  (JARDIANCE ) 10 MG TABS tablet Take 1 tablet (10 mg total) by mouth daily. 90 tablet 0   glucose blood (ACCU-CHEK GUIDE TEST) test strip USE AS INSTRUCTED TO TEST BLOOD SUGAR ONE TIME DAILY 100 strip 3   levothyroxine  (SYNTHROID ) 100 MCG tablet Take 1 tablet (100 mcg total) by mouth daily. 90 tablet 1   losartan  (COZAAR ) 100 MG tablet TAKE 1/2 TABLET EVERY DAY 45 tablet 0   metFORMIN  (GLUCOPHAGE ) 1000 MG tablet TAKE 1 TABLET TWICE DAILY WITH FOOD 180 tablet 0   No current facility-administered medications on file prior to visit.   No Known Allergies Family History  Problem Relation Age of Onset   Hypothyroidism Mother    Alzheimer's disease Mother    Stroke Father    Deep vein thrombosis Sister    Colon cancer Neg Hx    Esophageal cancer Neg Hx    Stomach cancer Neg Hx    Rectal cancer Neg Hx    Breast cancer Neg Hx    PE: BP (!) 158/100 Comment: no medicaiton  Pulse 93   Ht 5' 1 (1.549 m)   Wt 160 lb (72.6 kg)   LMP 07/21/2008   SpO2 99%   BMI 30.23 kg/m  Wt Readings from Last 20 Encounters:  07/04/24 160 lb (72.6 kg)  03/30/24 162 lb 12.8 oz (73.8 kg)  02/29/24 160 lb (72.6 kg)  02/12/24 166 lb 3.2 oz (75.4 kg)  02/11/24 165 lb (74.8 kg)  12/29/23 168 lb (76.2 kg)  12/20/23 165 lb (74.8 kg)  09/28/23 177 lb 2 oz (80.3 kg)  09/07/23 179 lb 3.2 oz (81.3 kg)  08/18/23 179 lb 3.2 oz (81.3 kg)  07/03/23 183 lb 12.8 oz (83.4 kg)  06/29/23 184 lb 2 oz (83.5 kg)  03/25/23 188 lb 12.8 oz (  85.6 kg)  01/15/23 190 lb 12.8 oz (86.5 kg)  12/22/22 193 lb (87.5 kg)  11/04/22 190 lb (86.2 kg)  10/07/22 196 lb (88.9 kg)  09/22/22 196 lb 6 oz (89.1 kg)  06/26/22 201 lb 9.6 oz (91.4 kg)  06/23/22 201 lb  (91.2 kg)   Constitutional: overweight, in NAD Eyes:  EOMI, no exophthalmos ENT: no neck masses, no cervical lymphadenopathy Cardiovascular: RRR, No MRG Respiratory: CTA B Musculoskeletal: no deformities Skin:no rashes Neurological: +  tremor with outstretched hands L>R  ASSESSMENT: 1. Hypothyroidism  PLAN:  1. Patient with acquired hypothyroidism, on levothyroxine  - latest thyroid  labs reviewed with pt. >> normal: Lab Results  Component Value Date   TSH 0.44 09/28/2023  - she continues on LT4 100 mcg daily, dose decreased at last visit - pt feels good on this dose.  Before the last 2 visits, she lost 33 pounds.  We discussed that with continuing weight loss, we may need to reduce the dose of her LT4.  At that time, we reduced the dose from 112 to 100 mcg daily as her TSH was suppressed.  She lost another 23 pounds since then.  We discussed that we may need to continue to decrease the dose of levothyroxine  due to the weight loss. - she does have some choking but no thyroid  masses are felt on palpation. - we discussed about taking the thyroid  hormone every day, with water, >30 minutes before breakfast, separated by >4 hours from acid reflux medications, calcium , iron, multivitamins. Pt. is taking it correctly. - will check thyroid  tests today: TSH and fT4 - If labs are abnormal, she will need to return for repeat TFTs in 1.5 months - I will see her back in a year or possibly sooner for labs.  Needs refills - [facility].  Orders Placed This Encounter  Procedures   TSH   T4, free   Lela Fendt, MD PhD [facility] Endocrinology

## 2024-07-05 ENCOUNTER — Ambulatory Visit: Payer: Self-pay | Admitting: Internal Medicine

## 2024-07-05 LAB — TSH: TSH: 0.34 m[IU]/L — ABNORMAL LOW (ref 0.40–4.50)

## 2024-07-05 LAB — T4, FREE: Free T4: 1.8 ng/dL (ref 0.8–1.8)

## 2024-07-05 MED ORDER — LEVOTHYROXINE SODIUM 88 MCG PO TABS: 88.0000 ug | ORAL_TABLET | Freq: Every day | ORAL | 3 refills | Status: AC

## 2024-07-05 NOTE — Addendum Note (Signed)
 Addended by: TRIXIE FILE on: 07/05/2024 01:19 PM   Modules accepted: Orders

## 2024-07-06 NOTE — Telephone Encounter (Signed)
Orders have been placed and faxed

## 2024-07-06 NOTE — Addendum Note (Signed)
 Addended by: CLEOTILDE ROLIN RAMAN on: 07/06/2024 10:46 AM   Modules accepted: Orders

## 2024-07-11 ENCOUNTER — Ambulatory Visit: Admitting: Podiatry

## 2024-07-11 ENCOUNTER — Encounter: Payer: Self-pay | Admitting: Podiatry

## 2024-07-11 DIAGNOSIS — B351 Tinea unguium: Secondary | ICD-10-CM

## 2024-07-11 DIAGNOSIS — E119 Type 2 diabetes mellitus without complications: Secondary | ICD-10-CM | POA: Diagnosis not present

## 2024-07-11 DIAGNOSIS — M79675 Pain in left toe(s): Secondary | ICD-10-CM | POA: Diagnosis not present

## 2024-07-11 DIAGNOSIS — M79674 Pain in right toe(s): Secondary | ICD-10-CM | POA: Diagnosis not present

## 2024-07-11 NOTE — Progress Notes (Signed)
"  °  Subjective:  Patient ID: Taylor Grimes, female    DOB: 04-02-1961,  MRN: [id number]  Taylor Grimes presents to clinic today for preventative diabetic foot care for painful mycotic toenails of both feet that are difficult to trim. Pain interferes with daily activities and wearing enclosed shoe gear comfortably.  Patient states she will be traveling to [facility] with her son for his birthday. Chief Complaint  Patient presents with   Diabetes    A1c 6.7 She has an appointment to see Dr. Randeen in two weeks   New problem(s): None.   PCP is Tower, Laine LABOR, MD.  Allergies[1]  Review of Systems: Negative except as noted in the HPI.  Objective: No changes noted in today's physical examination. There were no vitals filed for this visit. Taylor Grimes is a pleasant 63 y.o. female WD, WN in NAD. AAO x 3.  Vascular Examination: Capillary refill time immediate b/l. Vascular status intact b/l with palpable pedal pulses. Pedal hair present b/l. No pain with calf compression b/l. Skin temperature gradient WNL b/l. No cyanosis or clubbing b/l. No ischemia or gangrene noted b/l.   Neurological Examination: Sensation grossly intact b/l with 10 gram monofilament. Vibratory sensation intact b/l.   Dermatological Examination: Pedal skin with normal turgor, texture and tone b/l.  No open wounds. No interdigital macerations.   Toenails 1-5 b/l thick, discolored, elongated with subungual debris and pain on dorsal palpation.   Minimal hyperkeratos(is/es) noted submet head 1 left foot and submet head 4 left foot.  Musculoskeletal Examination: Muscle strength 5/5 to all lower extremity muscle groups bilaterally. No pain, crepitus or joint limitation noted with ROM bilateral LE. No gross bony deformities bilaterally.  Radiographs: None  Assessment/Plan: 1. Pain due to onychomycosis of toenails of both feet   2. Diabetes mellitus without complication [facility])   Consent given for treatment. Patient examined. All  patient's and/or POA's questions/concerns addressed on today's visit. Toenails 1-5 b/l debrided in length and girth without incident. Continue foot and shoe inspections daily. Monitor blood glucose per PCP/Endocrinologist's recommendations. Continue soft, supportive shoe gear daily. Report any pedal injuries to medical professional. Call office if there are any questions/concerns. -Patient/POA to call should there be question/concern in the interim.   Return in about 3 months (around 10/09/2024).  [name], DPM      [facility] LOCATION: [address], [place] [address]                   Office [phone]   [facility] LOCATION: [address] [place], [place] [address] Office [phone]     [1] No Known Allergies  "

## 2024-07-14 ENCOUNTER — Other Ambulatory Visit: Payer: Self-pay | Admitting: Family Medicine

## 2024-07-14 DIAGNOSIS — E119 Type 2 diabetes mellitus without complications: Secondary | ICD-10-CM

## 2024-07-14 DIAGNOSIS — E1169 Type 2 diabetes mellitus with other specified complication: Secondary | ICD-10-CM

## 2024-07-18 ENCOUNTER — Other Ambulatory Visit: Payer: Self-pay | Admitting: Family Medicine

## 2024-07-25 ENCOUNTER — Encounter: Payer: Self-pay | Admitting: Family Medicine

## 2024-07-25 ENCOUNTER — Ambulatory Visit: Admitting: Family Medicine

## 2024-07-25 ENCOUNTER — Ambulatory Visit: Payer: Self-pay | Admitting: Family Medicine

## 2024-07-25 VITALS — BP 120/78 | HR 74 | Temp 98.3°F | Ht 61.0 in | Wt 161.4 lb

## 2024-07-25 DIAGNOSIS — K7460 Unspecified cirrhosis of liver: Secondary | ICD-10-CM | POA: Insufficient documentation

## 2024-07-25 DIAGNOSIS — Z6830 Body mass index (BMI) 30.0-30.9, adult: Secondary | ICD-10-CM

## 2024-07-25 DIAGNOSIS — R161 Splenomegaly, not elsewhere classified: Secondary | ICD-10-CM

## 2024-07-25 DIAGNOSIS — E66811 Obesity, class 1: Secondary | ICD-10-CM

## 2024-07-25 DIAGNOSIS — I10 Essential (primary) hypertension: Secondary | ICD-10-CM | POA: Diagnosis not present

## 2024-07-25 DIAGNOSIS — E6609 Other obesity due to excess calories: Secondary | ICD-10-CM | POA: Diagnosis not present

## 2024-07-25 DIAGNOSIS — E785 Hyperlipidemia, unspecified: Secondary | ICD-10-CM

## 2024-07-25 DIAGNOSIS — K7581 Nonalcoholic steatohepatitis (NASH): Secondary | ICD-10-CM | POA: Diagnosis not present

## 2024-07-25 DIAGNOSIS — Z7985 Long-term (current) use of injectable non-insulin antidiabetic drugs: Secondary | ICD-10-CM | POA: Diagnosis not present

## 2024-07-25 DIAGNOSIS — E039 Hypothyroidism, unspecified: Secondary | ICD-10-CM | POA: Diagnosis not present

## 2024-07-25 DIAGNOSIS — E119 Type 2 diabetes mellitus without complications: Secondary | ICD-10-CM

## 2024-07-25 DIAGNOSIS — E1169 Type 2 diabetes mellitus with other specified complication: Secondary | ICD-10-CM | POA: Diagnosis not present

## 2024-07-25 LAB — LIPID PANEL
Cholesterol: 198 mg/dL (ref 28–200)
HDL: 37 mg/dL — ABNORMAL LOW
LDL Cholesterol: 114 mg/dL — ABNORMAL HIGH (ref 10–99)
NonHDL: 160.92
Total CHOL/HDL Ratio: 5
Triglycerides: 235 mg/dL — ABNORMAL HIGH (ref 10.0–149.0)
VLDL: 47 mg/dL — ABNORMAL HIGH (ref 0.0–40.0)

## 2024-07-25 LAB — BASIC METABOLIC PANEL WITH GFR
BUN: 12 mg/dL (ref 6–23)
CO2: 29 meq/L (ref 19–32)
Calcium: 9.7 mg/dL (ref 8.4–10.5)
Chloride: 104 meq/L (ref 96–112)
Creatinine, Ser: 0.72 mg/dL (ref 0.40–1.20)
GFR: 87.7 mL/min
Glucose, Bld: 111 mg/dL — ABNORMAL HIGH (ref 70–99)
Potassium: 3.9 meq/L (ref 3.5–5.1)
Sodium: 141 meq/L (ref 135–145)

## 2024-07-25 LAB — POCT GLYCOSYLATED HEMOGLOBIN (HGB A1C): Hemoglobin A1C: 6.8 % — AB (ref 4.0–5.6)

## 2024-07-25 NOTE — Assessment & Plan Note (Signed)
 bp in fair control at this time  BP Readings from Last 1 Encounters:  07/25/24 120/78   No changes needed Most recent labs reviewed  Disc lifstyle change with low sodium diet and exercise  Plan to continue losartan  50 mg daily

## 2024-07-25 NOTE — Assessment & Plan Note (Signed)
 Under GI care  With cirrhosis  Has us  upcoming  Next follow up in summer   Per last GI note: Compensated NASH cirrhosis of liver There are no signs or symptoms of decompensation Continue low-sodium diet Recommend right upper quadrant ultrasound for HCC surveillance in 07/2024 and every 6 months EGD revealed no evidence of varices or portal hypertensive gastropathy Has mild thrombocytopenia, no evidence of anemia PSC: None HRS: None

## 2024-07-25 NOTE — Patient Instructions (Addendum)
 When you check out  Schedule your welcome to medicare visit (this will be primarily with my medical assistant but I will see you briefly)  Then schedule your annual exam with me in person in 3 months    Labs today   Diabetes is stable Take care of yourself   Stay active  Strength training is most important  (I cannot keep you on trulicity  unless you do strength training 3 days per week)   Add some strength training to your routine, this is important for bone and brain health and can reduce your risk of falls and help your body use insulin  properly and regulate weight  Light weights, exercise bands , and internet videos are a good way to start  Yoga (chair or regular), machines , floor exercises or a gym with machines are also good options   Lab today for chem and cholesterol

## 2024-07-25 NOTE — Assessment & Plan Note (Signed)
 Lab Results  Component Value Date   HGBA1C 6.8 (A) 07/25/2024   HGBA1C 6.7 (A) 03/30/2024   HGBA1C 7.2 (A) 12/29/2023   Stable Metformin  1000 mg bid Jardiance  10 mg daily  Trulicity  1.5 mg weeky and tolerating  Will schedule her next eye exam   Follow up 3 mo (annual exam)

## 2024-07-25 NOTE — Assessment & Plan Note (Signed)
 Lab today  Disc goals for lipids and reasons to control them Rev last labs with pt Rev low sat fat diet in detail  Atorvastatin  40 mg daily  Diet is improving  Last LDL under 70/ last was 64   Lab today

## 2024-07-25 NOTE — Progress Notes (Signed)
 "  Subjective:    Patient ID: Taylor Grimes, female    DOB: 02-03-1961, 64 y.o.   MRN: [id number]  HPI  Wt Readings from Last 3 Encounters:  07/25/24 161 lb 6.4 oz (73.2 kg)  07/04/24 160 lb (72.6 kg)  03/30/24 162 lb 12.8 oz (73.8 kg)   30.50 kg/m  Vitals:   07/25/24 0853  BP: 120/78  Pulse: 74  Temp: 98.3 F (36.8 C)  SpO2: 98%   Feeling pretty good right now /occational vertigo    Pt presents for follow up of  DM2 HTN  Hyperlipidemia  NASH/ cirrhosis    HTN bp is stable today  No cp or palpitations or headaches or edema  No side effects to medicines  BP Readings from Last 3 Encounters:  07/25/24 120/78  07/04/24 (!) 158/100  03/30/24 120/80     Lab Results  Component Value Date   NA 141 12/20/2023   K 4.3 12/20/2023   CO2 26 12/20/2023   GLUCOSE 249 (H) 12/20/2023   BUN 11 12/20/2023   CREATININE 0.79 12/20/2023   CALCIUM  9.6 12/20/2023   GFR 82.67 09/28/2023   GFRNONAA >60 12/20/2023   Losartan  50 mg daily    DM2 Diabetes Home sugar results   DM diet  Working on this  Watching carbs and salt  Also avoids red meat   Exercise   A1c 6.8 today  Lab Results  Component Value Date   HGBA1C 6.8 (A) 07/25/2024   HGBA1C 6.7 (A) 03/30/2024   HGBA1C 7.2 (A) 12/29/2023   Lab Results  Component Value Date   MICROALBUR 2.1 (H) 03/30/2024    Renal protection arb, jardiance   Last eye exam -due soon/will schedule  Metformin  1000 mg bid Jardiance  10 mg daily  Truicity 1.5 mg weekly (tolerates well)    Hyperlipidemia Lab Results  Component Value Date   CHOL 143 09/28/2023   HDL 42.40 09/28/2023   LDLCALC 64 09/28/2023   LDLDIRECT 111.0 08/19/2019   TRIG 184.0 (H) 09/28/2023   CHOLHDL 3 09/28/2023   Atorvastatin  40 mg daily  LDL at goal   Under GI care for NASH  (Dr Unk at [facility])  Visit last mo and will follow up at 6 mo Per last note  Compensated NASH cirrhosis of liver There are no signs or symptoms of decompensation Continue  low-sodium diet Recommend right upper quadrant ultrasound for HCC surveillance in 07/2024 and every 6 months EGD revealed no evidence of varices or portal hypertensive gastropathy Has mild thrombocytopenia, no evidence of anemia PSC: None HRS: None   Getting US  done every 6 mo  Lab Results  Component Value Date   ALT 21 12/20/2023   AST 36 12/20/2023   ALKPHOS 107 12/20/2023   BILITOT 1.4 (H) 12/20/2023   Has lost weight     Patient Active Problem List   Diagnosis Date Noted   Cirrhosis of liver without ascites, unspecified hepatic cirrhosis type (HCC) 07/25/2024   Hordeolum externum of left lower eyelid 03/30/2024   Dizziness 03/30/2024   NASH (nonalcoholic steatohepatitis) 03/30/2024   Constipation 12/29/2023   Abnormal CT of liver 12/29/2023   Splenomegaly 12/29/2023   Long-term (current) use of injectable non-insulin  antidiabetic drugs 03/25/2023   Complex sclerosing lesion of right breast 01/15/2023   History of colonic polyps    Polyp of cecum    Yeast dermatitis 10/15/2021   Encounter for screening mammogram for breast cancer 10/04/2021   Hyperlipidemia associated with type 2 diabetes mellitus (HCC) 08/17/2018  Hypertrophic toenail 02/02/2018   Anxiety disorder 09/12/2015   Routine general medical examination at a health care facility 03/08/2014   History of endometrial cancer 12/15/2012   Colon cancer screening 11/01/2012   DM type 2 (diabetes mellitus, type 2) (HCC) 06/30/2012   Varicose veins of leg with swelling, left 11/18/2011   Class 1 obesity due to excess calories in adult 11/12/2010   Essential hypertension 07/12/2009   Hypothyroidism (acquired) 05/16/2009   Tremor 04/10/2009   Past Medical History:  Diagnosis Date   Anxiety    Breast lesion    benign   Diabetes mellitus without complication (HCC)    Elevated transaminase level    ? fatty liver    History of endometrial cancer 12/15/2012   FIGO Grade 2 endometrioid adenocarcinoma with squamous  differentiation involving the mucosa only.  From [facility] Guidelines:  Recommended surveillance after treatment of endometrial cancer includes a follow-up visit every 3-6 months for 2 years, then every 6 months for 3 years, and annually thereafter. Each follow-up visit should include a thorough patient history; elicitation and investigation of any new symptoms associated with recurrence, such as vaginal bleeding, pelvic pain, weight loss, or lethargy; and a thorough speculum, pelvic, and rectovaginal examination   HTN (hypertension)    Hypothyroid    UTI (urinary tract infection)    Past Surgical History:  Procedure Laterality Date   ABDOMINAL HYSTERECTOMY     BREAST BIOPSY     benign cyst 2011   BREAST BIOPSY Right 01/06/2023   US  RT BREAST BX W LOC DEV 1ST LESION IMG BX SPEC US  GUIDE 01/06/2023 [facility]   CATARACT EXTRACTION W/PHACO Right 01/07/2022   Procedure: CATARACT EXTRACTION PHACO AND INTRAOCULAR LENS PLACEMENT (IOC) RIGHT DIABETIC 8.04 01:01.7;  Surgeon: Jaye Fallow, MD;  Location: [facility];  Service: Ophthalmology;  Laterality: Right;  Diabetic   CATARACT EXTRACTION W/PHACO Left 10/07/2022   Procedure: CATARACT EXTRACTION PHACO AND INTRAOCULAR LENS PLACEMENT (IOC) LEFT DIABETIC 4.93 00:40.7;  Surgeon: Jaye Fallow, MD;  Location: [facility];  Service: Ophthalmology;  Laterality: Left;  Diabetic   CESAREAN SECTION     COLONOSCOPY WITH PROPOFOL  N/A 11/20/2021   Procedure: COLONOSCOPY WITH PROPOFOL ;  Surgeon: Unk Corinn Skiff, MD;  Location: [facility] ENDOSCOPY;  Service: Gastroenterology;  Laterality: N/A;   ESOPHAGOGASTRODUODENOSCOPY N/A 02/12/2024   Procedure: EGD (ESOPHAGOGASTRODUODENOSCOPY);  Surgeon: Unk Corinn Skiff, MD;  Location: [facility] ENDOSCOPY;  Service: Gastroenterology;  Laterality: N/A;  DM   INCISION AND DRAINAGE ABSCESS Right 08/16/2021   Procedure: INCISION AND DRAINAGE ABDOMINAL WALL ABSCESS;  Surgeon: Lane Shope, MD;  Location: [facility]  ORS;  Service: General;  Laterality: Right;   LAPAROSCOPIC TOTAL HYSTERECTOMY  11/2012   Social History[1] Family History  Problem Relation Age of Onset   Hypothyroidism Mother    Alzheimer's disease Mother    Stroke Father    Deep vein thrombosis Sister    Colon cancer Neg Hx    Esophageal cancer Neg Hx    Stomach cancer Neg Hx    Rectal cancer Neg Hx    Breast cancer Neg Hx    Allergies[2] Medications Ordered Prior to Encounter[3]  Review of Systems  Constitutional:  Negative for activity change, appetite change, fatigue, fever and unexpected weight change.  HENT:  Negative for congestion, ear pain, rhinorrhea, sinus pressure and sore throat.   Eyes:  Negative for pain, redness and visual disturbance.  Respiratory:  Negative for cough, shortness of breath and wheezing.   Cardiovascular:  Negative for chest  pain and palpitations.  Gastrointestinal:  Negative for abdominal pain, blood in stool, constipation and diarrhea.  Endocrine: Negative for polydipsia and polyuria.  Genitourinary:  Negative for dysuria, frequency and urgency.  Musculoskeletal:  Negative for arthralgias, back pain and myalgias.  Skin:  Negative for pallor and rash.  Allergic/Immunologic: Negative for environmental allergies.  Neurological:  Negative for dizziness, syncope and headaches.       Dizziness is better   Hematological:  Negative for adenopathy. Does not bruise/bleed easily.  Psychiatric/Behavioral:  Negative for decreased concentration and dysphoric mood. The patient is not nervous/anxious.        Objective:   Physical Exam Constitutional:      General: She is not in acute distress.    Appearance: Normal appearance. She is well-developed. She is obese. She is not ill-appearing or diaphoretic.  HENT:     Head: Normocephalic and atraumatic.  Eyes:     Conjunctiva/sclera: Conjunctivae normal.     Pupils: Pupils are equal, round, and reactive to light.  Neck:     Thyroid : No thyromegaly.      Vascular: No carotid bruit or JVD.  Cardiovascular:     Rate and Rhythm: Normal rate and regular rhythm.     Heart sounds: Normal heart sounds.     No gallop.  Pulmonary:     Effort: Pulmonary effort is normal. No respiratory distress.     Breath sounds: Normal breath sounds. No wheezing or rales.  Abdominal:     General: There is no distension or abdominal bruit.     Palpations: Abdomen is soft.  Musculoskeletal:     Cervical back: Normal range of motion and neck supple.     Right lower leg: No edema.     Left lower leg: No edema.  Lymphadenopathy:     Cervical: No cervical adenopathy.  Skin:    General: Skin is warm and dry.     Coloration: Skin is not pale.     Findings: No rash.  Neurological:     Mental Status: She is alert.     Coordination: Coordination normal.     Deep Tendon Reflexes: Reflexes are normal and symmetric. Reflexes normal.  Psychiatric:        Mood and Affect: Mood normal.           Assessment & Plan:   Problem List Items Addressed This Visit       Cardiovascular and Mediastinum   Essential hypertension   bp in fair control at this time  BP Readings from Last 1 Encounters:  07/25/24 120/78   No changes needed Most recent labs reviewed  Disc lifstyle change with low sodium diet and exercise  Plan to continue losartan  50 mg daily         Relevant Orders   Basic metabolic panel with GFR   Lipid panel     Digestive   NASH (nonalcoholic steatohepatitis)   Under GI care  With cirrhosis  Has us  upcoming  Next follow up in summer   Per last GI note: Compensated NASH cirrhosis of liver There are no signs or symptoms of decompensation Continue low-sodium diet Recommend right upper quadrant ultrasound for HCC surveillance in 07/2024 and every 6 months EGD revealed no evidence of varices or portal hypertensive gastropathy Has mild thrombocytopenia, no evidence of anemia PSC: None HRS: None      Cirrhosis of liver without ascites,  unspecified hepatic cirrhosis type (HCC)   Pt has NASH Under GI care  Continues to work on [employer] loss  Planning next US , then follow up with GI in summer         Endocrine   Hypothyroidism (acquired)   Per pt levothyroxine  dose reduced to 88 mcg by Dr Trixie recently  No clinical change       Hyperlipidemia associated with type 2 diabetes mellitus (HCC)   Lab today  Disc goals for lipids and reasons to control them Rev last labs with pt Rev low sat fat diet in detail  Atorvastatin  40 mg daily  Diet is improving  Last LDL under 70/ last was 64   Lab today       Relevant Orders   Lipid panel   DM type 2 (diabetes mellitus, type 2) (HCC) - Primary   Lab Results  Component Value Date   HGBA1C 6.8 (A) 07/25/2024   HGBA1C 6.7 (A) 03/30/2024   HGBA1C 7.2 (A) 12/29/2023   Stable Metformin  1000 mg bid Jardiance  10 mg daily  Trulicity  1.5 mg weeky and tolerating  Will schedule her next eye exam   Follow up 3 mo (annual exam)       Relevant Orders   POCT glycosylated hemoglobin (Hb A1C) (Completed)     Other   Splenomegaly   Due to NASH cirrhosis  Following with GI   For US  soon      Long-term (current) use of injectable non-insulin  antidiabetic drugs   Tolerating trulicity  1.5 mg weekly      Class 1 obesity due to excess calories in adult   Discussed how this problem influences overall health and the risks it imposes  Reviewed plan for weight loss with lower calorie diet (via better food choices (lower glycemic and portion control) along with exercise building up to or more than 30 minutes 5 days per week including some aerobic activity and strength training   Taking GLP-1 for DM2 Diet has improved significantly  Has NASH Continues to work on weight loss          [1]  Social History Tobacco Use   Smoking status: Former    Current packs/day: 0.00    Types: Cigarettes    Quit date: 07/22/1979    Years since quitting: 45.0    Passive exposure: Never    Smokeless tobacco: Never   Tobacco comments:    briefly smoked at 16.  (May occasionally have 1 cig)  Vaping Use   Vaping status: Never Used  Substance Use Topics   Alcohol use: Not Currently    Comment: rarely   Drug use: No  [2] No Known Allergies [3]  Current Outpatient Medications on File Prior to Visit  Medication Sig Dispense Refill   Accu-Chek Softclix Lancets lancets USE AS INSTRUCTED TO TEST BLOOD SUGAR ONE TIME DAILY 100 each 3   Alcohol Swabs (DROPSAFE ALCOHOL PREP) 70 % PADS USE WHEN CHECKING BLOOD SUGAR ONCE DAILY 100 each 3   atorvastatin  (LIPITOR) 40 MG tablet TAKE 1 TABLET EVERY DAY 90 tablet 0   Blood Glucose Monitoring Suppl (ACCU-CHEK GUIDE) w/Device KIT Use to check blood sugar once daily (dx. E11.9) 1 kit 0   clotrimazole  (LOTRIMIN ) 1 % cream APPLY 1 APPLICATION. TOPICALLY 2 (TWO) TIMES DAILY. 30 g 0   dicyclomine  (BENTYL ) 20 MG tablet Take 1 tablet (20 mg total) by mouth every 6 (six) hours. 20 tablet 0   Dulaglutide  (TRULICITY ) 1.5 MG/0.5ML SOAJ INJECT 1.5MG  (1 PEN) UNDER THE SKIN EVERY WEEK AS DIRECTED 6 mL 1  glucose blood (ACCU-CHEK GUIDE TEST) test strip USE AS INSTRUCTED TO TEST BLOOD SUGAR ONE TIME DAILY 100 strip 3   JARDIANCE  10 MG TABS tablet TAKE 1 TABLET EVERY DAY 90 tablet 3   levothyroxine  (SYNTHROID ) 88 MCG tablet Take 1 tablet (88 mcg total) by mouth daily. 90 tablet 3   losartan  (COZAAR ) 100 MG tablet TAKE 1/2 TABLET EVERY DAY 45 tablet 0   metFORMIN  (GLUCOPHAGE ) 1000 MG tablet TAKE 1 TABLET TWICE DAILY WITH FOOD 180 tablet 0   No current facility-administered medications on file prior to visit.   "

## 2024-07-25 NOTE — Assessment & Plan Note (Signed)
 Pt has NASH Under GI care  Continues to work on weight loss  Planning next US , then follow up with GI in summer

## 2024-07-25 NOTE — Assessment & Plan Note (Signed)
 Due to NASH cirrhosis  Following with GI   For US  soon

## 2024-07-25 NOTE — Assessment & Plan Note (Signed)
 Discussed how this problem influences overall health and the risks it imposes  Reviewed plan for weight loss with lower calorie diet (via better food choices (lower glycemic and portion control) along with exercise building up to or more than 30 minutes 5 days per week including some aerobic activity and strength training   Taking GLP-1 for DM2 Diet has improved significantly  Has NASH Continues to work on weight loss

## 2024-07-25 NOTE — Assessment & Plan Note (Signed)
 Per pt levothyroxine  dose reduced to 88 mcg by Dr Trixie recently  No clinical change

## 2024-07-25 NOTE — Assessment & Plan Note (Signed)
 Tolerating trulicity  1.5 mg weekly

## 2024-08-02 ENCOUNTER — Other Ambulatory Visit: Payer: Self-pay | Admitting: Gastroenterology

## 2024-08-02 ENCOUNTER — Encounter: Payer: Self-pay | Admitting: Gastroenterology

## 2024-08-02 DIAGNOSIS — K7469 Other cirrhosis of liver: Secondary | ICD-10-CM

## 2024-08-10 ENCOUNTER — Ambulatory Visit
Admission: RE | Admit: 2024-08-10 | Discharge: 2024-08-10 | Disposition: A | Discharge status: Home / Self Care | Source: Ambulatory Visit | Attending: Gastroenterology | Admitting: Gastroenterology

## 2024-08-10 DIAGNOSIS — K7469 Other cirrhosis of liver: Secondary | ICD-10-CM | POA: Insufficient documentation

## 2024-08-10 DIAGNOSIS — K7581 Nonalcoholic steatohepatitis (NASH): Secondary | ICD-10-CM | POA: Diagnosis present

## 2024-08-11 ENCOUNTER — Ambulatory Visit: Payer: Self-pay | Admitting: Gastroenterology

## 2024-08-18 ENCOUNTER — Encounter: Admitting: Family Medicine

## 2024-08-30 ENCOUNTER — Encounter: Admitting: Family Medicine

## 2024-10-13 ENCOUNTER — Ambulatory Visit: Admitting: Podiatry

## 2024-10-25 ENCOUNTER — Encounter: Admitting: Family Medicine

## 2025-07-03 ENCOUNTER — Ambulatory Visit: Admitting: Internal Medicine
